# Patient Record
Sex: Female | Born: 1945
Health system: Southern US, Community
[De-identification: ages and names within clinical notes are randomized; demographics above are authoritative.]

## PROBLEM LIST (undated history)

## (undated) DIAGNOSIS — I10 Essential (primary) hypertension: Secondary | ICD-10-CM

## (undated) DIAGNOSIS — E119 Type 2 diabetes mellitus without complications: Secondary | ICD-10-CM

## (undated) DIAGNOSIS — R0600 Dyspnea, unspecified: Secondary | ICD-10-CM

## (undated) HISTORY — PX: NO PAST SURGERIES: SHX2092

---

## 1999-06-23 ENCOUNTER — Emergency Department (HOSPITAL_COMMUNITY): Admission: EM | Admit: 1999-06-23 | Discharge: 1999-06-23 | Payer: Self-pay | Admitting: Emergency Medicine

## 1999-06-23 ENCOUNTER — Encounter: Payer: Self-pay | Admitting: Emergency Medicine

## 2006-10-07 ENCOUNTER — Emergency Department (HOSPITAL_COMMUNITY): Admission: EM | Admit: 2006-10-07 | Discharge: 2006-10-07 | Payer: Self-pay | Admitting: Emergency Medicine

## 2008-04-07 ENCOUNTER — Observation Stay (HOSPITAL_COMMUNITY): Admission: EM | Admit: 2008-04-07 | Discharge: 2008-04-08 | Payer: Self-pay | Admitting: Family Medicine

## 2008-04-07 ENCOUNTER — Ambulatory Visit: Payer: Self-pay | Admitting: Family Medicine

## 2008-04-07 LAB — CONVERTED CEMR LAB
Cholesterol: 151 mg/dL
HDL: 24 mg/dL
Hgb A1c MFr Bld: 13.8 %
LDL Cholesterol: 94 mg/dL
Triglyceride fasting, serum: 165 mg/dL

## 2008-04-17 ENCOUNTER — Encounter: Payer: Self-pay | Admitting: Family Medicine

## 2008-04-17 ENCOUNTER — Ambulatory Visit: Payer: Self-pay | Admitting: Family Medicine

## 2008-04-17 ENCOUNTER — Encounter: Payer: Self-pay | Admitting: *Deleted

## 2008-04-17 DIAGNOSIS — F17200 Nicotine dependence, unspecified, uncomplicated: Secondary | ICD-10-CM | POA: Insufficient documentation

## 2008-04-17 DIAGNOSIS — E119 Type 2 diabetes mellitus without complications: Secondary | ICD-10-CM | POA: Insufficient documentation

## 2008-04-19 ENCOUNTER — Telehealth: Payer: Self-pay | Admitting: Family Medicine

## 2008-05-08 ENCOUNTER — Ambulatory Visit: Payer: Self-pay | Admitting: Family Medicine

## 2008-05-08 LAB — CONVERTED CEMR LAB: Microalbumin U total vol: NEGATIVE mg/L

## 2010-03-23 ENCOUNTER — Encounter: Payer: Self-pay | Admitting: Family Medicine

## 2010-11-12 NOTE — Miscellaneous (Signed)
Summary: chart update  Clinical Lists Changes  Problems: Removed problem of DISTURBANCE OF SKIN SENSATION (ICD-782.0) Removed problem of BLURRED VISION (ICD-368.8) Observations: Added new observation of PRIMARY MD: Myrtie Soman  MD (03/23/2010 12:28)

## 2011-02-23 NOTE — H&P (Signed)
[name], [name]              ACCOUNT NO.:  [id number]   MEDICAL RECORD NO.:  [id number]          PATIENT TYPE:  OBV   LOCATION:  5029                         FACILITY:  [facility]   PHYSICIAN:  Nestor Ramp, MD        DATE OF BIRTH:  09/26/1946   DATE OF ADMISSION:  04/07/2008  DATE OF DISCHARGE:                              HISTORY & PHYSICAL   PRIMARY CARE PHYSICIAN:  None.   CHIEF COMPLAINT:  Increased thirst, high sugars.   HISTORY OF PRESENT ILLNESS:  This is a 65 year old female with no known  medical history who presents with increased thirstiness and dry mouth  for the past couple of days.  The patient said she was at a family  reunion and her sister checked her blood sugar and it was too high to  read.  Therefore, she came to the emergency room.  Of note, over the  past couple of days to weeks, the patient has not been eating well  because she said everything tastes starchy.  She said all her food and  drink tastes the same.  She has also had a burning sensation in her  mouth.  She had increased urination going about every 30 minutes.  She  also complains of blurry vision this week where her reading glasses  actually made the blurry vision worse.  She denies abdominal pain.  Denies constipation.  Denies nausea, although she did vomit a few days  ago, but this is now resolved.   PAST MEDICAL HISTORY:  None.   ALLERGIES:  No known drug allergies.   MEDICATIONS:  None.   SOCIAL HISTORY:  The patient lives in [place] with her son.  She  works at [employer] on [address].  She smokes half a pack a day.  She drinks  occasional alcohol, last 2 days ago.  She denies drug use.   FAMILY HISTORY:  She said her brothers and sisters have diabetes.  She  is not sure if her parents have diabetes.  Her father has hypertension  and mild heart problems.   REVIEW OF SYSTEMS:  As in HPI with the following additions.  Denies  fevers, nausea, or sore throat.  There is no chest pain,  edema,  palpitations, dyspnea, wheezing, diarrhea, bright red blood per rectum,  melena, dysuria, rash, swelling, and numbness.  Does complain of  appetite changes,  fatigue, headache, a sore esophagus, cough, vomiting,  and visual changes.  No abdominal pain.   PHYSICAL EXAMINATION:  VITAL SIGNS:  Temperature 98.2, heart rate 63-95,  respiratory rate 20, blood pressure 140/86 and 121/71 postop, and 100%  on room air.  GENERAL:  Not in acute distress, well appearing.  HEENT:  Pupils equal, round, and reactive to light and accommodation,  positive arcus senilis, extraocular muscles intact, mucous membranes are  somewhat dry.  She has no erythema over throat.  NECK:  Negative JVD.  Negative bruits.  Negative masses.  CARDIOVASCULAR:  Regular rate and rhythm.  No rubs, gallop, or murmurs.  LUNGS:  Clear to auscultation bilaterally.  She has the patient's please  add the  ABDOMEN:  Soft, nontender, obese, and normal bowel sounds.  BACK:  Nontender.  EXTREMITIES:  No edema.  A 2+ DP pulses bilaterally.  NEURO:  Cranial nerves II through XII intact, oriented x3, and  cerebellar function intact.  MUSCULOSKELETAL:  Normal range of movement.  No effusions.  SKIN:  No lesions.   LABS AND STUDIES:  iSTAT shows sodium 127, potassium 5.2, chloride 93,  bicarb 28, BUN 19, creatinine 1.4, and glucose greater than 700 with an  anion gap of 8.  Repeat BMET shows sodium 136, potassium 3.9, chloride  97, bicarb 27, BUN 14, creatinine 1.07, and glucose 444 with an anion  gap of 12.  Urinalysis shows 15 ketones greater than 1000 glucose.  Specific gravity less than 1.005.  No nitrite.  No leucocytes.  EKG  shows normal sinus rhythm with inverted T-waves in V1 only.   ASSESSMENT:  A 65 year old female with hyperglycemia.  1. Hyperglycemia:  This is a new diagnosis of diabetes and this is      based on random glucose greater than 200.  She does not have      acidosis and has a normal anion gap which  indicates this is likely      a chronic problem.  Her capillary blood gas has already decreased      significantly about 300 points on intravenous fluids and the      Glucommander while in the emergency room.  Given the patient is      insulin naive, i will discontinue the glucose stabilizer 2 hours      after giving her 15 units of Lantus.  Hopefully, the patient can      transition the p.o. metformin in the morning 500 b.i.d.  We will      cover her with sliding-scale insulin in the meantime.  Initially,      we will use D5 normal saline until the patient was taken p.o. and      we will switch to normal saline at 125 mL an hour.  If the patient      does require insulin, it would be about 0.5 units per kg given that      she is insulin naive.  We are going to risk stratify the patient on      a fasting lipid panel, q.2 h. capillary blood gases until stable.  2. Tobacco abuse:  The patient desires to have a negative patch 14 mg;      however, at this point she declined this.  3. Prophylaxis.  At prolonged hospital stay, we can start her on      proton pump inhibitor and place her on sequential compression      devices.   DISPOSITION:  Lately discharge within 24 hours if her CBGs are  controlled.       Johney Maine, M.D.  Electronically Signed      Nestor Ramp, MD  Electronically Signed    JT/MEDQ  D:  04/07/2008  T:  04/08/2008  Job:  [id number]

## 2011-02-23 NOTE — Discharge Summary (Signed)
Bianca [name], Bianca [name]              ACCOUNT NO.:  [id number]   MEDICAL RECORD NO.:  [id number]          PATIENT TYPE:  OBV   LOCATION:  5029                         FACILITY:  [facility]   PHYSICIAN:  Myrtie Soman, MD     DATE OF BIRTH:  01-14-1946   DATE OF ADMISSION:  04/07/2008  DATE OF DISCHARGE:  04/08/2008                               DISCHARGE SUMMARY   REASON FOR HOSPITALIZATION:  Hyperglycemia.   PRIMARY CARE Jessicalynn Deshong:  The patient has no primary care Micco Bourbeau.   DISCHARGE DIAGNOSES:  1. New diagnosis of diabetes.  2. Dyslipidemia.   DISCHARGE MEDICATIONS:  1. Metformin 500 mg p.o. b.i.d.  2. Amaryl 2 mg p.o. daily.   DISCHARGE INSTRUCTIONS:  1. The patient is to take medications as mentioned previously.  2. The patient is to follow up with diabetic teaching which she      received in the hospital.  3. The patient is to follow up with [facility] as a      new patient on April 17, 2008 with Dr. Rexene Alberts.  4. The patient is to follow up at [facility] as      arranged by diabetic teachers in the hospital.   CONSULTATIONS:  Diabetic teaching.   PROCEDURES:  None.   SIGNIFICANT FINDINGS ON ADMISSION:  The patient's glucose by i-STAT and  Chem-8 showed glucose greater than 771 and was low at 127, potassium  5.2, BUN of 19, creatinine 1.4, and chloride was 93.  Urine showed 15  ketones greater than 1000 of glucose and specific gravity less than  1.005.  Blood ketones performed on the day of admission were negative.  Repeat basic metabolic panel after initiation of anti-glycemic therapy  showed sodium of 136, potassium 3.9, chloride 97, CO2 27, glucose of  444, BUN of 14, and creatinine 1.07.  Lipid profile performed during  this hospitalization showed a total cholesterol of 151, triglycerides  165, HDL of 24, and LDL of 94.  Hemoglobin A1c performed during this  hospitalization was elevated to 13.8.  Basic metabolic panel performed  on the  date of discharge shows sodium of 136, potassium of 3.3, chloride  of 105, CO2 of 27, glucose of 96, BUN of 13, and creatinine 0.86.   BRIEF HOSPITAL COURSE:  Briefly, the patient is a 65 year old female  with no prior medical history, no primary care Laporchia Nakajima, and no medical  diagnoses who presented to the emergency department with polydipsia and  polyuria.  The patient had had a blood sugar measured by a friend at  home and found it to be elevated and subsequently presented to the  Emergency Department.  On evaluation at the Emergency Department,  glucose was found to be elevated at greater than 700, Glucommander  therapy was started in the Emergency Department under their care and  sugars decreased to 444 by the time the family practice teaching service  took over her care.  The patient was transitioned from Glucommander  insulin therapy to subcutaneous Lantus and oral anti-hyperglycemic  therapy with metformin 500 mg p.o. b.i.d.  She was  also started on a  sensitive sliding scale insulin as she is likely insulin naive.  The  patient had a good response in her blood sugars with incremental  decreasing of these values until the day of discharge, 1:00 a.m. glucose  was in the 90s.  At that time, the patient was given no further insulin  dosing.  She was tolerating oral intake without difficulty on hospital  day #2 and her  blood sugars rebounded from 92 to the 130s.  Her IV  fluids were discontinued and the patient was monitored further morning  and in the afternoon.  She had no further complications and stable vital  signs throughout her entire hospital course.  She had no abnormalities  in her bicarb and no indication of metabolic acidosis throughout her  entire hospital stay.  Given that the patient was tolerating oral intake  without difficulty and had stabilization of her blood sugars and had  been initiated on diabetic regimen.  She was discharged to home on April 08, 2008 with  close followup with [facility].  Prior to  discharge, the patient received diabetic teaching and further  consultation should have been set up with [facility]  subsequent to discharge.   DISCHARGE CONDITION:  Stable.   DISPOSITION:  The patient is discharged to home.   ISSUES FOR FOLLOWUP:  The patient's hemoglobin A1c was found to be 13 on  this hospitalization.  Adherence to diabetic regimen should be evaluated  and further teaching should be undertaken as indicated.  The patient's  fasting lipid panel was not markedly abnormal.  Given the new diagnosis  of diabetes, however, the patient's LDL goal is now less than 70.  Triglycerides were also found to be high and HDL was also found to be  low.  LDL should be pursued as the initial target goal with subsequent  alteration in triglycerides and HDL after this goal was reached.  Hemoglobin A1c should be monitored as an indication for efficacy  diabetic therapy.  Given the magnitude of elevation in glucose on  presentation is very likely that the patient will require insulin  therapy in the very near future.  This should be assessed at followup  and a low threshold for initiating insulin therapy should be held.  Further augmentation of complete diabetic regimen including  consideration of ACE inhibitor, anti-lipid therapy, and other modalities  should be considered, including diabetic foot testing.      Myrtie Soman, MD  Electronically Signed     TE/MEDQ  D:  04/08/2008  T:  04/09/2008  Job:  [id number]

## 2011-07-08 LAB — BASIC METABOLIC PANEL WITH GFR
BUN: 13
CO2: 27
Calcium: 8.3 — ABNORMAL LOW
Chloride: 105
Creatinine, Ser: 0.86
GFR calc non Af Amer: >60
Glucose, Bld: 96
Potassium: 3.3 — ABNORMAL LOW
Sodium: 136

## 2011-07-08 LAB — LIPID PANEL
HDL: 24 — ABNORMAL LOW
Total CHOL/HDL Ratio: 6.3
Triglycerides: 165 — ABNORMAL HIGH
VLDL: 33

## 2011-07-08 LAB — POCT URINALYSIS DIP (DEVICE)
Bilirubin Urine: NEGATIVE
Glucose, UA: >=1000 — AB
Hgb urine dipstick: NEGATIVE
Ketones, ur: 15 — AB
Nitrite: NEGATIVE
Specific Gravity, Urine: <=1.005

## 2011-07-08 LAB — POCT I-STAT, CHEM 8
Chloride: 93 — ABNORMAL LOW
Creatinine, Ser: 1.4 — ABNORMAL HIGH
Glucose, Bld: >700
HCT: 49 — ABNORMAL HIGH
Potassium: 5.2 — ABNORMAL HIGH

## 2011-07-08 LAB — HEMOGLOBIN A1C
Hgb A1c MFr Bld: 13.8 — ABNORMAL HIGH
Mean Plasma Glucose: 414

## 2011-07-08 LAB — BASIC METABOLIC PANEL
GFR calc non Af Amer: 52 — ABNORMAL LOW
Glucose, Bld: 444 — ABNORMAL HIGH
Potassium: 3.9
Sodium: 136

## 2011-11-03 DIAGNOSIS — J209 Acute bronchitis, unspecified: Secondary | ICD-10-CM | POA: Diagnosis not present

## 2011-11-03 DIAGNOSIS — E785 Hyperlipidemia, unspecified: Secondary | ICD-10-CM | POA: Diagnosis not present

## 2011-11-03 DIAGNOSIS — I10 Essential (primary) hypertension: Secondary | ICD-10-CM | POA: Diagnosis not present

## 2011-11-03 DIAGNOSIS — I509 Heart failure, unspecified: Secondary | ICD-10-CM | POA: Diagnosis not present

## 2012-12-03 DIAGNOSIS — R079 Chest pain, unspecified: Secondary | ICD-10-CM | POA: Diagnosis not present

## 2013-02-05 DIAGNOSIS — E119 Type 2 diabetes mellitus without complications: Secondary | ICD-10-CM | POA: Diagnosis not present

## 2013-02-05 DIAGNOSIS — R5381 Other malaise: Secondary | ICD-10-CM | POA: Diagnosis not present

## 2013-02-05 DIAGNOSIS — J039 Acute tonsillitis, unspecified: Secondary | ICD-10-CM | POA: Diagnosis not present

## 2013-02-05 DIAGNOSIS — I10 Essential (primary) hypertension: Secondary | ICD-10-CM | POA: Diagnosis not present

## 2013-02-06 DIAGNOSIS — E119 Type 2 diabetes mellitus without complications: Secondary | ICD-10-CM | POA: Diagnosis not present

## 2013-02-06 DIAGNOSIS — R5383 Other fatigue: Secondary | ICD-10-CM | POA: Diagnosis not present

## 2013-02-06 DIAGNOSIS — I10 Essential (primary) hypertension: Secondary | ICD-10-CM | POA: Diagnosis not present

## 2013-02-06 DIAGNOSIS — E785 Hyperlipidemia, unspecified: Secondary | ICD-10-CM | POA: Diagnosis not present

## 2013-02-06 DIAGNOSIS — J029 Acute pharyngitis, unspecified: Secondary | ICD-10-CM | POA: Diagnosis not present

## 2013-06-14 DIAGNOSIS — E119 Type 2 diabetes mellitus without complications: Secondary | ICD-10-CM | POA: Diagnosis not present

## 2013-06-14 DIAGNOSIS — I10 Essential (primary) hypertension: Secondary | ICD-10-CM | POA: Diagnosis not present

## 2014-01-18 ENCOUNTER — Encounter (HOSPITAL_BASED_OUTPATIENT_CLINIC_OR_DEPARTMENT_OTHER): Payer: Self-pay | Admitting: Emergency Medicine

## 2014-01-18 ENCOUNTER — Emergency Department (HOSPITAL_BASED_OUTPATIENT_CLINIC_OR_DEPARTMENT_OTHER)
Admission: EM | Admit: 2014-01-18 | Discharge: 2014-01-18 | Disposition: A | Discharge status: Home / Self Care | Payer: Medicare Other | Attending: Emergency Medicine | Admitting: Emergency Medicine

## 2014-01-18 DIAGNOSIS — Z7982 Long term (current) use of aspirin: Secondary | ICD-10-CM | POA: Diagnosis not present

## 2014-01-18 DIAGNOSIS — B3789 Other sites of candidiasis: Secondary | ICD-10-CM | POA: Insufficient documentation

## 2014-01-18 DIAGNOSIS — F17200 Nicotine dependence, unspecified, uncomplicated: Secondary | ICD-10-CM | POA: Insufficient documentation

## 2014-01-18 DIAGNOSIS — I10 Essential (primary) hypertension: Secondary | ICD-10-CM | POA: Insufficient documentation

## 2014-01-18 DIAGNOSIS — Z79899 Other long term (current) drug therapy: Secondary | ICD-10-CM | POA: Diagnosis not present

## 2014-01-18 DIAGNOSIS — B356 Tinea cruris: Secondary | ICD-10-CM | POA: Diagnosis not present

## 2014-01-18 DIAGNOSIS — E119 Type 2 diabetes mellitus without complications: Secondary | ICD-10-CM | POA: Insufficient documentation

## 2014-01-18 HISTORY — DX: Type 2 diabetes mellitus without complications: E11.9

## 2014-01-18 HISTORY — DX: Essential (primary) hypertension: I10

## 2014-01-18 LAB — CBG MONITORING, ED: GLUCOSE-CAPILLARY: 245 mg/dL — AB (ref 70–99)

## 2014-01-18 MED ORDER — FREESTYLE SYSTEM KIT: 1.0000 | PACK | Freq: Two times a day (BID) | Status: DC

## 2014-01-18 MED ORDER — CLOTRIMAZOLE 1 % EX CREA: TOPICAL_CREAM | CUTANEOUS | Status: DC

## 2014-01-18 NOTE — ED Provider Notes (Signed)
Medical screening examination/treatment/procedure(s) were performed by non-physician practitioner and as supervising physician I was immediately available for consultation/collaboration.   Celene KrasJon R Calum Cormier, MD 01/18/14 [phone]

## 2014-01-18 NOTE — ED Notes (Signed)
Rash on her vagina. Hx of same.

## 2014-01-18 NOTE — ED Provider Notes (Signed)
CSN: [id number]     Arrival date & time 01/18/14  1754 History   First MD Initiated Contact with Patient 01/18/14 1821     Chief Complaint  Patient presents with  . Rash     (Consider location/radiation/quality/duration/timing/severity/associated sxs/prior Treatment) Patient is a 68 y.o. female presenting with rash. The history is provided by the patient. No language interpreter was used.  Rash Location:  Ano-genital Ano-genital rash location:  Vagina Quality: itchiness and scaling   Severity:  Moderate Associated symptoms: no abdominal pain, no fever and no myalgias   Associated symptoms comment:  Recurrent rash, present most recently for the past 3 months. Getting worse. It causes itching. It is limited to lower abdominal and groin area.    Past Medical History  Diagnosis Date  . Diabetes mellitus without complication   . Hypertension    History reviewed. No pertinent past surgical history. No family history on file. History  Substance Use Topics  . Smoking status: Current Every Day Smoker -- 0.50 packs/day    Types: Cigarettes  . Smokeless tobacco: Not on file  . Alcohol Use: No   OB History   Grav Para Term Preterm Abortions TAB SAB Ect Mult Living                 Review of Systems  Constitutional: Negative for fever.  Gastrointestinal: Negative for abdominal pain.  Musculoskeletal: Negative for myalgias.  Skin: Positive for rash.      Allergies  Review of patient's allergies indicates no known allergies.  Home Medications   Current Outpatient Rx  Name  Route  Sig  Dispense  Refill  . aspirin 81 MG EC tablet   Oral   Take 81 mg by mouth daily.           Marland Kitchen. glimepiride (AMARYL) 2 MG tablet   Oral   Take 2 mg by mouth daily.           . metFORMIN (GLUCOPHAGE) 1000 MG tablet   Oral   Take 1,000 mg by mouth 2 (two) times daily.            BP 137/75  Pulse 87  Temp(Src) 98.5 F (36.9 C) (Oral)  Resp 20  Ht 5\' 6"  (1.676 m)  Wt 156 lb 8 oz  (70.988 kg)  BMI 25.27 kg/m2  SpO2 99% Physical Exam  Constitutional: She is oriented to person, place, and time. She appears well-developed and well-nourished.  Neck: Normal range of motion.  Pulmonary/Chest: Effort normal.  Neurological: She is alert and oriented to person, place, and time.  Skin: Skin is warm and dry.  Plaque type rash in inguinal folds bilateral groin. No weeping, redness or purulence.     ED Course  Procedures (including critical care time) Labs Review Labs Reviewed  CBG MONITORING, ED   Imaging Review No results found.   EKG Interpretation None      MDM   Final diagnoses:  None    1. Candidal rash, groin  Uncomplicated recurrent yeast rash.     Arnoldo HookerShari A Kashus Karlen, PA-C 01/18/14 1849

## 2014-01-18 NOTE — Discharge Instructions (Signed)
Candida Infection, Adult A candida infection (also called yeast, fungus and Monilia infection) is an overgrowth of yeast that can occur anywhere on the body. A yeast infection commonly occurs in warm, moist body areas. Usually, the infection remains localized but can spread to become a systemic infection. A yeast infection may be a sign of a more severe disease such as diabetes, leukemia, or AIDS. A yeast infection can occur in both men and women. In women, Candida vaginitis is a vaginal infection. It is one of the most common causes of vaginitis. Men usually do not have symptoms or know they have an infection until other problems develop. Men may find out they have a yeast infection because their sex partner has a yeast infection. Uncircumcised men are more likely to get a yeast infection than circumcised men. This is because the uncircumcised glans is not exposed to air and does not remain as dry as that of a circumcised glans. Older adults may develop yeast infections around dentures. CAUSES  Women  Antibiotics.  Steroid medication taken for a long time.  Being overweight (obese).  Diabetes.  Poor immune condition.  Certain serious medical conditions.  Immune suppressive medications for organ transplant patients.  Chemotherapy.  Pregnancy.  Menstration.  Stress and fatigue.  Intravenous drug use.  Oral contraceptives.  Wearing tight-fitting clothes in the crotch area.  Catching it from a sex partner who has a yeast infection.  Spermicide.  Intravenous, urinary, or other catheters. Men  Catching it from a sex partner who has a yeast infection.  Having oral or anal sex with a person who has the infection.  Spermicide.  Diabetes.  Antibiotics.  Poor immune system.  Medications that suppress the immune system.  Intravenous drug use.  Intravenous, urinary, or other catheters. SYMPTOMS  Women  Thick, white vaginal discharge.  Vaginal itching.  Redness and  swelling in and around the vagina.  Irritation of the lips of the vagina and perineum.  Blisters on the vaginal lips and perineum.  Painful sexual intercourse.  Low blood sugar (hypoglycemia).  Painful urination.  Bladder infections.  Intestinal problems such as constipation, indigestion, bad breath, bloating, increase in gas, diarrhea, or loose stools. Men  Men may develop intestinal problems such as constipation, indigestion, bad breath, bloating, increase in gas, diarrhea, or loose stools.  Dry, cracked skin on the penis with itching or discomfort.  Jock itch.  Dry, flaky skin.  Athlete's foot.  Hypoglycemia. DIAGNOSIS  Women  A history and an exam are performed.  The discharge may be examined under a microscope.  A culture may be taken of the discharge. Men  A history and an exam are performed.  Any discharge from the penis or areas of cracked skin will be looked at under the microscope and cultured.  Stool samples may be cultured. TREATMENT  Women  Vaginal antifungal suppositories and creams.  Medicated creams to decrease irritation and itching on the outside of the vagina.  Warm compresses to the perineal area to decrease swelling and discomfort.  Oral antifungal medications.  Medicated vaginal suppositories or cream for repeated or recurrent infections.  Wash and dry the irritation areas before applying the cream.  Eating yogurt with lactobacillus may help with prevention and treatment.  Sometimes painting the vagina with gentian violet solution may help if creams and suppositories do not work. Men  Antifungal creams and oral antifungal medications.  Sometimes treatment must continue for 30 days after the symptoms go away to prevent recurrence. HOME CARE   INSTRUCTIONS  Women  Use cotton underwear and avoid tight-fitting clothing.  Avoid colored, scented toilet paper and deodorant tampons or pads.  Do not douche.  Keep your diabetes  under control.  Finish all the prescribed medications.  Keep your skin clean and dry.  Consume milk or yogurt with lactobacillus active culture regularly. If you get frequent yeast infections and think that is what the infection is, there are over-the-counter medications that you can get. If the infection does not show healing in 3 days, talk to your caregiver.  Tell your sex partner you have a yeast infection. Your partner may need treatment also, especially if your infection does not clear up or recurs. Men  Keep your skin clean and dry.  Keep your diabetes under control.  Finish all prescribed medications.  Tell your sex partner that you have a yeast infection so they can be treated if necessary. SEEK MEDICAL CARE IF:   Your symptoms do not clear up or worsen in one week after treatment.  You have an oral temperature above 102 F (38.9 C).  You have trouble swallowing or eating for a prolonged time.  You develop blisters on and around your vagina.  You develop vaginal bleeding and it is not your menstrual period.  You develop abdominal pain.  You develop intestinal problems as mentioned above.  You get weak or lightheaded.  You have painful or increased urination.  You have pain during sexual intercourse. MAKE SURE YOU:   Understand these instructions.  Will watch your condition.  Will get help right away if you are not doing well or get worse. Document Released: 11/04/2004 Document Revised: 12/20/2011 Document Reviewed: 02/16/2010 [employer] Patient Information 2014 [employer].  

## 2014-03-26 DIAGNOSIS — B3731 Acute candidiasis of vulva and vagina: Secondary | ICD-10-CM | POA: Diagnosis not present

## 2014-03-26 DIAGNOSIS — B373 Candidiasis of vulva and vagina: Secondary | ICD-10-CM | POA: Diagnosis not present

## 2014-03-26 DIAGNOSIS — J209 Acute bronchitis, unspecified: Secondary | ICD-10-CM | POA: Diagnosis not present

## 2014-03-26 DIAGNOSIS — I10 Essential (primary) hypertension: Secondary | ICD-10-CM | POA: Diagnosis not present

## 2014-03-26 DIAGNOSIS — E119 Type 2 diabetes mellitus without complications: Secondary | ICD-10-CM | POA: Diagnosis not present

## 2014-03-26 DIAGNOSIS — J449 Chronic obstructive pulmonary disease, unspecified: Secondary | ICD-10-CM | POA: Diagnosis not present

## 2014-12-27 ENCOUNTER — Encounter (HOSPITAL_COMMUNITY): Payer: Self-pay | Admitting: *Deleted

## 2014-12-27 ENCOUNTER — Emergency Department (HOSPITAL_COMMUNITY): Payer: Medicare Other

## 2014-12-27 ENCOUNTER — Emergency Department (HOSPITAL_COMMUNITY)
Admission: EM | Admit: 2014-12-27 | Discharge: 2014-12-27 | Disposition: A | Discharge status: Home / Self Care | Payer: Medicare Other | Attending: Emergency Medicine | Admitting: Emergency Medicine

## 2014-12-27 DIAGNOSIS — S92402A Displaced unspecified fracture of left great toe, initial encounter for closed fracture: Secondary | ICD-10-CM | POA: Diagnosis not present

## 2014-12-27 DIAGNOSIS — W228XXA Striking against or struck by other objects, initial encounter: Secondary | ICD-10-CM | POA: Diagnosis not present

## 2014-12-27 DIAGNOSIS — Y998 Other external cause status: Secondary | ICD-10-CM | POA: Diagnosis not present

## 2014-12-27 DIAGNOSIS — Z7982 Long term (current) use of aspirin: Secondary | ICD-10-CM | POA: Insufficient documentation

## 2014-12-27 DIAGNOSIS — S92415A Nondisplaced fracture of proximal phalanx of left great toe, initial encounter for closed fracture: Secondary | ICD-10-CM | POA: Diagnosis not present

## 2014-12-27 DIAGNOSIS — I10 Essential (primary) hypertension: Secondary | ICD-10-CM | POA: Diagnosis not present

## 2014-12-27 DIAGNOSIS — S92912A Unspecified fracture of left toe(s), initial encounter for closed fracture: Secondary | ICD-10-CM

## 2014-12-27 DIAGNOSIS — E119 Type 2 diabetes mellitus without complications: Secondary | ICD-10-CM | POA: Diagnosis not present

## 2014-12-27 DIAGNOSIS — Z79899 Other long term (current) drug therapy: Secondary | ICD-10-CM | POA: Diagnosis not present

## 2014-12-27 DIAGNOSIS — M79675 Pain in left toe(s): Secondary | ICD-10-CM | POA: Diagnosis not present

## 2014-12-27 DIAGNOSIS — Y9389 Activity, other specified: Secondary | ICD-10-CM | POA: Diagnosis not present

## 2014-12-27 DIAGNOSIS — Z720 Tobacco use: Secondary | ICD-10-CM | POA: Diagnosis not present

## 2014-12-27 DIAGNOSIS — S99922A Unspecified injury of left foot, initial encounter: Secondary | ICD-10-CM | POA: Diagnosis present

## 2014-12-27 DIAGNOSIS — Y9289 Other specified places as the place of occurrence of the external cause: Secondary | ICD-10-CM | POA: Insufficient documentation

## 2014-12-27 MED ORDER — HYDROCODONE-ACETAMINOPHEN 5-325 MG PO TABS: 1.0000 | ORAL_TABLET | ORAL | Status: DC | PRN

## 2014-12-27 MED ORDER — METFORMIN HCL 1000 MG PO TABS: 1000.0000 mg | ORAL_TABLET | Freq: Two times a day (BID) | ORAL | Status: DC

## 2014-12-27 NOTE — ED Notes (Signed)
thge pt is c/o lt great and 2nd toe pain since a roller skate rolled over it last pom.  She also wants a refill for her  Diabetic med

## 2014-12-27 NOTE — Discharge Instructions (Signed)

## 2014-12-27 NOTE — ED Provider Notes (Signed)
CSN: [id number]     Arrival date & time 12/27/14  2037 History   This chart was scribed for non-physician practitioner Lorre Munroe, PA-C working with Tanna Furry, MD by Hilda Lias, ED Scribe. This patient was seen in room TR05C/TR05C and the patient's care was started at 9:37 PM.    Chief Complaint  Patient presents with  . Foot Pain     The history is provided by the patient. No language interpreter was used.     HPI Comments: Bianca [name] is a 69 y.o. female with DM who presents to the Emergency Department complaining of constant left great and 2nd toe pain after her left foot was run over by a roller skate last night. Pt states that it was painful to ambulate immediately afterwards, and has been since the incident occurred. She has not taken anything to alleviate the symptoms.   Past Medical History  Diagnosis Date  . Diabetes mellitus without complication   . Hypertension    No past surgical history on file. No family history on file. History  Substance Use Topics  . Smoking status: Current Every Day Smoker -- 0.50 packs/day    Types: Cigarettes  . Smokeless tobacco: Not on file  . Alcohol Use: No   OB History    No data available     Review of Systems  Constitutional: Negative for fever and chills.  Respiratory: Negative for shortness of breath.   Cardiovascular: Negative for chest pain.  Gastrointestinal: Negative for nausea, vomiting, diarrhea and constipation.  Genitourinary: Negative for dysuria.  Musculoskeletal: Positive for arthralgias.      Allergies  Review of patient's allergies indicates no known allergies.  Home Medications   Prior to Admission medications   Medication Sig Start Date End Date Taking? Authorizing Provider  aspirin 81 MG EC tablet Take 81 mg by mouth daily.      Historical Provider, MD  clotrimazole (LOTRIMIN) 1 % cream Apply to affected area 2 times daily 01/18/14   Charlann Lange, PA-C  glimepiride (AMARYL) 2 MG tablet Take  2 mg by mouth daily.      Historical Provider, MD  glucose monitoring kit (FREESTYLE) monitoring kit 1 each by Does not apply route 2 (two) times daily. 01/18/14   Charlann Lange, PA-C  metFORMIN (GLUCOPHAGE) 1000 MG tablet Take 1,000 mg by mouth 2 (two) times daily.      Historical Provider, MD   BP 167/76 mmHg  Pulse 95  Temp(Src) 98.2 F (36.8 C)  Resp 14  SpO2 98% Physical Exam  Constitutional: She is oriented to person, place, and time. She appears well-developed and well-nourished.  HENT:  Head: Normocephalic and atraumatic.  Cardiovascular: Normal rate.   Pulmonary/Chest: Effort normal.  Abdominal: She exhibits no distension.  Musculoskeletal:  Left great toe moderately tender to palpation, range of motion strength 5/5, no bony abnormality or deformity, no open wound  Neurological: She is alert and oriented to person, place, and time.  Skin: Skin is warm and dry.  Psychiatric: She has a normal mood and affect.  Nursing note and vitals reviewed.   ED Course  Procedures (including critical care time)  DIAGNOSTIC STUDIES: Oxygen Saturation is 98% on RA, normal by my interpretation.    COORDINATION OF CARE: 9:40 PM Discussed treatment plan with pt at bedside and pt agreed to plan.   Labs Review Labs Reviewed - No data to display  Imaging Review Dg Foot Complete Left  12/27/2014   CLINICAL DATA:  Left  great toe pain, recent trauma.  EXAM: LEFT FOOT - COMPLETE 3+ VIEW  COMPARISON:  None.  FINDINGS: Subtle linear lucency at the base of the first proximal phalanx medially. No displaced fracture. No dislocation. Intact Lisfranc joint. Posterior calcaneal enthesophyte. No overt soft tissue swelling.  IMPRESSION: Subtle linear lucency through the base of the first proximal phalanx is favored to reflect projection artifact. Correlate for point tenderness as a nondisplaced fracture is not excluded.   Electronically Signed   By: Carlos Levering M.D.   On: 12/27/2014 21:32     EKG  Interpretation None      MDM   Final diagnoses:  Toe fracture, left, closed, initial encounter    Patient with suspected small nondisplaced great toe fracture. Will treat with postop shoe. Will treat with pain medicine. Recommend primary care and orthopedic follow-up. Patient will need close follow-up because she is diabetic. Patient understands and agrees to plan. She is stable and ready for discharge.  I personally performed the services described in this documentation, which was scribed in my presence. The recorded information has been reviewed and is accurate.     Montine Circle, PA-C 12/27/14 2149  Tanna Furry, MD 12/28/14 2220

## 2015-01-22 DIAGNOSIS — M79672 Pain in left foot: Secondary | ICD-10-CM | POA: Diagnosis not present

## 2015-01-30 ENCOUNTER — Emergency Department (HOSPITAL_COMMUNITY)
Admission: EM | Admit: 2015-01-30 | Discharge: 2015-01-31 | Disposition: A | Discharge status: Home / Self Care | Payer: Medicare Other | Attending: Emergency Medicine | Admitting: Emergency Medicine

## 2015-01-30 ENCOUNTER — Encounter (HOSPITAL_COMMUNITY): Payer: Self-pay | Admitting: Emergency Medicine

## 2015-01-30 DIAGNOSIS — S8011XA Contusion of right lower leg, initial encounter: Secondary | ICD-10-CM | POA: Insufficient documentation

## 2015-01-30 DIAGNOSIS — Z720 Tobacco use: Secondary | ICD-10-CM | POA: Diagnosis not present

## 2015-01-30 DIAGNOSIS — Y929 Unspecified place or not applicable: Secondary | ICD-10-CM | POA: Diagnosis not present

## 2015-01-30 DIAGNOSIS — Y939 Activity, unspecified: Secondary | ICD-10-CM | POA: Insufficient documentation

## 2015-01-30 DIAGNOSIS — W228XXA Striking against or struck by other objects, initial encounter: Secondary | ICD-10-CM | POA: Insufficient documentation

## 2015-01-30 DIAGNOSIS — Y999 Unspecified external cause status: Secondary | ICD-10-CM | POA: Insufficient documentation

## 2015-01-30 DIAGNOSIS — I10 Essential (primary) hypertension: Secondary | ICD-10-CM | POA: Diagnosis not present

## 2015-01-30 DIAGNOSIS — E119 Type 2 diabetes mellitus without complications: Secondary | ICD-10-CM | POA: Diagnosis not present

## 2015-01-30 DIAGNOSIS — S8991XA Unspecified injury of right lower leg, initial encounter: Secondary | ICD-10-CM | POA: Diagnosis present

## 2015-01-30 DIAGNOSIS — F17210 Nicotine dependence, cigarettes, uncomplicated: Secondary | ICD-10-CM | POA: Diagnosis not present

## 2015-01-30 DIAGNOSIS — Z79899 Other long term (current) drug therapy: Secondary | ICD-10-CM | POA: Diagnosis not present

## 2015-01-30 NOTE — ED Notes (Signed)
Pt. accidentally hit by a roller skate at her right shin this evening , presents with pain , mild swelling/bruise at right shin . Ambulatory / hypertensive at triage .

## 2015-01-31 MED ORDER — ASPIRIN 81 MG PO CHEW: 81.0000 mg | CHEWABLE_TABLET | Freq: Every day | ORAL | Status: DC

## 2015-01-31 MED ORDER — ADULT BLOOD PRESSURE CUFF LG KIT: PACK | Status: DC

## 2015-01-31 MED ORDER — TRAMADOL HCL 50 MG PO TABS
50.0000 mg | ORAL_TABLET | Freq: Once | ORAL | Status: AC
  Administered 2015-01-31: 50 mg via ORAL
  Filled 2015-01-31: qty 1

## 2015-01-31 MED ORDER — TRAMADOL HCL 50 MG PO TABS: 50.0000 mg | ORAL_TABLET | Freq: Four times a day (QID) | ORAL | Status: DC | PRN

## 2015-01-31 NOTE — ED Provider Notes (Signed)
CSN: [id number]     Arrival date & time 01/30/15  2304 History  This chart was scribed for Bianca Flemings, MD by Rayfield Citizen, ED Scribe. This patient was seen in room B19C/B19C and the patient's care was started at 1:01 AM.    Chief Complaint  Patient presents with  . Leg Pain   Patient is a 69 y.o. female presenting with leg pain. The history is provided by the patient. No language interpreter was used.  Leg Pain    HPI Comments: Bianca [name] is a 69 y.o. female with past medical history of DM (managed with Metformin), HTN who presents to the Emergency Department complaining of right shin pain. Patient explains she was bumped into by a child at a roller rink tonight; "He just skated by me and ran into my leg, now it's really sore." Patient states she was concerned due to her diabetic history; she worried that she might develop a fatal blood clot and wanted to be evaluated.   She reports that she was recently taking a medication for HTN but found during a recent visit to the ED that she had been placed on a "sugar pill," so she stopped taking it. She was previously on a baby aspirin regimen but stopped that as well.   PCP on [address]   Past Medical History  Diagnosis Date  . Diabetes mellitus without complication   . Hypertension    History reviewed. No pertinent past surgical history. No family history on file. History  Substance Use Topics  . Smoking status: Current Every Day Smoker -- 0.50 packs/day    Types: Cigarettes  . Smokeless tobacco: Not on file  . Alcohol Use: No   OB History    No data available     Review of Systems  Musculoskeletal:       Right shin pain  All other systems reviewed and are negative.  Allergies  Review of patient's allergies indicates no known allergies.  Home Medications   Prior to Admission medications   Medication Sig Start Date End Date Taking? Authorizing Provider  clotrimazole (LOTRIMIN) 1 % cream Apply to affected area 2  times daily Patient not taking: Reported on 12/27/2014 01/18/14   Charlann Lange, PA-C  glucose monitoring kit (FREESTYLE) monitoring kit 1 each by Does not apply route 2 (two) times daily. 01/18/14   Charlann Lange, PA-C  HYDROcodone-acetaminophen (NORCO/VICODIN) 5-325 MG per tablet Take 1 tablet by mouth every 4 (four) hours as needed. 12/27/14   Montine Circle, PA-C  metFORMIN (GLUCOPHAGE) 1000 MG tablet Take 1 tablet (1,000 mg total) by mouth 2 (two) times daily. 12/27/14   Montine Circle, PA-C   BP 184/86 mmHg  Pulse 95  Temp(Src) 98.5 F (36.9 C) (Oral)  Resp 14  Ht $R'5\' 6"'ry$  (1.676 m)  Wt 146 lb (66.225 kg)  BMI 23.58 kg/m2  SpO2 97% Physical Exam  Constitutional: She is oriented to person, place, and time. She appears well-developed and well-nourished. No distress.  HENT:  Head: Normocephalic and atraumatic.  Mouth/Throat: Oropharynx is clear and moist. No oropharyngeal exudate.  Moist mucous membranes  Eyes: EOM are normal. Pupils are equal, round, and reactive to light.  Neck: Normal range of motion. Neck supple. No JVD present.  Cardiovascular: Normal rate, regular rhythm and normal heart sounds.  Exam reveals no gallop and no friction rub.   No murmur heard. Pulmonary/Chest: Effort normal and breath sounds normal. No respiratory distress. She has no wheezes. She has no  rales.  Abdominal: Soft. Bowel sounds are normal. She exhibits no mass. There is no tenderness. There is no rebound and no guarding.  Musculoskeletal: Normal range of motion. She exhibits no edema.  Moves all extremities normally.   Contusion to right anterior shin  Lymphadenopathy:    She has no cervical adenopathy.  Neurological: She is alert and oriented to person, place, and time. She displays normal reflexes.  Skin: Skin is warm and dry. No rash noted.  Psychiatric: She has a normal mood and affect. Her behavior is normal.  Nursing note and vitals reviewed.   ED Course  Procedures   DIAGNOSTIC  STUDIES: Oxygen Saturation is 97% on RA, adequate by my interpretation.    COORDINATION OF CARE: 1:09 AM Discussed treatment plan with pt at bedside and pt agreed to plan.   Labs Review Labs Reviewed - No data to display  Imaging Review No results found.   EKG Interpretation None      MDM   Final diagnoses:  Contusion of right lower leg, initial encounter  Essential hypertension    I personally performed the services described in this documentation, which was scribed in my presence. The recorded information has been reviewed and is accurate.  69 year old female with contusion to right shin.  Patient also noted to have hypertension, has been told that she has this before, was on Amaryl which she thought was a blood pressure medicine.  Patient advised to follow-up with primary care Dr. for further workup of her blood pressure.        Bianca Flemings, MD 01/31/15 [phone]

## 2015-01-31 NOTE — Discharge Instructions (Signed)
Contusion A contusion is a deep bruise. Contusions are the result of an injury that caused bleeding under the skin. The contusion may turn blue, purple, or yellow. Minor injuries will give you a painless contusion, but more severe contusions may stay painful and swollen for a few weeks.  CAUSES  A contusion is usually caused by a blow, trauma, or direct force to an area of the body. SYMPTOMS   Swelling and redness of the injured area.  Bruising of the injured area.  Tenderness and soreness of the injured area.  Pain. DIAGNOSIS  The diagnosis can be made by taking a history and physical exam. An X-ray, CT scan, or MRI may be needed to determine if there were any associated injuries, such as fractures. TREATMENT  Specific treatment will depend on what area of the body was injured. In general, the best treatment for a contusion is resting, icing, elevating, and applying cold compresses to the injured area. Over-the-counter medicines may also be recommended for pain control. Ask your caregiver what the best treatment is for your contusion. HOME CARE INSTRUCTIONS   Put ice on the injured area.  Put ice in a plastic bag.  Place a towel between your skin and the bag.  Leave the ice on for 15-20 minutes, 3-4 times a day, or as directed by your health care provider.  Only take over-the-counter or prescription medicines for pain, discomfort, or fever as directed by your caregiver. Your caregiver may recommend avoiding anti-inflammatory medicines (aspirin, ibuprofen, and naproxen) for 48 hours because these medicines may increase bruising.  Rest the injured area.  If possible, elevate the injured area to reduce swelling. SEEK IMMEDIATE MEDICAL CARE IF:   You have increased bruising or swelling.  You have pain that is getting worse.  Your swelling or pain is not relieved with medicines. MAKE SURE YOU:   Understand these instructions.  Will watch your condition.  Will get help right  away if you are not doing well or get worse. Document Released: 07/07/2005 Document Revised: 10/02/2013 Document Reviewed: 08/02/2011 [facility] Patient Information 2015 [place]. This information is not intended to replace advice given to you by your health care provider. Make sure you discuss any questions you have with your health care provider.  Cryotherapy Cryotherapy means treatment with cold. Ice or gel packs can be used to reduce both pain and swelling. Ice is the most helpful within the first 24 to 48 hours after an injury or flare-up from overusing a muscle or joint. Sprains, strains, spasms, burning pain, shooting pain, and aches can all be eased with ice. Ice can also be used when recovering from surgery. Ice is effective, has very few side effects, and is safe for most people to use. PRECAUTIONS  Ice is not a safe treatment option for people with:  Raynaud phenomenon. This is a condition affecting small blood vessels in the extremities. Exposure to cold may cause your problems to return.  Cold hypersensitivity. There are many forms of cold hypersensitivity, including:  Cold urticaria. Red, itchy hives appear on the skin when the tissues begin to warm after being iced.  Cold erythema. This is a red, itchy rash caused by exposure to cold.  Cold hemoglobinuria. Red blood cells break down when the tissues begin to warm after being iced. The hemoglobin that carry oxygen are passed into the urine because they cannot combine with blood proteins fast enough.  Numbness or altered sensitivity in the area being iced. If you have any  of the following conditions, do not use ice until you have discussed cryotherapy with your caregiver:  Heart conditions, such as arrhythmia, angina, or chronic heart disease.  High blood pressure.  Healing wounds or open skin in the area being iced.  Current infections.  Rheumatoid arthritis.  Poor circulation.  Diabetes. Ice slows the blood flow  in the region it is applied. This is beneficial when trying to stop inflamed tissues from spreading irritating chemicals to surrounding tissues. However, if you expose your skin to cold temperatures for too long or without the proper protection, you can damage your skin or nerves. Watch for signs of skin damage due to cold. HOME CARE INSTRUCTIONS Follow these tips to use ice and cold packs safely.  Place a dry or damp towel between the ice and skin. A damp towel will cool the skin more quickly, so you may need to shorten the time that the ice is used.  For a more rapid response, add gentle compression to the ice.  Ice for no more than 10 to 20 minutes at a time. The bonier the area you are icing, the less time it will take to get the benefits of ice.  Check your skin after 5 minutes to make sure there are no signs of a poor response to cold or skin damage.  Rest 20 minutes or more between uses.  Once your skin is numb, you can end your treatment. You can test numbness by very lightly touching your skin. The touch should be so light that you do not see the skin dimple from the pressure of your fingertip. When using ice, most people will feel these normal sensations in this order: cold, burning, aching, and numbness.  Do not use ice on someone who cannot communicate their responses to pain, such as small children or people with dementia. HOW TO MAKE AN ICE PACK Ice packs are the most common way to use ice therapy. Other methods include ice massage, ice baths, and cryosprays. Muscle creams that cause a cold, tingly feeling do not offer the same benefits that ice offers and should not be used as a substitute unless recommended by your caregiver. To make an ice pack, do one of the following:  Place crushed ice or a bag of frozen vegetables in a sealable plastic bag. Squeeze out the excess air. Place this bag inside another plastic bag. Slide the bag into a pillowcase or place a damp towel between  your skin and the bag.  Mix 3 parts water with 1 part rubbing alcohol. Freeze the mixture in a sealable plastic bag. When you remove the mixture from the freezer, it will be slushy. Squeeze out the excess air. Place this bag inside another plastic bag. Slide the bag into a pillowcase or place a damp towel between your skin and the bag. SEEK MEDICAL CARE IF:  You develop white spots on your skin. This may give the skin a blotchy (mottled) appearance.  Your skin turns blue or pale.  Your skin becomes waxy or hard.  Your swelling gets worse. MAKE SURE YOU:   Understand these instructions.  Will watch your condition.  Will get help right away if you are not doing well or get worse. Document Released: 05/24/2011 Document Revised: 02/11/2014 Document Reviewed: 05/24/2011 [facility] Patient Information 2015 [place], [place]. This information is not intended to replace advice given to you by your health care provider. Make sure you discuss any questions you have with your health care provider.  How to Take Your Blood Pressure HOW DO I GET A BLOOD PRESSURE MACHINE?  You can buy an electronic home blood pressure machine at your local pharmacy. Insurance will sometimes cover the cost if you have a prescription.  Ask your doctor what type of machine is best for you. There are different machines for your arm and your wrist.  If you decide to buy a machine to check your blood pressure on your arm, first check the size of your arm so you can buy the right size cuff. To check the size of your arm:   Use a measuring tape that shows both inches and centimeters.   Wrap the measuring tape around the upper-middle part of your arm. You may need someone to help you measure.   Write down your arm measurement in both inches and centimeters.   To measure your blood pressure correctly, it is important to have the right size cuff.   If your arm is up to 13 inches (up to 34 centimeters), get an adult  cuff size.  If your arm is 13 to 17 inches (35 to 44 centimeters), get a large adult cuff size.    If your arm is 17 to 20 inches (45 to 52 centimeters), get an adult thigh cuff.  WHAT DO THE NUMBERS MEAN?   There are two numbers that make up your blood pressure. For example: 120/80.  The first number (120 in our example) is called the "systolic pressure." It is a measure of the pressure in your blood vessels when your heart is pumping blood.  The second number (80 in our example) is called the "diastolic pressure." It is a measure of the pressure in your blood vessels when your heart is resting between beats.  Your doctor will tell you what your blood pressure should be. WHAT SHOULD I DO BEFORE I CHECK MY BLOOD PRESSURE?   Try to rest or relax for at least 30 minutes before you check your blood pressure.  Do not smoke.  Do not have any drinks with caffeine, such as:  Soda.  Coffee.  Tea.  Check your blood pressure in a quiet room.  Sit down and stretch out your arm on a table. Keep your arm at about the level of your heart. Let your arm relax.  Make sure that your legs are not crossed. HOW DO I CHECK MY BLOOD PRESSURE?  Follow the directions that came with your machine.  Make sure you remove any tight-fighting clothing from your arm or wrist. Wrap the cuff around your upper arm or wrist. You should be able to fit a finger between the cuff and your arm. If you cannot fit a finger between the cuff and your arm, it is too tight and should be removed and rewrapped.  Some units require you to manually pump up the arm cuff.  Automatic units inflate the cuff when you press a button.  Cuff deflation is automatic in both models.  After the cuff is inflated, the unit measures your blood pressure and pulse. The readings are shown on a monitor. Hold still and breathe normally while the cuff is inflated.  Getting a reading takes less than a minute.  Some models store readings  in a memory. Some provide a printout of readings. If your machine does not store your readings, keep a written record.  Take readings with you to your next visit with your doctor. Document Released: 09/09/2008 Document Revised: 02/11/2014 Document Reviewed: 11/22/2013 ExitCare Patient  Information 2015 [place], [place]. This information is not intended to replace advice given to you by your health care provider. Make sure you discuss any questions you have with your health care provider.  Managing Your High Blood Pressure Blood pressure is a measurement of how forceful your blood is pressing against the walls of the arteries. Arteries are muscular tubes within the circulatory system. Blood pressure does not stay the same. Blood pressure rises when you are active, excited, or nervous; and it lowers during sleep and relaxation. If the numbers measuring your blood pressure stay above normal most of the time, you are at risk for health problems. High blood pressure (hypertension) is a long-term (chronic) condition in which blood pressure is elevated. A blood pressure reading is recorded as two numbers, such as 120 over 80 (or 120/80). The first, higher number is called the systolic pressure. It is a measure of the pressure in your arteries as the heart beats. The second, lower number is called the diastolic pressure. It is a measure of the pressure in your arteries as the heart relaxes between beats.  Keeping your blood pressure in a normal range is important to your overall health and prevention of health problems, such as heart disease and stroke. When your blood pressure is uncontrolled, your heart has to work harder than normal. High blood pressure is a very common condition in adults because blood pressure tends to rise with age. Men and women are equally likely to have hypertension but at different times in life. Before age 46, men are more likely to have hypertension. After 70 years of age, women are more  likely to have it. Hypertension is especially common in African Americans. This condition often has no signs or symptoms. The cause of the condition is usually not known. Your caregiver can help you come up with a plan to keep your blood pressure in a normal, healthy range. BLOOD PRESSURE STAGES Blood pressure is classified into four stages: normal, prehypertension, stage 1, and stage 2. Your blood pressure reading will be used to determine what type of treatment, if any, is necessary. Appropriate treatment options are tied to these four stages:  Normal  Systolic pressure (mm Hg): below 120.  Diastolic pressure (mm Hg): below 80. Prehypertension  Systolic pressure (mm Hg): 120 to 139.  Diastolic pressure (mm Hg): 80 to 89. Stage1  Systolic pressure (mm Hg): 140 to 159.  Diastolic pressure (mm Hg): 90 to 99. Stage2  Systolic pressure (mm Hg): 160 or above.  Diastolic pressure (mm Hg): 100 or above. RISKS RELATED TO HIGH BLOOD PRESSURE Managing your blood pressure is an important responsibility. Uncontrolled high blood pressure can lead to:  A heart attack.  A stroke.  A weakened blood vessel (aneurysm).  Heart failure.  Kidney damage.  Eye damage.  Metabolic syndrome.  Memory and concentration problems. HOW TO MANAGE YOUR BLOOD PRESSURE Blood pressure can be managed effectively with lifestyle changes and medicines (if needed). Your caregiver will help you come up with a plan to bring your blood pressure within a normal range. Your plan should include the following: Education  Read all information provided by your caregivers about how to control blood pressure.  Educate yourself on the latest guidelines and treatment recommendations. New research is always being done to further define the risks and treatments for high blood pressure. Lifestylechanges  Control your weight.  Avoid smoking.  Stay physically active.  Reduce the amount of salt in your  diet.  Reduce  stress.  Control any chronic conditions, such as high cholesterol or diabetes.  Reduce your alcohol intake. Medicines  Several medicines (antihypertensive medicines) are available, if needed, to bring blood pressure within a normal range. Communication  Review all the medicines you take with your caregiver because there may be side effects or interactions.  Talk with your caregiver about your diet, exercise habits, and other lifestyle factors that may be contributing to high blood pressure.  See your caregiver regularly. Your caregiver can help you create and adjust your plan for managing high blood pressure. RECOMMENDATIONS FOR TREATMENT AND FOLLOW-UP  The following recommendations are based on current guidelines for managing high blood pressure in nonpregnant adults. Use these recommendations to identify the proper follow-up period or treatment option based on your blood pressure reading. You can discuss these options with your caregiver.  Systolic pressure of 120 to 139 or diastolic pressure of 80 to 89: Follow up with your caregiver as directed.  Systolic pressure of 140 to 160 or diastolic pressure of 90 to 100: Follow up with your caregiver within 2 months.  Systolic pressure above 160 or diastolic pressure above 100: Follow up with your caregiver within 1 month.  Systolic pressure above 180 or diastolic pressure above 110: Consider antihypertensive therapy; follow up with your caregiver within 1 week.  Systolic pressure above 200 or diastolic pressure above 120: Begin antihypertensive therapy; follow up with your caregiver within 1 week. Document Released: 06/21/2012 Document Reviewed: 06/21/2012 [facility] Patient Information 2015 [place], [place]. This information is not intended to replace advice given to you by your health care provider. Make sure you discuss any questions you have with your health care provider.   Emergency Department Resource Guide 1) Find a  Doctor and Pay Out of Pocket Although you won't have to find out who is covered by your insurance plan, it is a good idea to ask around and get recommendations. You will then need to call the office and see if the doctor you have chosen will accept you as a new patient and what types of options they offer for patients who are self-pay. Some doctors offer discounts or will set up payment plans for their patients who do not have insurance, but you will need to ask so you aren't surprised when you get to your appointment.  2) Contact Your Local Health Department Not all health departments have doctors that can see patients for sick visits, but many do, so it is worth a call to see if yours does. If you don't know where your local health department is, you can check in your phone book. The CDC also has a tool to help you locate your state's health department, and many state websites also have listings of all of their local health departments.  3) Find a Walk-in Clinic If your illness is not likely to be very severe or complicated, you may want to try a walk in clinic. These are popping up all over the country in pharmacies, drugstores, and shopping centers. They're usually staffed by nurse practitioners or physician assistants that have been trained to treat common illnesses and complaints. They're usually fairly quick and inexpensive. However, if you have serious medical issues or chronic medical problems, these are probably not your best option.  No Primary Care Doctor: - Call Health Connect at  [phone] - they can help you locate a primary care doctor that  accepts your insurance, provides certain services, etc. - Physician Referral Service- [phone]  Chronic Pain  Problems: Organization         Address  Phone   Notes  [facility]  [phone] Patients need to be referred by their primary care doctor.   Medication Assistance: Organization         Address  Phone    Notes  [facility] Medication [facility] [address] [place] [place], [place] [address] [phone] --Must be a resident of [facility] -- Must have NO insurance coverage whatsoever (no Medicaid/ Medicare, etc.) -- The pt. MUST have a primary care doctor that directs their care regularly and follows them in the community   [facility]  [phone]   [employer]  [phone]    Agencies that provide inexpensive medical care: Organization         Address  Phone   Notes  [facility]  [phone]   [facility]    [phone]   [facility] Rhonda W [address] [place], [place] [address] [phone]   [facility] [address] [place] [phone]   Planned Parenthood    [phone]   [facility]    [phone]   [facility]  [address], [place] Phone:  [phone], Fax:  [phone] Hours of Operation:  9 am - 6 pm, M-F.  Also accepts Medicaid/Medicare and self-pay.  [facility] [place] [facility]  [address], [place] Phone: [phone], Fax: [phone]. Hours of Operation:  8:30 am - 5:30 pm, M-F.  Also accepts Medicaid and self-pay.  [facility] [place] [address] [place] Phone: [phone]   [facility] [address] Natasha Bence [place], [place] [phone], Ext. [phone] Mondays & Thursdays: 7-9 AM.  First 15 patients are seen on a first come, first serve basis.    Medicaid-accepting [facility] Providers:  Organization         Address  Phone   Notes  [facility] [address], [place] [phone] Also accepts self-pay patients.  [facility] [address] Laurell Josephs [place], [place]  [phone]   [facility] [address], [place] [phone]   [facility] [address] [place] [phone]   Renaye Rakers [address], [place]   [phone] Only accepts [place] patients after they have their name applied to their card.   Self-Pay (no insurance) in [facility] [address] [facility]:  Organization         Address  Phone   Notes  Sickle Cell Patients, [facility] Internal Medicine [address] [place], [place] [phone]   [facility] [address] [place], [place] [phone]   [facility]  [address], [place] [phone]   [facility]/Dr. Osei-Bonsu  [address] [place] or [address], [place] [phone] Phone number for both [place] and [place] locations is the same.  [facility] [address], [place] [phone]   [facility] [address] [place] or [address] [phone]   [facility] [address], [place] [phone], phone; [phone], fax Sees patients 1st  and 3rd Saturday of every month.  Must not qualify for public or private insurance (i.e. Medicaid, Medicare, [place] Health Choice, Veterans' Benefits)  Household income should be no more than 200% of the poverty level The clinic cannot treat you if you are pregnant or think you are pregnant  Sexually transmitted diseases are not treated at the clinic.    Dental Care: Organization         Address  Phone  Notes  [facility] Department of [facility] [address] [place], [place] [phone] Accepts children up to age 81 who are enrolled in [place] or [place] Health Choice; pregnant women with a Medicaid card; and children who have applied for Medicaid or [place] Health Choice, but were declined, whose parents can pay a reduced fee at time of service.  [facility] Department of [facility]  [address], [place] [phone] Accepts children up to age 17 who are enrolled in [place] or [place] Health Choice; pregnant women with a Medicaid card; and children who have applied for Medicaid or [place] Health Choice, but were declined, whose parents can pay a reduced fee at time of service.  [facility]  [address] [place], [place] [phone] Patients are seen by appointment only. Walk-ins are not accepted. [facility] will see patients 27 years of age and older. Monday - Tuesday (8am-5pm) Most Wednesdays (8:30-5pm) $30 per visit, cash only  [facility]  [address], [facility] [phone] Patients are seen by appointment only. Walk-ins are not accepted. [facility] will see patients 68 years of age and older. One Wednesday Evening (Monthly: Volunteer Based).  $30 per visit, cash only  [employer]  [phone] for adults; Children under age 27, call Graduate Pediatric Dentistry at [phone]. Children aged 45-14, please call [phone] to request a pediatric application.  Dental services are provided in all areas of dental care including fillings, crowns and bridges, complete and partial dentures, implants, gum treatment, root canals, and extractions. Preventive care is also provided. Treatment is provided to both adults and children. Patients are selected via a lottery and there is often a waiting list.   [facility] [address], [place]  [phone] [email or web address]   [facility] [address] [place], [place] [phone], Ext. [phone] Second and Fourth Thursday of each month, opens at 6:30 AM; Clinic ends at 9 AM.  Patients are seen on a first-come first-served basis, and a limited number are seen during each clinic.   [facility]  [address] Ether Griffins [place], [place] [phone]   Eligibility Requirements You must have lived in  [place], [place], or [place] counties for at least the last three months.   You cannot be eligible for state or federal sponsored [place], including [employer], [place], or [employer].   You generally cannot be eligible for healthcare insurance through your employer.    How to apply: Eligibility screenings are held every Tuesday and Wednesday afternoon from 1:00 pm until 4:00 pm. You do not need an appointment for the interview!  [facility] [address], [place], [place] [phone]   [facility] Health Department  [phone]   [facility] Health Department  [phone]   [facility] Health Department  [phone]    Behavioral Health Resources in the Community: Intensive Outpatient Programs Organization  Address  Phone  Notes  [facility] [address] [place], [place] [phone]   [facility] Outpatient [address] [place], [place] [phone]   [facility] [address], [place], [place]  [phone]   [facility] [address],  [place], [place] [phone] or [phone]   Substance Abuse Resources Organization         Address  Phone  Notes  [facility]  [phone]   [facility]  [phone]   [facility]  [phone]   Floydene Flock  [phone]   Residential & Outpatient Substance Abuse Program  [phone]   Psychological Services Organization         Address  Phone  Notes  [facility]  [phone]   [facility] Services  [phone]   [facility] [address], [place] [phone] or [phone]    Mobile Crisis Teams Organization         Address  Phone  Notes  [facility], Mobile Crisis Care Unit  [phone]   [facility]  [address] [place], [place] [phone]   Doristine Locks [address] [place] [phone]    Self-Help/Support Groups Organization         Address  Phone             Notes  [facility] [place] - variety of support groups  [phone] Call for more information  Narcotics Anonymous (NA), [facility] [address], [employer]  2 meetings at this location   Statistician         Address  Phone  Notes  [facility] [address],    [place]  [phone]   [facility]  [address] [place] [address], [place], [place] [phone]   [facility] [address] [place], [place] [phone] Admissions: 8am-3pm M-F  [facility] [address]    [place], [place] [phone]   [facility] [address] [place], [place], [place] [phone]   [facility] [address],  [place], [place] [phone]   [facility] - Intensive Outpatient [address], [place], [place] [phone]   [facility] ([facility] [address] [place]  [place], [place] [phone] or [phone]   [facility] ([facility] [address], [place], [place] [phone] Accepts Medicaid  [facility] [address],  [place] [phone] Substance Abuse/Addiction Treatment   [facility] Organization         Address  Phone  Notes  [facility]  [phone]   Angie Fava, PhD [address] Knack [place], [place]   [phone] or [phone]   [facility] Behavioral   [address] [place], [place] [phone]   [facility] [address] [place], [place] [phone] Insurance/Medicaid/sponsorship through [facility] [address]                                    [place], [place] [phone] Therapy/tele-psych/case  [facility] [address] [place] [phone]    Dr. Lolly Mustache  [phone]   [facility]  [employer]  [employer] [facility] Dept. 1) [address] [place] 2) [address], [place] 3)  [address], [place] [phone]  [phone]   [facility] Child Abuse Hotline [phone] or [phone] (After Hours)

## 2015-02-04 ENCOUNTER — Emergency Department (HOSPITAL_COMMUNITY): Payer: Medicare Other

## 2015-02-04 ENCOUNTER — Encounter (HOSPITAL_COMMUNITY): Payer: Self-pay | Admitting: *Deleted

## 2015-02-04 ENCOUNTER — Emergency Department (HOSPITAL_COMMUNITY)
Admission: EM | Admit: 2015-02-04 | Discharge: 2015-02-04 | Disposition: A | Discharge status: Home / Self Care | Payer: Medicare Other | Attending: Emergency Medicine | Admitting: Emergency Medicine

## 2015-02-04 DIAGNOSIS — R079 Chest pain, unspecified: Secondary | ICD-10-CM | POA: Insufficient documentation

## 2015-02-04 DIAGNOSIS — Z79899 Other long term (current) drug therapy: Secondary | ICD-10-CM | POA: Diagnosis not present

## 2015-02-04 DIAGNOSIS — I10 Essential (primary) hypertension: Secondary | ICD-10-CM | POA: Diagnosis not present

## 2015-02-04 DIAGNOSIS — E119 Type 2 diabetes mellitus without complications: Secondary | ICD-10-CM | POA: Diagnosis not present

## 2015-02-04 DIAGNOSIS — Z7982 Long term (current) use of aspirin: Secondary | ICD-10-CM | POA: Diagnosis not present

## 2015-02-04 DIAGNOSIS — Z720 Tobacco use: Secondary | ICD-10-CM | POA: Diagnosis not present

## 2015-02-04 DIAGNOSIS — R0789 Other chest pain: Secondary | ICD-10-CM | POA: Diagnosis not present

## 2015-02-04 DIAGNOSIS — R1011 Right upper quadrant pain: Secondary | ICD-10-CM | POA: Diagnosis not present

## 2015-02-04 LAB — CBC WITH DIFFERENTIAL/PLATELET
Basophils Absolute: 0 10*3/uL (ref 0.0–0.1)
Basophils Relative: 0 % (ref 0–1)
EOS PCT: 1 % (ref 0–5)
Eosinophils Absolute: 0 10*3/uL (ref 0.0–0.7)
HEMATOCRIT: 43.8 % (ref 36.0–46.0)
HEMOGLOBIN: 14.1 g/dL (ref 12.0–15.0)
LYMPHS ABS: 2 10*3/uL (ref 0.7–4.0)
Lymphocytes Relative: 31 % (ref 12–46)
MCH: 27.2 pg (ref 26.0–34.0)
MCHC: 32.2 g/dL (ref 30.0–36.0)
MCV: 84.4 fL (ref 78.0–100.0)
MONO ABS: 0.4 10*3/uL (ref 0.1–1.0)
MONOS PCT: 7 % (ref 3–12)
NEUTROS ABS: 3.8 10*3/uL (ref 1.7–7.7)
NEUTROS PCT: 61 % (ref 43–77)
Platelets: 198 10*3/uL (ref 150–400)
RBC: 5.19 MIL/uL — ABNORMAL HIGH (ref 3.87–5.11)
RDW: 14.1 % (ref 11.5–15.5)
WBC: 6.3 10*3/uL (ref 4.0–10.5)

## 2015-02-04 LAB — COMPREHENSIVE METABOLIC PANEL
ALK PHOS: 77 U/L (ref 39–117)
ALT: 11 U/L (ref 0–35)
ANION GAP: 10 (ref 5–15)
AST: 15 U/L (ref 0–37)
Albumin: 3.5 g/dL (ref 3.5–5.2)
BUN: 9 mg/dL (ref 6–23)
CALCIUM: 9.2 mg/dL (ref 8.4–10.5)
CHLORIDE: 104 mmol/L (ref 96–112)
CO2: 25 mmol/L (ref 19–32)
Creatinine, Ser: 0.77 mg/dL (ref 0.50–1.10)
GFR calc Af Amer: >90 mL/min (ref >90)
GFR calc non Af Amer: 84 mL/min — ABNORMAL LOW (ref >90)
Glucose, Bld: 175 mg/dL — ABNORMAL HIGH (ref 70–99)
Potassium: 3.8 mmol/L (ref 3.5–5.1)
Sodium: 139 mmol/L (ref 135–145)
TOTAL PROTEIN: 6.9 g/dL (ref 6.0–8.3)
Total Bilirubin: 0.6 mg/dL (ref 0.3–1.2)

## 2015-02-04 LAB — URINALYSIS, ROUTINE W REFLEX MICROSCOPIC
Bilirubin Urine: NEGATIVE
GLUCOSE, UA: NEGATIVE mg/dL
HGB URINE DIPSTICK: NEGATIVE
KETONES UR: 15 mg/dL — AB
Leukocytes, UA: NEGATIVE
Nitrite: NEGATIVE
PH: 6 (ref 5.0–8.0)
Protein, ur: NEGATIVE mg/dL
Specific Gravity, Urine: 1.018 (ref 1.005–1.030)
UROBILINOGEN UA: 1 mg/dL (ref 0.0–1.0)

## 2015-02-04 LAB — I-STAT TROPONIN, ED: Troponin i, poc: 0 ng/mL (ref 0.00–0.08)

## 2015-02-04 LAB — LIPASE, BLOOD: Lipase: 24 U/L (ref 11–59)

## 2015-02-04 MED ORDER — ONDANSETRON 4 MG PO TBDP: 4.0000 mg | ORAL_TABLET | Freq: Three times a day (TID) | ORAL | Status: DC | PRN

## 2015-02-04 MED ORDER — ONDANSETRON HCL 4 MG/2ML IJ SOLN
4.0000 mg | Freq: Once | INTRAMUSCULAR | Status: AC
  Administered 2015-02-04: 4 mg via INTRAVENOUS
  Filled 2015-02-04: qty 2

## 2015-02-04 MED ORDER — TRAMADOL HCL 50 MG PO TABS: 50.0000 mg | ORAL_TABLET | Freq: Four times a day (QID) | ORAL | Status: DC | PRN

## 2015-02-04 MED ORDER — SODIUM CHLORIDE 0.9 % IV SOLN: INTRAVENOUS | Status: DC

## 2015-02-04 MED ORDER — SODIUM CHLORIDE 0.9 % IV BOLUS (SEPSIS)
250.0000 mL | Freq: Once | INTRAVENOUS | Status: AC
  Administered 2015-02-04: 250 mL via INTRAVENOUS

## 2015-02-04 NOTE — ED Notes (Signed)
Patient aware we need a urinalysis, unable to void at this time.

## 2015-02-04 NOTE — Discharge Instructions (Signed)
Today's workup without any specific findings. Tramadol renewed since she threw the other prescription away. Take it with the antinausea medicine. If symptoms get worse return. If symptoms persist recommend following up with your primary care doctor.

## 2015-02-04 NOTE — ED Provider Notes (Addendum)
CSN: [id number]     Arrival date & time 02/04/15  1024 History   First MD Initiated Contact with Patient 02/04/15 1039     Chief Complaint  Patient presents with  . Abdominal Pain     (Consider location/radiation/quality/duration/timing/severity/associated sxs/prior Treatment) Patient is a 69 y.o. female presenting with abdominal pain. The history is provided by the patient.  Abdominal Pain Associated symptoms: chest pain   Associated symptoms: no diarrhea, no dysuria, no fever, no nausea, no shortness of breath and no vomiting    patient brought herself to the emergency department. Onset of right upper quadrant abdominal pain yesterday at 2 in the afternoon. Pain does radiate into the lower part of the right chest and a little bit to the right substernal area. Not associated with shortness of breath diaphoresis nausea vomiting no radiation to the back. Pain is about a 6 out of 10. Patient never had pain like this before. Not made better or worse by anything.  Past Medical History  Diagnosis Date  . Diabetes mellitus without complication   . Hypertension    History reviewed. No pertinent past surgical history. No family history on file. History  Substance Use Topics  . Smoking status: Current Every Day Smoker -- 0.50 packs/day    Types: Cigarettes  . Smokeless tobacco: Not on file  . Alcohol Use: No   OB History    No data available     Review of Systems  Constitutional: Negative for fever and diaphoresis.  HENT: Negative for congestion.   Eyes: Negative for visual disturbance.  Respiratory: Negative for shortness of breath.   Cardiovascular: Positive for chest pain.  Gastrointestinal: Positive for abdominal pain. Negative for nausea, vomiting and diarrhea.  Genitourinary: Negative for dysuria.  Musculoskeletal: Negative for back pain.  Skin: Negative for rash.  Neurological: Negative for headaches.  Hematological: Does not bruise/bleed easily.  Psychiatric/Behavioral:  Negative for confusion.      Allergies  Tramadol  Home Medications   Prior to Admission medications   Medication Sig Start Date End Date Taking? Authorizing Provider  aspirin 81 MG chewable tablet Chew 1 tablet (81 mg total) by mouth daily. 01/31/15  Yes Linton Flemings, MD  bismuth subsalicylate (PEPTO BISMOL) 262 MG/15ML suspension Take 10-15 mLs by mouth every 6 (six) hours as needed for indigestion or diarrhea or loose stools.   Yes Historical Provider, MD  Blood Pressure Monitoring (ADULT BLOOD PRESSURE CUFF LG) KIT Use to check blood pressure twice a day, keep a running log of your blood pressures 01/31/15  Yes Linton Flemings, MD  glucose monitoring kit (FREESTYLE) monitoring kit 1 each by Does not apply route 2 (two) times daily. 01/18/14  Yes Shari Upstill, PA-C  metFORMIN (GLUCOPHAGE) 1000 MG tablet Take 1 tablet (1,000 mg total) by mouth 2 (two) times daily. 12/27/14  Yes Montine Circle, PA-C  clotrimazole (LOTRIMIN) 1 % cream Apply to affected area 2 times daily Patient not taking: Reported on 12/27/2014 01/18/14   Charlann Lange, PA-C  HYDROcodone-acetaminophen (NORCO/VICODIN) 5-325 MG per tablet Take 1 tablet by mouth every 4 (four) hours as needed. Patient not taking: Reported on 01/31/2015 12/27/14   Montine Circle, PA-C  ondansetron (ZOFRAN ODT) 4 MG disintegrating tablet Take 1 tablet (4 mg total) by mouth every 8 (eight) hours as needed for nausea or vomiting. 02/04/15   Fredia Sorrow, MD  traMADol (ULTRAM) 50 MG tablet Take 1 tablet (50 mg total) by mouth every 6 (six) hours as needed. Patient not taking: Reported  on 02/04/2015 01/31/15   Linton Flemings, MD  traMADol (ULTRAM) 50 MG tablet Take 1 tablet (50 mg total) by mouth every 6 (six) hours as needed. 02/04/15   Fredia Sorrow, MD   BP 175/88 mmHg  Pulse 66  Temp(Src) 98.8 F (37.1 C) (Oral)  Resp 16  Ht _0  (1.676 m)  Wt 143 lb (64.864 kg)  BMI 23.09 kg/m2  SpO2 100% Physical Exam  Constitutional: She is oriented to person,  place, and time. She appears well-developed and well-nourished. No distress.  HENT:  Head: Normocephalic and atraumatic.  Mouth/Throat: Oropharynx is clear and moist.  Eyes: Conjunctivae are normal. Pupils are equal, round, and reactive to light.  Neck: Normal range of motion.  Cardiovascular: Normal rate and regular rhythm.   Pulmonary/Chest: Effort normal and breath sounds normal. No respiratory distress. She has no wheezes. She has no rales.  Abdominal: Soft. Bowel sounds are normal. There is tenderness.  Musculoskeletal: Normal range of motion. She exhibits no edema.  Neurological: She is alert and oriented to person, place, and time. No cranial nerve deficit. She exhibits normal muscle tone. Coordination normal.  Skin: Skin is warm. No erythema.  Nursing note and vitals reviewed.   ED Course  Procedures (including critical care time) Labs Review Labs Reviewed  CBC WITH DIFFERENTIAL/PLATELET - Abnormal; Notable for the following:    RBC 5.19 (*)    All other components within normal limits  COMPREHENSIVE METABOLIC PANEL - Abnormal; Notable for the following:    Glucose, Bld 175 (*)    GFR calc non Af Amer 84 (*)    All other components within normal limits  LIPASE, BLOOD  URINALYSIS, ROUTINE W REFLEX MICROSCOPIC  I-STAT TROPOININ, ED   Results for orders placed or performed during the hospital encounter of 02/04/15  CBC with Differential  Result Value Ref Range   WBC 6.3 4.0 - 10.5 K/uL   RBC 5.19 (H) 3.87 - 5.11 MIL/uL   Hemoglobin 14.1 12.0 - 15.0 g/dL   HCT 43.8 36.0 - 46.0 %   MCV 84.4 78.0 - 100.0 fL   MCH 27.2 26.0 - 34.0 pg   MCHC 32.2 30.0 - 36.0 g/dL   RDW 14.1 11.5 - 15.5 %   Platelets 198 150 - 400 K/uL   Neutrophils Relative % 61 43 - 77 %   Neutro Abs 3.8 1.7 - 7.7 K/uL   Lymphocytes Relative 31 12 - 46 %   Lymphs Abs 2.0 0.7 - 4.0 K/uL   Monocytes Relative 7 3 - 12 %   Monocytes Absolute 0.4 0.1 - 1.0 K/uL   Eosinophils Relative 1 0 - 5 %    Eosinophils Absolute 0.0 0.0 - 0.7 K/uL   Basophils Relative 0 0 - 1 %   Basophils Absolute 0.0 0.0 - 0.1 K/uL  Comprehensive metabolic panel  Result Value Ref Range   Sodium 139 135 - 145 mmol/L   Potassium 3.8 3.5 - 5.1 mmol/L   Chloride 104 96 - 112 mmol/L   CO2 25 19 - 32 mmol/L   Glucose, Bld 175 (H) 70 - 99 mg/dL   BUN 9 6 - 23 mg/dL   Creatinine, Ser 0.77 0.50 - 1.10 mg/dL   Calcium 9.2 8.4 - 10.5 mg/dL   Total Protein 6.9 6.0 - 8.3 g/dL   Albumin 3.5 3.5 - 5.2 g/dL   AST 15 0 - 37 U/L   ALT 11 0 - 35 U/L   Alkaline Phosphatase 77 39 - 117  U/L   Total Bilirubin 0.6 0.3 - 1.2 mg/dL   GFR calc non Af Amer 84 (L) >90 mL/min   GFR calc Af Amer >90 >90 mL/min   Anion gap 10 5 - 15  Lipase, blood  Result Value Ref Range   Lipase 24 11 - 59 U/L  I-stat troponin, ED (only if pt is 70 y.o. or older & pain is above umbilicus) - do not order at [facility]  Result Value Ref Range   Troponin i, poc 0.00 0.00 - 0.08 ng/mL   Comment 3             Imaging Review Dg Chest 2 View  02/04/2015   CLINICAL DATA:  Chest pain under the right breast extending into the back.  EXAM: CHEST - 2 VIEW  COMPARISON:  None.  FINDINGS: The heart size and mediastinal contours are within normal limits. Both lungs are clear. The visualized skeletal structures are unremarkable.  IMPRESSION: Negative two view chest x-ray.   Electronically Signed   By: San Morelle M.D.   On: 02/04/2015 12:46   US Abdomen Complete  02/04/2015   CLINICAL DATA:  Right upper quadrant abdominal pain.  EXAM: ULTRASOUND ABDOMEN COMPLETE  COMPARISON:  Lumbar spine radiographs 10/07/2006  FINDINGS: Gallbladder: No gallstones or wall thickening visualized. No sonographic Murphy sign noted. Maximal wall thickness is 1.6 mm, within normal limits  Common bile duct: Diameter: 5.5 mm, within normal limits  Liver: No focal lesion identified. Within normal limits in parenchymal echogenicity.  IVC: No abnormality visualized.  Pancreas:  Visualized portion unremarkable.  Spleen: The spleen is of normal size and echotexture measuring 4.9 cm.  Right Kidney: Length: 11.4 cm, within normal limits. Echogenicity within normal limits. No mass or hydronephrosis visualized.  Left Kidney: Length: 11.6 cm, within normal limits. Echogenicity within normal limits. No mass or hydronephrosis visualized.  Abdominal aorta: 2.6 cm, within normal limits  Other findings: None.  IMPRESSION: Negative abdominal ultrasound.   Electronically Signed   By: San Morelle M.D.   On: 02/04/2015 13:57     EKG Interpretation   Date/Time:  Tuesday February 04 2015 10:32:18 EDT Ventricular Rate:  91 PR Interval:  164 QRS Duration: 76 QT Interval:  372 QTC Calculation: 457 R Axis:   20 Text Interpretation:  Normal sinus rhythm Right atrial enlargement  Inferior infarct , age undetermined Cannot rule out Anterior infarct , age  undetermined Abnormal ECG Confirmed by Treyden Hakim  MD, Nevia Henkin [phone]) on  02/04/2015 10:40:11 AM      MDM   Final diagnoses:  Chest pain  Abdominal pain, RUQ    Patient with complaint of right upper quadrant abdominal pain since yesterday. Does radiate to the lower part of the right chest. No anterior chest pain. However some radiation over towards the right substernal area. Symptoms started at 2:00 in the afternoon yesterday. No nausea and no shortness of breath no vomiting no diarrhea no back pain.  Patient initial troponin negative so not likely cardiac in nature. EKG without acute changes. Chest x-ray negative for pneumonia pneumothorax or pulmonary edema.  Ultrasound of the abdomen ordered to evaluate possible gallstones. Patient's liver function test without any significant abnormalities no leukocytosis. If ultrasound is negative patient can be discharged home with follow-up with her doctor.  Ultrasound is negative. Will treat symptomatically. Patient threw away her tramadol prescription because it caused her to have  nausea and vomiting. She does worse with the hydrocodone. Have renewed her tramadol provided antinausea medicine  to go along with it. Patient will return for any new or worse symptoms. Patient will follow-up with her regular doctor symptoms continue. Workup here without any clinical evidence suggestive of pulmonary embolus no hypoxia and no tachycardia no evidence of any acute cardiac event. No evidence of pneumonia or pneumothorax. No evidence of any gallbladder problems. No evidence of any liver problems or pancreas problems. Patient's complaint could be related to some rib pain in the area. Follow-up will be important.    Fredia Sorrow, MD 02/04/15 [place], MD 02/04/15 1421

## 2015-02-04 NOTE — ED Notes (Signed)
Pt states that she has had rt upper abdominal pain since yesterday pt states that pain was a burning sharp sensation.

## 2015-02-20 ENCOUNTER — Ambulatory Visit (INDEPENDENT_AMBULATORY_CARE_PROVIDER_SITE_OTHER): Payer: Medicare Other | Admitting: Physician Assistant

## 2015-02-20 ENCOUNTER — Other Ambulatory Visit: Payer: Self-pay | Admitting: Physician Assistant

## 2015-02-20 VITALS — BP 122/70 | HR 89 | Temp 98.5°F | Resp 18 | Ht 65.0 in | Wt 143.6 lb

## 2015-02-20 DIAGNOSIS — M62830 Muscle spasm of back: Secondary | ICD-10-CM | POA: Diagnosis not present

## 2015-02-20 DIAGNOSIS — E119 Type 2 diabetes mellitus without complications: Secondary | ICD-10-CM

## 2015-02-20 DIAGNOSIS — R109 Unspecified abdominal pain: Secondary | ICD-10-CM

## 2015-02-20 LAB — POCT URINALYSIS DIPSTICK
Bilirubin, UA: NEGATIVE
Blood, UA: NEGATIVE
Glucose, UA: NEGATIVE
KETONES UA: NEGATIVE
LEUKOCYTES UA: NEGATIVE
Nitrite, UA: NEGATIVE
PH UA: 5.5
PROTEIN UA: 30
Spec Grav, UA: >=1.03
UROBILINOGEN UA: 0.2

## 2015-02-20 LAB — POCT UA - MICROSCOPIC ONLY
BACTERIA, U MICROSCOPIC: NEGATIVE
Casts, Ur, LPF, POC: NEGATIVE
Crystals, Ur, HPF, POC: NEGATIVE
MUCUS UA: NEGATIVE
RBC, urine, microscopic: NEGATIVE
Yeast, UA: NEGATIVE

## 2015-02-20 LAB — POCT GLYCOSYLATED HEMOGLOBIN (HGB A1C): HEMOGLOBIN A1C: 9.1

## 2015-02-20 LAB — MICROALBUMIN, URINE: MICROALB UR: 5.2 mg/dL — AB (ref <2.0)

## 2015-02-20 MED ORDER — METFORMIN HCL 1000 MG PO TABS: 1000.0000 mg | ORAL_TABLET | Freq: Two times a day (BID) | ORAL | Status: DC

## 2015-02-20 MED ORDER — BACLOFEN 10 MG PO TABS: 10.0000 mg | ORAL_TABLET | Freq: Two times a day (BID) | ORAL | Status: DC

## 2015-02-20 NOTE — Patient Instructions (Signed)
Your urine sample was normal today though it did show that you are very dehydrated. Be sure to drink plenty of water and try to stay hydrated.  You do have a muscle spasm in your right back. Please take the baclofen up to twice per day as needed.  You may want to just take 1/2 tab at a time as this medication can make you drowsy.  Be sure to use caution standing up and moving around while on this medication.  Sitting in a new chair that doesn't lean may help alleviate some of these symptoms.  Please come back to see us if the side does not feel better in one week.  Your blood sugar has been elevated the past 3 months. I've refilled the metformin for 3 months. Please plan to see us for a complete physical in the next 1-2 months so you can establish care with a pcp.

## 2015-02-20 NOTE — Progress Notes (Signed)
Subjective:    Patient ID: Bianca [name], female    DOB: November 01, 1945, 69 y.o.   MRN: [id number]  Chief Complaint  Patient presents with  . Flank Pain    right side, feels like sharp pain spreading to back, x 1 week  . Medication Refill    metformin   Patient Active Problem List   Diagnosis Date Noted  . DM 04/17/2008  . TOBACCO ABUSE 04/17/2008   Prior to Admission medications   Medication Sig Start Date End Date Taking? Authorizing Provider  aspirin 81 MG chewable tablet Chew 1 tablet (81 mg total) by mouth daily. 01/31/15  Yes Marisa Severin, MD  bismuth subsalicylate (PEPTO BISMOL) 262 MG/15ML suspension Take 10-15 mLs by mouth every 6 (six) hours as needed for indigestion or diarrhea or loose stools.   Yes Historical Provider, MD  Blood Pressure Monitoring (ADULT BLOOD PRESSURE CUFF LG) KIT Use to check blood pressure twice a day, keep a running log of your blood pressures 01/31/15  Yes Marisa Severin, MD  glucose monitoring kit (FREESTYLE) monitoring kit 1 each by Does not apply route 2 (two) times daily. 01/18/14  Yes Shari Upstill, PA-C  metFORMIN (GLUCOPHAGE) 1000 MG tablet Take 1 tablet (1,000 mg total) by mouth 2 (two) times daily. 12/27/14  Yes Roxy Horseman, PA-C   Medications, allergies, past medical history, surgical history, family history, social history and problem list reviewed and updated.  HPI  67 yof presents with right flank/back pain and needing metformin refill.   Was in ER 02/04/15 for similar pain --> had normal ua (with 15 ketones), normal cbc, normal cmp (with BG 175), normal lipase, normal cxr, normal ekg, normal RUQ [place]. Pt discharged with ultram and instructions to fu with pcp.   Today states pain has persisted. Started about 01/29/15. Intermittent RUQ pains. Come on 2-3 times day. Lasts mins at a time. No aggravating factors. Not assoc with food, exertion, position. Sometimes radiates through to back. Sometimes radiates up to right shoulder. Pain described as  achy. She does also have sharp pain in right upper back that is intermittent as well over past few wks.   Denies hematuria, dysuria, other abd pain, n/v, fevers, chills, sob, cp. Mild non prod cough past week. She sits in office chair at home every day about 4 hrs day. States the chair leans slightly to the right so she slouches that direction in the chair. Has done this for years. No new activities she is doing.   Metformin refill - has been DMII for several yrs. Has obtained refills from other offices. No current pcp. Checks fasting bg at home every morning. 120s-160s. Takes metformin 1000 bid daily. She would like to establish care with [place].   Review of Systems See HPI.     Objective:   Physical Exam  Constitutional: She is oriented to person, place, and time. She appears well-developed and well-nourished.  Non-toxic appearance. She does not have a sickly appearance. She does not appear ill. No distress.  BP 122/70 mmHg  Pulse 89  Temp(Src) 98.5 F (36.9 C) (Oral)  Resp 18  Ht 5\' 5"  (1.651 m)  Wt 143 lb 9.6 oz (65.137 kg)  BMI 23.90 kg/m2  SpO2 98%   Cardiovascular: Normal rate, regular rhythm and normal heart sounds.   Pulmonary/Chest: Effort normal and breath sounds normal. No tachypnea.  Abdominal: Soft. Normal appearance and bowel sounds are normal. There is tenderness in the right upper quadrant. There is CVA tenderness. There is  no rigidity, no rebound, no guarding, no tenderness at McBurney's point and negative Murphy's sign.    Mild RUQ ttp. No rebound, neg murphys. CVA tenderness on right side.   Musculoskeletal:       Thoracic back: She exhibits tenderness. She exhibits no bony tenderness.       Back:  Small spasm noted along right paraspinal/infrascapular border.   Neurological: She is alert and oriented to person, place, and time.  Psychiatric: She has a normal mood and affect. Her speech is normal and behavior is normal.   Results for orders placed or performed in  visit on 02/20/15  POCT urinalysis dipstick  Result Value Ref Range   Color, UA yellow    Clarity, UA clear    Glucose, UA neg    Bilirubin, UA neg    Ketones, UA neg    Spec Grav, UA >=1.030    Blood, UA neg    pH, UA 5.5    Protein, UA 30    Urobilinogen, UA 0.2    Nitrite, UA neg    Leukocytes, UA Negative   POCT UA - Microscopic Only  Result Value Ref Range   WBC, Ur, HPF, POC 0-2    RBC, urine, microscopic neg    Bacteria, U Microscopic neg    Mucus, UA neg    Epithelial cells, urine per micros 0-2    Crystals, Ur, HPF, POC neg    Casts, Ur, LPF, POC neg    Yeast, UA neg   POCT glycosylated hemoglobin (Hb A1C)  Result Value Ref Range   Hemoglobin A1C 9.1       Assessment & Plan:   52 yof presents with right flank/back pain and needing metformin refill.   Type 2 diabetes mellitus without complication - Plan: POCT glycosylated hemoglobin (Hb A1C), Microalbumin, urine, metFORMIN (GLUCOPHAGE) 1000 MG tablet, Ambulatory referral to Podiatry --A1C 9.1 today, no glucose or ketones in urine --referred to podiatry for nail care as requested by pt --refilled metformin 3 months --will leave at current dose for now, long discussion reviewing importance of finding a pcp to monitor her dm2, she plans to contact office in one month to establish care and for a cpe, will likely address 2nd agent at that time --urine ma today  --continue to check bg at home, bring log to pcp appt  Right flank pain - Plan: POCT urinalysis dipstick, POCT UA - Microscopic Only --mild ruq ttp, neg murphys, no rebound, normal ua other than dehydration --neg ruq [place] 3 wks ago, normal lab workup 3 wks ago, neg cxr 3 wks ago, normal lung sounds today --cva tenderness, though this is near area of noted muscle spasm --suspect secondary to muscle strain from sitting every day at home in slanted chair --new chair --> rtc one week if persists despite this measure could do HIDA in future  Muscle spasm of back -  Plan: baclofen (LIORESAL) 10 MG tablet --as noted above, near area of flank ttp --likely from sitting angled in chair daily --new chair, baclofen with caution given of drowsiness, heat, light massage --rtc one week if persists  Julieta Gutting, PA-C Physician Assistant-Certified [facility]  02/21/2015 8:49 PM

## 2015-02-26 ENCOUNTER — Encounter: Payer: Self-pay | Admitting: Family Medicine

## 2015-03-25 ENCOUNTER — Encounter: Payer: Medicare Other | Admitting: Physician Assistant

## 2015-07-22 DIAGNOSIS — E119 Type 2 diabetes mellitus without complications: Secondary | ICD-10-CM | POA: Diagnosis not present

## 2015-11-19 ENCOUNTER — Ambulatory Visit (INDEPENDENT_AMBULATORY_CARE_PROVIDER_SITE_OTHER): Payer: Medicare Other | Admitting: Urgent Care

## 2015-11-19 ENCOUNTER — Telehealth: Payer: Self-pay | Admitting: *Deleted

## 2015-11-19 ENCOUNTER — Ambulatory Visit: Payer: Medicare Other

## 2015-11-19 VITALS — BP 144/70 | HR 87 | Temp 98.6°F | Resp 16 | Ht 65.0 in | Wt 141.0 lb

## 2015-11-19 DIAGNOSIS — Z9119 Patient's noncompliance with other medical treatment and regimen: Secondary | ICD-10-CM | POA: Diagnosis not present

## 2015-11-19 DIAGNOSIS — F17200 Nicotine dependence, unspecified, uncomplicated: Secondary | ICD-10-CM | POA: Diagnosis not present

## 2015-11-19 DIAGNOSIS — R011 Cardiac murmur, unspecified: Secondary | ICD-10-CM | POA: Diagnosis not present

## 2015-11-19 DIAGNOSIS — Z91199 Patient's noncompliance with other medical treatment and regimen due to unspecified reason: Secondary | ICD-10-CM

## 2015-11-19 DIAGNOSIS — R059 Cough, unspecified: Secondary | ICD-10-CM

## 2015-11-19 DIAGNOSIS — R05 Cough: Secondary | ICD-10-CM

## 2015-11-19 DIAGNOSIS — L853 Xerosis cutis: Secondary | ICD-10-CM

## 2015-11-19 DIAGNOSIS — E118 Type 2 diabetes mellitus with unspecified complications: Secondary | ICD-10-CM

## 2015-11-19 DIAGNOSIS — L299 Pruritus, unspecified: Secondary | ICD-10-CM

## 2015-11-19 DIAGNOSIS — F17210 Nicotine dependence, cigarettes, uncomplicated: Secondary | ICD-10-CM | POA: Diagnosis not present

## 2015-11-19 MED ORDER — BENZONATATE 100 MG PO CAPS: 100.0000 mg | ORAL_CAPSULE | Freq: Three times a day (TID) | ORAL | Status: DC | PRN

## 2015-11-19 MED ORDER — AZITHROMYCIN 250 MG PO TABS: ORAL_TABLET | ORAL | Status: DC

## 2015-11-19 NOTE — Patient Instructions (Addendum)
-   Try to use lotion for your skin. Use benadryl for itching.  - For a respiratory infection, start with 2 pills of azithromycin today, then switch to 1 pill daily until the pills are finished. If your cough, doesn't go away, you need a chest x-ray and blood work. Please return to our clinic if this is the case so that we can do appropriate testing.

## 2015-11-19 NOTE — Telephone Encounter (Signed)
Pt requested meter for diabetes supplies.  She declined any lab work today.  Advised her that she has to come in for refills usually every 3-4 months and she declines to do so.  She got upset and left.  Called pharmacy and advised them to put a note on her bag that she can get supplies over the counter without a prescription.

## 2015-11-19 NOTE — Progress Notes (Signed)
MRN: [id number] DOB: Nov 04, 1945  Subjective:   Bianca [name] is a 70 y.o. female presenting for chief complaint of itching; Medication Refill; and Cough  Diabetes - managed with Metformin. She admits not taking it the way she's supposed to. Does not check her blood sugar. She also admits dietary non-compliance, is not active. Patient does not have a PCP. Does not want any testing done for any reason. States that she believes in her religion very strongly and has a distrust for medical providers, believes that "God has healed her diabetes". Denies polydipsia, polyuria, numbness or tingling. ROS as below. Patient is requesting referral to podiatrist for foot care, denies any foot infections or pain. She does not check her feet daily.  Cough - reports ~1 month history of productive cough. Has not tried any medications. Has tried hydrating well, eating soups. She denies fever, chest pain, shob, wheezing, pnd, orthopnea, sore throat. Smoking 1/2ppd, has ~50 pack year history. She is not interested in quitting. Does not want any work up. She would like any medications that we can give her to help with her cough.  Itching - reports intermittent itching of her back. Would like to be checked for shingles.  [place] has a current medication list which includes the following prescription(s): metformin, aspirin, baclofen, bismuth subsalicylate, adult blood pressure cuff lg, and glucose monitoring kit. Also is allergic to tramadol.  Bianca [name]  has a past medical history of Diabetes mellitus without complication ([place]) and Hypertension. Also  has no past surgical history on file.  Objective:   Vitals: BP 144/70 mmHg  Pulse 87  Temp(Src) 98.6 F (37 C) (Oral)  Resp 16  Ht '5\' 5"'$  (1.651 m)  Wt 141 lb (63.957 kg)  BMI 23.46 kg/m2  SpO2 98%  Physical Exam  Constitutional: She is oriented to person, place, and time. She appears well-developed and well-nourished.  HENT:  Mouth/Throat: Oropharynx is clear  and moist.  Eyes: Right eye exhibits no discharge. Left eye exhibits no discharge. No scleral icterus.  Cardiovascular: Normal rate, regular rhythm and intact distal pulses.  Exam reveals no gallop and no friction rub.   No murmur heard. Pulmonary/Chest: No respiratory distress. She has no wheezes. She has no rales.  Musculoskeletal: She exhibits edema (trace over her right foot).  Neurological: She is alert and oriented to person, place, and time.  Skin: Skin is warm and dry.  Patient has very dry skin over her back.   Assessment and Plan :   1. Medical non-compliance - Patient was very clear but not disrespectful about her distrust of medical providers. Despite my medical advice for labs, chest x-ray, medications, she is not interested in diabetes treatment, quitting smoking or any other work up. I offered to help her in the future if she changes her mind which she respectfully declined.  2. Cough 3. Tobacco use disorder 4. Smoking greater than 40 pack years - Will cover for infectious process with Azithromycin. I discussed possibility of malignancy given her extensive history of smoking but patient does not want to pursue any work up.  5. Type 2 diabetes mellitus with complication, without long-term current use of insulin ([place]) - Patient declined labs, medication refills. I counseled her on risks of uncontrolled diabetes but patient refused to hear any of this as well. She did request a referral to podiatry which is pending.  6. Heart murmur, systolic - Patient declined to discuss differential and referral to cardiologist.  7. Itching 8. Dry skin -  Recommended using lotion, benadryl for itching. She does not have shingles.  Jaynee Eagles, PA-C [facility] [phone] 11/19/2015 12:18 PM

## 2016-03-05 DIAGNOSIS — E119 Type 2 diabetes mellitus without complications: Secondary | ICD-10-CM | POA: Diagnosis not present

## 2016-03-05 DIAGNOSIS — H25813 Combined forms of age-related cataract, bilateral: Secondary | ICD-10-CM | POA: Diagnosis not present

## 2016-03-22 DIAGNOSIS — H2512 Age-related nuclear cataract, left eye: Secondary | ICD-10-CM | POA: Diagnosis not present

## 2016-03-22 DIAGNOSIS — H25812 Combined forms of age-related cataract, left eye: Secondary | ICD-10-CM | POA: Diagnosis not present

## 2016-04-06 DIAGNOSIS — H2511 Age-related nuclear cataract, right eye: Secondary | ICD-10-CM | POA: Diagnosis not present

## 2016-04-12 DIAGNOSIS — H25811 Combined forms of age-related cataract, right eye: Secondary | ICD-10-CM | POA: Diagnosis not present

## 2016-04-12 DIAGNOSIS — H2511 Age-related nuclear cataract, right eye: Secondary | ICD-10-CM | POA: Diagnosis not present

## 2016-04-24 ENCOUNTER — Emergency Department (HOSPITAL_COMMUNITY)
Admission: EM | Admit: 2016-04-24 | Discharge: 2016-04-24 | Disposition: A | Discharge status: Home / Self Care | Payer: Medicare Other | Attending: Emergency Medicine | Admitting: Emergency Medicine

## 2016-04-24 ENCOUNTER — Encounter (HOSPITAL_COMMUNITY): Payer: Self-pay

## 2016-04-24 ENCOUNTER — Emergency Department (HOSPITAL_COMMUNITY): Payer: Medicare Other

## 2016-04-24 DIAGNOSIS — R791 Abnormal coagulation profile: Secondary | ICD-10-CM | POA: Insufficient documentation

## 2016-04-24 DIAGNOSIS — G510 Bell's palsy: Secondary | ICD-10-CM | POA: Diagnosis not present

## 2016-04-24 DIAGNOSIS — I10 Essential (primary) hypertension: Secondary | ICD-10-CM | POA: Diagnosis not present

## 2016-04-24 DIAGNOSIS — F17210 Nicotine dependence, cigarettes, uncomplicated: Secondary | ICD-10-CM | POA: Insufficient documentation

## 2016-04-24 DIAGNOSIS — Z79899 Other long term (current) drug therapy: Secondary | ICD-10-CM | POA: Insufficient documentation

## 2016-04-24 DIAGNOSIS — E119 Type 2 diabetes mellitus without complications: Secondary | ICD-10-CM | POA: Insufficient documentation

## 2016-04-24 DIAGNOSIS — R29810 Facial weakness: Secondary | ICD-10-CM | POA: Diagnosis not present

## 2016-04-24 DIAGNOSIS — Z7984 Long term (current) use of oral hypoglycemic drugs: Secondary | ICD-10-CM | POA: Insufficient documentation

## 2016-04-24 LAB — I-STAT CHEM 8, ED
BUN: 10 mg/dL (ref 6–20)
CALCIUM ION: 1.14 mmol/L (ref 1.12–1.23)
Chloride: 102 mmol/L (ref 101–111)
Creatinine, Ser: 0.9 mg/dL (ref 0.44–1.00)
GLUCOSE: 156 mg/dL — AB (ref 65–99)
HCT: 42 % (ref 36.0–46.0)
HEMOGLOBIN: 14.3 g/dL (ref 12.0–15.0)
Potassium: 4 mmol/L (ref 3.5–5.1)
Sodium: 142 mmol/L (ref 135–145)
TCO2: 27 mmol/L (ref 0–100)

## 2016-04-24 LAB — COMPREHENSIVE METABOLIC PANEL
ALT: 11 U/L — AB (ref 14–54)
AST: 17 U/L (ref 15–41)
Albumin: 3.7 g/dL (ref 3.5–5.0)
Alkaline Phosphatase: 87 U/L (ref 38–126)
Anion gap: 5 (ref 5–15)
BILIRUBIN TOTAL: 0.8 mg/dL (ref 0.3–1.2)
BUN: 9 mg/dL (ref 6–20)
CALCIUM: 9 mg/dL (ref 8.9–10.3)
CHLORIDE: 107 mmol/L (ref 101–111)
CO2: 26 mmol/L (ref 22–32)
CREATININE: 0.79 mg/dL (ref 0.44–1.00)
Glucose, Bld: 157 mg/dL — ABNORMAL HIGH (ref 65–99)
Potassium: 4 mmol/L (ref 3.5–5.1)
Sodium: 138 mmol/L (ref 135–145)
TOTAL PROTEIN: 7.1 g/dL (ref 6.5–8.1)

## 2016-04-24 LAB — APTT: APTT: 29 s (ref 24–37)

## 2016-04-24 LAB — CBC
HEMATOCRIT: 42.2 % (ref 36.0–46.0)
Hemoglobin: 13.1 g/dL (ref 12.0–15.0)
MCH: 26.3 pg (ref 26.0–34.0)
MCHC: 31 g/dL (ref 30.0–36.0)
MCV: 84.7 fL (ref 78.0–100.0)
Platelets: 220 10*3/uL (ref 150–400)
RBC: 4.98 MIL/uL (ref 3.87–5.11)
RDW: 15.6 % — AB (ref 11.5–15.5)
WBC: 5.9 10*3/uL (ref 4.0–10.5)

## 2016-04-24 LAB — DIFFERENTIAL
Basophils Absolute: 0 10*3/uL (ref 0.0–0.1)
Basophils Relative: 0 %
Eosinophils Absolute: 0.1 10*3/uL (ref 0.0–0.7)
Eosinophils Relative: 2 %
Lymphocytes Relative: 27 %
Lymphs Abs: 1.6 10*3/uL (ref 0.7–4.0)
MONO ABS: 0.3 10*3/uL (ref 0.1–1.0)
MONOS PCT: 6 %
NEUTROS ABS: 3.8 10*3/uL (ref 1.7–7.7)
Neutrophils Relative %: 65 %

## 2016-04-24 LAB — I-STAT TROPONIN, ED: TROPONIN I, POC: 0 ng/mL (ref 0.00–0.08)

## 2016-04-24 LAB — PROTIME-INR
INR: 1.03 (ref 0.00–1.49)
PROTHROMBIN TIME: 13.7 s (ref 11.6–15.2)

## 2016-04-24 LAB — CBG MONITORING, ED: Glucose-Capillary: 169 mg/dL — ABNORMAL HIGH (ref 65–99)

## 2016-04-24 MED ORDER — PREDNISONE 20 MG PO TABS
60.0000 mg | ORAL_TABLET | Freq: Once | ORAL | Status: AC
  Administered 2016-04-24: 60 mg via ORAL
  Filled 2016-04-24: qty 3

## 2016-04-24 MED ORDER — PREDNISONE 10 MG PO TABS: 60.0000 mg | ORAL_TABLET | Freq: Every day | ORAL | Status: DC

## 2016-04-24 NOTE — ED Notes (Signed)
1132 Dr. Jacqulyn BathLong cleared for CT, 1135 arrived to CT, Neurologist arrived 1140

## 2016-04-24 NOTE — ED Notes (Signed)
Dr Long at bedside

## 2016-04-24 NOTE — ED Provider Notes (Signed)
Emergency Department Provider Note  Time seen: Approximately 11:31 AM  I have reviewed the triage vital signs and the nursing notes.   HISTORY  Chief Complaint Facial Droop   HPI Bianca [name] is a 70 y.o. female with PMH DM, HTN presents to the emergency department for evaluation of sudden onset right face droop. Patient states she woke up around 8:30 this morning feeling normal. She was eating when she suddenly felt a change in her face and mouth. She did not have difficulty chewing or swallowing but "felt like my face was twisted." She believes that these symptoms began suddenly at approximately 9:30 AM. She denies any additional weakness or numbness in her arms or legs. No known history of stroke.    Past Medical History  Diagnosis Date  . Diabetes mellitus without complication ([place])   . Hypertension     Patient Active Problem List   Diagnosis Date Noted  . Type 2 diabetes mellitus ([place]) 04/17/2008  . TOBACCO ABUSE 04/17/2008    History reviewed. No pertinent past surgical history.  Current Outpatient Rx  Name  Route  Sig  Dispense  Refill  . azithromycin (ZITHROMAX) 250 MG tablet      Start with 2 tablets today, then 1 daily thereafter.   6 tablet   0   . benzonatate (TESSALON) 100 MG capsule   Oral   Take 1-2 capsules (100-200 mg total) by mouth 3 (three) times daily as needed for cough.   40 capsule   0   . Blood Pressure Monitoring (ADULT BLOOD PRESSURE CUFF LG) KIT      Use to check blood pressure twice a day, keep a running log of your blood pressures   1 each   0   . glucose monitoring kit (FREESTYLE) monitoring kit   Does not apply   1 each by Does not apply route 2 (two) times daily. Patient not taking: Reported on 11/19/2015   1 each   0   . metFORMIN (GLUCOPHAGE) 1000 MG tablet   Oral   Take 1 tablet (1,000 mg total) by mouth 2 (two) times daily.   60 tablet   2   . predniSONE (DELTASONE) 10 MG tablet   Oral   Take 6 tablets (60  mg total) by mouth daily.   6 tablet   0     Allergies Tramadol  Family History  Problem Relation Age of Onset  . Diabetes Mother   . Diabetes Sister   . Diabetes Brother     Social History Social History  Substance Use Topics  . Smoking status: Current Every Day Smoker -- 0.50 packs/day    Types: Cigarettes  . Smokeless tobacco: None  . Alcohol Use: No    Review of Systems  Constitutional: No fever/chills Eyes: No visual changes. ENT: No sore throat. Cardiovascular: Denies chest pain. Respiratory: Denies shortness of breath. Gastrointestinal: No abdominal pain.  No nausea, no vomiting.  No diarrhea.  No constipation. Genitourinary: Negative for dysuria. Musculoskeletal: Negative for back pain. Skin: Negative for rash. Neurological: Negative for headaches and numbness. Positive right facial droop.   10-point ROS otherwise negative.  ____________________________________________   PHYSICAL EXAM:  VITAL SIGNS: ED Triage Vitals  Enc Vitals Group     BP 04/24/16 1106 185/83 mmHg     Pulse Rate 04/24/16 1106 87     Resp 04/24/16 1106 18     Temp 04/24/16 1106 98.3 F (36.8 C)     Temp Source  04/24/16 1106 Oral     SpO2 04/24/16 1106 97 %     Height 04/24/16 1106 '5\' 6"'$  (1.676 m)     Pain Score 04/24/16 1105 2   Constitutional: Alert and oriented. Well appearing and in no acute distress. Eyes: Conjunctivae are normal. PERRL. EOMI. Head: Atraumatic. Ears:  Healthy appearing ear canals and TMs bilaterally. No lesions in the auditory canal.  Nose: No congestion/rhinnorhea. Mouth/Throat: Mucous membranes are moist.  Oropharynx non-erythematous. Neck: No stridor.  No meningeal signs.  Cardiovascular: Normal rate, regular rhythm. Good peripheral circulation. Grossly normal heart sounds.   Respiratory: Normal respiratory effort.  No retractions. Lungs CTAB. Gastrointestinal: Soft and nontender. No distention.  Musculoskeletal: No lower extremity tenderness nor  edema. No gross deformities of extremities. Neurologic:  Normal speech and language. Right face droop with forehead sparing. No dysarthria. No pronator drift.  Skin:  Skin is warm, dry and intact. No rash noted. Psychiatric: Mood and affect are normal. Speech and behavior are normal.  ____________________________________________   LABS (all labs ordered are listed, but only abnormal results are displayed)  Labs Reviewed  CBC - Abnormal; Notable for the following:    RDW 15.6 (*)    All other components within normal limits  COMPREHENSIVE METABOLIC PANEL - Abnormal; Notable for the following:    Glucose, Bld 157 (*)    ALT 11 (*)    All other components within normal limits  CBG MONITORING, ED - Abnormal; Notable for the following:    Glucose-Capillary 169 (*)    All other components within normal limits  I-STAT CHEM 8, ED - Abnormal; Notable for the following:    Glucose, Bld 156 (*)    All other components within normal limits  PROTIME-INR  APTT  DIFFERENTIAL  I-STAT TROPOININ, ED  CBG MONITORING, ED   ____________________________________________  EKG  Reviewed.  ____________________________________________  RADIOLOGY  Ct Head Wo Contrast  04/24/2016  CLINICAL DATA:  70 year old female with a history of right facial drooping EXAM: CT HEAD WITHOUT CONTRAST TECHNIQUE: Contiguous axial images were obtained from the base of the skull through the vertex without intravenous contrast. COMPARISON:  None. FINDINGS: Unremarkable appearance of the calvarium without acute fracture or aggressive lesion. Unremarkable appearance of the scalp soft tissues. Unremarkable appearance of the bilateral orbits. Mastoid air cells are clear. No significant paranasal sinus disease No acute intracranial hemorrhage, midline shift, or mass effect. Gray-white differentiation is maintained, without CT evidence of acute ischemia. Unremarkable configuration of the ventricles. Calcifications of the anterior  circulation IMPRESSION: No CT evidence of acute intracranial abnormality. ASPECTS score = 10 [place] Stroke Program Early CT Score Normal score = 10 These results were called by telephone at the time of interpretation on 04/24/2016 at 11:54 am to Dr. Nanda Quinton , who verbally acknowledged these results. Signed, Dulcy Fanny. Earleen Newport, DO [facility] Radiology Electronically Signed   By: Corrie Mckusick D.O.   On: 04/24/2016 11:56    ____________________________________________   PROCEDURES  Procedure(s) performed:   Procedures  None ____________________________________________   INITIAL IMPRESSION / ASSESSMENT AND PLAN / ED COURSE  Pertinent labs & imaging results that were available during my care of the patient were reviewed by me and considered in my medical decision making (see chart for details).  Patient presents to the emergency department for evaluation of sudden onset right facial droop. Symptoms began approximately 9:30 AM, 2 hours PTA. Symptoms began suddenly. No additional neurological symptoms. Clinically my suspicion for a Bell's  palsy is relatively high however given the patient's age, risk factors, sudden onset, and some forehead muscle sparring but concern for possible CVA is somewhat elevated. I have chosen to activate a code stroke. Discussed this with patient and nursing staff who will activate the alert.   12:00 PM Code Stroke cancelled. CT negative. Neurology in agreement that symptoms represent bell's palsy. Plan to discharge home with steroids, lubricating eye drops, instructions to tape eye closed at night, and frequent blood sugar checks at home with steroid use. Patient will follow up with her PCP. Discussed these precautions and treatment plan at length with the patient and family at bedside who are in agreement. Specifically discussed not using lubrication drops immediately after prescription eye drops and gently taping the  eye lid closed at night.   ____________________________________________  FINAL CLINICAL IMPRESSION(S) / ED DIAGNOSES  Final diagnoses:  Facial paralysis/Bells palsy     MEDICATIONS GIVEN DURING THIS VISIT:  Medications  predniSONE (DELTASONE) tablet 60 mg (60 mg Oral Given 04/24/16 1239)     NEW OUTPATIENT MEDICATIONS STARTED DURING THIS VISIT:  Discharge Medication List as of 04/24/2016 12:28 PM    START taking these medications   Details  predniSONE (DELTASONE) 10 MG tablet Take 6 tablets (60 mg total) by mouth daily., Starting 04/25/2016, Until Discontinued, Print          Note:  This document was prepared using Dragon voice recognition software and may include unintentional dictation errors.  Nanda Quinton, MD Emergency Medicine  Margette Fast, MD 04/24/16 1933

## 2016-04-24 NOTE — ED Notes (Signed)
Spoke with Dr. Jacqulyn BathLong, will call code stroke. LSN. [id number]

## 2016-04-24 NOTE — Discharge Instructions (Signed)
You have been seen in the ED today with facial droop. This condition should resolve on its own. We are prescribing steroids to assist with symptoms but this may make your blood sugars increase at home. Check your blood sugars frequently. Use over the counter lubricating eye drops throughout the day until symptoms resolve. The pharmacist can assist in selecting these. Gently tape your eye shut at night to prevent drying out as it is difficult to fully close your eye because of weakness. Follow up with your PCP in the coming week. Return to the ED with any new or worsening weakness, numbness, voice changes or difficulty swallowing.  Bell Palsy Bell palsy is a condition in which the muscles on one side of the face become paralyzed. This often causes one side of the face to droop. It is a common condition and most people recover completely. RISK FACTORS Risk factors for Bell palsy include:  Pregnancy.  Diabetes.  An infection by a virus, such as infections that cause cold sores. CAUSES  Bell palsy is caused by damage to or inflammation of a nerve in your face. It is unclear why this happens, but an infection by a virus may lead to it. Most of the time the reason it happens is unknown. SIGNS AND SYMPTOMS  Symptoms can range from mild to severe and can take place over a number of hours. Symptoms may include:  Being unable to:  Raise one or both eyebrows.  Close one or both eyes.  Feel parts of your face (facial numbness).  Drooping of the eyelid and corner of the mouth.  Weakness in the face.  Paralysis of half your face.  Loss of taste.  Sensitivity to loud noises.  Difficulty chewing.  Tearing up of the affected eye.  Dryness in the affected eye.  Drooling.  Pain behind one ear. DIAGNOSIS  Diagnosis of Bell palsy may include:  A medical history and physical exam.  An MRI.  A CT scan.  Electromyography (EMG). This is a test that checks how your nerves are  working. TREATMENT  Treatment may include antiviral medicine to help shorten the length of the condition. Sometimes treatment is not needed and the symptoms go away on their own. HOME CARE INSTRUCTIONS   Take medicines only as directed by your health care provider.  Do facial massages and exercises as directed by your health care provider.  If your eye is affected:  Use moisturizing eye drops to prevent drying of your eye as directed by your health care provider.  Protect your eye as directed by your health care provider. SEEK MEDICAL CARE IF:  Your symptoms do not get better or get worse.  You are drooling.  Your eye is red, irritated, or hurts. SEEK IMMEDIATE MEDICAL CARE IF:   Another part of your body feels weak or numb.  You have difficulty swallowing.  You have a fever along with symptoms of Bell palsy.  You develop neck pain. MAKE SURE YOU:   Understand these instructions.  Will watch your condition.  Will get help right away if you are not doing well or get worse.   This information is not intended to replace advice given to you by your health care provider. Make sure you discuss any questions you have with your health care provider.   Document Released: 09/27/2005 Document Revised: 06/18/2015 Document Reviewed: 01/04/2014 Elsevier Interactive Patient Education [employer]2016 [employer]

## 2016-04-24 NOTE — ED Notes (Addendum)
Patient got up and showered this am and then she ate chicken and noticed while eating felt a funny feeling while chewing, states she feels as if side of right face swollen. Alert and oriented, equal droops but definite right sided facial droop with headache.  She never looked in mirror this am so unable to determine time of onset. Also unsteady gait noted on arrival to triage. Patient thinks maybe started at 0930 but no one around to witness

## 2016-04-24 NOTE — Consult Note (Signed)
NEURO HOSPITALIST CONSULT NOTE   Requestig physician: Dr. Jacqulyn Bath   Reason for Consult:  Right facial weakness.   History obtained from:  Patient and Chart     Bianca [name] is a lovely 70 year-old patient who presents with the gradual onset of right facial weakness over the course of the past few hours.  It seemed to progress slowly.  She noted no weakness in the extremities or change in her vision.  She reports some recent mild viral type symptoms.  She has a known history of diabetes.  There is some mild slurring of speech.  She indicates a slight change in taste sensation on the right side of the tongue.   HPI:                                                                                                                                          Bianca [name] is an 70 y.o. female   Past Medical History  Diagnosis Date  . Diabetes mellitus without complication (HCC)   . Hypertension     History reviewed. No pertinent past surgical history.  Family History  Problem Relation Age of Onset  . Diabetes Mother   . Diabetes Sister   . Diabetes Brother     Family History: reviewed  Social History:  reports that she has been smoking Cigarettes.  She has been smoking about 0.50 packs per day. She does not have any smokeless tobacco history on file. She reports that she does not drink alcohol or use illicit drugs.  Allergies  Allergen Reactions  . Tramadol Nausea And Vomiting    MEDICATIONS:                                                                                                                     I have reviewed the patient's current medications.   ROS:  History obtained from chart review and the patient  General ROS: negative for - chills, fatigue, fever, night sweats, weight gain or weight loss Psychological ROS:  negative for - behavioral disorder, hallucinations, memory difficulties, mood swings or suicidal ideation Ophthalmic ROS: negative for - blurry vision, double vision, eye pain or loss of vision ENT ROS: negative for - epistaxis, nasal discharge, oral lesions, sore throat, tinnitus or vertigo Allergy and Immunology ROS: negative for - hives or itchy/watery eyes Hematological and Lymphatic ROS: negative for - bleeding problems, bruising or swollen lymph nodes Endocrine ROS: negative for - galactorrhea, hair pattern changes, polydipsia/polyuria or temperature intolerance Respiratory ROS: negative for - cough, hemoptysis, shortness of breath or wheezing Cardiovascular ROS: negative for - chest pain, dyspnea on exertion, edema or irregular heartbeat Gastrointestinal ROS: negative for - abdominal pain, diarrhea, hematemesis, nausea/vomiting or stool incontinence Genito-Urinary ROS: negative for - dysuria, hematuria, incontinence or urinary frequency/urgency Musculoskeletal ROS: negative for - joint swelling or muscular weakness Neurological ROS: as noted in HPI Dermatological ROS: negative for rash and skin lesion changes   Blood pressure 172/83, pulse 89, temperature 98.3 F (36.8 C), temperature source Oral, resp. rate 13, height 5\' 6"  (1.676 m), weight 71.215 kg (157 lb), SpO2 100 %.   Neurologic Examination:                                                                                                      HEENT-  Normocephalic, no lesions, without obvious abnormality.  Normal external eye and conjunctiva.  Normal TM's bilaterally.  Normal auditory canals and external ears. Normal external nose, mucus membranes and septum.  Normal pharynx. Cardiovascular- mild systolic ejection murmur Lungs- chest clear, no wheezing, rales, normal symmetric air entry, Heart exam - S1, S2 normal, no murmur, no gallop, rate regular Abdomen- soft, non-tender; bowel sounds normal; no masses,  no  organomegaly Extremities- less then 2 second capillary refill Lymph-no adenopathy palpable Musculoskeletal-no joint tenderness, deformity or swelling Skin-warm and dry, no hyperpigmentation, vitiligo, or suspicious lesions  Neurological Examination Mental Status: Alert, oriented, thought content appropriate.  Speech fluent without evidence of aphasia.  Able to follow 3 step commands without difficulty. Cranial Nerves: II: Discs flat bilaterally; Visual fields grossly normal, pupils equal, round, reactive to light and accommodation III,IV, VI: mild ptosis on the right V,VII: right facial weakness involving forehead, mild drooping of the eyelid, right facial droop VIII: mild hyperacusis on the right IX,X: uvula rises symmetrically XI: bilateral shoulder shrug XII: midline tongue extension Motor: Right : Upper extremity   5/5    Left:     Upper extremity   5/5  Lower extremity   5/5     Lower extremity   5/5 Tone and bulk:normal tone throughout; no atrophy noted Sensory: Pinprick and light touch intact throughout, bilaterally Deep Tendon Reflexes: 2+ and symmetric throughout Plantars: Right: downgoing   Left: downgoing Cerebellar: normal finger-to-nose, normal rapid alternating movements      Lab Results: Basic Metabolic Panel:  Recent Labs Lab 04/24/16 1110 04/24/16 1120  NA 138 142  K 4.0 4.0  CL 107 102  CO2 26  --   GLUCOSE 157* 156*  BUN 9 10  CREATININE 0.79 0.90  CALCIUM 9.0  --     Liver Function Tests:  Recent Labs Lab 04/24/16 1110  AST 17  ALT 11*  ALKPHOS 87  BILITOT 0.8  PROT 7.1  ALBUMIN 3.7   No results for input(s): LIPASE, AMYLASE in the last 168 hours. No results for input(s): AMMONIA in the last 168 hours.  CBC:  Recent Labs Lab 04/24/16 1110 04/24/16 1120  WBC 5.9  --   NEUTROABS 3.8  --   HGB 13.1 14.3  HCT 42.2 42.0  MCV 84.7  --   PLT 220  --     Cardiac Enzymes: No results for input(s): CKTOTAL, CKMB, CKMBINDEX,  TROPONINI in the last 168 hours.  Lipid Panel: No results for input(s): CHOL, TRIG, HDL, CHOLHDL, VLDL, LDLCALC in the last 168 hours.  CBG:  Recent Labs Lab 04/24/16 1102  GLUCAP 169*    Microbiology: No results found for this or any previous visit.  Coagulation Studies:  Recent Labs  04/24/16 1110  LABPROT 13.7  INR 1.03    Imaging: Ct Head Wo Contrast  04/24/2016  CLINICAL DATA:  70 year old female with a history of right facial drooping EXAM: CT HEAD WITHOUT CONTRAST TECHNIQUE: Contiguous axial images were obtained from the base of the skull through the vertex without intravenous contrast. COMPARISON:  None. FINDINGS: Unremarkable appearance of the calvarium without acute fracture or aggressive lesion. Unremarkable appearance of the scalp soft tissues. Unremarkable appearance of the bilateral orbits. Mastoid air cells are clear. No significant paranasal sinus disease No acute intracranial hemorrhage, midline shift, or mass effect. Gray-white differentiation is maintained, without CT evidence of acute ischemia. Unremarkable configuration of the ventricles. Calcifications of the anterior circulation IMPRESSION: No CT evidence of acute intracranial abnormality. ASPECTS score = 10 [place] Stroke Program Early CT Score Normal score = 10 These results were called by telephone at the time of interpretation on 04/24/2016 at 11:54 am to Dr. Alona Bene , who verbally acknowledged these results. Signed, Yvone Neu. Loreta Ave, DO [facility] Radiology Electronically Signed   By: Gilmer Mor D.O.   On: 04/24/2016 11:56    Assessment/Plan:  Bianca [name] presents with a right facial palsy involving all branches of the facial nerve, altered sensation of taste on the right side of the tongue, mild right hyperacusis, with no other focal neurological deficits.  The presentation is most consistent with a Bell's palsy.  Also in the differential diagnosis would be  a diabetic facial palsy.  TPA was not given as the presentation was highly suggestive of a peripheral facial palsy with no reasonable clinical evidence of an ischemic event.  The CT was reviewed and was normal.  The case was discussed with the family and stroke team.  Plan:  1. Steroid protocol for Bell's palsy with careful attention to checking finger stick glucose at home given the known history of DM. 2. Follow up with primary care for cardiac murmur. 3. Smoking cessation is indicated.  Thank you for consulting the neurology service to assist in the care of your patient!   James A. Hilda Blades, M.D. Neurohospitalist Phone: [phone]  04/24/2016, 12:06 PM

## 2017-02-24 ENCOUNTER — Encounter: Payer: Self-pay | Admitting: Physician Assistant

## 2017-02-24 ENCOUNTER — Ambulatory Visit (INDEPENDENT_AMBULATORY_CARE_PROVIDER_SITE_OTHER): Payer: Medicare Other | Admitting: Physician Assistant

## 2017-02-24 ENCOUNTER — Other Ambulatory Visit: Payer: Self-pay | Admitting: Physician Assistant

## 2017-02-24 ENCOUNTER — Ambulatory Visit (INDEPENDENT_AMBULATORY_CARE_PROVIDER_SITE_OTHER): Payer: Medicare Other

## 2017-02-24 VITALS — BP 179/83 | HR 90 | Temp 98.6°F | Resp 16 | Ht 66.0 in | Wt 152.6 lb

## 2017-02-24 DIAGNOSIS — F17210 Nicotine dependence, cigarettes, uncomplicated: Secondary | ICD-10-CM | POA: Diagnosis not present

## 2017-02-24 DIAGNOSIS — Z741 Need for assistance with personal care: Secondary | ICD-10-CM | POA: Diagnosis not present

## 2017-02-24 DIAGNOSIS — J189 Pneumonia, unspecified organism: Secondary | ICD-10-CM

## 2017-02-24 DIAGNOSIS — R05 Cough: Secondary | ICD-10-CM

## 2017-02-24 DIAGNOSIS — R059 Cough, unspecified: Secondary | ICD-10-CM

## 2017-02-24 DIAGNOSIS — E118 Type 2 diabetes mellitus with unspecified complications: Secondary | ICD-10-CM

## 2017-02-24 DIAGNOSIS — R030 Elevated blood-pressure reading, without diagnosis of hypertension: Secondary | ICD-10-CM | POA: Diagnosis not present

## 2017-02-24 DIAGNOSIS — Z79899 Other long term (current) drug therapy: Secondary | ICD-10-CM

## 2017-02-24 DIAGNOSIS — J181 Lobar pneumonia, unspecified organism: Secondary | ICD-10-CM

## 2017-02-24 LAB — CMP14+EGFR
ALT: 7 IU/L (ref 0–32)
AST: 12 IU/L (ref 0–40)
Albumin/Globulin Ratio: 0.9 — ABNORMAL LOW (ref 1.2–2.2)
Albumin: 3.3 g/dL — ABNORMAL LOW (ref 3.5–4.8)
Alkaline Phosphatase: 139 IU/L — ABNORMAL HIGH (ref 39–117)
BUN/Creatinine Ratio: 11 — ABNORMAL LOW (ref 12–28)
BUN: 7 mg/dL — ABNORMAL LOW (ref 8–27)
Bilirubin Total: 0.3 mg/dL (ref 0.0–1.2)
CO2: 25 mmol/L (ref 18–29)
Calcium: 8.7 mg/dL (ref 8.7–10.3)
Chloride: 99 mmol/L (ref 96–106)
Creatinine, Ser: 0.66 mg/dL (ref 0.57–1.00)
GFR calc Af Amer: 102 mL/min/{1.73_m2} (ref >59)
GFR calc non Af Amer: 89 mL/min/{1.73_m2} (ref >59)
Globulin, Total: 3.5 g/dL (ref 1.5–4.5)
Glucose: 224 mg/dL — ABNORMAL HIGH (ref 65–99)
Potassium: 4 mmol/L (ref 3.5–5.2)
Sodium: 141 mmol/L (ref 134–144)
Total Protein: 6.8 g/dL (ref 6.0–8.5)

## 2017-02-24 LAB — HEMOGLOBIN A1C
Est. average glucose Bld gHb Est-mCnc: 367 mg/dL
Hgb A1c MFr Bld: 14.4 % — ABNORMAL HIGH (ref 4.8–5.6)

## 2017-02-24 MED ORDER — AZITHROMYCIN 250 MG PO TABS: ORAL_TABLET | ORAL | 0 refills | Status: DC

## 2017-02-24 MED ORDER — MICROLET LANCETS MISC: 1.0000 | Freq: Every day | 6 refills | Status: DC

## 2017-02-24 MED ORDER — METFORMIN HCL 1000 MG PO TABS: 1000.0000 mg | ORAL_TABLET | Freq: Two times a day (BID) | ORAL | 6 refills | Status: DC

## 2017-02-24 MED ORDER — GLUCOSE BLOOD VI STRP: ORAL_STRIP | 12 refills | Status: DC

## 2017-02-24 NOTE — Progress Notes (Signed)
Bianca [name]  MRN: [id number] DOB: 1946/08/06  PCP: Patient, No Pcp Per  Subjective:  Pt is a 71 year old female PMH DM who presents to clinic for medication management.   H/o DM II - diagnosed in 2008. She is noncompliant. Metformin 1000 bid. Needs refill. Her last dose was last week. She has a difficult time remembering to take this on a daily basis. She takes this irregularly. She was unaware she was supposed to take this twice a day.  Has not checked home blood sugars in >1 year because her meter broke and she did not get a new one. Needs refill of glucose kit.  She is fasting.   C/o cough x 2-3 weeks. Cough is productive. Cough wakes her up during the night. Denies fever, chills, chest pain, wheezing, shob. She has tried Chloreciden - not helping.   H/o Smoking 0.5 ppd x 50+ years.   There are several care gaps for this pt including Colonoscopy, Dexa scan, MM, Tdap, PNA.   Review of Systems  Constitutional: Negative for chills, diaphoresis, fatigue and fever.  HENT: Negative for congestion, postnasal drip, rhinorrhea, sinus pressure, sneezing and sore throat.   Respiratory: Positive for cough. Negative for chest tightness, shortness of breath and wheezing.   Cardiovascular: Negative for chest pain and palpitations.  Gastrointestinal: Negative for abdominal pain, diarrhea, nausea and vomiting.  Neurological: Negative for weakness, light-headedness and headaches.  Psychiatric/Behavioral: Positive for sleep disturbance.    Patient Active Problem List   Diagnosis Date Noted  . Type 2 diabetes mellitus ([place]) 04/17/2008  . TOBACCO ABUSE 04/17/2008    Current Outpatient Prescriptions on File Prior to Visit  Medication Sig Dispense Refill  . Blood Pressure Monitoring (ADULT BLOOD PRESSURE CUFF LG) KIT Use to check blood pressure twice a day, keep a running log of your blood pressures 1 each 0  . glucose monitoring kit (FREESTYLE) monitoring kit 1 each by Does not apply route 2  (two) times daily. 1 each 0  . metFORMIN (GLUCOPHAGE) 1000 MG tablet Take 1 tablet (1,000 mg total) by mouth 2 (two) times daily. 60 tablet 2   No current facility-administered medications on file prior to visit.     Allergies  Allergen Reactions  . Tramadol Nausea And Vomiting     Objective:  BP (!) 179/83 (BP Location: Left Arm, Patient Position: Sitting, Cuff Size: Small)   Pulse 90   Temp 98.6 F (37 C) (Oral)   Resp 16   Ht _0  (1.676 m)   Wt 152 lb 9.6 oz (69.2 kg)   SpO2 100%   BMI 24.63 kg/m   Physical Exam  Constitutional: She is oriented to person, place, and time and well-developed, well-nourished, and in no distress. No distress.  Pulmonary/Chest: Effort normal. She has no decreased breath sounds. She has no wheezes. She has rhonchi in the left middle field and the left lower field.  Neurological: She is alert and oriented to person, place, and time. GCS score is 15.  Skin: Skin is warm and dry.  Psychiatric: Mood, memory, affect and judgment normal.  Vitals reviewed.  Diabetic Foot Exam - Simple   Simple Foot Form Diabetic Foot exam was performed with the following findings:  Yes 02/24/2017  8:54 AM  Visual Inspection No deformities, no ulcerations, no other skin breakdown bilaterally:  Yes Sensation Testing Intact to touch and monofilament testing bilaterally:  Yes Pulse Check Posterior Tibialis and Dorsalis pulse intact bilaterally:  Yes Comments  Lab Results  Component Value Date   HGBA1C 9.1 02/20/2015   Dg Chest 2 View  Result Date: 02/24/2017 CLINICAL DATA:  Cough for 3 weeks. EXAM: CHEST  2 VIEW COMPARISON:  02/04/2015 FINDINGS: Midline trachea. Mild cardiomegaly. Mediastinal contours otherwise within normal limits. No pleural effusion or pneumothorax. Right upper lobe and right middle lobe airspace disease is new. Mild left base scarring or subsegmental atelectasis. IMPRESSION: Right upper and right middle lobe airspace disease, most  consistent with pneumonia. Followup PA and lateral chest X-ray is recommended in 3-4 weeks following trial of antibiotic therapy to ensure resolution and exclude underlying malignancy. Electronically Signed   By: Abigail Miyamoto M.D.   On: 02/24/2017 09:07    Assessment and Plan :  1. Type 2 diabetes mellitus with complication, without long-term current use of insulin ([place]) 2. Encounter for medication management - glucose blood (CONTOUR NEXT TEST) test strip; Use as instructed  Dispense: 100 each; Refill: 12 - MICROLET LANCETS MISC; 1 Device by Does not apply route daily.  Dispense: 100 each; Refill: 6 - Hemoglobin A1c - Microalbumin, urine - CMP14+EGFR - Ambulatory referral to Podiatry - metFORMIN (GLUCOPHAGE) 1000 MG tablet; Take 1 tablet (1,000 mg total) by mouth 2 (two) times daily.  Dispense: 60 tablet; Refill: 6 - DM foot exam done today - wnl. Pt is not compliant with medications. She has a difficult time remembering to take meds daily and was unaware she was to take Metformin bid. Her last dose was last week. Does not check sugars on a regular basis. Discussed putting bid alarm on her phone as a reminder to take medications. She plans to do this. Impressed upon pt need to control sugars. She agrees. Labs are pending. RTC in 4-6 weeks for recheck.    3. Assistance needed for grooming - Ambulatory referral to Podiatry - She has a difficult time trimming her toe nails. Would like referral.   4. Elevated blood pressure reading - Recheck vitals  5. Cough 6. Pneumonia of right upper lobe due to infectious organism ([place]) 7. Smoking greater than 25 pack years - DG Chest 2 View; Future - azithromycin (ZITHROMAX) 250 MG tablet; Take 2 tabs PO x 1 dose, then 1 tab PO QD x 4 days  Dispense: 6 tablet; Refill: 0 - F/u chest X-ray recommended in 3-4 weeks following antibiotic therapy to ensure resolution and exclude underlying malignancy.   Mer[place] Pod, PA-C  [facility] 02/24/2017 8:27 AM

## 2017-02-24 NOTE — Patient Instructions (Addendum)
1) Your chest x-ray is suspect for pneumonia. I am treating you today with antibiotics. Please take the entire course as directed. Finish the course even if you start to feel better.  Please come back in 3-4 weeks for repeat chest x-ray to ensure it has resolved. This is especially important given your longstanding history of smoking.   2) Please set a twice daily alarm on your phone to remind you to take your metformin. You need to take this medication two times a day with food. This medication only works when you take it on a daily basis. Having chronic high blood sugar is very bad for the health of your heart, kidneys, brain, eyes, and other small vessels.   Please see the dosing schedule below to start tapering up on Metformin - this will reduce your diarrhea side effects:  Metformin Dosing (to be taken with food) Week 1: take 1/2 tablet twice a day. Week 2: take 1 tablet in the morning, 1/2 tablet at night. Week 3: take 2 tablets twice a day.  Come back and see me in 4-6 weeks. We will recheck your A1C to make sure your dose of Metformin is working to reduce your blood sugars.   3) Please schedule an eye exam with opthalmology for a routine diabetes eye check.   4) You have several routine screenings and vaccinations that are overdue. Please come back for a routine annual exam. We need to get you a routine mammogram, colonoscopy and dexa scan. You are not up to date on your pneumococcal vaccination  Thank you for coming in today. I hope you feel we met your needs.  Feel free to call [facility] if you have any questions or further requests.  Please consider signing up for MyChart if you do not already have it, as this is a great way to communicate with me.  Best,  Whitney McVey, PA-C  IF you received an x-ray today, you will receive an invoice from [facility]. Please contact [facility] at [phone] with questions or concerns regarding your invoice.   IF you received  labwork today, you will receive an invoice from [facility]. Please contact [facility] at [phone] with questions or concerns regarding your invoice.   Our billing staff will not be able to assist you with questions regarding bills from these companies.  You will be contacted with the lab results as soon as they are available. The fastest way to get your results is to activate your My Chart account. Instructions are located on the last page of this paperwork. If you have not heard from [place] regarding the results in 2 weeks, please contact this office.     We recommend that you schedule a mammogram for breast cancer screening. Typically, you do not need a referral to do this. Please contact a local imaging center to schedule your mammogram.  [facility] - [phone]  *ask for the Radiology Department [facility] - [phone] or [phone]  [facility] - [phone] [place] [phone] [facility] - [phone]  *ask for the [facility] - [phone]  *ask for the Radiology Department [facility] - [phone]  *ask for the [place] - [phone]

## 2017-02-25 LAB — MICROALBUMIN, URINE: Microalbumin, Urine: 167.3 ug/mL

## 2017-03-22 ENCOUNTER — Ambulatory Visit (INDEPENDENT_AMBULATORY_CARE_PROVIDER_SITE_OTHER): Payer: Medicare Other | Admitting: Podiatry

## 2017-03-22 ENCOUNTER — Encounter: Payer: Self-pay | Admitting: Podiatry

## 2017-03-22 VITALS — BP 189/96 | HR 79

## 2017-03-22 DIAGNOSIS — M79676 Pain in unspecified toe(s): Secondary | ICD-10-CM | POA: Diagnosis not present

## 2017-03-22 DIAGNOSIS — E119 Type 2 diabetes mellitus without complications: Secondary | ICD-10-CM

## 2017-03-22 DIAGNOSIS — B351 Tinea unguium: Secondary | ICD-10-CM

## 2017-03-22 NOTE — Progress Notes (Signed)
   Subjective:    Patient ID: Bianca CulverMarlyn S [name], female    DOB: 14-Jun-1946, 71 y.o.   MRN: [id number]  HPI this patient presents to the office with chief complaint of long thick painful nails. Patient states the nails are painful walking and wearing her shoes. This patient is diabetic on metformin. She presents the office today for an evaluation and treatment of her long painful nails    Review of Systems  All other systems reviewed and are negative.      Objective:   Physical Exam GENERAL APPEARANCE: Alert, conversant. Appropriately groomed. No acute distress.  VASCULAR: Pedal pulses are  palpable at  [facility] and [facility] bilateral.  Capillary refill time is immediate to all digits,  Normal temperature gradient.  Digital hair growth is present bilateral  NEUROLOGIC: sensation is normal to 5.07 monofilament at 5/5 sites bilateral.  Light touch is intact bilateral, Muscle strength normal.  MUSCULOSKELETAL: acceptable muscle strength, tone and stability bilateral.  Intrinsic muscluature intact bilateral.  Rectus appearance of foot and digits noted bilateral.  NAILS  thick disfigured discolored nails with subungual debris. No evidence of any bacterial infection or drainage DERMATOLOGIC: skin color, texture, and turgor are within normal limits.  No preulcerative lesions or ulcers  are seen, no interdigital maceration noted.  No open lesions present.   No drainage noted.         Assessment & Plan:  Onychomycosis  B/L  Diabetes with no foot complications.   IE  Debride nails  RTC 3 months.  RTC 3 months.    Helane GuntherGregory Tiney Zipper DPM

## 2017-03-28 ENCOUNTER — Ambulatory Visit: Payer: Medicare Other | Admitting: Physician Assistant

## 2017-04-05 ENCOUNTER — Telehealth: Payer: Self-pay | Admitting: Physician Assistant

## 2017-04-06 NOTE — Telephone Encounter (Signed)
CALLED PT FOR HER TO MAKE AN OV WITH [facility] TO CHECK DM SHE STATES THAT SHE ISN'T COMING BACK INTO OFFICE UNTIL HER LAST BILL IS PAID AND THAT SHE WILL CALL TO MAKE AN APPOINTMENT AT END OF MONTH

## 2017-06-18 ENCOUNTER — Encounter (HOSPITAL_COMMUNITY): Payer: Self-pay

## 2017-06-18 ENCOUNTER — Emergency Department (HOSPITAL_COMMUNITY): Payer: Medicare Other

## 2017-06-18 ENCOUNTER — Emergency Department (HOSPITAL_COMMUNITY)
Admission: EM | Admit: 2017-06-18 | Discharge: 2017-06-18 | Disposition: A | Discharge status: Home / Self Care | Payer: Medicare Other | Attending: Emergency Medicine | Admitting: Emergency Medicine

## 2017-06-18 DIAGNOSIS — W108XXA Fall (on) (from) other stairs and steps, initial encounter: Secondary | ICD-10-CM | POA: Diagnosis not present

## 2017-06-18 DIAGNOSIS — Y999 Unspecified external cause status: Secondary | ICD-10-CM | POA: Diagnosis not present

## 2017-06-18 DIAGNOSIS — Y92008 Other place in unspecified non-institutional (private) residence as the place of occurrence of the external cause: Secondary | ICD-10-CM | POA: Insufficient documentation

## 2017-06-18 DIAGNOSIS — M542 Cervicalgia: Secondary | ICD-10-CM | POA: Insufficient documentation

## 2017-06-18 DIAGNOSIS — S0990XA Unspecified injury of head, initial encounter: Secondary | ICD-10-CM | POA: Diagnosis not present

## 2017-06-18 DIAGNOSIS — J189 Pneumonia, unspecified organism: Secondary | ICD-10-CM | POA: Diagnosis not present

## 2017-06-18 DIAGNOSIS — F17210 Nicotine dependence, cigarettes, uncomplicated: Secondary | ICD-10-CM | POA: Diagnosis not present

## 2017-06-18 DIAGNOSIS — E119 Type 2 diabetes mellitus without complications: Secondary | ICD-10-CM | POA: Insufficient documentation

## 2017-06-18 DIAGNOSIS — S199XXA Unspecified injury of neck, initial encounter: Secondary | ICD-10-CM | POA: Diagnosis not present

## 2017-06-18 DIAGNOSIS — R51 Headache: Secondary | ICD-10-CM | POA: Diagnosis not present

## 2017-06-18 DIAGNOSIS — Y939 Activity, unspecified: Secondary | ICD-10-CM | POA: Insufficient documentation

## 2017-06-18 DIAGNOSIS — S0080XA Unspecified superficial injury of other part of head, initial encounter: Secondary | ICD-10-CM | POA: Diagnosis present

## 2017-06-18 DIAGNOSIS — I10 Essential (primary) hypertension: Secondary | ICD-10-CM | POA: Insufficient documentation

## 2017-06-18 DIAGNOSIS — Z9114 Patient's other noncompliance with medication regimen: Secondary | ICD-10-CM | POA: Diagnosis not present

## 2017-06-18 DIAGNOSIS — S0081XA Abrasion of other part of head, initial encounter: Secondary | ICD-10-CM | POA: Diagnosis not present

## 2017-06-18 DIAGNOSIS — W19XXXA Unspecified fall, initial encounter: Secondary | ICD-10-CM

## 2017-06-18 MED ORDER — ACETAMINOPHEN 500 MG PO TABS: 500.0000 mg | ORAL_TABLET | Freq: Four times a day (QID) | ORAL | 0 refills | Status: DC | PRN

## 2017-06-18 MED ORDER — ACETAMINOPHEN 500 MG PO TABS: 500.0000 mg | ORAL_TABLET | Freq: Once | ORAL | Status: DC

## 2017-06-18 NOTE — ED Triage Notes (Signed)
Pt presents to the ed with complaints of faling today while walking up the steps. The patient landed on her face and has a small abrasion on her left side of her face her right knee and pain in her left toe. Denies LOC or being on blood thinners. Pt is alert and oriented, ambulatory.

## 2017-06-18 NOTE — ED Triage Notes (Signed)
Pt demanded a RX for pain meds. Pt then reported I have been here for 5 hours

## 2017-06-18 NOTE — ED Notes (Signed)
PT walked out and refusedWC

## 2017-06-18 NOTE — ED Triage Notes (Signed)
PT reports tripping up 2 concrete outdoor steps this morning. Pt has an abrasion on Lt side of face ,and abrasion on RT knee . Pt also reports neck pain . Pt in no acute distress and ambulatory to room. Pt also reports Lt great toe pain has increased since fall.

## 2017-06-18 NOTE — ED Triage Notes (Signed)
Pt reports she is ready to go home . Pt reports she does not want to have her blood sugar checked . She id ready.

## 2017-06-18 NOTE — ED Triage Notes (Signed)
Pt refues to wait

## 2017-06-18 NOTE — ED Provider Notes (Signed)
[facility] DEPT Provider Note   CSN: [id number] Arrival date & time: 06/18/17  1123     History   Chief Complaint Chief Complaint  Patient presents with  . Fall    HPI Bianca [name] is a 71 y.o. female with history of diabetes and hypertension who presents today with chief complaint acute onset of mild headache and neck pain status post mechanical fall. She states that earlier today she was walking up 2 steps onto a cement patio when her left foot caught on the step and she fell forward. She states that she slid forward on the cement face down for a short distance. She denies loss of consciousness. She was able to pull herself up to a standing position and has been ambulatory since without difficulty. She endorses mild throbbing headache at the crown as well as mild aching to the right side of her neck without radiation. No lightheadedness or dizziness prior to fall or after. She denies numbness, tingling, weakness, bowel or bladder incontinence, or low back pain. She denies vision changes. She did sustain a small abrasion under her left eye as well as her right knee. She denies pain anywhere else. No SOB, CP, abdominal pain, nausea or vomiting. She states that she goes to see primary care at [facility] when she runs out of her metformin, but  states she is noncompliant with this medication. She also does not take any blood pressure medication although she is consistently hypertensive. She was recently treated for pneumonia in May and there was a recommendation to obtain repeat chest x-ray which she has not done yet.  The history is provided by the patient.    Past Medical History:  Diagnosis Date  . Diabetes mellitus without complication ([place])   . Hypertension     Patient Active Problem List   Diagnosis Date Noted  . Type 2 diabetes mellitus ([place]) 04/17/2008  . TOBACCO ABUSE 04/17/2008    History reviewed. No pertinent surgical history.  OB History    No data available        Home Medications    Prior to Admission medications   Medication Sig Start Date End Date Taking? Authorizing Provider  azithromycin (ZITHROMAX) 250 MG tablet Take 2 tabs PO x 1 dose, then 1 tab PO QD x 4 days Patient not taking: Reported on 03/22/2017 02/24/17   McVey, Gelene Mink, PA-C  Blood Pressure Monitoring (ADULT BLOOD PRESSURE CUFF LG) KIT Use to check blood pressure twice a day, keep a running log of your blood pressures 01/31/15   Linton Flemings, MD  glucose blood (CONTOUR NEXT TEST) test strip Use as instructed 02/24/17   McVey, Gelene Mink, PA-C  glucose monitoring kit (FREESTYLE) monitoring kit 1 each by Does not apply route 2 (two) times daily. 01/18/14   Charlann Lange, PA-C  metFORMIN (GLUCOPHAGE) 1000 MG tablet Take 1 tablet (1,000 mg total) by mouth 2 (two) times daily. 02/24/17   McVey, Gelene Mink, PA-C  MICROLET LANCETS MISC USE TO TEST BLOOD SUGAR ONCE DAILY 02/25/17   McVey, Gelene Mink, PA-C    Family History Family History  Problem Relation Age of Onset  . Diabetes Mother   . Diabetes Sister   . Diabetes Brother     Social History Social History  Substance Use Topics  . Smoking status: Current Every Day Smoker    Packs/day: 0.50    Types: Cigarettes  . Smokeless tobacco: Never Used  . Alcohol use No     Allergies  Tramadol   Review of Systems Review of Systems  Constitutional: Negative for fever.  Eyes: Negative for visual disturbance.  Respiratory: Negative for shortness of breath.   Cardiovascular: Negative for chest pain.  Gastrointestinal: Negative for abdominal pain, nausea and vomiting.  Musculoskeletal: Positive for neck pain. Negative for arthralgias.  Skin: Positive for wound.  Neurological: Positive for headaches. Negative for syncope, weakness, light-headedness and numbness.     Physical Exam Updated Vital Signs BP (!) 179/104   Pulse 100   Temp 98.1 F (36.7 C) (Oral)   Resp 16   Wt 68.9 kg (152 lb)    SpO2 99%   BMI 24.53 kg/m   Physical Exam  Constitutional: She is oriented to person, place, and time. She appears well-developed and well-nourished. No distress.  HENT:  Head: Normocephalic and atraumatic.  No Battle's signs, no raccoon's eyes, no rhinorrhea. No hemotympanum. Mild tenderness to palpation of the crown towards the forehead as well as just under the left lower lid overlying a superficial skin abrasion. No deformity, crepitus, or swelling noted.   Eyes: Pupils are equal, round, and reactive to light. Conjunctivae and EOM are normal. Right eye exhibits no discharge. Left eye exhibits no discharge.  Superficial skin abrasion noted under the left lower lid, mildly tender to palpation with mild swelling. No ecchymosis, no pain with EOMs. No underlying crepitus.  Neck: Normal range of motion. Neck supple. No JVD present. No tracheal deviation present.  No midline spine TTP, no deformity, crepitus, or step-off noted. There is right-sided paraspinal muscle tenderness and trapezius spasm. Mild pain elicited with lateral rotation of the neck  Cardiovascular: Normal rate, regular rhythm and intact distal pulses.  Exam reveals no gallop and no friction rub.   2+ radial and DP/PT pulses bl, negative Homan's bl  Pulmonary/Chest: Effort normal and breath sounds normal. No respiratory distress. She has no wheezes. She has no rales. She exhibits no tenderness.  Equal rise and fall of chest, no increased work of breathing, no paradoxical wall motion no ecchymosis to the chest wall  Abdominal: Soft. Bowel sounds are normal. She exhibits no distension. There is no tenderness.  Musculoskeletal: Normal range of motion. She exhibits no edema or tenderness.  No midline spine TTP, no paraspinal muscle tenderness, no deformity, crepitus, or step-off noted. 5/5 strength of BU ENT BLE major muscle groups. Normal gait, able to heel walk and toe walk without difficulty. Normal range of motion of the  extremities. No swelling, deformity, or crepitus on palpation of the extremities  Neurological: She is alert and oriented to person, place, and time. No cranial nerve deficit or sensory deficit. She exhibits normal muscle tone.  Mental Status:  Alert, thought content appropriate, able to give a coherent history. Speech fluent without evidence of aphasia. Able to follow 2 step commands without difficulty.  Cranial Nerves:  II:  Peripheral visual fields grossly normal, pupils equal, round, reactive to light III,IV, VI: ptosis not present, extra-ocular motions intact bilaterally  V,VII: smile symmetric, facial light touch sensation equal VIII: hearing grossly normal to voice  X: uvula elevates symmetrically  XI: bilateral shoulder shrug symmetric and strong XII: midline tongue extension without fassiculations Motor:  Normal tone. 5/5 strength of BUE and BLE major muscle groups including strong and equal grip strength and dorsiflexion/plantar flexion Sensory: light touch normal in all extremities. Cerebellar: normal finger-to-nose with bilateral upper extremities Gait: normal gait and balance. Able to walk on toes and heels with ease.  CV: 2+ radial  and DP/PT pulses   Skin: Skin is warm and dry. No erythema.  Superficial skin abrasion to the right knee overlying the patella. Bleeding is controlled.  Psychiatric: She has a normal mood and affect. Her behavior is normal.  Nursing note and vitals reviewed.    ED Treatments / Results  Labs (all labs ordered are listed, but only abnormal results are displayed) Labs Reviewed  CBG MONITORING, ED    EKG  EKG Interpretation None      Radiology Dg Chest 2 View  Result Date: 06/18/2017 CLINICAL DATA:  Follow-up pneumonia EXAM: CHEST  2 VIEW COMPARISON:  02/24/2017 FINDINGS: The heart size and mediastinal contours are within normal limits. Both lungs are clear. The visualized skeletal structures are unremarkable. IMPRESSION: No active  cardiopulmonary disease. Electronically Signed   By: Kathreen Devoid   On: 06/18/2017 13:02   Ct Head Wo Contrast  Result Date: 06/18/2017 CLINICAL DATA:  Posttraumatic headache after fall today. No reported loss of consciousness. EXAM: CT HEAD WITHOUT CONTRAST CT CERVICAL SPINE WITHOUT CONTRAST TECHNIQUE: Multidetector CT imaging of the head and cervical spine was performed following the standard protocol without intravenous contrast. Multiplanar CT image reconstructions of the cervical spine were also generated. COMPARISON:  CT scan of April 24, 2016. FINDINGS: CT HEAD FINDINGS Brain: No evidence of acute infarction, hemorrhage, hydrocephalus, extra-axial collection or mass lesion/mass effect. Vascular: No hyperdense vessel or unexpected calcification. Skull: Normal. Negative for fracture or focal lesion. Sinuses/Orbits: No acute finding. Other: None. CT CERVICAL SPINE FINDINGS Alignment: Normal. Skull base and vertebrae: No acute fracture. No primary bone lesion or focal pathologic process. Soft tissues and spinal canal: 1.5 cm calcified right thyroid nodule is noted. Disc levels: Moderate degenerative disc disease is noted at C5-6 with anterior posterior osteophyte formation. Mild degenerative disc disease is noted at C6-7 with anterior and posterior osteophyte formation. Upper chest: Negative. Other: Degenerative changes seen involving the right-sided posterior facet joint of C4-5. IMPRESSION: Normal head CT. Multilevel degenerative disc disease. No acute abnormality seen the cervical spine. 1.5 cm calcified right thyroid nodule is noted. Thyroid ultrasound is recommended for further evaluation. Electronically Signed   By: Marijo Conception, M.D.   On: 06/18/2017 13:32   Ct Cervical Spine Wo Contrast  Result Date: 06/18/2017 CLINICAL DATA:  Posttraumatic headache after fall today. No reported loss of consciousness. EXAM: CT HEAD WITHOUT CONTRAST CT CERVICAL SPINE WITHOUT CONTRAST TECHNIQUE: Multidetector CT  imaging of the head and cervical spine was performed following the standard protocol without intravenous contrast. Multiplanar CT image reconstructions of the cervical spine were also generated. COMPARISON:  CT scan of April 24, 2016. FINDINGS: CT HEAD FINDINGS Brain: No evidence of acute infarction, hemorrhage, hydrocephalus, extra-axial collection or mass lesion/mass effect. Vascular: No hyperdense vessel or unexpected calcification. Skull: Normal. Negative for fracture or focal lesion. Sinuses/Orbits: No acute finding. Other: None. CT CERVICAL SPINE FINDINGS Alignment: Normal. Skull base and vertebrae: No acute fracture. No primary bone lesion or focal pathologic process. Soft tissues and spinal canal: 1.5 cm calcified right thyroid nodule is noted. Disc levels: Moderate degenerative disc disease is noted at C5-6 with anterior posterior osteophyte formation. Mild degenerative disc disease is noted at C6-7 with anterior and posterior osteophyte formation. Upper chest: Negative. Other: Degenerative changes seen involving the right-sided posterior facet joint of C4-5. IMPRESSION: Normal head CT. Multilevel degenerative disc disease. No acute abnormality seen the cervical spine. 1.5 cm calcified right thyroid nodule is noted. Thyroid ultrasound is recommended for further evaluation.  Electronically Signed   By: Marijo Conception, M.D.   On: 06/18/2017 13:32    Procedures Procedures (including critical care time)  Medications Ordered in ED Medications  acetaminophen (TYLENOL) tablet 500 mg (not administered)     Initial Impression / Assessment and Plan / ED Course  I have reviewed the triage vital signs and the nursing notes.  Pertinent labs & imaging results that were available during my care of the patient were reviewed by me and considered in my medical decision making (see chart for details).     Patient presents status post mechanical fall. Afebrile, she is hypertensive but at her baseline and she  is noncompliant with medications. No focal neurologic deficits on examination and she is not on any blood thinners. Ambulatory without difficulty. No concern for nerve entrapment and her abrasion to her left lower lid is superficial. Will obtain head and neck CT to evaluate and rule out skull fracture or ICH. Chest CT shows resolution of her prior pneumonia with no lung nodules concerning for malignancy.  2:12 PM Imaging is reassuring. She does have a thyroid nodule which will require ultrasound but she may follow-up with primary care for this. She is in no apparent distress on reevaluation. Stable for discharge home with ibuprofen and Tylenol. Discussed indications for return to the ED. She'll follow-up with her primary care physician for reevaluation. Pt verbalized understanding of and agreement with plan and is safe for discharge home at this time.   Final Clinical Impressions(s) / ED Diagnoses   Final diagnoses:  Fall, initial encounter    New Prescriptions New Prescriptions   No medications on file     Debroah Baller 06/18/17 1413    Quintella Reichert, MD 06/21/17 1327

## 2017-06-18 NOTE — Discharge Instructions (Signed)
Alternate 600 mg of ibuprofen and [phone] mg of Tylenol every 3 hours as needed for pain. Do not exceed 4000 mg of Tylenol daily. Apply ice or heat to the affected area for come per. Follow-up with your primary care physician for reevaluation of your symptoms and for evaluation of the thyroid nodule that we saw on your CT scan.you will need a thyroid ultrasound to evaluate this. Return to the ED if any concerning signs or symptoms develop.

## 2017-06-21 ENCOUNTER — Ambulatory Visit: Payer: Medicare Other | Admitting: Podiatry

## 2017-06-21 DIAGNOSIS — H04123 Dry eye syndrome of bilateral lacrimal glands: Secondary | ICD-10-CM | POA: Diagnosis not present

## 2017-06-21 DIAGNOSIS — Z961 Presence of intraocular lens: Secondary | ICD-10-CM | POA: Diagnosis not present

## 2017-06-21 DIAGNOSIS — E119 Type 2 diabetes mellitus without complications: Secondary | ICD-10-CM | POA: Diagnosis not present

## 2017-06-22 ENCOUNTER — Ambulatory Visit: Payer: Medicare Other | Admitting: Podiatry

## 2018-08-03 ENCOUNTER — Ambulatory Visit (INDEPENDENT_AMBULATORY_CARE_PROVIDER_SITE_OTHER): Payer: Medicare Other | Admitting: Physician Assistant

## 2018-08-03 ENCOUNTER — Encounter: Payer: Self-pay | Admitting: Physician Assistant

## 2018-08-03 ENCOUNTER — Other Ambulatory Visit: Payer: Self-pay

## 2018-08-03 VITALS — BP 144/86 | HR 95 | Temp 98.3°F | Resp 16 | Ht 65.0 in | Wt 158.0 lb

## 2018-08-03 DIAGNOSIS — E1165 Type 2 diabetes mellitus with hyperglycemia: Secondary | ICD-10-CM

## 2018-08-03 DIAGNOSIS — Z9114 Patient's other noncompliance with medication regimen: Secondary | ICD-10-CM

## 2018-08-03 DIAGNOSIS — Z91199 Patient's noncompliance with other medical treatment and regimen due to unspecified reason: Secondary | ICD-10-CM

## 2018-08-03 DIAGNOSIS — Z9119 Patient's noncompliance with other medical treatment and regimen: Secondary | ICD-10-CM

## 2018-08-03 MED ORDER — METFORMIN HCL 1000 MG PO TABS: 1000.0000 mg | ORAL_TABLET | Freq: Two times a day (BID) | ORAL | 3 refills | Status: DC

## 2018-08-03 NOTE — Patient Instructions (Addendum)
Diabetes.org  Checking your blood sugar is the only way we know if the Metformin is working. Please consider doing this in the future.  Take Metformin 1,000mg  twice daily every single day. Do not skip a day.   If your toes get worse it's most likely because you are not controlling your blood sugar.   Diabetes Mellitus and Nutrition When you have diabetes (diabetes mellitus), it is very important to have healthy eating habits because your blood sugar (glucose) levels are greatly affected by what you eat and drink. Eating healthy foods in the appropriate amounts, at about the same times every day, can help you:  Control your blood glucose.  Lower your risk of heart disease.  Improve your blood pressure.  Reach or maintain a healthy weight.  Every person with diabetes is different, and each person has different needs for a meal plan. Your health care provider may recommend that you work with a diet and nutrition specialist (dietitian) to make a meal plan that is best for you. Your meal plan may vary depending on factors such as:  The calories you need.  The medicines you take.  Your weight.  Your blood glucose, blood pressure, and cholesterol levels.  Your activity level.  Other health conditions you have, such as heart or kidney disease.  How do carbohydrates affect me? Carbohydrates affect your blood glucose level more than any other type of food. Eating carbohydrates naturally increases the amount of glucose in your blood. Carbohydrate counting is a method for keeping track of how many carbohydrates you eat. Counting carbohydrates is important to keep your blood glucose at a healthy level, especially if you use insulin or take certain oral diabetes medicines. It is important to know how many carbohydrates you can safely have in each meal. This is different for every person. Your dietitian can help you calculate how many carbohydrates you should have at each meal and for  snack. Foods that contain carbohydrates include:  Bread, cereal, rice, pasta, and crackers.  Potatoes and corn.  Peas, beans, and lentils.  Milk and yogurt.  Fruit and juice.  Desserts, such as cakes, cookies, ice cream, and candy.  How does alcohol affect me? Alcohol can cause a sudden decrease in blood glucose (hypoglycemia), especially if you use insulin or take certain oral diabetes medicines. Hypoglycemia can be a life-threatening condition. Symptoms of hypoglycemia (sleepiness, dizziness, and confusion) are similar to symptoms of having too much alcohol. If your health care provider says that alcohol is safe for you, follow these guidelines:  Limit alcohol intake to no more than 1 drink per day for nonpregnant women and 2 drinks per day for men. One drink equals 12 oz of beer, 5 oz of wine, or 1 oz of hard liquor.  Do not drink on an empty stomach.  Keep yourself hydrated with water, diet soda, or unsweetened iced tea.  Keep in mind that regular soda, juice, and other mixers may contain a lot of sugar and must be counted as carbohydrates.  What are tips for following this plan? Reading food labels  Start by checking the serving size on the label. The amount of calories, carbohydrates, fats, and other nutrients listed on the label are based on one serving of the food. Many foods contain more than one serving per package.  Check the total grams (g) of carbohydrates in one serving. You can calculate the number of servings of carbohydrates in one serving by dividing the total carbohydrates by 15. For example,  if a food has 30 g of total carbohydrates, it would be equal to 2 servings of carbohydrates.  Check the number of grams (g) of saturated and trans fats in one serving. Choose foods that have low or no amount of these fats.  Check the number of milligrams (mg) of sodium in one serving. Most people should limit total sodium intake to less than 2,300 mg per day.  Always  check the nutrition information of foods labeled as "low-fat" or "nonfat". These foods may be higher in added sugar or refined carbohydrates and should be avoided.  Talk to your dietitian to identify your daily goals for nutrients listed on the label. Shopping  Avoid buying canned, premade, or processed foods. These foods tend to be high in fat, sodium, and added sugar.  Shop around the outside edge of the grocery store. This includes fresh fruits and vegetables, bulk grains, fresh meats, and fresh dairy. Cooking  Use low-heat cooking methods, such as baking, instead of high-heat cooking methods like deep frying.  Cook using healthy oils, such as olive, canola, or sunflower oil.  Avoid cooking with butter, cream, or high-fat meats. Meal planning  Eat meals and snacks regularly, preferably at the same times every day. Avoid going long periods of time without eating.  Eat foods high in fiber, such as fresh fruits, vegetables, beans, and whole grains. Talk to your dietitian about how many servings of carbohydrates you can eat at each meal.  Eat 4-6 ounces of lean protein each day, such as lean meat, chicken, fish, eggs, or tofu. 1 ounce is equal to 1 ounce of meat, chicken, or fish, 1 egg, or 1/4 cup of tofu.  Eat some foods each day that contain healthy fats, such as avocado, nuts, seeds, and fish. Lifestyle   Check your blood glucose regularly.  Exercise at least 30 minutes 5 or more days each week, or as told by your health care provider.  Take medicines as told by your health care provider.  Do not use any products that contain nicotine or tobacco, such as cigarettes and e-cigarettes. If you need help quitting, ask your health care provider.  Work with a Veterinary surgeon or diabetes educator to identify strategies to manage stress and any emotional and social challenges. What are some questions to ask my health care provider?  Do I need to meet with a diabetes educator?  Do I need  to meet with a dietitian?  What number can I call if I have questions?  When are the best times to check my blood glucose? Where to find more information:  American Diabetes Association: diabetes.org/food-and-fitness/food  Academy of Nutrition and Dietetics: https://www.vargas.com/  [employer] of Diabetes and Digestive and Kidney Diseases (NIH): FindJewelers.cz Summary  A healthy meal plan will help you control your blood glucose and maintain a healthy lifestyle.  Working with a diet and nutrition specialist (dietitian) can help you make a meal plan that is best for you.  Keep in mind that carbohydrates and alcohol have immediate effects on your blood glucose levels. It is important to count carbohydrates and to use alcohol carefully. This information is not intended to replace advice given to you by your health care provider. Make sure you discuss any questions you have with your health care provider. Document Released: 06/24/2005 Document Revised: 11/01/2016 Document Reviewed: 11/01/2016 Elsevier Interactive Patient Education  [employer].

## 2018-08-03 NOTE — Progress Notes (Signed)
Bianca [name]  MRN: [id number] DOB: 1946/04/03  PCP: Patient, No Pcp Per  Subjective:  Pt is a 72 year old female who presents to clinic for DM f/u.  Last OV for this problem was 02/24/2017.   DM x 11 years. H/o noncompliance due to "not remembering to take my medication". Metformin BID Refuses DM foot exam today. "they can't tell me anything that I don't already know about my feet." She has been using prayer to control blood sugars.  She does not check home sugars.  Last dose of Metformin was was last week.  She is not eating DM-friendly diet.   Lab Results  Component Value Date   HGBA1C 14.4 (H) 02/24/2017   There are several care gaps for this pt including Colonoscopy, Dexa scan, MM, Tdap, PNA. She refuses all care, "I don't want to know about all that"  Review of Systems  Eyes: Negative for visual disturbance.  Endocrine: Negative for polydipsia, polyphagia and polyuria.  Neurological: Positive for numbness ("stiffness" of toes). Negative for weakness.    Patient Active Problem List   Diagnosis Date Noted  . Type 2 diabetes mellitus ([place]) 04/17/2008  . TOBACCO ABUSE 04/17/2008    Current Outpatient Medications on File Prior to Visit  Medication Sig Dispense Refill  . metFORMIN (GLUCOPHAGE) 1000 MG tablet Take 1 tablet (1,000 mg total) by mouth 2 (two) times daily. 60 tablet 6  . acetaminophen (TYLENOL) 500 MG tablet Take 1 tablet (500 mg total) by mouth every 6 (six) hours as needed. (Patient not taking: Reported on 08/03/2018) 30 tablet 0  . Blood Pressure Monitoring (ADULT BLOOD PRESSURE CUFF LG) KIT Use to check blood pressure twice a day, keep a running log of your blood pressures (Patient not taking: Reported on 08/03/2018) 1 each 0  . glucose blood (CONTOUR NEXT TEST) test strip Use as instructed (Patient not taking: Reported on 08/03/2018) 100 each 12  . glucose monitoring kit (FREESTYLE) monitoring kit 1 each by Does not apply route 2 (two) times daily.  (Patient not taking: Reported on 08/03/2018) 1 each 0  . MICROLET LANCETS MISC USE TO TEST BLOOD SUGAR ONCE DAILY (Patient not taking: Reported on 08/03/2018) 300 each 0   No current facility-administered medications on file prior to visit.     Allergies  Allergen Reactions  . Tramadol Nausea And Vomiting     Objective:  BP (!) 144/86 (BP Location: Left Arm, Patient Position: Sitting, Cuff Size: Normal)   Pulse 95   Temp 98.3 F (36.8 C) (Oral)   Resp 16   Ht '5\' 5"'$  (1.651 m)   Wt 158 lb (71.7 kg)   SpO2 99%   BMI 26.29 kg/m   Physical Exam  Constitutional: She appears well-developed and well-nourished. No distress.  Skin: She is not diaphoretic.  Psychiatric: She has a normal mood and affect. Thought content normal.  Vitals reviewed.   Assessment and Plan :  1. Uncontrolled type 2 diabetes mellitus with hyperglycemia ([place]) 2. Noncompliance with medication regimen 3. Noncompliance with diagnostic testing - Pt is a noncompliant, uncontrolled diabetic x 11 years.  She has been out of metformin x > 1 week, is not taking it on a consistent basis and is not taking it bid. She refused her blood to be drawn and refused a DM foot exam. A1C 02/2017 was 14.4%. When asked if she has interest in caring for her health she said "No, I don't". Pt plans on using the power of prayer to  improve her blood sugar. She mentioned "toes feeling stiff" recently, when I started to open a discussion about physical effects of elevated sugars pt stopped me from talking and said "I don't want to hear none of that negative talk!" She refuses referral to DM nutritionist. Plan to refill Metformin 1,'000mg'$  and hope pt takes bid as directed. Encouraged pt to RTC if feet worsen and for lab work.  - metFORMIN (GLUCOPHAGE) 1000 MG tablet; Take 1 tablet (1,000 mg total) by mouth 2 (two) times daily.  Dispense: 180 tablet; Refill: 3   Whitney Rush Salce, PA-C  [facility] [place] 08/03/2018  5:26 PM  Please note: Portions of this report may have been transcribed using dragon voice recognition software. Every effort was made to ensure accuracy; however, inadvertent computerized transcription errors may be present.

## 2018-08-17 DIAGNOSIS — Z91199 Patient's noncompliance with other medical treatment and regimen due to unspecified reason: Secondary | ICD-10-CM | POA: Insufficient documentation

## 2018-08-17 DIAGNOSIS — Z91148 Patient's other noncompliance with medication regimen for other reason: Secondary | ICD-10-CM | POA: Insufficient documentation

## 2018-08-17 DIAGNOSIS — Z9114 Patient's other noncompliance with medication regimen: Secondary | ICD-10-CM | POA: Insufficient documentation

## 2018-08-17 DIAGNOSIS — Z9119 Patient's noncompliance with other medical treatment and regimen: Secondary | ICD-10-CM | POA: Insufficient documentation

## 2019-04-14 DIAGNOSIS — H5711 Ocular pain, right eye: Secondary | ICD-10-CM | POA: Diagnosis not present

## 2019-04-14 DIAGNOSIS — Z961 Presence of intraocular lens: Secondary | ICD-10-CM | POA: Diagnosis not present

## 2019-04-14 DIAGNOSIS — E119 Type 2 diabetes mellitus without complications: Secondary | ICD-10-CM | POA: Diagnosis not present

## 2019-04-14 DIAGNOSIS — H35033 Hypertensive retinopathy, bilateral: Secondary | ICD-10-CM | POA: Diagnosis not present

## 2019-04-14 DIAGNOSIS — H3561 Retinal hemorrhage, right eye: Secondary | ICD-10-CM | POA: Diagnosis not present

## 2019-04-14 DIAGNOSIS — R030 Elevated blood-pressure reading, without diagnosis of hypertension: Secondary | ICD-10-CM | POA: Diagnosis not present

## 2019-04-14 DIAGNOSIS — H11431 Conjunctival hyperemia, right eye: Secondary | ICD-10-CM | POA: Diagnosis not present

## 2019-04-14 DIAGNOSIS — H3581 Retinal edema: Secondary | ICD-10-CM | POA: Diagnosis not present

## 2019-04-16 DIAGNOSIS — I10 Essential (primary) hypertension: Secondary | ICD-10-CM | POA: Diagnosis not present

## 2019-04-30 DIAGNOSIS — D539 Nutritional anemia, unspecified: Secondary | ICD-10-CM | POA: Diagnosis not present

## 2019-04-30 DIAGNOSIS — E559 Vitamin D deficiency, unspecified: Secondary | ICD-10-CM | POA: Diagnosis not present

## 2019-04-30 DIAGNOSIS — Z1339 Encounter for screening examination for other mental health and behavioral disorders: Secondary | ICD-10-CM | POA: Diagnosis not present

## 2019-04-30 DIAGNOSIS — Z1331 Encounter for screening for depression: Secondary | ICD-10-CM | POA: Diagnosis not present

## 2019-04-30 DIAGNOSIS — Z1159 Encounter for screening for other viral diseases: Secondary | ICD-10-CM | POA: Diagnosis not present

## 2019-04-30 DIAGNOSIS — E1165 Type 2 diabetes mellitus with hyperglycemia: Secondary | ICD-10-CM | POA: Diagnosis not present

## 2019-04-30 DIAGNOSIS — R5383 Other fatigue: Secondary | ICD-10-CM | POA: Diagnosis not present

## 2019-04-30 DIAGNOSIS — I10 Essential (primary) hypertension: Secondary | ICD-10-CM | POA: Diagnosis not present

## 2019-04-30 DIAGNOSIS — Z0000 Encounter for general adult medical examination without abnormal findings: Secondary | ICD-10-CM | POA: Diagnosis not present

## 2019-04-30 DIAGNOSIS — F17210 Nicotine dependence, cigarettes, uncomplicated: Secondary | ICD-10-CM | POA: Diagnosis not present

## 2019-08-27 ENCOUNTER — Other Ambulatory Visit: Payer: Self-pay | Admitting: Physician Assistant

## 2019-08-27 ENCOUNTER — Telehealth: Payer: Self-pay | Admitting: *Deleted

## 2019-08-27 DIAGNOSIS — E1165 Type 2 diabetes mellitus with hyperglycemia: Secondary | ICD-10-CM

## 2019-08-27 NOTE — Telephone Encounter (Signed)
Requested medication (s) are due for refill today: yes  Requested medication (s) are on the active medication list: yes  Last refill: 05/24/2019  Future visit scheduled: no  Notes to clinic: review for refill Overdue for office visit   Requested Prescriptions  Pending Prescriptions Disp Refills   metFORMIN (GLUCOPHAGE) 1000 MG tablet [Pharmacy Med Name: METFORMIN '1000MG'$  TABLETS] 180 tablet 3    Sig: TAKE 1 TABLET(1000 MG) BY MOUTH TWICE DAILY     Endocrinology:  Diabetes - Biguanides Failed - 08/27/2019  3:52 AM      Failed - Cr in normal range and within 360 days    Creatinine, Ser  Date Value Ref Range Status  02/24/2017 0.66 0.57 - 1.00 mg/dL Final         Failed - HBA1C is between 0 and 7.9 and within 180 days    Hgb A1c MFr Bld  Date Value Ref Range Status  02/24/2017 14.4 (H) 4.8 - 5.6 % Final    Comment:             Pre-diabetes: 5.7 - 6.4          Diabetes: >6.4          Glycemic control for adults with diabetes: <7.0          Failed - eGFR in normal range and within 360 days    GFR calc Af Amer  Date Value Ref Range Status  02/24/2017 102 >59 mL/min/1.73 Final   GFR calc non Af Amer  Date Value Ref Range Status  02/24/2017 89 >59 mL/min/1.73 Final         Failed - Valid encounter within last 6 months    Recent Outpatient Visits          1 year ago Uncontrolled type 2 diabetes mellitus with hyperglycemia ([place])   Primary Care at [facility], Gelene Mink, PA-C   2 years ago Type 2 diabetes mellitus with complication, without long-term current use of insulin [facility])   Primary Care at [facility], Gelene Mink, PA-C   3 years ago Medical non-compliance   Primary Care at [facility], [place], PA-C   4 years ago Type 2 diabetes mellitus without complication   Primary Care at [facility], [place], [place]

## 2019-08-27 NOTE — Telephone Encounter (Signed)
Spoke with pt. She currently has two bottles of metformin and is getting refill prescriptions at [facility]. Advised pt she can continue care at pcp and would need a toc. Pt understood and will cb to schedule before next refill

## 2019-08-27 NOTE — Telephone Encounter (Signed)
30 days of medication sent Patient will need TOC for any additional refills

## 2019-11-26 DIAGNOSIS — I10 Essential (primary) hypertension: Secondary | ICD-10-CM | POA: Diagnosis not present

## 2019-11-26 DIAGNOSIS — E119 Type 2 diabetes mellitus without complications: Secondary | ICD-10-CM | POA: Diagnosis not present

## 2019-11-26 DIAGNOSIS — F17210 Nicotine dependence, cigarettes, uncomplicated: Secondary | ICD-10-CM | POA: Diagnosis not present

## 2019-11-26 DIAGNOSIS — M549 Dorsalgia, unspecified: Secondary | ICD-10-CM | POA: Diagnosis not present

## 2019-12-04 DIAGNOSIS — F17210 Nicotine dependence, cigarettes, uncomplicated: Secondary | ICD-10-CM | POA: Diagnosis not present

## 2019-12-04 DIAGNOSIS — I719 Aortic aneurysm of unspecified site, without rupture: Secondary | ICD-10-CM | POA: Diagnosis not present

## 2019-12-04 DIAGNOSIS — M549 Dorsalgia, unspecified: Secondary | ICD-10-CM | POA: Diagnosis not present

## 2019-12-04 DIAGNOSIS — E119 Type 2 diabetes mellitus without complications: Secondary | ICD-10-CM | POA: Diagnosis not present

## 2019-12-04 DIAGNOSIS — I10 Essential (primary) hypertension: Secondary | ICD-10-CM | POA: Diagnosis not present

## 2019-12-19 ENCOUNTER — Ambulatory Visit: Payer: Medicare Other | Admitting: Podiatry

## 2019-12-19 DIAGNOSIS — Z23 Encounter for immunization: Secondary | ICD-10-CM | POA: Diagnosis not present

## 2020-01-06 ENCOUNTER — Inpatient Hospital Stay (HOSPITAL_COMMUNITY): Payer: Medicare Other

## 2020-01-06 ENCOUNTER — Inpatient Hospital Stay (HOSPITAL_COMMUNITY)
Admission: EM | Admit: 2020-01-06 | Discharge: 2020-01-07 | DRG: 304 | Discharge status: Against Medical Advice | Payer: Medicare Other | Attending: Internal Medicine | Admitting: Internal Medicine

## 2020-01-06 ENCOUNTER — Other Ambulatory Visit: Payer: Self-pay

## 2020-01-06 ENCOUNTER — Emergency Department (HOSPITAL_COMMUNITY): Payer: Medicare Other

## 2020-01-06 DIAGNOSIS — R0602 Shortness of breath: Secondary | ICD-10-CM | POA: Diagnosis not present

## 2020-01-06 DIAGNOSIS — D72829 Elevated white blood cell count, unspecified: Secondary | ICD-10-CM | POA: Diagnosis not present

## 2020-01-06 DIAGNOSIS — Z833 Family history of diabetes mellitus: Secondary | ICD-10-CM | POA: Diagnosis not present

## 2020-01-06 DIAGNOSIS — Z9114 Patient's other noncompliance with medication regimen: Secondary | ICD-10-CM

## 2020-01-06 DIAGNOSIS — F17210 Nicotine dependence, cigarettes, uncomplicated: Secondary | ICD-10-CM | POA: Diagnosis not present

## 2020-01-06 DIAGNOSIS — I4510 Unspecified right bundle-branch block: Secondary | ICD-10-CM | POA: Diagnosis present

## 2020-01-06 DIAGNOSIS — Z20822 Contact with and (suspected) exposure to covid-19: Secondary | ICD-10-CM | POA: Diagnosis not present

## 2020-01-06 DIAGNOSIS — R52 Pain, unspecified: Secondary | ICD-10-CM | POA: Diagnosis not present

## 2020-01-06 DIAGNOSIS — J811 Chronic pulmonary edema: Secondary | ICD-10-CM | POA: Diagnosis not present

## 2020-01-06 DIAGNOSIS — I110 Hypertensive heart disease with heart failure: Secondary | ICD-10-CM

## 2020-01-06 DIAGNOSIS — Z7984 Long term (current) use of oral hypoglycemic drugs: Secondary | ICD-10-CM | POA: Diagnosis not present

## 2020-01-06 DIAGNOSIS — I161 Hypertensive emergency: Principal | ICD-10-CM

## 2020-01-06 DIAGNOSIS — R778 Other specified abnormalities of plasma proteins: Secondary | ICD-10-CM

## 2020-01-06 DIAGNOSIS — I509 Heart failure, unspecified: Secondary | ICD-10-CM

## 2020-01-06 DIAGNOSIS — I5031 Acute diastolic (congestive) heart failure: Secondary | ICD-10-CM

## 2020-01-06 DIAGNOSIS — J9601 Acute respiratory failure with hypoxia: Secondary | ICD-10-CM

## 2020-01-06 DIAGNOSIS — E119 Type 2 diabetes mellitus without complications: Secondary | ICD-10-CM

## 2020-01-06 DIAGNOSIS — R0902 Hypoxemia: Secondary | ICD-10-CM | POA: Diagnosis not present

## 2020-01-06 DIAGNOSIS — I160 Hypertensive urgency: Secondary | ICD-10-CM

## 2020-01-06 DIAGNOSIS — R000 Tachycardia, unspecified: Secondary | ICD-10-CM | POA: Diagnosis not present

## 2020-01-06 DIAGNOSIS — Z79899 Other long term (current) drug therapy: Secondary | ICD-10-CM | POA: Diagnosis not present

## 2020-01-06 DIAGNOSIS — I214 Non-ST elevation (NSTEMI) myocardial infarction: Secondary | ICD-10-CM | POA: Diagnosis not present

## 2020-01-06 HISTORY — DX: Non-ST elevation (NSTEMI) myocardial infarction: I21.4

## 2020-01-06 HISTORY — DX: Dyspnea, unspecified: R06.00

## 2020-01-06 HISTORY — DX: Hypertensive urgency: I16.0

## 2020-01-06 LAB — CBC
HCT: 43.9 % (ref 36.0–46.0)
Hemoglobin: 13.2 g/dL (ref 12.0–15.0)
MCH: 26.7 pg (ref 26.0–34.0)
MCHC: 30.1 g/dL (ref 30.0–36.0)
MCV: 88.9 fL (ref 80.0–100.0)
Platelets: 248 10*3/uL (ref 150–400)
RBC: 4.94 MIL/uL (ref 3.87–5.11)
RDW: 15 % (ref 11.5–15.5)
WBC: 12.3 10*3/uL — ABNORMAL HIGH (ref 4.0–10.5)
nRBC: 0 % (ref 0.0–0.2)

## 2020-01-06 LAB — CBC WITH DIFFERENTIAL/PLATELET
Abs Immature Granulocytes: 0.06 10*3/uL (ref 0.00–0.07)
Basophils Absolute: 0 10*3/uL (ref 0.0–0.1)
Basophils Relative: 0 %
Eosinophils Absolute: 0 10*3/uL (ref 0.0–0.5)
Eosinophils Relative: 0 %
HCT: 44.8 % (ref 36.0–46.0)
Hemoglobin: 13.8 g/dL (ref 12.0–15.0)
Immature Granulocytes: 1 %
Lymphocytes Relative: 7 %
Lymphs Abs: 0.7 10*3/uL (ref 0.7–4.0)
MCH: 27.2 pg (ref 26.0–34.0)
MCHC: 30.8 g/dL (ref 30.0–36.0)
MCV: 88.4 fL (ref 80.0–100.0)
Monocytes Absolute: 0.2 10*3/uL (ref 0.1–1.0)
Monocytes Relative: 2 %
Neutro Abs: 8.5 10*3/uL — ABNORMAL HIGH (ref 1.7–7.7)
Neutrophils Relative %: 90 %
Platelets: 238 10*3/uL (ref 150–400)
RBC: 5.07 MIL/uL (ref 3.87–5.11)
RDW: 14.9 % (ref 11.5–15.5)
WBC: 9.5 10*3/uL (ref 4.0–10.5)
nRBC: 0 % (ref 0.0–0.2)

## 2020-01-06 LAB — GLUCOSE, CAPILLARY: Glucose-Capillary: 162 mg/dL — ABNORMAL HIGH (ref 70–99)

## 2020-01-06 LAB — BASIC METABOLIC PANEL
Anion gap: 12 (ref 5–15)
Anion gap: 13 (ref 5–15)
BUN: 13 mg/dL (ref 8–23)
BUN: 13 mg/dL (ref 8–23)
CO2: 23 mmol/L (ref 22–32)
CO2: 26 mmol/L (ref 22–32)
Calcium: 8.8 mg/dL — ABNORMAL LOW (ref 8.9–10.3)
Calcium: 8.8 mg/dL — ABNORMAL LOW (ref 8.9–10.3)
Chloride: 105 mmol/L (ref 98–111)
Chloride: 102 mmol/L (ref 98–111)
Creatinine, Ser: 0.88 mg/dL (ref 0.44–1.00)
Creatinine, Ser: 0.97 mg/dL (ref 0.44–1.00)
GFR calc Af Amer: >60 mL/min (ref >60)
GFR calc Af Amer: >60 mL/min (ref >60)
GFR calc non Af Amer: >60 mL/min (ref >60)
GFR calc non Af Amer: 58 mL/min — ABNORMAL LOW (ref >60)
Glucose, Bld: 244 mg/dL — ABNORMAL HIGH (ref 70–99)
Glucose, Bld: 201 mg/dL — ABNORMAL HIGH (ref 70–99)
Potassium: 3.5 mmol/L (ref 3.5–5.1)
Potassium: 3.7 mmol/L (ref 3.5–5.1)
Sodium: 140 mmol/L (ref 135–145)
Sodium: 141 mmol/L (ref 135–145)

## 2020-01-06 LAB — LIPID PANEL
Cholesterol: 230 mg/dL — ABNORMAL HIGH (ref 0–200)
HDL: 57 mg/dL (ref >40)
LDL Cholesterol: 152 mg/dL — ABNORMAL HIGH (ref 0–99)
Total CHOL/HDL Ratio: 4 RATIO
Triglycerides: 103 mg/dL (ref <150)
VLDL: 21 mg/dL (ref 0–40)

## 2020-01-06 LAB — POC SARS CORONAVIRUS 2 AG -  ED: SARS Coronavirus 2 Ag: NEGATIVE

## 2020-01-06 LAB — ECHOCARDIOGRAM COMPLETE: Height: 66 in

## 2020-01-06 LAB — CBG MONITORING, ED
Glucose-Capillary: 231 mg/dL — ABNORMAL HIGH (ref 70–99)
Glucose-Capillary: 262 mg/dL — ABNORMAL HIGH (ref 70–99)

## 2020-01-06 LAB — HEMOGLOBIN A1C
Hgb A1c MFr Bld: 9 % — ABNORMAL HIGH (ref 4.8–5.6)
Mean Plasma Glucose: 211.6 mg/dL

## 2020-01-06 LAB — BRAIN NATRIURETIC PEPTIDE: B Natriuretic Peptide: 1441.7 pg/mL — ABNORMAL HIGH (ref 0.0–100.0)

## 2020-01-06 LAB — TSH: TSH: 1.037 u[IU]/mL (ref 0.350–4.500)

## 2020-01-06 LAB — TROPONIN I (HIGH SENSITIVITY)
Troponin I (High Sensitivity): 800 ng/L (ref <18)
Troponin I (High Sensitivity): 1304 ng/L (ref <18)

## 2020-01-06 MED ORDER — CANAGLIFLOZIN 100 MG PO TABS: 100.0000 mg | ORAL_TABLET | Freq: Every day | ORAL | Status: DC

## 2020-01-06 MED ORDER — ASPIRIN 81 MG PO CHEW
81.0000 mg | CHEWABLE_TABLET | Freq: Every day | ORAL | Status: DC
  Administered 2020-01-07: 10:00:00 81 mg via ORAL
  Filled 2020-01-06: qty 1

## 2020-01-06 MED ORDER — SODIUM CHLORIDE 0.9% FLUSH: 3.0000 mL | INTRAVENOUS | Status: DC | PRN

## 2020-01-06 MED ORDER — METFORMIN HCL 500 MG PO TABS
1000.0000 mg | ORAL_TABLET | Freq: Two times a day (BID) | ORAL | Status: DC
  Administered 2020-01-06 – 2020-01-07 (×2): 1000 mg via ORAL
  Filled 2020-01-06 (×2): qty 2

## 2020-01-06 MED ORDER — LOSARTAN POTASSIUM 50 MG PO TABS
100.0000 mg | ORAL_TABLET | Freq: Every day | ORAL | Status: DC
  Administered 2020-01-06 – 2020-01-07 (×2): 100 mg via ORAL
  Filled 2020-01-06 (×2): qty 2

## 2020-01-06 MED ORDER — FUROSEMIDE 10 MG/ML IJ SOLN
40.0000 mg | Freq: Once | INTRAMUSCULAR | Status: AC
  Administered 2020-01-06: 12:00:00 40 mg via INTRAVENOUS
  Filled 2020-01-06: qty 4

## 2020-01-06 MED ORDER — HYDROCHLOROTHIAZIDE 12.5 MG PO CAPS
12.5000 mg | ORAL_CAPSULE | Freq: Every day | ORAL | Status: DC
  Filled 2020-01-06: qty 1

## 2020-01-06 MED ORDER — ASPIRIN 81 MG PO CHEW
324.0000 mg | CHEWABLE_TABLET | Freq: Once | ORAL | Status: AC
  Administered 2020-01-06: 14:00:00 324 mg via ORAL
  Filled 2020-01-06: qty 4

## 2020-01-06 MED ORDER — ONDANSETRON HCL 4 MG/2ML IJ SOLN
4.0000 mg | Freq: Once | INTRAMUSCULAR | Status: AC
  Administered 2020-01-06: 11:00:00 4 mg via INTRAVENOUS
  Filled 2020-01-06: qty 2

## 2020-01-06 MED ORDER — HEPARIN BOLUS VIA INFUSION
2000.0000 [IU] | Freq: Once | INTRAVENOUS | Status: AC
  Administered 2020-01-06: 23:00:00 2000 [IU] via INTRAVENOUS
  Filled 2020-01-06: qty 2000

## 2020-01-06 MED ORDER — INSULIN ASPART 100 UNIT/ML ~~LOC~~ SOLN: 0.0000 [IU] | Freq: Three times a day (TID) | SUBCUTANEOUS | Status: DC

## 2020-01-06 MED ORDER — ENOXAPARIN SODIUM 40 MG/0.4ML ~~LOC~~ SOLN
40.0000 mg | SUBCUTANEOUS | Status: DC
  Administered 2020-01-06: 15:00:00 40 mg via SUBCUTANEOUS
  Filled 2020-01-06: qty 0.4

## 2020-01-06 MED ORDER — NITROGLYCERIN IN D5W 200-5 MCG/ML-% IV SOLN: 0.0000 ug/min | INTRAVENOUS | Status: DC

## 2020-01-06 MED ORDER — ATORVASTATIN CALCIUM 40 MG PO TABS
40.0000 mg | ORAL_TABLET | Freq: Every day | ORAL | Status: DC
  Administered 2020-01-06: 19:00:00 40 mg via ORAL
  Filled 2020-01-06: qty 1

## 2020-01-06 MED ORDER — ONDANSETRON HCL 4 MG/2ML IJ SOLN: 4.0000 mg | Freq: Four times a day (QID) | INTRAMUSCULAR | Status: DC | PRN

## 2020-01-06 MED ORDER — LINAGLIPTIN 5 MG PO TABS
5.0000 mg | ORAL_TABLET | Freq: Every day | ORAL | Status: DC
  Filled 2020-01-06: qty 1

## 2020-01-06 MED ORDER — ALBUTEROL SULFATE (2.5 MG/3ML) 0.083% IN NEBU: 2.5000 mg | INHALATION_SOLUTION | RESPIRATORY_TRACT | Status: DC | PRN

## 2020-01-06 MED ORDER — ACETAMINOPHEN 325 MG PO TABS: 650.0000 mg | ORAL_TABLET | ORAL | Status: DC | PRN

## 2020-01-06 MED ORDER — HEPARIN (PORCINE) 25000 UT/250ML-% IV SOLN
1000.0000 [IU]/h | INTRAVENOUS | Status: DC
  Administered 2020-01-06: 1000 [IU]/h via INTRAVENOUS
  Filled 2020-01-06: qty 250

## 2020-01-06 MED ORDER — SODIUM CHLORIDE 0.9 % IV SOLN: 250.0000 mL | INTRAVENOUS | Status: DC | PRN

## 2020-01-06 MED ORDER — SODIUM CHLORIDE 0.9% FLUSH
3.0000 mL | Freq: Two times a day (BID) | INTRAVENOUS | Status: DC
  Administered 2020-01-06: 20:00:00 3 mL via INTRAVENOUS

## 2020-01-06 MED ORDER — NITROGLYCERIN IN D5W 200-5 MCG/ML-% IV SOLN
0.0000 ug/min | INTRAVENOUS | Status: DC
  Administered 2020-01-06: 11:00:00 15 ug/min via INTRAVENOUS
  Filled 2020-01-06 (×2): qty 250

## 2020-01-06 MED ORDER — GUAIFENESIN-DM 100-10 MG/5ML PO SYRP: 5.0000 mL | ORAL_SOLUTION | ORAL | Status: DC | PRN

## 2020-01-06 NOTE — Consult Note (Signed)
Cardiology Consultation:  Patient ID: TEYONNA PLAISTED MRN: [id number]; DOB: 05/10/1946  Admit date: 01/06/2020 Date of Consult: 01/06/2020  Primary Care Provider: Patient, No Pcp Per Primary Cardiologist: No primary care provider on file.   Patient Profile:  Bianca [name] is a 74 y.o. female with a hx of hypertension, diabetes who is being seen today for the evaluation of hypertensive crisis/elevated troponin at the request of Dorie Rank, MD.  History of Present Illness:  Bianca [name] presents with 1 day history of shortness of breath.  She reports she try to go to sleep last night and was short of breath.  She reports some occasional chest tightness with deep breathing.  She reports she did not sleep well and then her shortness of breath worsened to the point where she had to call 911.  Here in the emergency room her blood pressure was severely elevated 226/133.  Chest x-ray showed flash pulmonary edema.  She also had an elevated BNP around 1400.  Troponin was elevated to 412.  She was placed on a nonrebreather and started on nitroglycerin drip with improvement in symptoms.  She was also given 40 mg IV Lasix.  WBC 12.3.  Hemoglobin 13.2.  EKG demonstrates sinus tachycardia right bundle branch block, no acute ischemic changes.  At the time my examination she is on 2 L nasal cannula breathing comfortably.  She denies any chest pain or shortness of breath.  She has put out nearly a liter on my review of her urine output in the room.    Of note, she has stopped seeing a regular physician.  She reports she is had incidents where she did not trust the physicians which she saw.  She is basic on urgent care.  Her most recent A1c 2 years ago was 14.4.  She has been told she has had high blood pressure but has not taken any medication for this and what appears to be years.  Heart Pathway Score:       Past Medical History: Past Medical History:  Diagnosis Date  . Diabetes mellitus without complication  ([place])   . Hypertension     Past Surgical History: No past surgical history on file.   Home Medications:  Prior to Admission medications   Medication Sig Start Date End Date Taking? Authorizing Provider  metFORMIN (GLUCOPHAGE) 1000 MG tablet TAKE 1 TABLET(1000 MG) BY MOUTH TWICE DAILY Patient taking differently: Take 1,000 mg by mouth 2 (two) times daily with a meal.  08/27/19  Yes Stallings, Zoe A, MD  acetaminophen (TYLENOL) 500 MG tablet Take 1 tablet (500 mg total) by mouth every 6 (six) hours as needed. Patient not taking: Reported on 08/03/2018 06/18/17   Rodell Perna A, PA-C  Blood Pressure Monitoring (ADULT BLOOD PRESSURE CUFF LG) KIT Use to check blood pressure twice a day, keep a running log of your blood pressures Patient not taking: Reported on 08/03/2018 01/31/15   Linton Flemings, MD  glucose blood (CONTOUR NEXT TEST) test strip Use as instructed Patient not taking: Reported on 08/03/2018 02/24/17   McVey, Gelene Mink, PA-C  glucose monitoring kit (FREESTYLE) monitoring kit 1 each by Does not apply route 2 (two) times daily. Patient not taking: Reported on 08/03/2018 01/18/14   Charlann Lange, PA-C  losartan (COZAAR) 50 MG tablet Take 50 mg by mouth daily. 11/26/19   [provider]  [place] USE TO TEST BLOOD SUGAR ONCE DAILY Patient not taking: Reported on 08/03/2018 02/25/17   Juanda Crumble  Whitney, PA-C    Inpatient Medications: Scheduled Meds: . enoxaparin (LOVENOX) injection  40 mg Subcutaneous Q24H  . hydrochlorothiazide  12.5 mg Oral Daily  . insulin aspart  0-9 Units Subcutaneous TID WC  . linagliptin  5 mg Oral Daily  . losartan  100 mg Oral Daily  . metFORMIN  1,000 mg Oral BID WC  . sodium chloride flush  3 mL Intravenous Q12H   Continuous Infusions: . sodium chloride    . nitroGLYCERIN 25 mcg/min (01/06/20 1117)   PRN Meds: sodium chloride, acetaminophen, ondansetron (ZOFRAN) IV, sodium chloride flush  Allergies:    Allergies    Allergen Reactions  . Tramadol Nausea And Vomiting    Social History:   Social History   Socioeconomic History  . Marital status: Divorced    Spouse name: Not on file  . Number of children: Not on file  . Years of education: Not on file  . Highest education level: Not on file  Occupational History  . Not on file  Tobacco Use  . Smoking status: Current Every Day Smoker    Packs/day: 0.50    Types: Cigarettes  . Smokeless tobacco: Never Used  Substance and Sexual Activity  . Alcohol use: No  . Drug use: No  . Sexual activity: Not on file  Other Topics Concern  . Not on file  Social History Narrative  . Not on file   Social Determinants of Health   Financial Resource Strain:   . Difficulty of Paying Living Expenses:   Food Insecurity:   . Worried About Charity fundraiser in the Last Year:   . Arboriculturist in the Last Year:   Transportation Needs:   . Film/video editor (Medical):   Marland Kitchen Lack of Transportation (Non-Medical):   Physical Activity:   . Days of Exercise per Week:   . Minutes of Exercise per Session:   Stress:   . Feeling of Stress :   Social Connections:   . Frequency of Communication with Friends and Family:   . Frequency of Social Gatherings with Friends and Family:   . Attends Religious Services:   . Active Member of Clubs or Organizations:   . Attends Archivist Meetings:   Marland Kitchen Marital Status:   Intimate Partner Violence:   . Fear of Current or Ex-Partner:   . Emotionally Abused:   Marland Kitchen Physically Abused:   . Sexually Abused:      Family History:    Family History  Problem Relation Age of Onset  . Diabetes Mother   . Diabetes Sister   . Diabetes Brother      ROS:  All other ROS reviewed and negative. Pertinent positives noted in the HPI.     Physical Exam/Data:   Vitals:   01/06/20 1330 01/06/20 1345 01/06/20 1415 01/06/20 1430  BP: (!) 186/107 (!) 177/108 (!) 187/112 (!) 165/101  Pulse: 96 98 (!) 102 100  Resp: (!)  22 (!) 25 (!) 28 (!) 22  Temp:      TempSrc:      SpO2: 97% 99% 92% 96%  Height:         Intake/Output Summary (Last 24 hours) at 01/06/2020 1435 Last data filed at 01/06/2020 1337 Gross per 24 hour  Intake --  Output 1100 ml  Net -1100 ml    Last 3 Weights 08/03/2018 06/18/2017 02/24/2017  Weight (lbs) 158 lb 152 lb 152 lb 9.6 oz  Weight (kg) 71.668 kg 68.947 kg  69.219 kg    Body mass index is 25.5 kg/m.  General: Well nourished, well developed, in no acute distress Head: Atraumatic, normal size  Eyes: PEERLA, EOMI  Neck: Supple, JVD 10-12 cm of water Endocrine: No thryomegaly Cardiac: 3 out of 6 systolic murmur Lungs: Diffuse crackles bilaterally Abd: Soft, nontender, no hepatomegaly  Ext: Trace edema Musculoskeletal: No deformities, BUE and BLE strength normal and equal Skin: Warm and dry, no rashes   Neuro: Alert and oriented to person, place, time, and situation, CNII-XII grossly intact, no focal deficits  Psych: Normal mood and affect   EKG:  The EKG was personally reviewed and demonstrates: Sinus tachycardia, heart rate 121, right bundle branch block noted, no acute ischemic changes Telemetry:  Telemetry was personally reviewed and demonstrates: Sinus tachycardia  Relevant CV Studies: none  Laboratory Data: High Sensitivity Troponin:   Recent Labs  Lab 01/06/20 1132  TROPONINIHS 462*     Cardiac EnzymesNo results for input(s): TROPONINI in the last 168 hours. No results for input(s): TROPIPOC in the last 168 hours.  Chemistry Recent Labs  Lab 01/06/20 1132  NA 140  K 3.5  CL 105  CO2 23  GLUCOSE 244*  BUN 13  CREATININE 0.88  CALCIUM 8.8*  GFRNONAA >60  GFRAA >60  ANIONGAP 12    No results for input(s): PROT, ALBUMIN, AST, ALT, ALKPHOS, BILITOT in the last 168 hours. Hematology Recent Labs  Lab 01/06/20 1132  WBC 12.3*  RBC 4.94  HGB 13.2  HCT 43.9  MCV 88.9  MCH 26.7  MCHC 30.1  RDW 15.0  PLT 248   BNP Recent Labs  Lab 01/06/20 1132   BNP 1,441.7*    DDimer No results for input(s): DDIMER in the last 168 hours.  Radiology/Studies:  DG Chest Port 1 View  Result Date: 01/06/2020 CLINICAL DATA:  Dyspnea EXAM: PORTABLE CHEST 1 VIEW COMPARISON:  June 18, 2017 FINDINGS: The mediastinal contour is normal. Heart size is slightly enlarged. Patchy consolidation of both lung bases, right greater than left are noted. Increased pulmonary interstitium is identified bilaterally. No acute abnormality is identified in the osseous structures. IMPRESSION: 1. Mild interstitial edema. 2. Patchy consolidation of both lung bases, right greater than left, pneumonia is not excluded. Electronically Signed   By: Abelardo Diesel M.D.   On: 01/06/2020 11:52    Assessment and Plan:  1. Hypertensive crisis: She presents with pulmonary edema related to blood pressure 226/133.  She apparently has stopped taking any blood pressure medications due to distrust of physicians.  Chest x-ray showed pulmonary edema.  She was started on nitroglycerin drip and given 40 mg IV Lasix with improvement in symptoms.  She does not appear that volume up on my examination.  I suspect most of this was just related to hypertensive crisis.  She was given losartan 100 mg and hydrochlorothiazide in the emergency room.  I think we will continue to wean the nitroglycerin drip and add antihypertensive medications.  I like we will add Coreg tomorrow.  Will not 1 drop her blood pressure too greatly in the next 24 hours.  She will need an echocardiogram. 2. Elevated troponin: I suspect this is demand ischemia in the setting of hypertensive crisis.  She has no chest pain.  No symptoms to suggest ischemia.  We will continue to trend troponins.  She was given 324 aspirin in the emergency room we will continue 81 mg daily.  I have added Lipitor 40 mg daily.  We will hold  on heparin drip for now.  Should her next troponins rise meaningfully we will likely restart this.  Again I highly suspect this  is related to profound hypertension. 3. Diabetes: We need to check an A1c and see how bad her diabetes is.  Apparently she stopped taking all medications a few years ago.  She is definitely high risk for CAD.  She will need some sort of ischemia valuation if not stress test coronary CTA.  If her troponins are significantly rising we likely will just do a heart cath while she is here. 4. Tobacco abuse: She reports she smokes since she was 66.  Nearly 50-pack-year.  Apparently she quit last night.  High risk for CAD.  For questions or updates, please contact [place] Please consult [email or web address] for contact info under   Signed, [place] T. Audie Box, [place]  01/06/2020 2:35 PM

## 2020-01-06 NOTE — ED Provider Notes (Addendum)
[facility] EMERGENCY DEPARTMENT Provider Note   CSN: [id number] Arrival date & time: 01/06/20  1100     History Chief Complaint  Patient presents with  . Respiratory Distress    Bianca [name] is a 74 y.o. female.  HPI   Patient presents emergency room for evaluation of acute shortness of breath.  Patient states she does not have a history of any significant medical problems however when asked again she does have a history of diabetes and hypertension.  Patient states she is not taking any medications although they have been prescribed for her.  Patient is complaining of severe shortness of breath.  She is coughing spitting up clear mucus.  She denies any fevers.  Her symptoms are severe and she barely feels like she cannot catch her breath.  It gets worse whenever she tries to lie flat.  Patient does smoke.  No known fevers.  No myalgias.  No abdominal pain.  Past Medical History:  Diagnosis Date  . Diabetes mellitus without complication ([place])   . Hypertension     Patient Active Problem List   Diagnosis Date Noted  . Noncompliance with medication regimen 08/17/2018  . Noncompliance with diagnostic testing 08/17/2018  . Type 2 diabetes mellitus ([place]) 04/17/2008  . TOBACCO ABUSE 04/17/2008    No past surgical history on file.   OB History   No obstetric history on file.     Family History  Problem Relation Age of Onset  . Diabetes Mother   . Diabetes Sister   . Diabetes Brother     Social History   Tobacco Use  . Smoking status: Current Every Day Smoker    Packs/day: 0.50    Types: Cigarettes  . Smokeless tobacco: Never Used  Substance Use Topics  . Alcohol use: No  . Drug use: No    Home Medications Prior to Admission medications   Medication Sig Start Date End Date Taking? Authorizing Provider  metFORMIN (GLUCOPHAGE) 1000 MG tablet TAKE 1 TABLET(1000 MG) BY MOUTH TWICE DAILY Patient taking differently: Take 1,000 mg by mouth 2  (two) times daily with a meal.  08/27/19  Yes Stallings, Zoe A, MD  acetaminophen (TYLENOL) 500 MG tablet Take 1 tablet (500 mg total) by mouth every 6 (six) hours as needed. Patient not taking: Reported on 08/03/2018 06/18/17   Rodell Perna A, PA-C  Blood Pressure Monitoring (ADULT BLOOD PRESSURE CUFF LG) KIT Use to check blood pressure twice a day, keep a running log of your blood pressures Patient not taking: Reported on 08/03/2018 01/31/15   Linton Flemings, MD  glucose blood (CONTOUR NEXT TEST) test strip Use as instructed Patient not taking: Reported on 08/03/2018 02/24/17   McVey, Gelene Mink, PA-C  glucose monitoring kit (FREESTYLE) monitoring kit 1 each by Does not apply route 2 (two) times daily. Patient not taking: Reported on 08/03/2018 01/18/14   Charlann Lange, PA-C  losartan (COZAAR) 50 MG tablet Take 50 mg by mouth daily. 11/26/19   [provider]  [place] USE TO TEST BLOOD SUGAR ONCE DAILY Patient not taking: Reported on 08/03/2018 02/25/17   McVey, Gelene Mink, PA-C    Allergies    Tramadol  Review of Systems   Review of Systems  All other systems reviewed and are negative.   Physical Exam Updated Vital Signs BP (!) 178/101   Pulse 95   Temp 97.9 F (36.6 C) (Oral)   Resp (!) 23   Ht 1.676 m (5'  6")   SpO2 97%   BMI 25.50 kg/m   Physical Exam Vitals and nursing note reviewed.  Constitutional:      General: She is in acute distress.     Appearance: She is well-developed. She is ill-appearing and diaphoretic.  HENT:     Head: Normocephalic and atraumatic.     Right Ear: External ear normal.     Left Ear: External ear normal.  Eyes:     General: No scleral icterus.       Right eye: No discharge.        Left eye: No discharge.     Conjunctiva/sclera: Conjunctivae normal.  Neck:     Trachea: No tracheal deviation.     Comments:  neck veins distended Cardiovascular:     Rate and Rhythm: Regular rhythm. Tachycardia present.    Pulmonary:     Effort: Respiratory distress present.     Breath sounds: No stridor. Rales present. No wheezing.     Comments: Able to speak in several word sentences but increased effort and retractions noted Abdominal:     General: Bowel sounds are normal. There is no distension.     Palpations: Abdomen is soft.     Tenderness: There is no abdominal tenderness. There is no guarding or rebound.  Musculoskeletal:        General: No tenderness.     Cervical back: Neck supple.     Comments: Mild edema bilateral lower extremities  Skin:    General: Skin is warm.     Findings: No rash.  Neurological:     Mental Status: She is alert.     Cranial Nerves: No cranial nerve deficit (no facial droop, extraocular movements intact, no slurred speech).     Sensory: No sensory deficit.     Motor: No abnormal muscle tone or seizure activity.     Coordination: Coordination normal.     Comments: Alert, not disoriented, not lethargic     ED Results / Procedures / Treatments   Labs (all labs ordered are listed, but only abnormal results are displayed) Labs Reviewed  BASIC METABOLIC PANEL - Abnormal; Notable for the following components:      Result Value   Glucose, Bld 244 (*)    Calcium 8.8 (*)    All other components within normal limits  CBC - Abnormal; Notable for the following components:   WBC 12.3 (*)    All other components within normal limits  BRAIN NATRIURETIC PEPTIDE - Abnormal; Notable for the following components:   B Natriuretic Peptide 1,441.7 (*)    All other components within normal limits  CBG MONITORING, ED - Abnormal; Notable for the following components:   Glucose-Capillary 231 (*)    All other components within normal limits  TROPONIN I (HIGH SENSITIVITY) - Abnormal; Notable for the following components:   Troponin I (High Sensitivity) 462 (*)    All other components within normal limits  POC SARS CORONAVIRUS 2 AG -  ED    EKG EKG  Interpretation  Date/Time:  Sunday January 06 2020 11:06:13 EDT Ventricular Rate:  121 PR Interval:    QRS Duration: 149 QT Interval:  338 QTC Calculation: 480 R Axis:   -157 Text Interpretation: Sinus tachycardia Right bundle branch block Baseline wander in lead(s) I III aVR aVL aVF V3 V4 V5 Since last tracing rate faster Confirmed by Dorie Rank [phone]) on 01/06/2020 11:10:02 AM   Radiology DG Chest Port 1 View  Result Date: 01/06/2020 CLINICAL  DATA:  Dyspnea EXAM: PORTABLE CHEST 1 VIEW COMPARISON:  June 18, 2017 FINDINGS: The mediastinal contour is normal. Heart size is slightly enlarged. Patchy consolidation of both lung bases, right greater than left are noted. Increased pulmonary interstitium is identified bilaterally. No acute abnormality is identified in the osseous structures. IMPRESSION: 1. Mild interstitial edema. 2. Patchy consolidation of both lung bases, right greater than left, pneumonia is not excluded. Electronically Signed   By: Abelardo Diesel M.D.   On: 01/06/2020 11:52    Procedures .Critical Care Performed by: Dorie Rank, MD Authorized by: Dorie Rank, MD   Critical care provider statement:    Critical care time (minutes):  45   Critical care was time spent personally by me on the following activities:  Discussions with consultants, evaluation of patient's response to treatment, examination of patient, ordering and performing treatments and interventions, ordering and review of laboratory studies, ordering and review of radiographic studies, pulse oximetry, re-evaluation of patient's condition, obtaining history from patient or surrogate and review of old charts   (including critical care time)  Medications Ordered in ED Medications  nitroGLYCERIN 50 mg in dextrose 5 % 250 mL (0.2 mg/mL) infusion (25 mcg/min Intravenous Rate/Dose Change 01/06/20 1117)  aspirin chewable tablet 324 mg (has no administration in time range)  ondansetron (ZOFRAN) injection 4 mg (4 mg  Intravenous Given 01/06/20 1114)  furosemide (LASIX) injection 40 mg (40 mg Intravenous Given 01/06/20 1206)    ED Course  I have reviewed the triage vital signs and the nursing notes.  Pertinent labs & imaging results that were available during my care of the patient were reviewed by me and considered in my medical decision making (see chart for details).  Clinical Course as of Jan 05 1330  Sun Jan 06, 2020  1120 Patient's presentation is suggestive of acute CHF exacerbation.  Likely related to her significant hypertension.  I have ordered nitroglycerin drip.  Covid test, chest x-ray and other laboratory tests ordered.  Likely will add on diuretic.  Hold off on BiPAP as she is frequently gagging and pulling her mask off antiemetic ordered   [JK]  1157 Chest x-ray suggest mild interstitial edema.  Initial Covid test is negative   [JK]  1158 Patient is breathing much easier now.  She is smiling and states she feels much better   [JK]  1249 Patient's laboratory tests are notable for an elevation in her troponin as well as her BNP.  Patient is on a nitroglycerin drip and she has been given Lasix.  She is feeling better.  She does remain hypertensive.  1l liter urine output   [JK]  1331 DIscussed with Cardiology.  Will consult on pt.  Requests medical admission   [JK]    Clinical Course User Index [JK] Dorie Rank, MD   MDM Rules/Calculators/A&P                      Patient presented with acute shortness of breath.  Patient's presentation was concerning for an acute CHF exacerbation.  Patient was treated with nitroglycerin and Lasix.  She has had significant response in her symptoms.  Patient does have an elevated BNP.  She also has an elevated troponin.  She denies chest pain I suspect her troponin is related to her CHF exacerbation but we will need to continue to monitor to make sure she is not having any signs of cardiac ischemia.  I will consult with cardiology for admission and further  treatment Final Clinical Impression(s) / ED Diagnoses Final diagnoses:  Acute on chronic congestive heart failure, unspecified heart failure type [facility])      Dorie Rank, MD 01/06/20 1256    Dorie Rank, MD 01/06/20 1331

## 2020-01-06 NOTE — H&P (Addendum)
History and Physical    Bianca [name]:[id number] DOB: May 04, 1946 DOA: 01/06/2020  PCP: Patient, No Pcp Per   Patient coming from: Home  I have personally briefly reviewed patient's old medical records in [place]  Chief Complaint: SOB  HPI: Bianca [name] is a 74 y.o. female with medical history significant of IIDM, HTN not compliant with meds, presented with new onset of SOB. Symptoms started last night when lying down, could not lie flat and woke up two time for SOB, also started to cough up clear phlegm occasionally, no chest pain no fever or chills. She was told about diagnosis of HTN 5 years ago and recent, she took her BP meds briefly and stopped herself 5 years ago, and this time she did not even take any. ED Course: SBP>200, hypoxic with O2 sat 70s, elevated trop, cardio aware, nitro drip started.  Review of Systems: As per HPI otherwise 10 point review of systems negative.    Past Medical History:  Diagnosis Date  . Diabetes mellitus without complication ([place])   . Hypertension     No past surgical history on file.   reports that she has been smoking cigarettes. She has been smoking about 0.50 packs per day. She has never used smokeless tobacco. She reports that she does not drink alcohol or use drugs.  Allergies  Allergen Reactions  . Tramadol Nausea And Vomiting    Family History  Problem Relation Age of Onset  . Diabetes Mother   . Diabetes Sister   . Diabetes Brother      Prior to Admission medications   Medication Sig Start Date End Date Taking? Authorizing Provider  metFORMIN (GLUCOPHAGE) 1000 MG tablet TAKE 1 TABLET(1000 MG) BY MOUTH TWICE DAILY Patient taking differently: Take 1,000 mg by mouth 2 (two) times daily with a meal.  08/27/19  Yes Stallings, Zoe A, MD  acetaminophen (TYLENOL) 500 MG tablet Take 1 tablet (500 mg total) by mouth every 6 (six) hours as needed. Patient not taking: Reported on 08/03/2018 06/18/17   Rodell Perna A, PA-C   Blood Pressure Monitoring (ADULT BLOOD PRESSURE CUFF LG) KIT Use to check blood pressure twice a day, keep a running log of your blood pressures Patient not taking: Reported on 08/03/2018 01/31/15   Linton Flemings, MD  glucose blood (CONTOUR NEXT TEST) test strip Use as instructed Patient not taking: Reported on 08/03/2018 02/24/17   McVey, Gelene Mink, PA-C  glucose monitoring kit (FREESTYLE) monitoring kit 1 each by Does not apply route 2 (two) times daily. Patient not taking: Reported on 08/03/2018 01/18/14   Charlann Lange, PA-C  losartan (COZAAR) 50 MG tablet Take 50 mg by mouth daily. 11/26/19   [provider]  [place] USE TO TEST BLOOD SUGAR ONCE DAILY Patient not taking: Reported on 08/03/2018 02/25/17   Dorise Hiss, PA-C    Physical Exam: Vitals:   01/06/20 1315 01/06/20 1330 01/06/20 1345 01/06/20 1415  BP: (!) 178/101 (!) 186/107 (!) 177/108 (!) 187/112  Pulse: 95 96 98 (!) 102  Resp: (!) 23 (!) 22 (!) 25 (!) 28  Temp:      TempSrc:      SpO2: 97% 97% 99% 92%  Height:        Constitutional: NAD, calm, comfortable Vitals:   01/06/20 1315 01/06/20 1330 01/06/20 1345 01/06/20 1415  BP: (!) 178/101 (!) 186/107 (!) 177/108 (!) 187/112  Pulse: 95 96 98 (!) 102  Resp: (!) 23 (!) 22 (!)  25 (!) 28  Temp:      TempSrc:      SpO2: 97% 97% 99% 92%  Height:       Eyes: PERRL, lids and conjunctivae normal ENMT: Mucous membranes are moist. Posterior pharynx clear of any exudate or lesions.Normal dentition.  Neck: normal, supple, no masses, no thyromegaly Respiratory: fine crackles to the mid level of the lungs.Increasing  respiratory effort. No accessory muscle use.  Cardiovascular: Regular rate and rhythm, no murmurs / rubs / gallops. No extremity edema. 2+ pedal pulses. No carotid bruits.  Abdomen: no tenderness, no masses palpated. No hepatosplenomegaly. Bowel sounds positive.  Musculoskeletal: no clubbing / cyanosis. No joint deformity  upper and lower extremities. Good ROM, no contractures. Normal muscle tone.  Skin: no rashes, lesions, ulcers. No induration Neurologic: CN 2-12 grossly intact. Sensation intact, DTR normal. Strength 5/5 in all 4.  Psychiatric: Normal judgment and insight. Alert and oriented x 3. Normal mood.     Labs on Admission: I have personally reviewed following labs and imaging studies  CBC: Recent Labs  Lab 01/06/20 1132  WBC 12.3*  HGB 13.2  HCT 43.9  MCV 88.9  PLT 825   Basic Metabolic Panel: Recent Labs  Lab 01/06/20 1132  NA 140  K 3.5  CL 105  CO2 23  GLUCOSE 244*  BUN 13  CREATININE 0.88  CALCIUM 8.8*   GFR: CrCl cannot be calculated (Unknown ideal weight.). Liver Function Tests: No results for input(s): AST, ALT, ALKPHOS, BILITOT, PROT, ALBUMIN in the last 168 hours. No results for input(s): LIPASE, AMYLASE in the last 168 hours. No results for input(s): AMMONIA in the last 168 hours. Coagulation Profile: No results for input(s): INR, PROTIME in the last 168 hours. Cardiac Enzymes: No results for input(s): CKTOTAL, CKMB, CKMBINDEX, TROPONINI in the last 168 hours. BNP (last 3 results) No results for input(s): PROBNP in the last 8760 hours. HbA1C: No results for input(s): HGBA1C in the last 72 hours. CBG: Recent Labs  Lab 01/06/20 1115  GLUCAP 231*   Lipid Profile: No results for input(s): CHOL, HDL, LDLCALC, TRIG, CHOLHDL, LDLDIRECT in the last 72 hours. Thyroid Function Tests: No results for input(s): TSH, T4TOTAL, FREET4, T3FREE, THYROIDAB in the last 72 hours. Anemia Panel: No results for input(s): VITAMINB12, FOLATE, FERRITIN, TIBC, IRON, RETICCTPCT in the last 72 hours. Urine analysis:    Component Value Date/Time   COLORURINE YELLOW 02/04/2015 1350   APPEARANCEUR CLEAR 02/04/2015 1350   LABSPEC 1.018 02/04/2015 1350   PHURINE 6.0 02/04/2015 1350   GLUCOSEU NEGATIVE 02/04/2015 1350   HGBUR NEGATIVE 02/04/2015 1350   BILIRUBINUR neg 02/20/2015 1040    KETONESUR 15 (A) 02/04/2015 1350   PROTEINUR 30 02/20/2015 1040   PROTEINUR NEGATIVE 02/04/2015 1350   UROBILINOGEN 0.2 02/20/2015 1040   UROBILINOGEN 1.0 02/04/2015 1350   NITRITE neg 02/20/2015 1040   NITRITE NEGATIVE 02/04/2015 1350   LEUKOCYTESUR Negative 02/20/2015 1040    Radiological Exams on Admission: DG Chest Port 1 View  Result Date: 01/06/2020 CLINICAL DATA:  Dyspnea EXAM: PORTABLE CHEST 1 VIEW COMPARISON:  June 18, 2017 FINDINGS: The mediastinal contour is normal. Heart size is slightly enlarged. Patchy consolidation of both lung bases, right greater than left are noted. Increased pulmonary interstitium is identified bilaterally. No acute abnormality is identified in the osseous structures. IMPRESSION: 1. Mild interstitial edema. 2. Patchy consolidation of both lung bases, right greater than left, pneumonia is not excluded. Electronically Signed   By: Mallie Darting.D.  On: 01/06/2020 11:52    EKG: Independently reviewed. Sinus tachy, flattening of ST seg on V2-3  Assessment/Plan Active Problems:   Acute diastolic CHF (congestive heart failure) (HCC)   CHF (congestive heart failure) (HCC)  Acute hypoxic respiratory failure 2nd to acute pulmonary edema, significantly improved with one dose of lasix in ED with > 1 liter of urine outpt. PRN Lasix and start PO BP meds Suspect this is diastolic CHF from uncontrolled HTN Wean down her O2 Echo  HTN emergency On drip, aiming for less than 25% BP drop in 24 hours Cardio on board HCTZ and go up with her Losartan  Positive troponin Likely from CHF decompensation No chest pain, will trend this afternoon and in AM  Leukocytosis Infiltrates on X-ray probably due to CHF, will hold off ABX, recheck Xray in AM  IIDM A1C more than 14 two years ago Pt however reluctant for insulin Consider add SGLT2 on discharge  DVT prophylaxis: Lovenox Code Status: Full Family Communication:Daughter at bedside Disposition Plan:  Go home in 1-2 days Consults called: Cardiology Admission status: PCU   Lequita Halt MD [facility] Pager [phone]   01/06/2020, 2:22 PM

## 2020-01-06 NOTE — ED Notes (Signed)
Dinner ordered 

## 2020-01-06 NOTE — ED Notes (Signed)
Family at bedside. 

## 2020-01-06 NOTE — Progress Notes (Signed)
ANTICOAGULATION CONSULT NOTE - Initial Consult  Pharmacy Consult for IV heparin Indication: chest pain/ACS  Allergies  Allergen Reactions  . Tramadol Nausea And Vomiting    Patient Measurements: Height: 5\' 6"  (167.6 cm) Weight: 158 lb 6.4 oz (71.8 kg) IBW/kg (Calculated) : 59.3 Heparin Dosing Weight: 71.8 kg  Vital Signs: Temp: 98 F (36.7 C) (03/28 2024) Temp Source: Oral (03/28 2024) BP: 127/69 (03/28 2024) Pulse Rate: 99 (03/28 2024)  Labs: Recent Labs    01/06/20 1132 01/06/20 1448 01/06/20 1727  HGB 13.2 13.8  --   HCT 43.9 44.8  --   PLT 248 238  --   CREATININE 0.88 0.97  --   TROPONINIHS 462* 800* 1,304*    Estimated Creatinine Clearance: 51.6 mL/min (by C-G formula based on SCr of 0.97 mg/dL).   Medical History: Past Medical History:  Diagnosis Date  . Diabetes mellitus without complication (HCC)   . Hypertension     Medications:  Infusions:  . sodium chloride    . heparin    . nitroGLYCERIN 40 mcg/min (01/06/20 1854)    Assessment: 74 yo female with rising troponins, suspicion for ACS.  Pharmacy asked to start IV Heparin.  No anticoagulants noted PTA.  Received lovenox prophylaxis dose around 1445 pm today.  Goal of Therapy:  Heparin level 0.3-0.7 units/ml Monitor platelets by anticoagulation protocol: Yes   Plan:  1. Small heparin bolus 2000 units x 1 given recent lovenox 2. Heparin 1000 units/hr. 3. Check heparin level in 8 hrs. 4. Daily heparin level and CBC.  66, [facility] Clinical Pharmacist Phone [phone]  01/06/2020 9:23 PM

## 2020-01-06 NOTE — Progress Notes (Signed)
  Echocardiogram 2D Echocardiogram has been performed.  Bianca [name] 01/06/2020, 4:53 PM

## 2020-01-06 NOTE — Progress Notes (Signed)
Patient is refusing heparin. Educated patient on purpose, mechanism, and reason for need to no effect. Wants MD to explain. Cardiology and internal medicine paged.

## 2020-01-06 NOTE — ED Triage Notes (Signed)
Pt arrives to ED in respiratory distress on NRB for sudden onset of shortness of breath that started last night. Pt was found to be in 70's on room air. Pt is very hypertensive 205/115

## 2020-01-06 NOTE — Progress Notes (Signed)
Patient's daughter called for further information on heparin drip. Educated on the rationale, risks, and benefits of heparin therapy. Patient's daughter requested specific lab values for patient's troponins. Explained that discussion of lab values is outside of the nursing scope of practice. Patient's daughter verbalized "this makes me angry". Transferred call to charge nurse for further discussion.  Cardiology notified. MD states that the day shift rounding physicians can evaluate the need for heparin.  Rounded on patient. Patient was on the phone with her daughter. Explained that the heparin can be held pending a conversation with the day shift rounding physicians. Patient's daughter expressed understanding.  Patient then requested the heparin citing a desire to "go home tomorrow". Explained to patient that this cannot be guaranteed. Patient expressed understanding. Heparin is currently running (see MAR).

## 2020-01-06 NOTE — Progress Notes (Signed)
Cardiology notified of elevated troponins. New orders to follow.

## 2020-01-07 ENCOUNTER — Encounter (HOSPITAL_COMMUNITY): Payer: Self-pay | Admitting: Internal Medicine

## 2020-01-07 DIAGNOSIS — I214 Non-ST elevation (NSTEMI) myocardial infarction: Secondary | ICD-10-CM

## 2020-01-07 LAB — CBC
HCT: 37.1 % (ref 36.0–46.0)
Hemoglobin: 11.4 g/dL — ABNORMAL LOW (ref 12.0–15.0)
MCH: 26.6 pg (ref 26.0–34.0)
MCHC: 30.7 g/dL (ref 30.0–36.0)
MCV: 86.7 fL (ref 80.0–100.0)
Platelets: 200 10*3/uL (ref 150–400)
RBC: 4.28 MIL/uL (ref 3.87–5.11)
RDW: 14.6 % (ref 11.5–15.5)
WBC: 8.8 10*3/uL (ref 4.0–10.5)
nRBC: 0 % (ref 0.0–0.2)

## 2020-01-07 LAB — GLUCOSE, CAPILLARY
Glucose-Capillary: 147 mg/dL — ABNORMAL HIGH (ref 70–99)
Glucose-Capillary: 200 mg/dL — ABNORMAL HIGH (ref 70–99)

## 2020-01-07 LAB — BASIC METABOLIC PANEL
Anion gap: 10 (ref 5–15)
BUN: 13 mg/dL (ref 8–23)
CO2: 27 mmol/L (ref 22–32)
Calcium: 8 mg/dL — ABNORMAL LOW (ref 8.9–10.3)
Chloride: 105 mmol/L (ref 98–111)
Creatinine, Ser: 0.94 mg/dL (ref 0.44–1.00)
GFR calc Af Amer: >60 mL/min (ref >60)
GFR calc non Af Amer: 60 mL/min — ABNORMAL LOW (ref >60)
Glucose, Bld: 147 mg/dL — ABNORMAL HIGH (ref 70–99)
Potassium: 3.2 mmol/L — ABNORMAL LOW (ref 3.5–5.1)
Sodium: 142 mmol/L (ref 135–145)

## 2020-01-07 LAB — SARS CORONAVIRUS 2 (TAT 6-24 HRS): SARS Coronavirus 2: NEGATIVE

## 2020-01-07 LAB — HEPARIN LEVEL (UNFRACTIONATED): Heparin Unfractionated: 0.64 IU/mL (ref 0.30–0.70)

## 2020-01-07 LAB — TROPONIN I (HIGH SENSITIVITY)
Troponin I (High Sensitivity): 462 ng/L (ref <18)
Troponin I (High Sensitivity): 1532 ng/L (ref <18)
Troponin I (High Sensitivity): 1582 ng/L (ref <18)

## 2020-01-07 MED ORDER — CARVEDILOL 25 MG PO TABS: 25.0000 mg | ORAL_TABLET | Freq: Two times a day (BID) | ORAL | Status: DC

## 2020-01-07 MED ORDER — NIFEDIPINE ER OSMOTIC RELEASE 30 MG PO TB24
30.0000 mg | ORAL_TABLET | Freq: Every day | ORAL | Status: DC
  Filled 2020-01-07: qty 1

## 2020-01-07 MED ORDER — POTASSIUM CHLORIDE CRYS ER 20 MEQ PO TBCR: 40.0000 meq | EXTENDED_RELEASE_TABLET | Freq: Once | ORAL | Status: DC

## 2020-01-07 NOTE — Progress Notes (Signed)
Patient refuses chest x-ray. States she "does not want all that radiation in her body" and that "they can look at the chest x-ray taken two weeks ago". Educated patient. Patient currently speaking with daughter. Will continue to monitor.

## 2020-01-07 NOTE — Progress Notes (Signed)
Cardiology Progress Note  Patient ID: Bianca [name] MRN: [id number] DOB: 10-09-1946 Date of Encounter: 01/07/2020  Primary Cardiologist: No primary care provider on file.  Subjective  Troponins rising overnight.  No chest pain.  Blood pressure much better controlled today.  I discussed need for heart cath, she is adamantly refusing.  ROS:  All other ROS reviewed and negative. Pertinent positives noted in the HPI.     Inpatient Medications  Scheduled Meds: . aspirin  81 mg Oral Daily  . atorvastatin  40 mg Oral q1800  . carvedilol  25 mg Oral BID WC  . insulin aspart  0-9 Units Subcutaneous TID WC  . losartan  100 mg Oral Daily  . metFORMIN  1,000 mg Oral BID WC  . NIFEdipine  30 mg Oral Daily  . sodium chloride flush  3 mL Intravenous Q12H   Continuous Infusions: . sodium chloride    . heparin 1,000 Units/hr (01/06/20 2320)   PRN Meds: sodium chloride, acetaminophen, albuterol, guaiFENesin-dextromethorphan, ondansetron (ZOFRAN) IV, sodium chloride flush   Vital Signs   Vitals:   01/06/20 2024 01/07/20 0023 01/07/20 0523 01/07/20 0743  BP: 127/69 (!) 152/94 130/69 126/78  Pulse: 99 96 95 98  Resp: 16 16 16 17   Temp: 98 F (36.7 C) 98.2 F (36.8 C) 98.6 F (37 C) 98.4 F (36.9 C)  TempSrc: Oral Oral Oral Oral  SpO2: 99% 100% 98% 97%  Weight:   71.6 kg   Height:        Intake/Output Summary (Last 24 hours) at 01/07/2020 1018 Last data filed at 01/07/2020 0932 Gross per 24 hour  Intake 1019.14 ml  Output 2850 ml  Net -1830.86 ml   Last 3 Weights 01/07/2020 01/06/2020 08/03/2018  Weight (lbs) 157 lb 12.8 oz 158 lb 6.4 oz 158 lb  Weight (kg) 71.578 kg 71.85 kg 71.668 kg      Telemetry  Overnight telemetry shows normal sinus rhythm with heart rate in the 90s, sinus tachycardia also, which I personally reviewed.   ECG  The most recent ECG shows sinus tachycardia, right bundle branch block, likely first-degree AV block, which I personally reviewed.   Physical  Exam   Vitals:   01/06/20 2024 01/07/20 0023 01/07/20 0523 01/07/20 0743  BP: 127/69 (!) 152/94 130/69 126/78  Pulse: 99 96 95 98  Resp: 16 16 16 17   Temp: 98 F (36.7 C) 98.2 F (36.8 C) 98.6 F (37 C) 98.4 F (36.9 C)  TempSrc: Oral Oral Oral Oral  SpO2: 99% 100% 98% 97%  Weight:   71.6 kg   Height:         Intake/Output Summary (Last 24 hours) at 01/07/2020 1018 Last data filed at 01/07/2020 0932 Gross per 24 hour  Intake 1019.14 ml  Output 2850 ml  Net -1830.86 ml    Last 3 Weights 01/07/2020 01/06/2020 08/03/2018  Weight (lbs) 157 lb 12.8 oz 158 lb 6.4 oz 158 lb  Weight (kg) 71.578 kg 71.85 kg 71.668 kg    Body mass index is 25.47 kg/m.   General: Well nourished, well developed, in no acute distress Head: Atraumatic, normal size  Eyes: PEERLA, EOMI  Neck: Supple, no JVD Endocrine: No thryomegaly Cardiac: Normal S1, S2; RRR; no murmurs, rubs, or gallops Lungs: Rhonchi bilaterally Abd: Soft, nontender, no hepatomegaly  Ext: No edema, pulses 2+ Musculoskeletal: No deformities, BUE and BLE strength normal and equal Skin: Warm and dry, no rashes   Neuro: Alert and oriented to person, place, time,  and situation, CNII-XII grossly intact, no focal deficits  Psych: Normal mood and affect   Labs  High Sensitivity Troponin:   Recent Labs  Lab 01/06/20 1132 01/06/20 1448 01/06/20 1727 01/07/20 0610  TROPONINIHS 462* 800* 1,304* 1,532*     Cardiac EnzymesNo results for input(s): TROPONINI in the last 168 hours. No results for input(s): TROPIPOC in the last 168 hours.  Chemistry Recent Labs  Lab 01/06/20 1132 01/06/20 1448 01/07/20 0610  NA 140 141 142  K 3.5 3.7 3.2*  CL 105 102 105  CO2 23 26 27   GLUCOSE 244* 201* 147*  BUN 13 13 13   CREATININE 0.88 0.97 0.94  CALCIUM 8.8* 8.8* 8.0*  GFRNONAA >60 58* 60*  GFRAA >60 >60 >60  ANIONGAP 12 13 10     Hematology Recent Labs  Lab 01/06/20 1132 01/06/20 1448 01/07/20 0610  WBC 12.3* 9.5 8.8  RBC 4.94 5.07  4.28  HGB 13.2 13.8 11.4*  HCT 43.9 44.8 37.1  MCV 88.9 88.4 86.7  MCH 26.7 27.2 26.6  MCHC 30.1 30.8 30.7  RDW 15.0 14.9 14.6  PLT 248 238 200   BNP Recent Labs  Lab 01/06/20 1132  BNP 1,441.7*    DDimer No results for input(s): DDIMER in the last 168 hours.   Radiology  DG Chest Port 1 View  Result Date: 01/06/2020 CLINICAL DATA:  Dyspnea EXAM: PORTABLE CHEST 1 VIEW COMPARISON:  June 18, 2017 FINDINGS: The mediastinal contour is normal. Heart size is slightly enlarged. Patchy consolidation of both lung bases, right greater than left are noted. Increased pulmonary interstitium is identified bilaterally. No acute abnormality is identified in the osseous structures. IMPRESSION: 1. Mild interstitial edema. 2. Patchy consolidation of both lung bases, right greater than left, pneumonia is not excluded. Electronically Signed   By: Abelardo Diesel M.D.   On: 01/06/2020 11:52   ECHOCARDIOGRAM COMPLETE  Result Date: 01/06/2020    ECHOCARDIOGRAM REPORT   Patient Name:   Bianca [name] Date of Exam: 01/06/2020 Medical Rec #:  [id number]        Height:       66.0 in Accession #:    [id number]       Weight:       158.0 lb Date of Birth:  November 04, 1945        BSA:          1.809 m Patient Age:    74 years         BP:           161/83 mmHg Patient Gender: F                HR:           99 bpm. Exam Location:  Inpatient Procedure: 2D Echo Indications:    acute diastolic chf 621.30  History:        Patient has no prior history of Echocardiogram examinations.  Sonographer:    Johny Chess Referring Phys: [id number] [place]  1. Left ventricular ejection fraction, by estimation, is 50 to 55%. The left ventricle has low normal function. The left ventricle has no regional wall motion abnormalities. There is mild concentric left ventricular hypertrophy. Indeterminate diastolic filling due to E-A fusion.  2. Right ventricular systolic function is normal. The right ventricular size is normal.  Tricuspid regurgitation signal is inadequate for assessing PA pressure.  3. The mitral valve is grossly normal. Trivial mitral valve regurgitation.  4. The aortic valve is tricuspid.  Aortic valve regurgitation is not visualized. No aortic stenosis is present.  5. The inferior vena cava is normal in size with greater than 50% respiratory variability, suggesting right atrial pressure of 3 mmHg. FINDINGS  Left Ventricle: Left ventricular ejection fraction, by estimation, is 50 to 55%. The left ventricle has low normal function. The left ventricle has no regional wall motion abnormalities. The left ventricular internal cavity size was normal in size. There is mild concentric left ventricular hypertrophy. Indeterminate diastolic filling due to E-A fusion. Right Ventricle: The right ventricular size is normal. No increase in right ventricular wall thickness. Right ventricular systolic function is normal. Tricuspid regurgitation signal is inadequate for assessing PA pressure. Left Atrium: Left atrial size was normal in size. Right Atrium: Right atrial size was normal in size. Pericardium: Trivial pericardial effusion is present. Presence of pericardial fat pad. Mitral Valve: The mitral valve is grossly normal. Trivial mitral valve regurgitation. Tricuspid Valve: The tricuspid valve is grossly normal. Tricuspid valve regurgitation is mild . No evidence of tricuspid stenosis. Aortic Valve: The aortic valve is tricuspid. Aortic valve regurgitation is not visualized. No aortic stenosis is present. Pulmonic Valve: The pulmonic valve was grossly normal. Pulmonic valve regurgitation is trivial. No evidence of pulmonic stenosis. Aorta: The aortic root is normal in size and structure. Venous: The inferior vena cava is normal in size with greater than 50% respiratory variability, suggesting right atrial pressure of 3 mmHg. IAS/Shunts: The atrial septum is grossly normal. Additional Comments: There is a small pleural effusion in the  left lateral region.  LEFT VENTRICLE PLAX 2D LVIDd:         4.80 cm LVIDs:         3.60 cm LV PW:         1.20 cm LV IVS:        1.00 cm LVOT diam:     1.90 cm LV SV:         47 LV SV Index:   26 LVOT Area:     2.84 cm  LV Volumes (MOD) LV vol d, MOD A2C: 108.0 ml LV vol s, MOD A2C: 71.2 ml LV SV MOD A2C:     36.8 ml RIGHT VENTRICLE RV S prime:     11.00 cm/s TAPSE (M-mode): 2.0 cm LEFT ATRIUM             Index       RIGHT ATRIUM           Index LA diam:        4.00 cm 2.21 cm/m  RA Area:     13.00 cm LA Vol (A2C):   52.8 ml 29.18 ml/m RA Volume:   29.90 ml  16.53 ml/m LA Vol (A4C):   48.7 ml 26.92 ml/m LA Biplane Vol: 51.6 ml 28.52 ml/m  AORTIC VALVE LVOT Vmax:   109.00 cm/s LVOT Vmean:  63.100 cm/s LVOT VTI:    0.167 m  AORTA Ao Root diam: 3.10 cm  SHUNTS Systemic VTI:  0.17 m Systemic Diam: 1.90 cm Lennie Odor MD Electronically signed by Lennie Odor MD Signature Date/Time: 01/06/2020/5:19:20 PM    Final     Cardiac Studies  TTE 01/06/2020 1. Left ventricular ejection fraction, by estimation, is 50 to 55%. The  left ventricle has low normal function. The left ventricle has no regional  wall motion abnormalities. There is mild concentric left ventricular  hypertrophy. Indeterminate diastolic  filling due to E-A fusion.  2. Right ventricular systolic function is  normal. The right ventricular  size is normal. Tricuspid regurgitation signal is inadequate for assessing  PA pressure.  3. The mitral valve is grossly normal. Trivial mitral valve  regurgitation.  4. The aortic valve is tricuspid. Aortic valve regurgitation is not  visualized. No aortic stenosis is present.  5. The inferior vena cava is normal in size with greater than 50%  respiratory variability, suggesting right atrial pressure of 3 mmHg.   Patient Profile  Bianca [name] is a 74 y.o. female with history of diabetes, hypertension who was admitted on 01/06/2020 for hypertensive emergency and flash pulmonary  edema.  Assessment & Plan   1.  Hypertensive emergency -Blood pressure on admission in the 220s.  She did have pulmonary edema.  Pulmonary edema has improved.  She is on nitroglycerin drip and this is been weaned off today. -She was given losartan 100 mg yesterday.  I have added Coreg 25 mg twice daily as well as nifedipine extended release 30 mg tablet. -Echocardiogram shows low normal EF 50 to 55% with no regional wall motion abnormalities -EKG with right bundle branch block -She appears euvolemic on exam today.  We will hold on further diuresis.  Her IVC is collapsible as well on her echocardiogram. -Troponin is trending up as discussed below  2.  Elevated troponin/demand ischemia versus non-STEMI -Troponin continues to rise.  She does have an A1c of 9.0.  She is not been treated for diabetes for years.  Her blood pressure also was uncontrolled.  She does have a history of tobacco abuse.  She was given aspirin yesterday.  Started on heparin drip.  She is on high intensity statin. -Overall I am concerned for non-STEMI given her considerable risk factors.  She is comfortable without any chest pain.  Her telemetry is unremarkable.  I suspect she does have underlying CAD and will need left heart catheterization.  I discussed with her extensively the need for diagnostic angiogram.  She is adamantly refusing.  I have asked her to discuss this further with her daughter and son.  I will circle back this afternoon to discuss if she would like to proceed with this. -For now we will continue with aspirin and heparin.  If she does not want heart catheterization we can just treat her medically. -continue to trend troponin   For questions or updates, please contact [facility] Please consult [email or web address] for contact info under   Time Spent with Patient: I have spent a total of 35 minutes with patient reviewing hospital notes, telemetry, EKGs, labs and examining the patient as well as establishing an  assessment and plan that was discussed with the patient.  > 50% of time was spent in direct patient care.    Signed, Lenna Gilford. Flora Lipps, MD [facility]  [facility]  01/07/2020 10:18 AM

## 2020-01-07 NOTE — Progress Notes (Signed)
ANTICOAGULATION CONSULT NOTE - Follow Up Consult  Pharmacy Consult for IV heparin Indication: chest pain/ACS  Allergies  Allergen Reactions  . Tramadol Nausea And Vomiting    Patient Measurements: Height: 5\' 6"  (167.6 cm) Weight: 157 lb 12.8 oz (71.6 kg) IBW/kg (Calculated) : 59.3 Heparin Dosing Weight: 71.8 kg  Vital Signs: Temp: 98.4 F (36.9 C) (03/29 0743) Temp Source: Oral (03/29 0743) BP: 126/78 (03/29 0743) Pulse Rate: 98 (03/29 0743)  Labs: Recent Labs    01/06/20 1132 01/06/20 1132 01/06/20 1448 01/06/20 1727 01/07/20 0610  HGB 13.2   < > 13.8  --  11.4*  HCT 43.9  --  44.8  --  37.1  PLT 248  --  238  --  200  HEPARINUNFRC  --   --   --   --  0.64  CREATININE 0.88  --  0.97  --  0.94  TROPONINIHS 462*   < > 800* 1,304* 1,532*   < > = values in this interval not displayed.    Estimated Creatinine Clearance: 53.2 mL/min (by C-G formula based on SCr of 0.94 mg/dL).   Medical History: Past Medical History:  Diagnosis Date  . Diabetes mellitus without complication (HCC)   . Hypertension     Medications:  Infusions:  . sodium chloride    . heparin 1,000 Units/hr (01/06/20 2320)  . nitroGLYCERIN 40 mcg/min (01/06/20 1854)    Assessment: 74 yo female with rising troponins, suspicion for ACS.  Pharmacy asked to start IV Heparin.  No anticoagulants noted PTA.    HL this AM is therapeutic at 0.64 on 1000 units/hr. Hgb down slightly. Plt wnl. RN states no s/s of overt bleeding noted.   Goal of Therapy:  Heparin level 0.3-0.7 units/ml Monitor platelets by anticoagulation protocol: Yes   Plan:  - Continue heparin 1000 units/hr. - Check confirmatory heparin level in 8 hrs. - Daily heparin level and CBC. - F/u cardiology plans for continued anticoagulation   65, PharmD., BCPS Clinical Pharmacist Clinical phone for 01/07/20 until 3:30pm: [phone] If after 3:30pm, please refer to [facility] for unit-specific pharmacist

## 2020-01-07 NOTE — Progress Notes (Signed)
Pt refused all new medications Dr just prescribed for her and stated that she wants to go home. Paged hospitalist, DR Dartha Lodge came to talk to pt and her son. Pt and her son decided to let her leave AMA. Pt signed AMA form.

## 2020-01-07 NOTE — Progress Notes (Addendum)
Inpatient Diabetes Program Recommendations  AACE/ADA: New Consensus Statement on Inpatient Glycemic Control (2015)  Target Ranges:  Prepandial:   less than 140 mg/dL      Peak postprandial:   less than 180 mg/dL (1-2 hours)      Critically ill patients:  140 - 180 mg/dL   Results for Bianca [name], Bianca [name] (MRN [id number]) as of 01/07/2020 10:55  Ref. Range 01/06/2020 11:15 01/06/2020 16:58 01/06/2020 22:30 01/07/2020 06:14  Glucose-Capillary Latest Ref Range: 70 - 99 mg/dL 709 (H) 628 (H)  REFUSED Novolog 162 (H) 147 (H)  REFUSED Novolog   Results for Bianca [name], Bianca [name] (MRN [id number]) as of 01/07/2020 10:55  Ref. Range 02/24/2017 08:33 01/06/2020 14:48  Hemoglobin A1C Latest Ref Range: 4.8 - 5.6 % 14.4 (H) 9.0 (H)  (211 mg.dl)    Admit with: HTN Emergency/ Elevated troponin/demand ischemia versus non-STEMI/ Pulmonary Edema  History: DM, HTN  Home DM Meds: Metformin 1000 mg BID  Current Orders: Metformin 1000 mg BID      Novolog Sensitive Correction Scale/ SSI (0-9 units) TID AC     Refusing Novolog SSI  Refusing to have Cardiac Cath    Per MD notes, pt reluctant to start Insulin at home--A1c has improved since 2018 (down to 9% from 14.4% back in 2018).  Note Dr. Teola Bradley H&P note mentions the possibility of adding SGLT-2 oral diabetes med at time of discharge--Agree.    Per Chart Review noted the following from pt's PCP notes from visit back on 08/03/2018 (Dr. Dala Dock from [facility]): "When asked if she has interest in caring for her health she said "No, I don't". Pt plans on using the power of prayer to improve her blood sugar. She mentioned "toes feeling stiff" recently, when I started to open a discussion about physical effects of elevated sugars pt stopped me from talking and said "I don't want to hear none of that negative talk!" She refuses referral to DM nutritionist."      --Will follow patient during hospitalization--  Ambrose Finland RN, MSN,  CDE Diabetes Coordinator Inpatient Glycemic Control Team Team Pager: [phone] (8a-5p)

## 2020-01-08 DIAGNOSIS — F17210 Nicotine dependence, cigarettes, uncomplicated: Secondary | ICD-10-CM | POA: Diagnosis not present

## 2020-01-08 DIAGNOSIS — Z1159 Encounter for screening for other viral diseases: Secondary | ICD-10-CM | POA: Diagnosis not present

## 2020-01-08 DIAGNOSIS — E119 Type 2 diabetes mellitus without complications: Secondary | ICD-10-CM | POA: Diagnosis not present

## 2020-01-08 DIAGNOSIS — J811 Chronic pulmonary edema: Secondary | ICD-10-CM | POA: Diagnosis not present

## 2020-01-08 DIAGNOSIS — Z09 Encounter for follow-up examination after completed treatment for conditions other than malignant neoplasm: Secondary | ICD-10-CM | POA: Diagnosis not present

## 2020-01-08 DIAGNOSIS — I509 Heart failure, unspecified: Secondary | ICD-10-CM | POA: Diagnosis not present

## 2020-01-08 DIAGNOSIS — I161 Hypertensive emergency: Secondary | ICD-10-CM | POA: Diagnosis not present

## 2020-01-08 NOTE — Discharge Summary (Signed)
Patient signed on leave the hospital AGAINST MEDICAL ADVICE.  Patient is a 74 year old female with past medical history significant for noncompliance, diabetes mellitus and hypertension.  Patient may have underlying paranoid tendencies that may not have been addressed.  Patient does not trust the medical community.  Patient is not convinced of any medical problems.  Patient was admitted with hypertensive emergency and pulmonary edema.  Cardiology team assisted in directing patient's care.  Patient and patient's son insisted on patient leaving the hospital AGAINST MEDICAL ADVICE, and did not want any discussion with the medical team.  Hopefully, patient will follow with a primary care provider.

## 2020-01-09 DIAGNOSIS — R0602 Shortness of breath: Secondary | ICD-10-CM | POA: Diagnosis not present

## 2020-01-09 DIAGNOSIS — R931 Abnormal findings on diagnostic imaging of heart and coronary circulation: Secondary | ICD-10-CM | POA: Diagnosis not present

## 2020-01-09 DIAGNOSIS — I509 Heart failure, unspecified: Secondary | ICD-10-CM | POA: Diagnosis not present

## 2020-01-15 DIAGNOSIS — F17210 Nicotine dependence, cigarettes, uncomplicated: Secondary | ICD-10-CM | POA: Diagnosis not present

## 2020-01-15 DIAGNOSIS — R079 Chest pain, unspecified: Secondary | ICD-10-CM | POA: Diagnosis not present

## 2020-01-15 DIAGNOSIS — E119 Type 2 diabetes mellitus without complications: Secondary | ICD-10-CM | POA: Diagnosis not present

## 2020-01-15 DIAGNOSIS — Z87891 Personal history of nicotine dependence: Secondary | ICD-10-CM | POA: Diagnosis not present

## 2020-01-16 DIAGNOSIS — Z23 Encounter for immunization: Secondary | ICD-10-CM | POA: Diagnosis not present

## 2020-01-16 DIAGNOSIS — Z122 Encounter for screening for malignant neoplasm of respiratory organs: Secondary | ICD-10-CM | POA: Diagnosis not present

## 2020-01-18 DIAGNOSIS — I10 Essential (primary) hypertension: Secondary | ICD-10-CM | POA: Diagnosis not present

## 2020-01-18 DIAGNOSIS — M549 Dorsalgia, unspecified: Secondary | ICD-10-CM | POA: Diagnosis not present

## 2020-01-18 DIAGNOSIS — E119 Type 2 diabetes mellitus without complications: Secondary | ICD-10-CM | POA: Diagnosis not present

## 2020-01-23 ENCOUNTER — Telehealth: Payer: Self-pay

## 2020-01-23 DIAGNOSIS — I10 Essential (primary) hypertension: Secondary | ICD-10-CM | POA: Diagnosis not present

## 2020-01-23 DIAGNOSIS — I2510 Atherosclerotic heart disease of native coronary artery without angina pectoris: Secondary | ICD-10-CM | POA: Diagnosis not present

## 2020-01-23 NOTE — Telephone Encounter (Signed)
Spoke to patient's daughter.I received a message from Dr.Berry to schedule your mother appointment with Dr.Berry next Tuesday 4/20.Appointment scheduled 4/20 at 11:30 am.Advised to bring all medications.

## 2020-01-29 ENCOUNTER — Encounter: Payer: Self-pay | Admitting: Cardiovascular Disease

## 2020-01-29 ENCOUNTER — Other Ambulatory Visit: Payer: Self-pay

## 2020-01-29 ENCOUNTER — Ambulatory Visit (INDEPENDENT_AMBULATORY_CARE_PROVIDER_SITE_OTHER): Payer: Medicare Other | Admitting: Cardiovascular Disease

## 2020-01-29 ENCOUNTER — Other Ambulatory Visit (HOSPITAL_COMMUNITY)
Admission: RE | Admit: 2020-01-29 | Discharge: 2020-01-29 | Disposition: A | Discharge status: Home / Self Care | Payer: Medicare Other | Source: Ambulatory Visit | Attending: Cardiovascular Disease | Admitting: Cardiovascular Disease

## 2020-01-29 DIAGNOSIS — Z01812 Encounter for preprocedural laboratory examination: Secondary | ICD-10-CM | POA: Diagnosis not present

## 2020-01-29 DIAGNOSIS — I214 Non-ST elevation (NSTEMI) myocardial infarction: Secondary | ICD-10-CM | POA: Diagnosis not present

## 2020-01-29 DIAGNOSIS — Z20822 Contact with and (suspected) exposure to covid-19: Secondary | ICD-10-CM | POA: Diagnosis not present

## 2020-01-29 DIAGNOSIS — I10 Essential (primary) hypertension: Secondary | ICD-10-CM

## 2020-01-29 DIAGNOSIS — R079 Chest pain, unspecified: Secondary | ICD-10-CM

## 2020-01-29 DIAGNOSIS — I5031 Acute diastolic (congestive) heart failure: Secondary | ICD-10-CM | POA: Diagnosis not present

## 2020-01-29 DIAGNOSIS — E782 Mixed hyperlipidemia: Secondary | ICD-10-CM

## 2020-01-29 DIAGNOSIS — E785 Hyperlipidemia, unspecified: Secondary | ICD-10-CM | POA: Insufficient documentation

## 2020-01-29 LAB — SARS CORONAVIRUS 2 (TAT 6-24 HRS): SARS Coronavirus 2: NEGATIVE

## 2020-01-29 NOTE — H&P (View-Only) (Signed)
01/29/2020 Bianca [name]   1946/04/23  [id number]  Primary Physician Julian Hy, PA-C Primary Cardiologist: Lorretta Harp MD Bianca [name], [place]  HPI:  Bianca [name] is a 74 y.o. thin appearing divorced/widowed African-American female mother of 2 children, grandmother of 1 grandchild who is accompanied by her daughter Bianca [name].  She is referred by Dr. Mardene Speak, cardiologist, for diagnostic coronary angiography.  She has a history of greater than 100 pack years of tobacco abuse having quit recently during hospitalization.  She has treated hypertension, diabetes and hyperlipidemia as well as family history with 2 brothers who have had stents.  She was admitted to [facility] last month with hypertensive urgency and pulmonary edema.  She was placed on a nitro drip and diuresed.  2D echo revealed no low normal LV function with normal valvular function.  She did leave AMA.  She has been having atypical left lateral chest and back pain.  Her echo was reviewed by her cardiologist who thought that she had subtle inferior hypokinesia and given her symptoms and risk factors recommended that she undergo diagnostic coronary angiography.   Current Meds  Medication Sig  . amLODipine (NORVASC) 5 MG tablet Take 5 mg by mouth daily.  . Blood Pressure Monitoring (ADULT BLOOD PRESSURE CUFF LG) KIT Use to check blood pressure twice a day, keep a running log of your blood pressures  . carvedilol (COREG) 12.5 MG tablet Take 12.5 mg by mouth 2 (two) times daily with a meal.  . furosemide (LASIX) 20 MG tablet Take 20 mg by mouth.  Marland Kitchen glucose blood (CONTOUR NEXT TEST) test strip Use as instructed  . glucose monitoring kit (FREESTYLE) monitoring kit 1 each by Does not apply route 2 (two) times daily.  . isosorbide dinitrate (ISORDIL) 20 MG tablet Take 20 mg by mouth 2 (two) times daily.  Marland Kitchen losartan (COZAAR) 100 MG tablet Take 100 mg by mouth daily.   . metFORMIN (GLUCOPHAGE) 1000 MG  tablet TAKE 1 TABLET(1000 MG) BY MOUTH TWICE DAILY (Patient taking differently: Take 1,000 mg by mouth 2 (two) times daily with a meal. )  . MICROLET LANCETS MISC USE TO TEST BLOOD SUGAR ONCE DAILY     Allergies  Allergen Reactions  . Tramadol Nausea And Vomiting    Social History   Socioeconomic History  . Marital status: Divorced    Spouse name: Not on file  . Number of children: Not on file  . Years of education: Not on file  . Highest education level: Not on file  Occupational History  . Not on file  Tobacco Use  . Smoking status: Current Every Day Smoker    Packs/day: 0.50    Types: Cigarettes  . Smokeless tobacco: Never Used  Substance and Sexual Activity  . Alcohol use: No  . Drug use: No  . Sexual activity: Not on file  Other Topics Concern  . Not on file  Social History Narrative  . Not on file   Social Determinants of Health   Financial Resource Strain:   . Difficulty of Paying Living Expenses:   Food Insecurity:   . Worried About Charity fundraiser in the Last Year:   . Arboriculturist in the Last Year:   Transportation Needs:   . Film/video editor (Medical):   Marland Kitchen Lack of Transportation (Non-Medical):   Physical Activity:   . Days of Exercise per Week:   . Minutes of Exercise per Session:  Stress:   . Feeling of Stress :   Social Connections:   . Frequency of Communication with Friends and Family:   . Frequency of Social Gatherings with Friends and Family:   . Attends Religious Services:   . Active Member of Clubs or Organizations:   . Attends Archivist Meetings:   Marland Kitchen Marital Status:   Intimate Partner Violence:   . Fear of Current or Ex-Partner:   . Emotionally Abused:   Marland Kitchen Physically Abused:   . Sexually Abused:      Review of Systems: General: negative for chills, fever, night sweats or weight changes.  Cardiovascular: negative for chest pain, dyspnea on exertion, edema, orthopnea, palpitations, paroxysmal nocturnal dyspnea  or shortness of breath Dermatological: negative for rash Respiratory: negative for cough or wheezing Urologic: negative for hematuria Abdominal: negative for nausea, vomiting, diarrhea, bright red blood per rectum, melena, or hematemesis Neurologic: negative for visual changes, syncope, or dizziness All other systems reviewed and are otherwise negative except as noted above.    Blood pressure (!) 192/98, pulse 77, height _0  (1.676 m), weight 158 lb 6.4 oz (71.8 kg), SpO2 98 %.  General appearance: alert and no distress Neck: no adenopathy, no JVD, supple, symmetrical, trachea midline, thyroid not enlarged, symmetric, no tenderness/mass/nodules and Soft bilateral carotid bruits Lungs: clear to auscultation bilaterally Heart: regular rate and rhythm, S1, S2 normal, no murmur, click, rub or gallop Extremities: extremities normal, atraumatic, no cyanosis or edema Pulses: 2+ and symmetric Skin: Skin color, texture, turgor normal. No rashes or lesions Neurologic: Alert and oriented X 3, normal strength and tone. Normal symmetric reflexes. Normal coordination and gait  EKG not performed today  ASSESSMENT AND PLAN:   TOBACCO ABUSE Long history tobacco abuse having recently discontinued at the time of her recent hospitalization  Acute diastolic CHF (congestive heart failure) ([facility]) Recent admission for diastolic heart failure with hypertensive urgency, positive troponins of 1500 and elevated BNP the 2D echo that showed normal LV systolic function, mild LVH and grade 1 diastolic dysfunction.  She currently is on a diuretic.  Non-STEMI (non-ST elevated myocardial infarction) (HCC) Troponins of 1500 during her recent hospitalization for hypertensive urgency and acute pulmonary edema.  It was thought this may have been related to demand ischemia although myocardial ischemia from CAD could not be ruled out.  Based on this, her symptoms and subtle wall motion normality as described by her referring  cardiologist she will undergo diagnostic coronary angiography. The patient understands that risks included but are not limited to stroke (1 in 1000), death (1 in 32), kidney failure [usually temporary] (1 in 500), bleeding (1 in 200), allergic reaction [possibly serious] (1 in 200). The patient understands and agrees to proceed  Essential hypertension History of essential hypertension with blood pressure measured in the office today of 192/98 although her blood pressure at home which she measured was 167/81.  She is on amlodipine, carvedilol and losartan.  She probably will require renal Doppler study to rule out renal vascular hypertension.  Hyperlipidemia Patient hyperlipidemia currently not on statin therapy with lipid profile performed 12/29/2019 revealing a total cholesterol 230, LDL 10[birth year]11[birth year]1950 and HDL 57.  She is followed by a cardiologist but I think would benefit from being on statin therapy given her risk factor profile.      Lorretta Harp MD FACP,FACC,FAHA, [facility] 01/29/2020 12:16 PM

## 2020-01-29 NOTE — Assessment & Plan Note (Signed)
Long history tobacco abuse having recently discontinued at the time of her recent hospitalization

## 2020-01-29 NOTE — Assessment & Plan Note (Signed)
Troponins of 1500 during her recent hospitalization for hypertensive urgency and acute pulmonary edema.  It was thought this may have been related to demand ischemia although myocardial ischemia from CAD could not be ruled out.  Based on this, her symptoms and subtle wall motion normality as described by her referring cardiologist she will undergo diagnostic coronary angiography. The patient understands that risks included but are not limited to stroke (1 in 1000), death (1 in 1000), kidney failure [usually temporary] (1 in 500), bleeding (1 in 200), allergic reaction [possibly serious] (1 in 200). The patient understands and agrees to proceed

## 2020-01-29 NOTE — Assessment & Plan Note (Signed)
Patient hyperlipidemia currently not on statin therapy with lipid profile performed 12/29/2019 revealing a total cholesterol 230, LDL 10/11/1950 and HDL 57.  She is followed by a cardiologist but I think would benefit from being on statin therapy given her risk factor profile.

## 2020-01-29 NOTE — Assessment & Plan Note (Signed)
History of essential hypertension with blood pressure measured in the office today of 192/98 although her blood pressure at home which she measured was 167/81.  She is on amlodipine, carvedilol and losartan.  She probably will require renal Doppler study to rule out renal vascular hypertension.

## 2020-01-29 NOTE — Progress Notes (Signed)
01/29/2020 Bianca [name]   1946/04/23  [id number]  Primary Physician Julian Hy, PA-C Primary Cardiologist: Lorretta Harp MD Bianca [name], [place]  HPI:  Bianca [name] is a 74 y.o. thin appearing divorced/widowed African-American female mother of 2 children, grandmother of 1 grandchild who is accompanied by her daughter Bianca [name].  She is referred by Dr. Mardene Speak, cardiologist, for diagnostic coronary angiography.  She has a history of greater than 100 pack years of tobacco abuse having quit recently during hospitalization.  She has treated hypertension, diabetes and hyperlipidemia as well as family history with 2 brothers who have had stents.  She was admitted to [facility] last month with hypertensive urgency and pulmonary edema.  She was placed on a nitro drip and diuresed.  2D echo revealed no low normal LV function with normal valvular function.  She did leave AMA.  She has been having atypical left lateral chest and back pain.  Her echo was reviewed by her cardiologist who thought that she had subtle inferior hypokinesia and given her symptoms and risk factors recommended that she undergo diagnostic coronary angiography.   Current Meds  Medication Sig  . amLODipine (NORVASC) 5 MG tablet Take 5 mg by mouth daily.  . Blood Pressure Monitoring (ADULT BLOOD PRESSURE CUFF LG) KIT Use to check blood pressure twice a day, keep a running log of your blood pressures  . carvedilol (COREG) 12.5 MG tablet Take 12.5 mg by mouth 2 (two) times daily with a meal.  . furosemide (LASIX) 20 MG tablet Take 20 mg by mouth.  Marland Kitchen glucose blood (CONTOUR NEXT TEST) test strip Use as instructed  . glucose monitoring kit (FREESTYLE) monitoring kit 1 each by Does not apply route 2 (two) times daily.  . isosorbide dinitrate (ISORDIL) 20 MG tablet Take 20 mg by mouth 2 (two) times daily.  Marland Kitchen losartan (COZAAR) 100 MG tablet Take 100 mg by mouth daily.   . metFORMIN (GLUCOPHAGE) 1000 MG  tablet TAKE 1 TABLET(1000 MG) BY MOUTH TWICE DAILY (Patient taking differently: Take 1,000 mg by mouth 2 (two) times daily with a meal. )  . MICROLET LANCETS MISC USE TO TEST BLOOD SUGAR ONCE DAILY     Allergies  Allergen Reactions  . Tramadol Nausea And Vomiting    Social History   Socioeconomic History  . Marital status: Divorced    Spouse name: Not on file  . Number of children: Not on file  . Years of education: Not on file  . Highest education level: Not on file  Occupational History  . Not on file  Tobacco Use  . Smoking status: Current Every Day Smoker    Packs/day: 0.50    Types: Cigarettes  . Smokeless tobacco: Never Used  Substance and Sexual Activity  . Alcohol use: No  . Drug use: No  . Sexual activity: Not on file  Other Topics Concern  . Not on file  Social History Narrative  . Not on file   Social Determinants of Health   Financial Resource Strain:   . Difficulty of Paying Living Expenses:   Food Insecurity:   . Worried About Charity fundraiser in the Last Year:   . Arboriculturist in the Last Year:   Transportation Needs:   . Film/video editor (Medical):   Marland Kitchen Lack of Transportation (Non-Medical):   Physical Activity:   . Days of Exercise per Week:   . Minutes of Exercise per Session:  Stress:   . Feeling of Stress :   Social Connections:   . Frequency of Communication with Friends and Family:   . Frequency of Social Gatherings with Friends and Family:   . Attends Religious Services:   . Active Member of Clubs or Organizations:   . Attends Archivist Meetings:   Marland Kitchen Marital Status:   Intimate Partner Violence:   . Fear of Current or Ex-Partner:   . Emotionally Abused:   Marland Kitchen Physically Abused:   . Sexually Abused:      Review of Systems: General: negative for chills, fever, night sweats or weight changes.  Cardiovascular: negative for chest pain, dyspnea on exertion, edema, orthopnea, palpitations, paroxysmal nocturnal dyspnea  or shortness of breath Dermatological: negative for rash Respiratory: negative for cough or wheezing Urologic: negative for hematuria Abdominal: negative for nausea, vomiting, diarrhea, bright red blood per rectum, melena, or hematemesis Neurologic: negative for visual changes, syncope, or dizziness All other systems reviewed and are otherwise negative except as noted above.    Blood pressure (!) 192/98, pulse 77, height _0  (1.676 m), weight 158 lb 6.4 oz (71.8 kg), SpO2 98 %.  General appearance: alert and no distress Neck: no adenopathy, no JVD, supple, symmetrical, trachea midline, thyroid not enlarged, symmetric, no tenderness/mass/nodules and Soft bilateral carotid bruits Lungs: clear to auscultation bilaterally Heart: regular rate and rhythm, S1, S2 normal, no murmur, click, rub or gallop Extremities: extremities normal, atraumatic, no cyanosis or edema Pulses: 2+ and symmetric Skin: Skin color, texture, turgor normal. No rashes or lesions Neurologic: Alert and oriented X 3, normal strength and tone. Normal symmetric reflexes. Normal coordination and gait  EKG not performed today  ASSESSMENT AND PLAN:   TOBACCO ABUSE Long history tobacco abuse having recently discontinued at the time of her recent hospitalization  Acute diastolic CHF (congestive heart failure) ([facility]) Recent admission for diastolic heart failure with hypertensive urgency, positive troponins of 1500 and elevated BNP the 2D echo that showed normal LV systolic function, mild LVH and grade 1 diastolic dysfunction.  She currently is on a diuretic.  Non-STEMI (non-ST elevated myocardial infarction) (HCC) Troponins of 1500 during her recent hospitalization for hypertensive urgency and acute pulmonary edema.  It was thought this may have been related to demand ischemia although myocardial ischemia from CAD could not be ruled out.  Based on this, her symptoms and subtle wall motion normality as described by her referring  cardiologist she will undergo diagnostic coronary angiography. The patient understands that risks included but are not limited to stroke (1 in 1000), death (1 in 32), kidney failure [usually temporary] (1 in 500), bleeding (1 in 200), allergic reaction [possibly serious] (1 in 200). The patient understands and agrees to proceed  Essential hypertension History of essential hypertension with blood pressure measured in the office today of 192/98 although her blood pressure at home which she measured was 167/81.  She is on amlodipine, carvedilol and losartan.  She probably will require renal Doppler study to rule out renal vascular hypertension.  Hyperlipidemia Patient hyperlipidemia currently not on statin therapy with lipid profile performed 12/29/2019 revealing a total cholesterol 230, LDL 10/11/1950 and HDL 57.  She is followed by a cardiologist but I think would benefit from being on statin therapy given her risk factor profile.      Lorretta Harp MD FACP,FACC,FAHA, [facility] 01/29/2020 12:16 PM

## 2020-01-29 NOTE — Assessment & Plan Note (Signed)
Recent admission for diastolic heart failure with hypertensive urgency, positive troponins of 1500 and elevated BNP the 2D echo that showed normal LV systolic function, mild LVH and grade 1 diastolic dysfunction.  She currently is on a diuretic.

## 2020-01-29 NOTE — Patient Instructions (Signed)
Medication Instructions:  Your physician recommends that you continue on your current medications as directed. Please refer to the Current Medication list given to you today.  *If you need a refill on your cardiac medications before your next appointment, please call your pharmacy*  Lab Work: BMET, CBC today  COVID TEST: Today after lab work [address] [employer]  If you have labs (blood work) drawn today and your tests are completely normal, you will receive your results only by: Marland Kitchen MyChart Message (if you have MyChart) OR . A paper copy in the mail If you have any lab test that is abnormal or we need to change your treatment, we will call you to review the results.   Testing/Procedures: Your physician has requested that you have a cardiac catheterization. Cardiac catheterization is used to diagnose and/or treat various heart conditions. Doctors may recommend this procedure for a number of different reasons. The most common reason is to evaluate chest pain. Chest pain can be a symptom of coronary artery disease (CAD), and cardiac catheterization can show whether plaque is narrowing or blocking your heart's arteries. This procedure is also used to evaluate the valves, as well as measure the blood flow and oxygen levels in different parts of your heart. For further information please visit [email or web address] Please follow instruction sheet, as given.  Follow-Up: At [facility], you and your health needs are our priority.  As part of our continuing mission to provide you with exceptional heart care, we have created designated Provider Care Teams.  These Care Teams include your primary Cardiologist (physician) and Advanced Practice Providers (APPs -  Physician Assistants and Nurse Practitioners) who all work together to provide you with the care you need, when you need it.  We recommend signing up for the patient portal called "MyChart".  Sign up information is provided on this After Visit  Summary.  MyChart is used to connect with patients for Virtual Visits (Telemedicine).  Patients are able to view lab/test results, encounter notes, upcoming appointments, etc.  Non-urgent messages can be sent to your provider as well.   To learn more about what you can do with MyChart, go to [email or web address].    Your next appointment:   2 week(s)  The format for your next appointment:   In Person  Provider:   You may see Dr. Allyson Sabal or one of the following Advanced Practice Providers on your designated Care Team:    Corine Shelter, PA-C  [place], [place]  Edd Fabian, [place]

## 2020-01-30 ENCOUNTER — Encounter: Payer: Self-pay | Admitting: Podiatry

## 2020-01-30 ENCOUNTER — Ambulatory Visit (INDEPENDENT_AMBULATORY_CARE_PROVIDER_SITE_OTHER): Payer: Medicare Other | Admitting: Podiatry

## 2020-01-30 DIAGNOSIS — E1169 Type 2 diabetes mellitus with other specified complication: Secondary | ICD-10-CM

## 2020-01-30 DIAGNOSIS — M79675 Pain in left toe(s): Secondary | ICD-10-CM | POA: Diagnosis not present

## 2020-01-30 DIAGNOSIS — M79674 Pain in right toe(s): Secondary | ICD-10-CM

## 2020-01-30 DIAGNOSIS — B351 Tinea unguium: Secondary | ICD-10-CM | POA: Diagnosis not present

## 2020-01-30 LAB — CBC
Hematocrit: 39.8 % (ref 34.0–46.6)
Hemoglobin: 12.7 g/dL (ref 11.1–15.9)
MCH: 27.1 pg (ref 26.6–33.0)
MCHC: 31.9 g/dL (ref 31.5–35.7)
MCV: 85 fL (ref 79–97)
Platelets: 209 10*3/uL (ref 150–450)
RBC: 4.68 x10E6/uL (ref 3.77–5.28)
RDW: 13.6 % (ref 11.7–15.4)
WBC: 6.1 10*3/uL (ref 3.4–10.8)

## 2020-01-30 LAB — BASIC METABOLIC PANEL
BUN/Creatinine Ratio: 17 (ref 12–28)
BUN: 14 mg/dL (ref 8–27)
CO2: 24 mmol/L (ref 20–29)
Calcium: 9.1 mg/dL (ref 8.7–10.3)
Chloride: 105 mmol/L (ref 96–106)
Creatinine, Ser: 0.82 mg/dL (ref 0.57–1.00)
GFR calc Af Amer: 82 mL/min/{1.73_m2} (ref >59)
GFR calc non Af Amer: 71 mL/min/{1.73_m2} (ref >59)
Glucose: 128 mg/dL — ABNORMAL HIGH (ref 65–99)
Potassium: 4.2 mmol/L (ref 3.5–5.2)
Sodium: 144 mmol/L (ref 134–144)

## 2020-01-30 NOTE — Progress Notes (Signed)
This patient returns to my office for at risk foot care.  This patient requires this care by a professional since this patient will be at risk due to having diabetes type 2.  This patient is unable to cut nails herself since the patient cannot reach her nails.These nails are painful walking and wearing shoes.  This patient presents for at risk foot care today. Patient has not been seen in oever 2 years.  General Appearance  Alert, conversant and in no acute stress.  Vascular  Dorsalis pedis and posterior tibial  pulses are palpable  bilaterally.  Capillary return is within normal limits  bilaterally. Temperature is within normal limits  bilaterally.  Neurologic  Senn-Weinstein monofilament wire test within normal limits  bilaterally. Muscle power within normal limits bilaterally.  Nails Thick disfigured discolored nails with subungual debris  from hallux to fifth toes bilaterally. No evidence of bacterial infection or drainage bilaterally.  Orthopedic  No limitations of motion  feet .  No crepitus or effusions noted.  No bony pathology or digital deformities noted.  Skin  normotropic skin with no porokeratosis noted bilaterally.  No signs of infections or ulcers noted.     Onychomycosis  Pain in right toes  Pain in left toes  Consent was obtained for treatment procedures.   Mechanical debridement of nails 1-5  bilaterally performed with a nail nipper.  Filed with dremel without incident.    Return office visit     3 months                Told patient to return for periodic foot care and evaluation due to potential at risk complications.   Helane Gunther DPM

## 2020-01-31 ENCOUNTER — Observation Stay (HOSPITAL_BASED_OUTPATIENT_CLINIC_OR_DEPARTMENT_OTHER)
Admission: RE | Admit: 2020-01-31 | Discharge: 2020-02-01 | Disposition: A | Discharge status: Home / Self Care | Payer: Medicare Other | Source: Home / Self Care | Attending: Cardiovascular Disease | Admitting: Cardiovascular Disease

## 2020-01-31 ENCOUNTER — Encounter (HOSPITAL_COMMUNITY)
Admission: RE | Disposition: A | Discharge status: Home / Self Care | Payer: Self-pay | Source: Home / Self Care | Attending: Cardiovascular Disease

## 2020-01-31 ENCOUNTER — Other Ambulatory Visit: Payer: Self-pay

## 2020-01-31 DIAGNOSIS — I2510 Atherosclerotic heart disease of native coronary artery without angina pectoris: Secondary | ICD-10-CM | POA: Diagnosis present

## 2020-01-31 DIAGNOSIS — I214 Non-ST elevation (NSTEMI) myocardial infarction: Secondary | ICD-10-CM | POA: Diagnosis not present

## 2020-01-31 DIAGNOSIS — R0600 Dyspnea, unspecified: Secondary | ICD-10-CM | POA: Diagnosis not present

## 2020-01-31 DIAGNOSIS — I5033 Acute on chronic diastolic (congestive) heart failure: Secondary | ICD-10-CM | POA: Insufficient documentation

## 2020-01-31 DIAGNOSIS — I110 Hypertensive heart disease with heart failure: Secondary | ICD-10-CM | POA: Insufficient documentation

## 2020-01-31 DIAGNOSIS — Z79899 Other long term (current) drug therapy: Secondary | ICD-10-CM | POA: Insufficient documentation

## 2020-01-31 DIAGNOSIS — F17210 Nicotine dependence, cigarettes, uncomplicated: Secondary | ICD-10-CM | POA: Insufficient documentation

## 2020-01-31 DIAGNOSIS — Z7984 Long term (current) use of oral hypoglycemic drugs: Secondary | ICD-10-CM | POA: Insufficient documentation

## 2020-01-31 DIAGNOSIS — E785 Hyperlipidemia, unspecified: Secondary | ICD-10-CM | POA: Insufficient documentation

## 2020-01-31 DIAGNOSIS — E119 Type 2 diabetes mellitus without complications: Secondary | ICD-10-CM | POA: Insufficient documentation

## 2020-01-31 DIAGNOSIS — I252 Old myocardial infarction: Secondary | ICD-10-CM | POA: Insufficient documentation

## 2020-01-31 HISTORY — PX: LEFT HEART CATH AND CORONARY ANGIOGRAPHY: CATH118249

## 2020-01-31 HISTORY — DX: Atherosclerotic heart disease of native coronary artery without angina pectoris: I25.10

## 2020-01-31 LAB — GLUCOSE, CAPILLARY
Glucose-Capillary: 124 mg/dL — ABNORMAL HIGH (ref 70–99)
Glucose-Capillary: 105 mg/dL — ABNORMAL HIGH (ref 70–99)
Glucose-Capillary: 93 mg/dL (ref 70–99)
Glucose-Capillary: 163 mg/dL — ABNORMAL HIGH (ref 70–99)

## 2020-01-31 SURGERY — LEFT HEART CATH AND CORONARY ANGIOGRAPHY
Anesthesia: LOCAL

## 2020-01-31 MED ORDER — ISOSORBIDE DINITRATE 10 MG PO TABS
20.0000 mg | ORAL_TABLET | Freq: Two times a day (BID) | ORAL | Status: DC
  Administered 2020-01-31: 20:00:00 20 mg via ORAL
  Filled 2020-01-31: qty 2

## 2020-01-31 MED ORDER — MIDAZOLAM HCL 2 MG/2ML IJ SOLN
INTRAMUSCULAR | Status: AC
  Filled 2020-01-31: qty 2

## 2020-01-31 MED ORDER — FENTANYL CITRATE (PF) 100 MCG/2ML IJ SOLN
INTRAMUSCULAR | Status: DC | PRN
  Administered 2020-01-31: 25 ug via INTRAVENOUS

## 2020-01-31 MED ORDER — AMLODIPINE BESYLATE 5 MG PO TABS
5.0000 mg | ORAL_TABLET | Freq: Every day | ORAL | Status: DC
  Administered 2020-02-01: 10:00:00 5 mg via ORAL
  Filled 2020-01-31 (×2): qty 1

## 2020-01-31 MED ORDER — CARVEDILOL 12.5 MG PO TABS
12.5000 mg | ORAL_TABLET | Freq: Two times a day (BID) | ORAL | Status: DC
  Administered 2020-01-31: 20:00:00 12.5 mg via ORAL
  Filled 2020-01-31: qty 1

## 2020-01-31 MED ORDER — SODIUM CHLORIDE 0.9 % IV SOLN: 250.0000 mL | INTRAVENOUS | Status: DC | PRN

## 2020-01-31 MED ORDER — LIDOCAINE HCL (PF) 1 % IJ SOLN
INTRAMUSCULAR | Status: DC | PRN
  Administered 2020-01-31: 4 mL

## 2020-01-31 MED ORDER — ACETAMINOPHEN 325 MG PO TABS: 650.0000 mg | ORAL_TABLET | ORAL | Status: DC | PRN

## 2020-01-31 MED ORDER — LABETALOL HCL 5 MG/ML IV SOLN: 10.0000 mg | INTRAVENOUS | Status: AC | PRN

## 2020-01-31 MED ORDER — MIDAZOLAM HCL 2 MG/2ML IJ SOLN
INTRAMUSCULAR | Status: DC | PRN
  Administered 2020-01-31: 1 mg via INTRAVENOUS

## 2020-01-31 MED ORDER — HEPARIN (PORCINE) IN NACL 1000-0.9 UT/500ML-% IV SOLN
INTRAVENOUS | Status: AC
  Filled 2020-01-31: qty 500

## 2020-01-31 MED ORDER — HEPARIN (PORCINE) IN NACL 1000-0.9 UT/500ML-% IV SOLN
INTRAVENOUS | Status: DC | PRN
  Administered 2020-01-31: 500 mL

## 2020-01-31 MED ORDER — HYDRALAZINE HCL 20 MG/ML IJ SOLN: 10.0000 mg | INTRAMUSCULAR | Status: AC | PRN

## 2020-01-31 MED ORDER — SODIUM CHLORIDE 0.9% FLUSH
3.0000 mL | Freq: Two times a day (BID) | INTRAVENOUS | Status: DC
  Administered 2020-01-31 – 2020-02-01 (×2): 3 mL via INTRAVENOUS

## 2020-01-31 MED ORDER — SODIUM CHLORIDE 0.9% FLUSH: 3.0000 mL | INTRAVENOUS | Status: DC | PRN

## 2020-01-31 MED ORDER — IOHEXOL 350 MG/ML SOLN
INTRAVENOUS | Status: DC | PRN
  Administered 2020-01-31: 100 mL

## 2020-01-31 MED ORDER — GLUCOSE BLOOD VI STRP: 1.0000 | ORAL_STRIP | Status: DC | PRN

## 2020-01-31 MED ORDER — LIDOCAINE HCL (PF) 1 % IJ SOLN
INTRAMUSCULAR | Status: AC
  Filled 2020-01-31: qty 30

## 2020-01-31 MED ORDER — FUROSEMIDE 20 MG PO TABS: 20.0000 mg | ORAL_TABLET | Freq: Every day | ORAL | Status: DC

## 2020-01-31 MED ORDER — HEPARIN SODIUM (PORCINE) 1000 UNIT/ML IJ SOLN
INTRAMUSCULAR | Status: DC | PRN
  Administered 2020-01-31: 3500 [IU] via INTRAVENOUS

## 2020-01-31 MED ORDER — NITROGLYCERIN 1 MG/10 ML FOR IR/CATH LAB
INTRA_ARTERIAL | Status: AC
  Filled 2020-01-31: qty 10

## 2020-01-31 MED ORDER — VERAPAMIL HCL 2.5 MG/ML IV SOLN
INTRA_ARTERIAL | Status: DC | PRN
  Administered 2020-01-31: 7 mL via INTRA_ARTERIAL

## 2020-01-31 MED ORDER — SODIUM CHLORIDE 0.9 % WEIGHT BASED INFUSION
3.0000 mL/kg/h | INTRAVENOUS | Status: DC
  Administered 2020-01-31: 12:00:00 3 mL/kg/h via INTRAVENOUS

## 2020-01-31 MED ORDER — FENTANYL CITRATE (PF) 100 MCG/2ML IJ SOLN
INTRAMUSCULAR | Status: AC
  Filled 2020-01-31: qty 2

## 2020-01-31 MED ORDER — ASPIRIN EC 81 MG PO TBEC
81.0000 mg | DELAYED_RELEASE_TABLET | Freq: Every day | ORAL | Status: DC
  Administered 2020-02-01: 10:00:00 81 mg via ORAL
  Filled 2020-01-31: qty 1

## 2020-01-31 MED ORDER — LOSARTAN POTASSIUM 50 MG PO TABS
100.0000 mg | ORAL_TABLET | Freq: Every day | ORAL | Status: DC
  Administered 2020-02-01: 10:00:00 100 mg via ORAL
  Filled 2020-01-31: qty 2

## 2020-01-31 MED ORDER — SODIUM CHLORIDE 0.9 % IV SOLN: INTRAVENOUS | Status: AC

## 2020-01-31 MED ORDER — ATORVASTATIN CALCIUM 80 MG PO TABS
80.0000 mg | ORAL_TABLET | Freq: Every day | ORAL | Status: DC
  Administered 2020-01-31 – 2020-02-01 (×2): 80 mg via ORAL
  Filled 2020-01-31 (×2): qty 1

## 2020-01-31 MED ORDER — SODIUM CHLORIDE 0.9% FLUSH: 3.0000 mL | Freq: Two times a day (BID) | INTRAVENOUS | Status: DC

## 2020-01-31 MED ORDER — HEPARIN SODIUM (PORCINE) 1000 UNIT/ML IJ SOLN
INTRAMUSCULAR | Status: AC
  Filled 2020-01-31: qty 1

## 2020-01-31 MED ORDER — VERAPAMIL HCL 2.5 MG/ML IV SOLN
INTRAVENOUS | Status: AC
  Filled 2020-01-31: qty 2

## 2020-01-31 MED ORDER — ASPIRIN 81 MG PO CHEW: 81.0000 mg | CHEWABLE_TABLET | ORAL | Status: DC

## 2020-01-31 MED ORDER — ONDANSETRON HCL 4 MG/2ML IJ SOLN: 4.0000 mg | Freq: Four times a day (QID) | INTRAMUSCULAR | Status: DC | PRN

## 2020-01-31 MED ORDER — SODIUM CHLORIDE 0.9 % WEIGHT BASED INFUSION: 1.0000 mL/kg/h | INTRAVENOUS | Status: DC

## 2020-01-31 MED ORDER — IOHEXOL 350 MG/ML SOLN
INTRAVENOUS | Status: AC
  Filled 2020-01-31: qty 1

## 2020-01-31 SURGICAL SUPPLY — 11 items
CATH INFINITI 5 FR JL3.5 (CATHETERS) ×1 IMPLANT
CATH OPTITORQUE TIG 4.0 5F (CATHETERS) ×1 IMPLANT
DEVICE RAD COMP TR BAND LRG (VASCULAR PRODUCTS) ×1 IMPLANT
GLIDESHEATH SLEND A-KIT 6F 22G (SHEATH) ×1 IMPLANT
GUIDEWIRE INQWIRE 1.5J.035X260 (WIRE) IMPLANT
INQWIRE 1.5J .035X260CM (WIRE) ×2
KIT HEART LEFT (KITS) ×2 IMPLANT
PACK CARDIAC CATHETERIZATION (CUSTOM PROCEDURE TRAY) ×2 IMPLANT
TRANSDUCER W/STOPCOCK (MISCELLANEOUS) ×2 IMPLANT
TUBING CIL FLEX 10 FLL-RA (TUBING) ×2 IMPLANT
WIRE HI TORQ VERSACORE-J 145CM (WIRE) ×1 IMPLANT

## 2020-01-31 NOTE — Consult Note (Signed)
[address]       [place] [address]             [phone]        Jenefer S Patti [place] Medical Record #[id number] Date of Birth: 1946-01-22  Referring: No ref. provider found Primary Care: Julian Hy, PA-C Primary Cardiologist:No primary care provider on file.  Chief Complaint:   No chief complaint on file.   History of Present Illness:     74 year old female admitted following an elective left heart cath which showed significant two-vessel disease.  She has a long history of exertional dyspnea with occasional chest pain.  She was admitted earlier this year with hypertensive urgency and pulmonary edema but left AMA.  CTS was consulted to assist with management of her care.     Past Medical History:  Diagnosis Date  . Diabetes mellitus without complication ([place])   . Dyspnea   . Hypertension     Past Surgical History:  Procedure Laterality Date  . NO PAST SURGERIES        Social History   Tobacco Use  Smoking Status Current Every Day Smoker  . Packs/day: 0.50  . Types: Cigarettes  Smokeless Tobacco Never Used    Social History   Substance and Sexual Activity  Alcohol Use No     Allergies  Allergen Reactions  . Tramadol Nausea And Vomiting      Current Facility-Administered Medications  Medication Dose Route Frequency Provider Last Rate Last Admin  . 0.9 %  sodium chloride infusion   Intravenous Continuous Lorretta Harp, MD 75 mL/hr at 01/31/20 1656 New Bag at 01/31/20 1656  . 0.9 %  sodium chloride infusion  250 mL Intravenous PRN Lorretta Harp, MD      . acetaminophen (TYLENOL) tablet 650 mg  650 mg Oral Q4H PRN Lorretta Harp, MD      . amLODipine (NORVASC) tablet 5 mg  5 mg Oral Daily Lorretta Harp, MD      . aspirin EC tablet 81 mg  81 mg Oral Daily Lorretta Harp, MD      . atorvastatin (LIPITOR) tablet 80 mg  80 mg Oral Daily Lorretta Harp, MD      . carvedilol (COREG) tablet 12.5 mg   12.5 mg Oral BID WC Lorretta Harp, MD      . furosemide (LASIX) tablet 20 mg  20 mg Oral Daily Lorretta Harp, MD      . hydrALAZINE (APRESOLINE) injection 10 mg  10 mg Intravenous Q20 Min PRN Lorretta Harp, MD      . isosorbide dinitrate (ISORDIL) tablet 20 mg  20 mg Oral BID Lorretta Harp, MD      . labetalol (NORMODYNE) injection 10 mg  10 mg Intravenous Q10 min PRN Lorretta Harp, MD      . Derrill Memo ON 02/01/2020] losartan (COZAAR) tablet 100 mg  100 mg Oral Daily Lorretta Harp, MD      . ondansetron [facility]) injection 4 mg  4 mg Intravenous Q6H PRN Lorretta Harp, MD      . sodium chloride flush (NS) 0.9 % injection 3 mL  3 mL Intravenous Q12H Lorretta Harp, MD      . sodium chloride flush (NS) 0.9 % injection 3 mL  3 mL Intravenous PRN Lorretta Harp, MD        Medications Prior to Admission  Medication Sig Dispense  Refill Last Dose  . amLODipine (NORVASC) 5 MG tablet Take 5 mg by mouth daily.   01/31/2020 at Unknown time  . aspirin EC 81 MG tablet Take 81 mg by mouth daily.   01/31/2020 at Unknown time  . bismuth subsalicylate (PEPTO BISMOL) 262 MG/15ML suspension Take 30 mLs by mouth every 6 (six) hours as needed for indigestion.   01/30/2020 at Unknown time  . calcium carbonate (TUMS - DOSED IN MG ELEMENTAL CALCIUM) 500 MG chewable tablet Chew 1 tablet by mouth daily as needed for indigestion or heartburn.   01/30/2020 at Unknown time  . carvedilol (COREG) 12.5 MG tablet Take 12.5 mg by mouth 2 (two) times daily with a meal.   01/31/2020 at Unknown time  . furosemide (LASIX) 20 MG tablet Take 20 mg by mouth daily.    01/30/2020 at Unknown time  . isosorbide dinitrate (ISORDIL) 20 MG tablet Take 20 mg by mouth 2 (two) times daily.   01/31/2020 at Unknown time  . losartan (COZAAR) 100 MG tablet Take 100 mg by mouth daily.    01/31/2020 at Unknown time  . meloxicam (MOBIC) 15 MG tablet Take 15 mg by mouth daily.   01/31/2020 at Unknown time  . metFORMIN (GLUCOPHAGE)  1000 MG tablet TAKE 1 TABLET(1000 MG) BY MOUTH TWICE DAILY (Patient taking differently: Take 1,000 mg by mouth 2 (two) times daily with a meal. ) 60 tablet 0 01/30/2020 at Unknown time  . Blood Pressure Monitoring (ADULT BLOOD PRESSURE CUFF LG) KIT Use to check blood pressure twice a day, keep a running log of your blood pressures 1 each 0   . glucose blood (CONTOUR NEXT TEST) test strip Use as instructed 100 each 12   . glucose monitoring kit (FREESTYLE) monitoring kit 1 each by Does not apply route 2 (two) times daily. 1 each 0   . MICROLET LANCETS MISC USE TO TEST BLOOD SUGAR ONCE DAILY 300 each 0     Family History  Problem Relation Age of Onset  . Diabetes Mother   . Diabetes Sister   . Diabetes Brother      Review of Systems:   Review of Systems  Constitutional: Negative.   Respiratory: Positive for shortness of breath.   Cardiovascular: Positive for chest pain.  Musculoskeletal: Negative.   Neurological: Negative.       Physical Exam: BP (!) 161/84 (BP Location: Left Arm)   Pulse 74   Temp 98.1 F (36.7 C) (Oral)   Resp 18   Ht '5\' 6"'$  (1.676 m)   Wt 71.8 kg   SpO2 94%   BMI 25.55 kg/m  Physical Exam  Constitutional: She is oriented to person, place, and time and well-developed, well-nourished, and in no distress.  HENT:  Head: Normocephalic and atraumatic.  Eyes: Conjunctivae are normal.  Neck: No tracheal deviation present.  Cardiovascular: Normal rate, regular rhythm and normal heart sounds.  Pulmonary/Chest: Effort normal. No respiratory distress.  Abdominal: She exhibits no distension.  Musculoskeletal:        General: No edema. Normal range of motion.     Cervical back: Normal range of motion.  Neurological: She is alert and oriented to person, place, and time.  Skin: Skin is warm and dry.      Diagnostic Studies & Laboratory data:    Left Heart Catherization: Left dominant system with significant disease in the LAD, ramus and marginal branches   Echo: Preserved LV and RV function with no significant valvular disease   I  have independently reviewed the above radiologic studies and discussed with the patient   Recent Lab Findings: Lab Results  Component Value Date   WBC 6.1 01/29/2020   HGB 12.7 01/29/2020   HCT 39.8 01/29/2020   PLT 209 01/29/2020   GLUCOSE 128 (H) 01/29/2020   CHOL 230 (H) 01/06/2020   TRIG 103 01/06/2020   HDL 57 01/06/2020   LDLCALC 152 (H) 01/06/2020   ALT 7 02/24/2017   AST 12 02/24/2017   NA 144 01/29/2020   K 4.2 01/29/2020   CL 105 01/29/2020   CREATININE 0.82 01/29/2020   BUN 14 01/29/2020   CO2 24 01/29/2020   TSH 1.037 01/06/2020   INR 1.03 04/24/2016   HGBA1C 9.0 (H) 01/06/2020      Assessment / Plan:   74 year old female was admitted with severe two-vessel disease after undergoing an elective left heart cath.  She has a long history of exertional dyspnea, but is adamantly refusing open heart surgery.  I explained to her and her children that based on her anatomy surgery would be the best option for complete revascularization.  Despite explaining the risks and benefits of surgical revascularization she remained resolute in her decision to not proceed with surgery.  I have given her the option to come back and see me as an outpatient to go over this in more detail.  Her daughter is agreeable with that plan.     I  spent 40 minutes counseling the patient face to face.   Lajuana Matte 01/31/2020 7:03 PM

## 2020-01-31 NOTE — Interval H&P Note (Signed)
Cath Lab Visit (complete for each Cath Lab visit)  Clinical Evaluation Leading to the Procedure:   ACS: No.  Non-ACS:    Anginal Classification: CCS II  Anti-ischemic medical therapy: Minimal Therapy (1 class of medications)  Non-Invasive Test Results: No non-invasive testing performed  Prior CABG: No previous CABG      History and Physical Interval Note:  01/31/2020 4:01 PM  Bianca [name]  has presented today for surgery, with the diagnosis of nstemi.  The various methods of treatment have been discussed with the patient and family. After consideration of risks, benefits and other options for treatment, the patient has consented to  Procedure(s): LEFT HEART CATH AND CORONARY ANGIOGRAPHY (N/A) as a surgical intervention.  The patient's history has been reviewed, patient examined, no change in status, stable for surgery.  I have reviewed the patient's chart and labs.  Questions were answered to the patient's satisfaction.     Nanetta Batty

## 2020-01-31 NOTE — Progress Notes (Signed)
Updated patient that Dr. Allyson Sabal was running behind.  Patient stated she could not understand and was feeling like we " did not care about her."  I assured patient that we did care about her and that I would have cath Lab talk with her.  Also rechecked patient's blood sugar which was 105.  Patient denied any symptoms of low blood sugar but stated she was having pains due to hunger.

## 2020-02-01 DIAGNOSIS — I25118 Atherosclerotic heart disease of native coronary artery with other forms of angina pectoris: Secondary | ICD-10-CM

## 2020-02-01 LAB — GLUCOSE, CAPILLARY
Glucose-Capillary: 219 mg/dL — ABNORMAL HIGH (ref 70–99)
Glucose-Capillary: 192 mg/dL — ABNORMAL HIGH (ref 70–99)

## 2020-02-01 MED ORDER — INSULIN ASPART 100 UNIT/ML ~~LOC~~ SOLN: 0.0000 [IU] | Freq: Three times a day (TID) | SUBCUTANEOUS | Status: DC

## 2020-02-01 MED ORDER — ISOSORBIDE MONONITRATE ER 30 MG PO TB24: 30.0000 mg | ORAL_TABLET | Freq: Every day | ORAL | 6 refills | Status: DC

## 2020-02-01 MED ORDER — CARVEDILOL 25 MG PO TABS: 25.0000 mg | ORAL_TABLET | Freq: Two times a day (BID) | ORAL | 6 refills | Status: DC

## 2020-02-01 MED ORDER — ALUM & MAG HYDROXIDE-SIMETH 200-200-20 MG/5ML PO SUSP
30.0000 mL | ORAL | Status: DC | PRN
  Administered 2020-02-01: 07:00:00 30 mL via ORAL
  Filled 2020-02-01: qty 30

## 2020-02-01 MED ORDER — NITROGLYCERIN 0.4 MG SL SUBL
0.4000 mg | SUBLINGUAL_TABLET | SUBLINGUAL | 12 refills | Status: DC | PRN
Start: 2020-02-01 — End: 2020-02-15

## 2020-02-01 MED ORDER — CARVEDILOL 25 MG PO TABS: 25.0000 mg | ORAL_TABLET | Freq: Two times a day (BID) | ORAL | Status: DC

## 2020-02-01 MED ORDER — ATORVASTATIN CALCIUM 80 MG PO TABS: 80.0000 mg | ORAL_TABLET | Freq: Every day | ORAL | 6 refills | Status: DC

## 2020-02-01 MED ORDER — FUROSEMIDE 20 MG PO TABS: 20.0000 mg | ORAL_TABLET | Freq: Every day | ORAL | Status: DC

## 2020-02-01 MED ORDER — ISOSORBIDE MONONITRATE ER 30 MG PO TB24
30.0000 mg | ORAL_TABLET | Freq: Every day | ORAL | Status: DC
  Administered 2020-02-01: 10:00:00 30 mg via ORAL
  Filled 2020-02-01: qty 1

## 2020-02-01 MED FILL — CARVEDILOL 25 MG TABLET: 25 | 30 days supply | Qty: 60 | Fill #0

## 2020-02-01 MED FILL — ISOSORBIDE MN ER 30 MG TAB: 30 | 30 days supply | Qty: 30 | Fill #0

## 2020-02-01 MED FILL — NITROGLYCERIN 0.4 MG TAB SL: 0.4 | 8 days supply | Qty: 25 | Fill #0

## 2020-02-01 MED FILL — ATORVASTATIN CALCIUM 80 MG: 80 | 30 days supply | Qty: 30 | Fill #0

## 2020-02-01 NOTE — Care Management CC44 (Signed)
Condition Code 44 Documentation Completed  Patient Details  Name: Bianca [name] MRN: [id number] Date of Birth: 1945/11/24   Condition Code 44 given:  Yes Patient signature on Condition Code 44 notice:  Yes Documentation of 2 MD's agreement:  Yes Code 44 added to claim:  Yes    Gala Lewandowsky, RN 02/01/2020, 11:30 AM

## 2020-02-01 NOTE — Progress Notes (Addendum)
The patient has been seen in conjunction with Vin Bhagat, PAC. All aspects of care have been considered and discussed. The patient has been personally interviewed, examined, and all clinical data has been reviewed.   Extensive conversation with the patient and daughter.  Personally reviewed the digital images with the patient and daughter explaining the difference between diffuse nonfocal disease and focal CAD.  The limited benefit of stent implantation in the current situation was also explained.  The likelihood that the stent implantation would only occur in the setting of ACS.  Best option for long-term LV preservation and survival is CABG.  Discussed silent ischemia/dyspnea as an anginal equivalent with her now having the understanding that significant dyspnea requires rest and sublingual nitroglycerin if it persists.  Anti-ischemic regimen includes beta-blocker, long-acting nitrate, calcium channel blocker, and aspirin.  Explained the benefit of sublingual nitroglycerin use.  Aggressive blood pressure control with medication compliance is important.  LDL target less than 70 and perhaps lower possibly requiring addition of ezetimibe or PCSK9.  Dr. Kipp Brood present for the last quarter of my prolonged encounter with the patient.  She is ready for discharge today.  Access site is unremarkable.  Follow-up with Dr. Gwenlyn Found for further dialogue concerning long-term management.   Progress Note  Patient Name: Bianca [name] Date of Encounter: 02/01/2020  Primary Cardiologist:Dr. Gwenlyn Found   Subjective   Feeling well. No chest pain, sob or palpitations.   Inpatient Medications    Scheduled Meds: . amLODipine  5 mg Oral Daily  . aspirin EC  81 mg Oral Daily  . atorvastatin  80 mg Oral Daily  . carvedilol  12.5 mg Oral BID WC  . furosemide  20 mg Oral Daily  . isosorbide dinitrate  20 mg Oral BID  . losartan  100 mg Oral Daily  . sodium chloride flush  3 mL Intravenous Q12H    Continuous Infusions: . sodium chloride     PRN Meds: sodium chloride, acetaminophen, alum & mag hydroxide-simeth, ondansetron (ZOFRAN) IV, sodium chloride flush   Vital Signs    Vitals:   01/31/20 1751 01/31/20 1956 02/01/20 0016 02/01/20 0527  BP: (!) 161/84 (!) 163/82 (!) 157/72 (!) 154/75  Pulse:  75 69 74  Resp: 18     Temp: 98.1 F (36.7 C) 97.9 F (36.6 C) 97.9 F (36.6 C) 98.1 F (36.7 C)  TempSrc: Oral Oral Oral Oral  SpO2:  100% 98% 97%  Weight:    70.4 kg  Height:        Intake/Output Summary (Last 24 hours) at 02/01/2020 0828 Last data filed at 01/31/2020 1709 Gross per 24 hour  Intake --  Output 200 ml  Net -200 ml   Last 3 Weights 02/01/2020 01/31/2020 01/29/2020  Weight (lbs) 155 lb 3.2 oz 158 lb 4.6 oz 158 lb 6.4 oz  Weight (kg) 70.398 kg 71.8 kg 71.85 kg      Telemetry    NSR with PVCs - Personally Reviewed  ECG  N/A  Physical Exam   GEN: No acute distress.   Neck: No JVD Cardiac: RRR, no murmurs, rubs, or gallops. Right radial cath site stable.  Respiratory: Clear to auscultation bilaterally. GI: Soft, nontender, non-distended  MS: No edema; No deformity. Neuro:  Nonfocal  Psych: Normal affect   Labs    High Sensitivity Troponin:   Recent Labs  Lab 01/06/20 1132 01/06/20 1448 01/06/20 1727 01/07/20 0610 01/07/20 0956  TROPONINIHS 462* 800* 1,304* 1,532* 1,582*  Chemistry Recent Labs  Lab 01/29/20 1230  NA 144  K 4.2  CL 105  CO2 24  GLUCOSE 128*  BUN 14  CREATININE 0.82  CALCIUM 9.1  GFRNONAA 71  GFRAA 82     Hematology Recent Labs  Lab 01/29/20 1230  WBC 6.1  RBC 4.68  HGB 12.7  HCT 39.8  MCV 85  MCH 27.1  MCHC 31.9  RDW 13.6  PLT 209    Radiology    CARDIAC CATHETERIZATION  Result Date: 01/31/2020  Ost LM lesion is 40% stenosed.  Ramus lesion is 90% stenosed.  LPAV lesion is 90% stenosed.  2nd Mrg-1 lesion is 95% stenosed.  2nd Mrg-2 lesion is 90% stenosed.  1st Diag lesion is 90%  stenosed.  Dist LAD lesion is 90% stenosed.  2nd Diag lesion is 90% stenosed.  Prox LAD lesion is 60% stenosed.  Mid LAD lesion is 90% stenosed.  Bianca [name] is a 74 y.o. female  [id number] LOCATION:  FACILITY: [facility] PHYSICIAN: Nanetta Batty, M.D. 01/08/1946 DATE OF PROCEDURE:  01/31/2020 DATE OF DISCHARGE: CARDIAC CATHETERIZATION History obtained from chart review.Bianca [name] is a 74 y.o. thin appearing divorced/widowed African-American female mother of 2 children, grandmother of 1 grandchild who is accompanied by her daughter Bianca [name].  She is referred by Dr. Fritz Pickerel, cardiologist, for diagnostic coronary angiography.  She has a history of greater than 100 pack years of tobacco abuse having quit recently during hospitalization.  She has treated hypertension, diabetes and hyperlipidemia as well as family history with 2 brothers who have had stents.  She was admitted to [facility] last month with hypertensive urgency and pulmonary edema.  She was placed on a nitro drip and diuresed.  2D echo revealed no low normal LV function with normal valvular function.  She did leave AMA.  She has been having atypical left lateral chest and back pain.  Her echo was reviewed by her cardiologist who thought that she had subtle inferior hypokinesia and given her symptoms and risk factors recommended that she undergo diagnostic coronary angiography.   Bianca [name] has a left dominant system with high-grade disease in her proximal LAD, first and second diagonal branches, ramus branch, circumflex obtuse marginal branch and distal circumflex.  She has low normal LV function by recent 2D echo.  Her anatomy is suitable for CABG.  Sheath was removed and a TR band was placed on the right wrist to achieve patent hemostasis.  The patient left lab in stable condition.  T CTS has been consulted. Nanetta Batty. MD, [facility] 01/31/2020 4:51 PM    Cardiac Studies   LEFT HEART CATH AND CORONARY ANGIOGRAPHY   Conclusion    Ost LM lesion is 40% stenosed.  Ramus lesion is 90% stenosed.  LPAV lesion is 90% stenosed.  2nd Mrg-1 lesion is 95% stenosed.  2nd Mrg-2 lesion is 90% stenosed.  1st Diag lesion is 90% stenosed.  Dist LAD lesion is 90% stenosed.  2nd Diag lesion is 90% stenosed.  Prox LAD lesion is 60% stenosed.  Mid LAD lesion is 90% stenosed.    IMPRESSION: Bianca [name] has a left dominant system with high-grade disease in her proximal LAD, first and second diagonal branches, ramus branch, circumflex obtuse marginal branch and distal circumflex.  She has low normal LV function by recent 2D echo.  Her anatomy is suitable for CABG.  Sheath was removed and a TR band was placed on the right wrist to achieve patent hemostasis.  The patient  left lab in stable condition.  T CTS has been consulted.  Diagnostic Dominance: Left    Patient Profile     74 y.o. female with hx of recent admission for hypertensive urgency and acute pulmonary edema with NSTEMI, HLD, DM and tobacco abuse presented for outpatient cath.   Assessment & Plan    1. CAD - Cath as above showing severe 2 V disease. She declined CABG. No chest pain. She wants PCI.  - Had breakfast this morning - Continue ASA, statin, BB, Isordil>>Imdur  2. HTN - BP remain elevated.  - Will increase coreg   3. DM - Most recent A1c was 9 few weeks ago - SSI while here  4. Tobacco smoking - Reports quit 3 weeks ago  For questions or updates, please contact [facility] Please consult [email or web address] for contact info under     SignedManson Passey, PA  02/01/2020, 8:28 AM

## 2020-02-01 NOTE — Care Management Obs Status (Signed)
MEDICARE OBSERVATION STATUS NOTIFICATION   Patient Details  Name: Bianca [name] MRN: [id number] Date of Birth: 14-Feb-1946   Medicare Observation Status Notification Given:  Yes    Gala Lewandowsky, RN 02/01/2020, 11:30 AM

## 2020-02-01 NOTE — Discharge Summary (Addendum)
The patient has been seen in conjunction with Vin Bhagat, PAC. All aspects of care have been considered and discussed. The patient has been personally interviewed, examined, and all clinical data has been reviewed.   Please see the cardiology note from earlier this morning.  Sublingual nitroglycerin use explained.  Dyspnea as her anginal equivalent was explained.  She will contemplate whether to have multivessel PCI by Dr. Kipp Brood.  She would prefer having PCI but this seems inappropriate given the diffuse nature of her disease.  PCI would be appropriate for ACS.  Discharge Summary    Patient ID: Bianca [name] MRN: [id number]; DOB: 1945/10/21  Admit date: 01/31/2020 Discharge date: 02/01/2020  Primary Care Provider: Julian Hy, PA-C  Primary Cardiologist: Quay Burow, MD   Discharge Diagnoses    Active Problems:   Non-STEMI (non-ST elevated myocardial infarction) [facility]   CAD (coronary artery disease)   DM   HTN   Tobacco smoking   Diagnostic Studies/Procedures    LEFT HEART CATH AND CORONARY ANGIOGRAPHY  01/31/20     Ost LM lesion is 40% stenosed.  Ramus lesion is 90% stenosed.  LPAV lesion is 90% stenosed.  2nd Mrg-1 lesion is 95% stenosed.  2nd Mrg-2 lesion is 90% stenosed.  1st Diag lesion is 90% stenosed.  Dist LAD lesion is 90% stenosed.  2nd Diag lesion is 90% stenosed.  Prox LAD lesion is 60% stenosed.  Mid LAD lesion is 90% stenosed.    IMPRESSION:Bianca [name] has a left dominant system with high-grade disease in her proximal LAD, first and second diagonal branches, ramus branch, circumflex obtuse marginal branch and distal circumflex. She has low normal LV function by recent 2D echo. Her anatomy is suitable for CABG. Sheath was removed and a TR band was placed on the right wrist to achieve patent hemostasis. The patient left lab in stable condition. TCTS has been consulted.  Diagnostic Dominance: Left         History of Present Illness     Bianca [name] is a 74 y.o. female with recent admission for hypertensive urgency and acute pulmonary edema with NSTEMI, HLD, DM and tobacco abuse presented for outpatient cath.   Admitted 12/2019 with hypertensive urgency and pulmonary edema.  She was placed on a nitro drip and diuresed.  2D echo revealed low normal LV function (50-55%) with normal valvular function.  She did leave AMA.  She has been having atypical left lateral chest and back pain.  seen by Dr. Gwenlyn Found as outpatient and cath arranged.   Hospital Course     Consultants: None  1. CAD - Cath as above showing severe 2 V disease. She declined CABG. No chest pain. Anti-ischemic regimen includes beta-blocker, long-acting nitrate, calcium channel blocker, and aspirin. Follow up with Dr. Gwenlyn Found for further discussion.   2. HTN - BP remain elevated.  - Increased coreg to 79m BID.  - Isosordi changed to Imdur - Continue Amlodipine and Cozaar  3. DM - Most recent A1c was 9 few weeks ago - SSI while here - Only on metformin  - needs strict control. Consider SPGL2 inhibitor   4. Tobacco smoking - Reports quit 3 weeks ago  5. HLD - 01/06/2020: Cholesterol 230; HDL 57; LDL Cholesterol 152; Triglycerides 103; VLDL 21  - On Lipitor [id number] - Consider repeat lab in few weeks, if LDL above 70 Consider Zetia or PCSK9 inhibitor   Did the patient have an acute coronary syndrome (MI, NSTEMI, STEMI, etc) this admission?:  No                               Did the patient have a percutaneous coronary intervention (stent / angioplasty)?:  No.     Discharge Vitals Blood pressure (!) 163/83, pulse 68, temperature 98 F (36.7 C), temperature source Oral, resp. rate 16, height 5' 6" (1.676 m), weight 70.4 kg, SpO2 99 %.  Filed Weights   01/31/20 1125 02/01/20 0527  Weight: 71.8 kg 70.4 kg    Labs & Radiologic Studies    CBC No results for input(s): WBC, NEUTROABS, HGB, HCT, MCV, PLT in the last 72  hours. Basic Metabolic Panel No results for input(s): NA, K, CL, CO2, GLUCOSE, BUN, CREATININE, CALCIUM, MG, PHOS in the last 72 hours. High Sensitivity Troponin:   Recent Labs  Lab 01/06/20 1132 01/06/20 1448 01/06/20 1727 01/07/20 0610 01/07/20 0956  TROPONINIHS 462* 800* 1,304* 1,532* 1,582*    _____________  CARDIAC CATHETERIZATION  Result Date: 01/31/2020  Ost LM lesion is 40% stenosed.  Ramus lesion is 90% stenosed.  LPAV lesion is 90% stenosed.  2nd Mrg-1 lesion is 95% stenosed.  2nd Mrg-2 lesion is 90% stenosed.  1st Diag lesion is 90% stenosed.  Dist LAD lesion is 90% stenosed.  2nd Diag lesion is 90% stenosed.  Prox LAD lesion is 60% stenosed.  Mid LAD lesion is 90% stenosed.  Bianca [name] is a 74 y.o. female  [id number] LOCATION:  FACILITY: [facility] PHYSICIAN: Quay Burow, M.D. Dec 06, 1945 DATE OF PROCEDURE:  01/31/2020 DATE OF DISCHARGE: CARDIAC CATHETERIZATION History obtained from chart review.Bianca [name] is a 74 y.o. thin appearing divorced/widowed African-American female mother of 2 children, grandmother of 1 grandchild who is accompanied by her daughter Bianca [name].  She is referred by Dr. Mardene Speak, cardiologist, for diagnostic coronary angiography.  She has a history of greater than 100 pack years of tobacco abuse having quit recently during hospitalization.  She has treated hypertension, diabetes and hyperlipidemia as well as family history with 2 brothers who have had stents.  She was admitted to [facility] last month with hypertensive urgency and pulmonary edema.  She was placed on a nitro drip and diuresed.  2D echo revealed no low normal LV function with normal valvular function.  She did leave AMA.  She has been having atypical left lateral chest and back pain.  Her echo was reviewed by her cardiologist who thought that she had subtle inferior hypokinesia and given her symptoms and risk factors recommended that she undergo diagnostic coronary angiography.     Bianca [name] has a left dominant system with high-grade disease in her proximal LAD, first and second diagonal branches, ramus branch, circumflex obtuse marginal branch and distal circumflex.  She has low normal LV function by recent 2D echo.  Her anatomy is suitable for CABG.  Sheath was removed and a TR band was placed on the right wrist to achieve patent hemostasis.  The patient left lab in stable condition.  T CTS has been consulted. Quay Burow. MD, [facility] 01/31/2020 4:51 PM   DG Chest Port 1 View  Result Date: 01/06/2020 CLINICAL DATA:  Dyspnea EXAM: PORTABLE CHEST 1 VIEW COMPARISON:  June 18, 2017 FINDINGS: The mediastinal contour is normal. Heart size is slightly enlarged. Patchy consolidation of both lung bases, right greater than left are noted. Increased pulmonary interstitium is identified bilaterally. No acute abnormality is identified in the osseous structures. IMPRESSION: 1.  Mild interstitial edema. 2. Patchy consolidation of both lung bases, right greater than left, pneumonia is not excluded. Electronically Signed   By: Abelardo Diesel M.D.   On: 01/06/2020 11:52   ECHOCARDIOGRAM COMPLETE  Result Date: 01/06/2020    ECHOCARDIOGRAM REPORT   Patient Name:   Bianca [name] Date of Exam: 01/06/2020 Medical Rec #:  [id number]        Height:       66.0 in Accession #:    [id number]       Weight:       158.0 lb Date of Birth:  1946-02-06        BSA:          1.809 m Patient Age:    36 years         BP:           161/83 mmHg Patient Gender: F                HR:           99 bpm. Exam Location:  Inpatient Procedure: 2D Echo Indications:    acute diastolic chf 301.60  History:        Patient has no prior history of Echocardiogram examinations.  Sonographer:    Johny Chess Referring Phys: [id number] [place]  1. Left ventricular ejection fraction, by estimation, is 50 to 55%. The left ventricle has low normal function. The left ventricle has no regional wall motion abnormalities.  There is mild concentric left ventricular hypertrophy. Indeterminate diastolic filling due to E-A fusion.  2. Right ventricular systolic function is normal. The right ventricular size is normal. Tricuspid regurgitation signal is inadequate for assessing PA pressure.  3. The mitral valve is grossly normal. Trivial mitral valve regurgitation.  4. The aortic valve is tricuspid. Aortic valve regurgitation is not visualized. No aortic stenosis is present.  5. The inferior vena cava is normal in size with greater than 50% respiratory variability, suggesting right atrial pressure of 3 mmHg. FINDINGS  Left Ventricle: Left ventricular ejection fraction, by estimation, is 50 to 55%. The left ventricle has low normal function. The left ventricle has no regional wall motion abnormalities. The left ventricular internal cavity size was normal in size. There is mild concentric left ventricular hypertrophy. Indeterminate diastolic filling due to E-A fusion. Right Ventricle: The right ventricular size is normal. No increase in right ventricular wall thickness. Right ventricular systolic function is normal. Tricuspid regurgitation signal is inadequate for assessing PA pressure. Left Atrium: Left atrial size was normal in size. Right Atrium: Right atrial size was normal in size. Pericardium: Trivial pericardial effusion is present. Presence of pericardial fat pad. Mitral Valve: The mitral valve is grossly normal. Trivial mitral valve regurgitation. Tricuspid Valve: The tricuspid valve is grossly normal. Tricuspid valve regurgitation is mild . No evidence of tricuspid stenosis. Aortic Valve: The aortic valve is tricuspid. Aortic valve regurgitation is not visualized. No aortic stenosis is present. Pulmonic Valve: The pulmonic valve was grossly normal. Pulmonic valve regurgitation is trivial. No evidence of pulmonic stenosis. Aorta: The aortic root is normal in size and structure. Venous: The inferior vena cava is normal in size with  greater than 50% respiratory variability, suggesting right atrial pressure of 3 mmHg. IAS/Shunts: The atrial septum is grossly normal. Additional Comments: There is a small pleural effusion in the left lateral region.  LEFT VENTRICLE PLAX 2D LVIDd:         4.80 cm LVIDs:  3.60 cm LV PW:         1.20 cm LV IVS:        1.00 cm LVOT diam:     1.90 cm LV SV:         47 LV SV Index:   26 LVOT Area:     2.84 cm  LV Volumes (MOD) LV vol d, MOD A2C: 108.0 ml LV vol s, MOD A2C: 71.2 ml LV SV MOD A2C:     36.8 ml RIGHT VENTRICLE RV S prime:     11.00 cm/s TAPSE (M-mode): 2.0 cm LEFT ATRIUM             Index       RIGHT ATRIUM           Index LA diam:        4.00 cm 2.21 cm/m  RA Area:     13.00 cm LA Vol (A2C):   52.8 ml 29.18 ml/m RA Volume:   29.90 ml  16.53 ml/m LA Vol (A4C):   48.7 ml 26.92 ml/m LA Biplane Vol: 51.6 ml 28.52 ml/m  AORTIC VALVE LVOT Vmax:   109.00 cm/s LVOT Vmean:  63.100 cm/s LVOT VTI:    0.167 m  AORTA Ao Root diam: 3.10 cm  SHUNTS Systemic VTI:  0.17 m Systemic Diam: 1.90 cm Eleonore Chiquito MD Electronically signed by Eleonore Chiquito MD Signature Date/Time: 01/06/2020/5:19:20 PM    Final    Disposition   Pt is being discharged home today in good condition.  Follow-up Plans & Appointments    Follow-up Information    Lorretta Harp, MD. Go on 02/13/2020.   Specialties: Cardiology, Radiology Why: _0 :15am for cath follow up  Contact information: [address] [place] [address] [phone]          Discharge Instructions    Diet - low sodium heart healthy   Complete by: As directed    Discharge instructions   Complete by: As directed    No driving for 48. No lifting over 5 lbs for 1 week. No sexual activity for 1 week. Keep procedure site clean & dry. If you notice increased pain, swelling, bleeding or pus, call/return!  You may shower, but no soaking baths/hot tubs/pools for 1 week. Hold metformin for 2 days, resume Sunday 02/03/20.   Increase  activity slowly   Complete by: As directed       Discharge Medications   Allergies as of 02/01/2020      Reactions   Tramadol Nausea And Vomiting      Medication List    STOP taking these medications   isosorbide dinitrate 20 MG tablet Commonly known as: ISORDIL   meloxicam 15 MG tablet Commonly known as: MOBIC     TAKE these medications   Adult Blood Pressure Cuff Lg Kit Use to check blood pressure twice a day, keep a running log of your blood pressures   amLODipine 5 MG tablet Commonly known as: NORVASC Take 5 mg by mouth daily.   aspirin EC 81 MG tablet Take 81 mg by mouth daily.   atorvastatin 80 MG tablet Commonly known as: LIPITOR Take 1 tablet (80 mg total) by mouth daily.   bismuth subsalicylate 025 EN/27PO suspension Commonly known as: PEPTO BISMOL Take 30 mLs by mouth every 6 (six) hours as needed for indigestion.   calcium carbonate 500 MG chewable tablet Commonly known as: TUMS - dosed in mg elemental calcium Chew 1 tablet by mouth daily  as needed for indigestion or heartburn.   carvedilol 25 MG tablet Commonly known as: COREG Take 1 tablet (25 mg total) by mouth 2 (two) times daily with a meal. What changed:   medication strength  how much to take   furosemide 20 MG tablet Commonly known as: LASIX Take 20 mg by mouth daily.   glucose blood test strip Commonly known as: Contour Next Test Use as instructed   glucose monitoring kit monitoring kit 1 each by Does not apply route 2 (two) times daily.   isosorbide mononitrate 30 MG 24 hr tablet Commonly known as: IMDUR Take 1 tablet (30 mg total) by mouth daily.   losartan 100 MG tablet Commonly known as: COZAAR Take 100 mg by mouth daily.   metFORMIN 1000 MG tablet Commonly known as: GLUCOPHAGE TAKE 1 TABLET(1000 MG) BY MOUTH TWICE DAILY What changed: See the new instructions.   Microlet Lancets Misc USE TO TEST BLOOD SUGAR ONCE DAILY   nitroGLYCERIN 0.4 MG SL tablet Commonly  known as: Nitrostat Place 1 tablet (0.4 mg total) under the tongue every 5 (five) minutes as needed.        Outstanding Labs/Studies   None  Duration of Discharge Encounter   Greater than 30 minutes including physician time.  Jarrett Soho, PA 02/01/2020, 12:33 PM

## 2020-02-01 NOTE — Plan of Care (Signed)
  Problem: Education: Goal: Knowledge of General Education information will improve Description: Including pain rating scale, medication(s)/side effects and non-pharmacologic comfort measures Outcome: Progressing   Problem: Education: Goal: Understanding of CV disease, CV risk reduction, and recovery process will improve Outcome: Progressing Goal: Individualized Educational Video(s) Outcome: Progressing   Problem: Education: Goal: Individualized Educational Video(s) Outcome: Progressing

## 2020-02-01 NOTE — Plan of Care (Signed)
Pt d/c to home with self care, d/c education completed, printed d/c instructions given to pt with verbal explanation, verbal understanding received, left facility via private vehicle accompanied by daughter.

## 2020-02-02 ENCOUNTER — Emergency Department (HOSPITAL_COMMUNITY): Payer: Medicare Other

## 2020-02-02 ENCOUNTER — Other Ambulatory Visit: Payer: Self-pay

## 2020-02-02 ENCOUNTER — Inpatient Hospital Stay (HOSPITAL_COMMUNITY)
Admission: EM | Admit: 2020-02-02 | Discharge: 2020-02-15 | DRG: 229 | Disposition: A | Discharge status: Home Health | Payer: Medicare Other | Attending: Thoracic Surgery (Cardiothoracic Vascular Surgery) | Admitting: Thoracic Surgery (Cardiothoracic Vascular Surgery)

## 2020-02-02 ENCOUNTER — Encounter (HOSPITAL_COMMUNITY): Payer: Self-pay | Admitting: Emergency Medicine

## 2020-02-02 DIAGNOSIS — Z7982 Long term (current) use of aspirin: Secondary | ICD-10-CM

## 2020-02-02 DIAGNOSIS — I5032 Chronic diastolic (congestive) heart failure: Secondary | ICD-10-CM | POA: Diagnosis present

## 2020-02-02 DIAGNOSIS — Z885 Allergy status to narcotic agent status: Secondary | ICD-10-CM

## 2020-02-02 DIAGNOSIS — E785 Hyperlipidemia, unspecified: Secondary | ICD-10-CM | POA: Diagnosis present

## 2020-02-02 DIAGNOSIS — I252 Old myocardial infarction: Secondary | ICD-10-CM

## 2020-02-02 DIAGNOSIS — I214 Non-ST elevation (NSTEMI) myocardial infarction: Secondary | ICD-10-CM

## 2020-02-02 DIAGNOSIS — I10 Essential (primary) hypertension: Secondary | ICD-10-CM | POA: Diagnosis present

## 2020-02-02 DIAGNOSIS — Z951 Presence of aortocoronary bypass graft: Secondary | ICD-10-CM

## 2020-02-02 DIAGNOSIS — I25110 Atherosclerotic heart disease of native coronary artery with unstable angina pectoris: Principal | ICD-10-CM | POA: Diagnosis present

## 2020-02-02 DIAGNOSIS — Z888 Allergy status to other drugs, medicaments and biological substances status: Secondary | ICD-10-CM

## 2020-02-02 DIAGNOSIS — D696 Thrombocytopenia, unspecified: Secondary | ICD-10-CM | POA: Diagnosis present

## 2020-02-02 DIAGNOSIS — Z7984 Long term (current) use of oral hypoglycemic drugs: Secondary | ICD-10-CM

## 2020-02-02 DIAGNOSIS — R079 Chest pain, unspecified: Secondary | ICD-10-CM | POA: Diagnosis not present

## 2020-02-02 DIAGNOSIS — I5031 Acute diastolic (congestive) heart failure: Secondary | ICD-10-CM | POA: Diagnosis present

## 2020-02-02 DIAGNOSIS — D62 Acute posthemorrhagic anemia: Secondary | ICD-10-CM | POA: Diagnosis not present

## 2020-02-02 DIAGNOSIS — E876 Hypokalemia: Secondary | ICD-10-CM | POA: Diagnosis not present

## 2020-02-02 DIAGNOSIS — E119 Type 2 diabetes mellitus without complications: Secondary | ICD-10-CM | POA: Diagnosis present

## 2020-02-02 DIAGNOSIS — I160 Hypertensive urgency: Secondary | ICD-10-CM

## 2020-02-02 DIAGNOSIS — R072 Precordial pain: Secondary | ICD-10-CM

## 2020-02-02 DIAGNOSIS — J90 Pleural effusion, not elsewhere classified: Secondary | ICD-10-CM

## 2020-02-02 DIAGNOSIS — E7800 Pure hypercholesterolemia, unspecified: Secondary | ICD-10-CM | POA: Diagnosis present

## 2020-02-02 DIAGNOSIS — I440 Atrioventricular block, first degree: Secondary | ICD-10-CM | POA: Diagnosis present

## 2020-02-02 DIAGNOSIS — Z20822 Contact with and (suspected) exposure to covid-19: Secondary | ICD-10-CM | POA: Diagnosis present

## 2020-02-02 DIAGNOSIS — F17210 Nicotine dependence, cigarettes, uncomplicated: Secondary | ICD-10-CM | POA: Diagnosis present

## 2020-02-02 DIAGNOSIS — I2510 Atherosclerotic heart disease of native coronary artery without angina pectoris: Secondary | ICD-10-CM | POA: Diagnosis present

## 2020-02-02 DIAGNOSIS — I200 Unstable angina: Secondary | ICD-10-CM

## 2020-02-02 DIAGNOSIS — Z79899 Other long term (current) drug therapy: Secondary | ICD-10-CM

## 2020-02-02 DIAGNOSIS — I110 Hypertensive heart disease with heart failure: Secondary | ICD-10-CM | POA: Diagnosis present

## 2020-02-02 LAB — BASIC METABOLIC PANEL
Anion gap: 7 (ref 5–15)
BUN: 16 mg/dL (ref 8–23)
CO2: 29 mmol/L (ref 22–32)
Calcium: 8.6 mg/dL — ABNORMAL LOW (ref 8.9–10.3)
Chloride: 106 mmol/L (ref 98–111)
Creatinine, Ser: 0.81 mg/dL (ref 0.44–1.00)
GFR calc Af Amer: >60 mL/min (ref >60)
GFR calc non Af Amer: >60 mL/min (ref >60)
Glucose, Bld: 157 mg/dL — ABNORMAL HIGH (ref 70–99)
Potassium: 3.3 mmol/L — ABNORMAL LOW (ref 3.5–5.1)
Sodium: 142 mmol/L (ref 135–145)

## 2020-02-02 LAB — CBC
HCT: 38.6 % (ref 36.0–46.0)
Hemoglobin: 11.7 g/dL — ABNORMAL LOW (ref 12.0–15.0)
MCH: 27 pg (ref 26.0–34.0)
MCHC: 30.3 g/dL (ref 30.0–36.0)
MCV: 88.9 fL (ref 80.0–100.0)
Platelets: 199 10*3/uL (ref 150–400)
RBC: 4.34 MIL/uL (ref 3.87–5.11)
RDW: 14.1 % (ref 11.5–15.5)
WBC: 6.2 10*3/uL (ref 4.0–10.5)
nRBC: 0 % (ref 0.0–0.2)

## 2020-02-02 LAB — BRAIN NATRIURETIC PEPTIDE: B Natriuretic Peptide: 466.5 pg/mL — ABNORMAL HIGH (ref 0.0–100.0)

## 2020-02-02 LAB — TROPONIN I (HIGH SENSITIVITY)
Troponin I (High Sensitivity): 25 ng/L — ABNORMAL HIGH (ref <18)
Troponin I (High Sensitivity): 28 ng/L — ABNORMAL HIGH (ref <18)

## 2020-02-02 LAB — CBG MONITORING, ED: Glucose-Capillary: 105 mg/dL — ABNORMAL HIGH (ref 70–99)

## 2020-02-02 MED ORDER — SODIUM CHLORIDE 0.9% FLUSH: 3.0000 mL | Freq: Once | INTRAVENOUS | Status: DC

## 2020-02-02 MED ORDER — NITROGLYCERIN IN D5W 200-5 MCG/ML-% IV SOLN
0.0000 ug/min | INTRAVENOUS | Status: DC
  Administered 2020-02-03: 200 ug/min via INTRAVENOUS
  Filled 2020-02-02: qty 250

## 2020-02-02 NOTE — ED Provider Notes (Addendum)
[facility] Provider Note   CSN: [id number] Arrival date & time: 02/02/20  1537     History Chief Complaint  Patient presents with  . Chest Pain    Bianca [name] is a 74 y.o. female with history of diabetes mellitus, hypertension, coronary artery disease, CHF, tobacco abuse presenting for evaluation of acute onset, resolved chest pain.  She reports that at around 10 AM today while sitting at rest she developed substernal stabbing chest pains sometime after taking her morning medications and eating breakfast.  Reports that the pain did not radiate but it was associated with some shortness of breath and nausea.  She denies diaphoresis, vomiting, syncope.  She took 2 sublingual nitroglycerin with improvement but then had a second episode of chest pain shortly thereafter.  She is unsure how long her chest pain lasted.  She states that it worsened with deep inspiration.  She is currently chest pain-free and has no pain with deep inspiration but does note that her shortness of breath has persisted and worsens with exertion.  She denies fevers, cough, sick contacts.  She has been vaccinated for Covid.  She notes some lower extremity edema which she states is worse from baseline.  She states she recently quit smoking.  She reports compliance with her medications.  Of note patient recently had an admission at the end of last month for hypertensive urgency with pulmonary edema and signed out AMA.  She subsequently underwent diagnostic cardiac catheterization on 01/31/2020 which showed multivessel disease and was recommended for CABG but she would prefer PCI.  She discussed her options with cardiology and CT surgery at the time.  The history is provided by the patient and medical records.    HPI: A 74 year old patient with a history of treated diabetes, hypertension and hypercholesterolemia presents for evaluation of chest pain. Initial onset of pain was more than 6  hours ago. The patient's chest pain is described as heaviness/pressure/tightness, is sharp, is not worse with exertion and is relieved by nitroglycerin. The patient complains of nausea. The patient's chest pain is middle- or left-sided, is not well-localized and does not radiate to the arms/jaw/neck. The patient denies diaphoresis. The patient has smoked in the past 90 days. The patient has no history of stroke, has no history of peripheral artery disease, has no relevant family history of coronary artery disease (first degree relative at less than age 65) and does not have an elevated BMI (>=30).   Past Medical History:  Diagnosis Date  . Diabetes mellitus without complication ([place])   . Dyspnea   . Hypertension     Patient Active Problem List   Diagnosis Date Noted  . Chest pain 02/03/2020  . CAD (coronary artery disease) 01/31/2020  . Pain due to onychomycosis of toenails of both feet 01/30/2020  . Chest pain of uncertain etiology 21/19/4174  . Non-STEMI (non-ST elevated myocardial infarction) ([place]) 01/29/2020  . Essential hypertension 01/29/2020  . Hyperlipidemia 01/29/2020  . Acute diastolic CHF (congestive heart failure) ([place]) 01/06/2020  . CHF (congestive heart failure) ([place]) 01/06/2020  . Noncompliance with medication regimen 08/17/2018  . Noncompliance with diagnostic testing 08/17/2018  . Type 2 diabetes mellitus ([place]) 04/17/2008  . TOBACCO ABUSE 04/17/2008    Past Surgical History:  Procedure Laterality Date  . LEFT HEART CATH AND CORONARY ANGIOGRAPHY N/A 01/31/2020   Procedure: LEFT HEART CATH AND CORONARY ANGIOGRAPHY;  Surgeon: Lorretta Harp, MD;  Location: [facility];  Service: Cardiovascular;  Laterality: N/A;  . NO PAST SURGERIES       OB History   No obstetric history on file.     Family History  Problem Relation Age of Onset  . Diabetes Mother   . Diabetes Sister   . Diabetes Brother     Social History   Tobacco Use  . Smoking status:  Current Every Day Smoker    Packs/day: 0.50    Types: Cigarettes  . Smokeless tobacco: Never Used  Substance Use Topics  . Alcohol use: No  . Drug use: No    Home Medications Prior to Admission medications   Medication Sig Start Date End Date Taking? Authorizing Provider  amLODipine (NORVASC) 5 MG tablet Take 5 mg by mouth daily.    [provider]  aspirin EC 81 MG tablet Take 81 mg by mouth daily.    [provider]  atorvastatin (LIPITOR) 80 MG tablet Take 1 tablet (80 mg total) by mouth daily. 02/01/20   Bhagat, Crista Luria, PA  bismuth subsalicylate (PEPTO BISMOL) 262 MG/15ML suspension Take 30 mLs by mouth every 6 (six) hours as needed for indigestion.    [provider]  Blood Pressure Monitoring (ADULT BLOOD PRESSURE CUFF LG) KIT Use to check blood pressure twice a day, keep a running log of your blood pressures 01/31/15   Linton Flemings, MD  calcium carbonate (TUMS - DOSED IN MG ELEMENTAL CALCIUM) 500 MG chewable tablet Chew 1 tablet by mouth daily as needed for indigestion or heartburn.    [provider]  carvedilol (COREG) 25 MG tablet Take 1 tablet (25 mg total) by mouth 2 (two) times daily with a meal. 02/01/20   Bhagat, Bhavinkumar, PA  furosemide (LASIX) 20 MG tablet Take 20 mg by mouth daily.     [provider]  glucose blood (CONTOUR NEXT TEST) test strip Use as instructed 02/24/17   McVey, Gelene Mink, PA-C  glucose monitoring kit (FREESTYLE) monitoring kit 1 each by Does not apply route 2 (two) times daily. 01/18/14   Charlann Lange, PA-C  isosorbide mononitrate (IMDUR) 30 MG 24 hr tablet Take 1 tablet (30 mg total) by mouth daily. 02/01/20   Bhagat, Crista Luria, PA  losartan (COZAAR) 100 MG tablet Take 100 mg by mouth daily.  11/26/19   [provider]  metFORMIN (GLUCOPHAGE) 1000 MG tablet TAKE 1 TABLET(1000 MG) BY MOUTH TWICE DAILY Patient taking differently: Take 1,000 mg by mouth 2 (two) times daily with a meal.   08/27/19   Forrest Moron, MD  MICROLET LANCETS MISC USE TO TEST BLOOD SUGAR ONCE DAILY 02/25/17   McVey, Gelene Mink, PA-C  nitroGLYCERIN (NITROSTAT) 0.4 MG SL tablet Place 1 tablet (0.4 mg total) under the tongue every 5 (five) minutes as needed. 02/01/20   Leanor Kail, PA    Allergies    Tramadol  Review of Systems   Review of Systems  Constitutional: Negative for chills and fever.  Respiratory: Positive for shortness of breath.   Cardiovascular: Positive for chest pain and leg swelling.  Gastrointestinal: Positive for nausea. Negative for abdominal pain and vomiting.  All other systems reviewed and are negative.   Physical Exam Updated Vital Signs BP (!) 153/76   Pulse 65   Temp 98.2 F (36.8 C) (Oral)   Resp 16   Ht 5' 6" (1.676 m)   Wt 70.4 kg   SpO2 97%   BMI 25.05 kg/m   Physical Exam Vitals and nursing note reviewed.  Constitutional:  General: She is not in acute distress.    Appearance: She is well-developed.  HENT:     Head: Normocephalic and atraumatic.  Eyes:     General:        Right eye: No discharge.        Left eye: No discharge.     Conjunctiva/sclera: Conjunctivae normal.  Neck:     Vascular: No JVD.     Trachea: No tracheal deviation.  Cardiovascular:     Rate and Rhythm: Normal rate and regular rhythm.     Pulses:          Radial pulses are 2+ on the right side and 2+ on the left side.       Dorsalis pedis pulses are 2+ on the right side and 2+ on the left side.       Posterior tibial pulses are 2+ on the right side and 2+ on the left side.     Comments: 2+ pitting edema of the bilateral lower extremities, Homans' sign absent bilaterally Pulmonary:     Effort: Pulmonary effort is normal.     Breath sounds: Examination of the right-lower field reveals rales. Examination of the left-lower field reveals rales. Rales present.     Comments: Bibasilar crackles, L>R Chest:     Chest wall: No tenderness.  Abdominal:      General: There is no distension.     Palpations: Abdomen is soft.     Tenderness: There is no abdominal tenderness.  Musculoskeletal:     Cervical back: Normal range of motion and neck supple.     Right lower leg: No tenderness. Edema present.     Left lower leg: No tenderness. Edema present.  Skin:    General: Skin is warm and dry.     Findings: No erythema.  Neurological:     Mental Status: She is alert.  Psychiatric:        Behavior: Behavior normal.     ED Results / Procedures / Treatments   Labs (all labs ordered are listed, but only abnormal results are displayed) Labs Reviewed  BASIC METABOLIC PANEL - Abnormal; Notable for the following components:      Result Value   Potassium 3.3 (*)    Glucose, Bld 157 (*)    Calcium 8.6 (*)    All other components within normal limits  CBC - Abnormal; Notable for the following components:   Hemoglobin 11.7 (*)    All other components within normal limits  BRAIN NATRIURETIC PEPTIDE - Abnormal; Notable for the following components:   B Natriuretic Peptide 466.5 (*)    All other components within normal limits  CBC - Abnormal; Notable for the following components:   Hemoglobin 11.8 (*)    MCHC 29.8 (*)    All other components within normal limits  CBG MONITORING, ED - Abnormal; Notable for the following components:   Glucose-Capillary 105 (*)    All other components within normal limits  TROPONIN I (HIGH SENSITIVITY) - Abnormal; Notable for the following components:   Troponin I (High Sensitivity) 25 (*)    All other components within normal limits  TROPONIN I (HIGH SENSITIVITY) - Abnormal; Notable for the following components:   Troponin I (High Sensitivity) 28 (*)    All other components within normal limits  RESPIRATORY PANEL BY RT PCR (FLU A&B, COVID)  CREATININE, SERUM    EKG EKG Interpretation  Date/Time:  Saturday February 02 2020 15:44:13 EDT Ventricular Rate:  67 PR  Interval:  226 QRS Duration: 132 QT  Interval:  474 QTC Calculation: 500 R Axis:   -62 Text Interpretation: Sinus rhythm with 1st degree A-V block Right bundle branch block Left anterior fascicular block bifasicular block T wave abnormality, consider lateral ischemia Abnormal ECG No significant change since last tracing Confirmed by Wandra Arthurs [phone]) on 02/02/2020 10:39:48 PM   Radiology DG Chest 2 View  Result Date: 02/02/2020 CLINICAL DATA:  74 year old female with chest pain. EXAM: CHEST - 2 VIEW COMPARISON:  Chest radiograph dated 12/29/2019. FINDINGS: The lungs are clear. There is no pleural effusion pneumothorax. Mild cardiomegaly. Atherosclerotic calcification of the aortic arch. No acute osseous pathology. IMPRESSION: No active cardiopulmonary disease. Electronically Signed   By: Anner Crete M.D.   On: 02/02/2020 17:22    Procedures Procedures (including critical care time)  Medications Ordered in ED Medications  sodium chloride flush (NS) 0.9 % injection 3 mL (3 mLs Intravenous Not Given 02/02/20 2231)  nitroGLYCERIN 50 mg in dextrose 5 % 250 mL (0.2 mg/mL) infusion (25 mcg/min Intravenous Rate/Dose Change 02/03/20 0514)  nitroGLYCERIN (NITROSTAT) SL tablet 0.4 mg (has no administration in time range)  acetaminophen (TYLENOL) tablet 650 mg (has no administration in time range)  ondansetron (ZOFRAN) injection 4 mg (has no administration in time range)  enoxaparin (LOVENOX) injection 40 mg (has no administration in time range)  insulin aspart (novoLOG) injection 0-9 Units (has no administration in time range)  insulin aspart (novoLOG) injection 0-5 Units (has no administration in time range)    ED Course  I have reviewed the triage vital signs and the nursing notes.  Pertinent labs & imaging results that were available during my care of the patient were reviewed by me and considered in my medical decision making (see chart for details).    MDM Rules/Calculators/A&P HEAR Score: 6                    Patient  presenting for evaluation of substernal chest pain which improved with nitroglycerin.  She is afebrile, persistently hypertensive in the ED.  She is currently chest pain-free.  She had a recent cardiac catheterization which showed multivessel disease and a CABG was recommended though she refused this initially.  Her EKG shows no acute ischemic abnormalities though is abnormal at baseline.  Her chest pain resolved with sublingual nitroglycerin earlier today.   Chest x-ray shows no acute cardiopulmonary abnormalities.  Serial troponins are mildly elevated.  Will start on nitroglycerin drip and plan to admit to the hospitalist service for management of hypertension and chest pain which sounds concerning for unstable angina.  12:54 AM CONSULT: Spoke with Dr. Blossom Hoops with on-call cardiology service who request that the hospitalist consult cardiology if they feel cardiology consult will be necessary with patient's admission to the hospital.  Spoke with Dr. Humphrey Rolls with [facility] hospitalist service who agrees to assume care of patient and bring her into the hospital for further evaluation and management.  Patient was seen and evaluated by Dr. Darl Householder who agrees with assessment and plan at this time. Final Clinical Impression(s) / ED Diagnoses Final diagnoses:  Hypertensive urgency  Substernal chest pain relieved by nitroglycerin    Rx / DC Orders ED Discharge Orders    None         Debroah Baller 02/03/20 0746    Drenda Freeze, MD 02/03/20 [phone]

## 2020-02-02 NOTE — ED Triage Notes (Signed)
C/o intermittent pain to L chest since this morning with SOB.  Denies pain at present.

## 2020-02-03 DIAGNOSIS — E782 Mixed hyperlipidemia: Secondary | ICD-10-CM | POA: Diagnosis not present

## 2020-02-03 DIAGNOSIS — I160 Hypertensive urgency: Secondary | ICD-10-CM | POA: Diagnosis present

## 2020-02-03 DIAGNOSIS — E785 Hyperlipidemia, unspecified: Secondary | ICD-10-CM | POA: Diagnosis present

## 2020-02-03 DIAGNOSIS — J9811 Atelectasis: Secondary | ICD-10-CM | POA: Diagnosis not present

## 2020-02-03 DIAGNOSIS — I440 Atrioventricular block, first degree: Secondary | ICD-10-CM | POA: Diagnosis present

## 2020-02-03 DIAGNOSIS — J90 Pleural effusion, not elsewhere classified: Secondary | ICD-10-CM | POA: Diagnosis not present

## 2020-02-03 DIAGNOSIS — R0789 Other chest pain: Secondary | ICD-10-CM | POA: Diagnosis not present

## 2020-02-03 DIAGNOSIS — I25110 Atherosclerotic heart disease of native coronary artery with unstable angina pectoris: Secondary | ICD-10-CM | POA: Diagnosis present

## 2020-02-03 DIAGNOSIS — I071 Rheumatic tricuspid insufficiency: Secondary | ICD-10-CM | POA: Diagnosis not present

## 2020-02-03 DIAGNOSIS — I252 Old myocardial infarction: Secondary | ICD-10-CM | POA: Diagnosis not present

## 2020-02-03 DIAGNOSIS — Z7984 Long term (current) use of oral hypoglycemic drugs: Secondary | ICD-10-CM | POA: Diagnosis not present

## 2020-02-03 DIAGNOSIS — E1169 Type 2 diabetes mellitus with other specified complication: Secondary | ICD-10-CM

## 2020-02-03 DIAGNOSIS — Z951 Presence of aortocoronary bypass graft: Secondary | ICD-10-CM | POA: Diagnosis not present

## 2020-02-03 DIAGNOSIS — Z888 Allergy status to other drugs, medicaments and biological substances status: Secondary | ICD-10-CM | POA: Diagnosis not present

## 2020-02-03 DIAGNOSIS — I5031 Acute diastolic (congestive) heart failure: Secondary | ICD-10-CM | POA: Diagnosis present

## 2020-02-03 DIAGNOSIS — I25118 Atherosclerotic heart disease of native coronary artery with other forms of angina pectoris: Secondary | ICD-10-CM | POA: Diagnosis not present

## 2020-02-03 DIAGNOSIS — I200 Unstable angina: Secondary | ICD-10-CM | POA: Diagnosis not present

## 2020-02-03 DIAGNOSIS — I10 Essential (primary) hypertension: Secondary | ICD-10-CM | POA: Diagnosis not present

## 2020-02-03 DIAGNOSIS — Z7982 Long term (current) use of aspirin: Secondary | ICD-10-CM | POA: Diagnosis not present

## 2020-02-03 DIAGNOSIS — Z79899 Other long term (current) drug therapy: Secondary | ICD-10-CM | POA: Diagnosis not present

## 2020-02-03 DIAGNOSIS — E7800 Pure hypercholesterolemia, unspecified: Secondary | ICD-10-CM | POA: Diagnosis present

## 2020-02-03 DIAGNOSIS — R079 Chest pain, unspecified: Secondary | ICD-10-CM | POA: Diagnosis not present

## 2020-02-03 DIAGNOSIS — E876 Hypokalemia: Secondary | ICD-10-CM | POA: Diagnosis not present

## 2020-02-03 DIAGNOSIS — Z20822 Contact with and (suspected) exposure to covid-19: Secondary | ICD-10-CM | POA: Diagnosis present

## 2020-02-03 DIAGNOSIS — Z01810 Encounter for preprocedural cardiovascular examination: Secondary | ICD-10-CM | POA: Diagnosis not present

## 2020-02-03 DIAGNOSIS — R918 Other nonspecific abnormal finding of lung field: Secondary | ICD-10-CM | POA: Diagnosis not present

## 2020-02-03 DIAGNOSIS — Z885 Allergy status to narcotic agent status: Secondary | ICD-10-CM | POA: Diagnosis not present

## 2020-02-03 DIAGNOSIS — R072 Precordial pain: Secondary | ICD-10-CM | POA: Diagnosis not present

## 2020-02-03 DIAGNOSIS — D62 Acute posthemorrhagic anemia: Secondary | ICD-10-CM | POA: Diagnosis not present

## 2020-02-03 DIAGNOSIS — F17210 Nicotine dependence, cigarettes, uncomplicated: Secondary | ICD-10-CM | POA: Diagnosis present

## 2020-02-03 DIAGNOSIS — I110 Hypertensive heart disease with heart failure: Secondary | ICD-10-CM | POA: Diagnosis present

## 2020-02-03 DIAGNOSIS — E119 Type 2 diabetes mellitus without complications: Secondary | ICD-10-CM | POA: Diagnosis present

## 2020-02-03 LAB — CBG MONITORING, ED
Glucose-Capillary: 141 mg/dL — ABNORMAL HIGH (ref 70–99)
Glucose-Capillary: 216 mg/dL — ABNORMAL HIGH (ref 70–99)

## 2020-02-03 LAB — CBC
HCT: 39.6 % (ref 36.0–46.0)
Hemoglobin: 11.8 g/dL — ABNORMAL LOW (ref 12.0–15.0)
MCH: 26 pg (ref 26.0–34.0)
MCHC: 29.8 g/dL — ABNORMAL LOW (ref 30.0–36.0)
MCV: 87.2 fL (ref 80.0–100.0)
Platelets: 184 10*3/uL (ref 150–400)
RBC: 4.54 MIL/uL (ref 3.87–5.11)
RDW: 14.2 % (ref 11.5–15.5)
WBC: 6.2 10*3/uL (ref 4.0–10.5)
nRBC: 0 % (ref 0.0–0.2)

## 2020-02-03 LAB — GLUCOSE, CAPILLARY
Glucose-Capillary: 125 mg/dL — ABNORMAL HIGH (ref 70–99)
Glucose-Capillary: 256 mg/dL — ABNORMAL HIGH (ref 70–99)

## 2020-02-03 LAB — CREATININE, SERUM
Creatinine, Ser: 0.9 mg/dL (ref 0.44–1.00)
GFR calc Af Amer: >60 mL/min (ref >60)
GFR calc non Af Amer: >60 mL/min (ref >60)

## 2020-02-03 LAB — RESPIRATORY PANEL BY RT PCR (FLU A&B, COVID)
Influenza A by PCR: NEGATIVE
Influenza B by PCR: NEGATIVE
SARS Coronavirus 2 by RT PCR: NEGATIVE

## 2020-02-03 LAB — TROPONIN I (HIGH SENSITIVITY): Troponin I (High Sensitivity): 24 ng/L — ABNORMAL HIGH (ref <18)

## 2020-02-03 MED ORDER — AMLODIPINE BESYLATE 5 MG PO TABS
5.0000 mg | ORAL_TABLET | Freq: Every day | ORAL | Status: DC
  Administered 2020-02-03 – 2020-02-04 (×2): 5 mg via ORAL
  Filled 2020-02-03 (×2): qty 1

## 2020-02-03 MED ORDER — ONDANSETRON HCL 4 MG/2ML IJ SOLN
4.0000 mg | Freq: Four times a day (QID) | INTRAMUSCULAR | Status: DC | PRN
  Administered 2020-02-03: 4 mg via INTRAVENOUS
  Filled 2020-02-03: qty 2

## 2020-02-03 MED ORDER — ATORVASTATIN CALCIUM 80 MG PO TABS
80.0000 mg | ORAL_TABLET | Freq: Every day | ORAL | Status: DC
  Administered 2020-02-03 – 2020-02-15 (×12): 80 mg via ORAL
  Filled 2020-02-03 (×12): qty 1

## 2020-02-03 MED ORDER — NITROGLYCERIN 0.4 MG SL SUBL
0.4000 mg | SUBLINGUAL_TABLET | SUBLINGUAL | Status: DC | PRN
  Administered 2020-02-06: 0.4 mg via SUBLINGUAL
  Filled 2020-02-03 (×2): qty 1

## 2020-02-03 MED ORDER — LOSARTAN POTASSIUM 50 MG PO TABS
100.0000 mg | ORAL_TABLET | Freq: Every day | ORAL | Status: DC
  Administered 2020-02-03 – 2020-02-07 (×5): 100 mg via ORAL
  Filled 2020-02-03 (×5): qty 2

## 2020-02-03 MED ORDER — ENOXAPARIN SODIUM 40 MG/0.4ML ~~LOC~~ SOLN
40.0000 mg | Freq: Every day | SUBCUTANEOUS | Status: DC
  Administered 2020-02-03 – 2020-02-07 (×5): 40 mg via SUBCUTANEOUS
  Filled 2020-02-03 (×5): qty 0.4

## 2020-02-03 MED ORDER — ISOSORBIDE MONONITRATE ER 30 MG PO TB24
30.0000 mg | ORAL_TABLET | Freq: Every day | ORAL | Status: DC
  Administered 2020-02-03 – 2020-02-04 (×2): 30 mg via ORAL
  Filled 2020-02-03 (×2): qty 1

## 2020-02-03 MED ORDER — CARVEDILOL 25 MG PO TABS
25.0000 mg | ORAL_TABLET | Freq: Two times a day (BID) | ORAL | Status: DC
  Administered 2020-02-03 – 2020-02-07 (×9): 25 mg via ORAL
  Filled 2020-02-03 (×9): qty 1

## 2020-02-03 MED ORDER — ACETAMINOPHEN 325 MG PO TABS: 650.0000 mg | ORAL_TABLET | ORAL | Status: DC | PRN

## 2020-02-03 MED ORDER — ASPIRIN EC 81 MG PO TBEC
81.0000 mg | DELAYED_RELEASE_TABLET | Freq: Every day | ORAL | Status: DC
  Administered 2020-02-03 – 2020-02-07 (×5): 81 mg via ORAL
  Filled 2020-02-03 (×5): qty 1

## 2020-02-03 MED ORDER — NITROGLYCERIN 0.4 MG SL SUBL: 0.4000 mg | SUBLINGUAL_TABLET | SUBLINGUAL | Status: DC | PRN

## 2020-02-03 MED ORDER — INSULIN ASPART 100 UNIT/ML ~~LOC~~ SOLN
0.0000 [IU] | Freq: Three times a day (TID) | SUBCUTANEOUS | Status: DC
  Administered 2020-02-03: 3 [IU] via SUBCUTANEOUS
  Administered 2020-02-03 (×2): 1 [IU] via SUBCUTANEOUS
  Administered 2020-02-04: 2 [IU] via SUBCUTANEOUS
  Administered 2020-02-04: 3 [IU] via SUBCUTANEOUS
  Administered 2020-02-05: 1 [IU] via SUBCUTANEOUS
  Administered 2020-02-05: 2 [IU] via SUBCUTANEOUS
  Administered 2020-02-05: 1 [IU] via SUBCUTANEOUS
  Administered 2020-02-06 (×2): 2 [IU] via SUBCUTANEOUS
  Administered 2020-02-07: 1 [IU] via SUBCUTANEOUS
  Administered 2020-02-07: 2 [IU] via SUBCUTANEOUS
  Administered 2020-02-07: 1 [IU] via SUBCUTANEOUS

## 2020-02-03 MED ORDER — INSULIN ASPART 100 UNIT/ML ~~LOC~~ SOLN
0.0000 [IU] | Freq: Every day | SUBCUTANEOUS | Status: DC
  Administered 2020-02-03: 3 [IU] via SUBCUTANEOUS
  Administered 2020-02-04 – 2020-02-06 (×2): 2 [IU] via SUBCUTANEOUS

## 2020-02-03 NOTE — Progress Notes (Signed)
PROGRESS NOTE    Bianca [name]  WUJ:[id number] DOB: [birth year] DOA: 02/02/2020 PCP: Julian Hy, PA-C    Brief Narrative:  Patient was admitted to the hospital with the working diagnosis of chest pain, unstable angina.   74 year old female who presented with chest pain, she does have significant past medical history for hypertension, dyslipidemia, type 2 diabetes mellitus, tobacco abuse and coronary artery disease.  She reported sudden onset of substernal chest pain around 10 AM on the day of admission, sharp in nature, 7/10 in intensity, nonradiating, improved with sublingual nitroglycerin, and associated with dyspnea.  On his initial physical examination blood pressure 149/72, heart rate 73, respirate 16, temperature afebrile, oxygen saturation 96%, her lungs were clear to auscultation bilaterally, heart S1-S2 present and rhythmic, abdomen soft, no lower extremity edema. Sodium 142, potassium 3.3, chloride 106, bicarb 29, glucose 157, BUN 16, creatinine 0.81, troponin I 25, white count 6.2, hemoglobin 11.7, hematocrit 38.6, platelets 199.  SARS COVID-19 negative. EKG 67 bpm, left axis deviation, right bundle branch block, manually corrected QTC 463, sinus rhythm with a left atrial enlargement, no ST segment changes, negative T wave lead I aVL.    Assessment & Plan:   Principal Problem:   Unstable angina (HCC) Active Problems:   Type 2 diabetes mellitus (HCC)   Essential hypertension   Hyperlipidemia   CAD (coronary artery disease)   Hypokalemia   1. Chest pain, unstable angina. Patient with no current chest pain, EKG from today with resolved lateral I-AVL t wave inversions. Patient with severe multivessel disease. Troponin I 25 and 28.   Continue aggressive medical therapy with: Aspirin, carvedilol, losaratn, isosorbide and atorvastatin.  Continue to trend troponin. Will dc nitroglycerin drip as long there is no chest pain.   I spoke with patient at length about  coronary revascularization, her daughter at the bedside. She is open to talk to cardiology.   2. Uncontrolled T2DM (Hgb A1c 9,0). Will continue glucose cover and monitoring with insulin sliding scale.   3. HTN. Continue blood pressure control with amlodipine, losartan coreg and isosorbide.   4. Dyslipidemia. Continue with statin therapy   5. Hypokalemia. K down to 3,3 will correct with 40 Kcl and will follow renal panel in am, renal function stable with serum cr at 0.81.   DVT prophylaxis:  enoxaparin   Code Status:    full  Family Communication:  I spoke with patient's daugter at the bedside, we talked in detail about patient's condition, plan of care and prognosis and all questions were addressed.   Disposition Plan:   Patient is from:  Home   Anticipated DC to:  Home   Anticipated DC date:  To be determined   Anticipated DC barriers: Unstable angina, pending cardiac evaluation.      Consultants:   Cardiology    Subjective: Patient with no chest pain at the time of my examination, no nausea or vomiting, at home has very limited mobility since her discharge from the hospital   Objective: Vitals:   02/03/20 1145 02/03/20 1200 02/03/20 1215 02/03/20 1230  BP: (!) 165/83 (!) 172/86 (!) 176/91 (!) 163/82  Pulse: 76 73 74 64  Resp: 18 17 14  (!) 21  Temp:      TempSrc:      SpO2: 99% 97% 98% 97%  Weight:      Height:       No intake or output data in the 24 hours ending 02/03/20 1401 Filed Weights   02/02/20 2228  Weight: 70.4 kg    Examination:   General: Not in pain or dyspnea. Neurology: Awake and alert, non focal  E ENT: mild pallor, no icterus, oral mucosa moist Cardiovascular: No JVD. S1-S2 present, rhythmic, no gallops, rubs, or murmurs. No lower extremity edema. Pulmonary: positive breath sounds bilaterally, adequate air movement, no wheezing, rhonchi or rales. Gastrointestinal. Abdomen with no organomegaly, non tender, no rebound or guarding Skin. No  rashes Musculoskeletal: no joint deformities     Data Reviewed: I have personally reviewed following labs and imaging studies  CBC: Recent Labs  Lab 01/29/20 1230 02/02/20 1642 02/03/20 0406  WBC 6.1 6.2 6.2  HGB 12.7 11.7* 11.8*  HCT 39.8 38.6 39.6  MCV 85 88.9 87.2  PLT 209 199 184   Basic Metabolic Panel: Recent Labs  Lab 01/29/20 1230 02/02/20 1642 02/03/20 0406  NA 144 142  --   K 4.2 3.3*  --   CL 105 106  --   CO2 24 29  --   GLUCOSE 128* 157*  --   BUN 14 16  --   CREATININE 0.82 0.81 0.90  CALCIUM 9.1 8.6*  --    GFR: Estimated Creatinine Clearance: 51.3 mL/min (by C-G formula based on SCr of 0.9 mg/dL). Liver Function Tests: No results for input(s): AST, ALT, ALKPHOS, BILITOT, PROT, ALBUMIN in the last 168 hours. No results for input(s): LIPASE, AMYLASE in the last 168 hours. No results for input(s): AMMONIA in the last 168 hours. Coagulation Profile: No results for input(s): INR, PROTIME in the last 168 hours. Cardiac Enzymes: No results for input(s): CKTOTAL, CKMB, CKMBINDEX, TROPONINI in the last 168 hours. BNP (last 3 results) No results for input(s): PROBNP in the last 8760 hours. HbA1C: No results for input(s): HGBA1C in the last 72 hours. CBG: Recent Labs  Lab 02/01/20 0829 02/01/20 1216 02/02/20 2119 02/03/20 0825 02/03/20 1224  GLUCAP 219* 192* 105* 141* 216*   Lipid Profile: No results for input(s): CHOL, HDL, LDLCALC, TRIG, CHOLHDL, LDLDIRECT in the last 72 hours. Thyroid Function Tests: No results for input(s): TSH, T4TOTAL, FREET4, T3FREE, THYROIDAB in the last 72 hours. Anemia Panel: No results for input(s): VITAMINB12, FOLATE, FERRITIN, TIBC, IRON, RETICCTPCT in the last 72 hours.    Radiology Studies: I have reviewed all of the imaging during this hospital visit personally     Scheduled Meds: . enoxaparin (LOVENOX) injection  40 mg Subcutaneous Daily  . insulin aspart  0-5 Units Subcutaneous QHS  . insulin aspart   0-9 Units Subcutaneous TID WC  . sodium chloride flush  3 mL Intravenous Once   Continuous Infusions: . nitroGLYCERIN 25 mcg/min (02/03/20 0514)     LOS: 0 days        Vernella Niznik Annett Gula, MD

## 2020-02-03 NOTE — H&P (Signed)
History and Physical    Bianca [name]:[id number] DOB: 1946/02/15 DOA: 02/02/2020  PCP: Julian Hy, PA-C (Confirm with patient/family/NH records and if not entered, this has to be entered at [facility] point of entry) Patient coming from: Home  I have personally briefly reviewed patient's old medical records in [place]  Chief Complaint: Chest pain  HPI: Bianca [name] is a 74 y.o. female with medical history significant of pretension, hyperlipidemia, coronary artery disease, diabetes mellitus and tobacco abuse presented to ED for evaluation of chest pain she states that the chest pain started suddenly and substernal area around 10 AM in the morning after she ate her breakfast.  Pain was sharp in nature, 7 out of 10 on pain scale, nonradiating, improved with sublingual nitroglycerin but restarted again after few minutes.  Pain was associated with shortness of breath and nausea but there was no lightheadedness, diaphoresis and vomiting.  Patient otherwise denies fever, chills, cough, GERD, recent injury to the chest and recent travel history.  Patient had cardiac catheterization on January 31, 2020 which showed multivessel disease and she was recommended for CABG but she preferred PCI.  ED Course: On arrival to the ED patient had temperature of 98.2, blood pressure 157/91, heart rate 69 and respiratory rate 18.  Blood work showed WBC 6.2, hemoglobin 11.7, sodium 142, potassium 3.3, BUN 16, creatinine 0.81 and blood glucose 157.  Chest x-ray was negative for acute cardiopulmonary problem.  First set of troponin was 25 and the second set was 28.  ED physician contacted on-call cardiology service who recommended to admit the patient to hospital medicine and cardiology consult.  Patient is pain-free at this time.  Review of Systems: As per HPI otherwise 10 point review of systems negative.    Past Medical History:  Diagnosis Date  . Diabetes mellitus without complication ([place])   .  Dyspnea   . Hypertension     Past Surgical History:  Procedure Laterality Date  . LEFT HEART CATH AND CORONARY ANGIOGRAPHY N/A 01/31/2020   Procedure: LEFT HEART CATH AND CORONARY ANGIOGRAPHY;  Surgeon: Lorretta Harp, MD;  Location: [place] [place] [facility];  Service: Cardiovascular;  Laterality: N/A;  . NO PAST SURGERIES       reports that she has been smoking cigarettes. She has been smoking about 0.50 packs per day. She has never used smokeless tobacco. She reports that she does not drink alcohol or use drugs.  Allergies  Allergen Reactions  . Tramadol Nausea And Vomiting    Family History  Problem Relation Age of Onset  . Diabetes Mother   . Diabetes Sister   . Diabetes Brother      Prior to Admission medications   Medication Sig Start Date End Date Taking? Authorizing Provider  amLODipine (NORVASC) 5 MG tablet Take 5 mg by mouth daily.    [provider]  aspirin EC 81 MG tablet Take 81 mg by mouth daily.    [provider]  atorvastatin (LIPITOR) 80 MG tablet Take 1 tablet (80 mg total) by mouth daily. 02/01/20   Bhagat, Crista Luria, PA  bismuth subsalicylate (PEPTO BISMOL) 262 MG/15ML suspension Take 30 mLs by mouth every 6 (six) hours as needed for indigestion.    [provider]  Blood Pressure Monitoring (ADULT BLOOD PRESSURE CUFF LG) KIT Use to check blood pressure twice a day, keep a running log of your blood pressures 01/31/15   Linton Flemings, MD  calcium carbonate (TUMS - DOSED IN MG ELEMENTAL  CALCIUM) 500 MG chewable tablet Chew 1 tablet by mouth daily as needed for indigestion or heartburn.    [provider]  carvedilol (COREG) 25 MG tablet Take 1 tablet (25 mg total) by mouth 2 (two) times daily with a meal. 02/01/20   Bhagat, Bhavinkumar, PA  furosemide (LASIX) 20 MG tablet Take 20 mg by mouth daily.     [provider]  glucose blood (CONTOUR NEXT TEST) test strip Use as instructed 02/24/17   McVey, Gelene Mink, PA-C    glucose monitoring kit (FREESTYLE) monitoring kit 1 each by Does not apply route 2 (two) times daily. 01/18/14   Charlann Lange, PA-C  isosorbide mononitrate (IMDUR) 30 MG 24 hr tablet Take 1 tablet (30 mg total) by mouth daily. 02/01/20   Bhagat, Crista Luria, PA  losartan (COZAAR) 100 MG tablet Take 100 mg by mouth daily.  11/26/19   [provider]  metFORMIN (GLUCOPHAGE) 1000 MG tablet TAKE 1 TABLET(1000 MG) BY MOUTH TWICE DAILY Patient taking differently: Take 1,000 mg by mouth 2 (two) times daily with a meal.  08/27/19   Forrest Moron, MD  MICROLET LANCETS MISC USE TO TEST BLOOD SUGAR ONCE DAILY 02/25/17   McVey, Gelene Mink, PA-C  nitroGLYCERIN (NITROSTAT) 0.4 MG SL tablet Place 1 tablet (0.4 mg total) under the tongue every 5 (five) minutes as needed. 02/01/20   Leanor Kail, PA    Physical Exam: Vitals:   02/03/20 0430 02/03/20 0445 02/03/20 0500 02/03/20 0515  BP: (!) 149/72 (!) 141/72 (!) 150/75 (!) 156/79  Pulse: 73 72 71 77  Resp: 16 11 (!) 0 14  Temp:      TempSrc:      SpO2: 97% 96% 97% 97%  [name]:      Height:        Constitutional: NAD, calm, comfortable Vitals:   02/03/20 0430 02/03/20 0445 02/03/20 0500 02/03/20 0515  BP: (!) 149/72 (!) 141/72 (!) 150/75 (!) 156/79  Pulse: 73 72 71 77  Resp: 16 11 (!) 0 14  Temp:      TempSrc:      SpO2: 97% 96% 97% 97%  [name]:      Height:       Eyes: PERRL, lids and conjunctivae normal ENMT: Mucous membranes are moist. Posterior pharynx clear of any exudate or lesions.Normal dentition.  Neck: normal, supple, no masses, no thyromegaly Respiratory: clear to auscultation bilaterally, no wheezing, no crackles. Normal respiratory effort. No accessory muscle use.  Cardiovascular: Chest is nontender on palpation.  Regular rate and rhythm, no murmurs / rubs / gallops. No extremity edema. 2+ pedal pulses. No carotid bruits.  Abdomen: no tenderness, no masses palpated. No hepatosplenomegaly. Bowel sounds  positive.  Musculoskeletal: no clubbing / cyanosis. No joint deformity upper and lower extremities. Good ROM, no contractures. Normal muscle tone.  Skin: no rashes, lesions, ulcers. No induration Neurologic: CN 2-12 grossly intact. Sensation intact, DTR normal. Strength 5/5 in all 4.  Psychiatric: Normal judgment and insight. Alert and oriented x 3. Normal mood.     Labs on Admission: I have personally reviewed following labs and imaging studies  CBC: Recent Labs  Lab 01/29/20 1230 02/02/20 1642 02/03/20 0406  WBC 6.1 6.2 6.2  HGB 12.7 11.7* 11.8*  HCT 39.8 38.6 39.6  MCV 85 88.9 87.2  PLT 209 199 854   Basic Metabolic Panel: Recent Labs  Lab 01/29/20 1230 02/02/20 1642 02/03/20 0406  NA 144 142  --   K 4.2 3.3*  --  CL 105 106  --   CO2 24 29  --   GLUCOSE 128* 157*  --   BUN 14 16  --   CREATININE 0.82 0.81 0.90  CALCIUM 9.1 8.6*  --    GFR: Estimated Creatinine Clearance: 51.3 mL/min (by C-G formula based on SCr of 0.9 mg/dL). Liver Function Tests: No results for input(s): AST, ALT, ALKPHOS, BILITOT, PROT, ALBUMIN in the last 168 hours. No results for input(s): LIPASE, AMYLASE in the last 168 hours. No results for input(s): AMMONIA in the last 168 hours. Coagulation Profile: No results for input(s): INR, PROTIME in the last 168 hours. Cardiac Enzymes: No results for input(s): CKTOTAL, CKMB, CKMBINDEX, TROPONINI in the last 168 hours. BNP (last 3 results) No results for input(s): PROBNP in the last 8760 hours. HbA1C: No results for input(s): HGBA1C in the last 72 hours. CBG: Recent Labs  Lab 01/31/20 1655 01/31/20 2147 02/01/20 0829 02/01/20 1216 02/02/20 2119  GLUCAP 93 163* 219* 192* 105*   Lipid Profile: No results for input(s): CHOL, HDL, LDLCALC, TRIG, CHOLHDL, LDLDIRECT in the last 72 hours. Thyroid Function Tests: No results for input(s): TSH, T4TOTAL, FREET4, T3FREE, THYROIDAB in the last 72 hours. Anemia Panel: No results for input(s):  VITAMINB12, FOLATE, FERRITIN, TIBC, IRON, RETICCTPCT in the last 72 hours. Urine analysis:    Component Value Date/Time   COLORURINE YELLOW 02/04/2015 1350   APPEARANCEUR CLEAR 02/04/2015 1350   LABSPEC 1.018 02/04/2015 1350   PHURINE 6.0 02/04/2015 1350   GLUCOSEU NEGATIVE 02/04/2015 1350   HGBUR NEGATIVE 02/04/2015 1350   BILIRUBINUR neg 02/20/2015 1040   KETONESUR 15 (A) 02/04/2015 1350   PROTEINUR 30 02/20/2015 1040   PROTEINUR NEGATIVE 02/04/2015 1350   UROBILINOGEN 0.2 02/20/2015 1040   UROBILINOGEN 1.0 02/04/2015 1350   NITRITE neg 02/20/2015 1040   NITRITE NEGATIVE 02/04/2015 1350   LEUKOCYTESUR Negative 02/20/2015 1040    Radiological Exams on Admission: DG Chest 2 View  Result Date: 02/02/2020 CLINICAL DATA:  74 year old female with chest pain. EXAM: CHEST - 2 VIEW COMPARISON:  Chest radiograph dated 12/29/2019. FINDINGS: The lungs are clear. There is no pleural effusion pneumothorax. Mild cardiomegaly. Atherosclerotic calcification of the aortic arch. No acute osseous pathology. IMPRESSION: No active cardiopulmonary disease. Electronically Signed   By: Anner Crete M.D.   On: 02/02/2020 17:22      Assessment/Plan Principal Problem:   Chest pain Chest pain and shortness of breath is resolved but due to multiple risk factors and recent PCI, patient is admitted for further evaluation by cardiology. Continuous telemetry monitoring Nitro glycine sublingual as needed for chest pain. Oxygen saturation with nasal cannula as needed. N.p.o. after midnight.  Active Problems:   Type 2 diabetes mellitus (HCC) Low-dose sliding scale insulin ordered. Blood glucose monitoring and hypoglycemic protocol in place.  Treat chronic medical problems with home medications.  DVT prophylaxis: Lovenox Code Status: Full code Family Communication: Patient's daughter present at the bedside and plan is explained to her. Disposition Plan:  Consults called: Cardiology Admission  status: Observation/telemetry   Edmonia Lynch MD [facility] Pager [phone]   If 7PM-7AM, please contact night-coverage [email or web address] Password   02/03/2020, 6:37 AM

## 2020-02-04 ENCOUNTER — Encounter (HOSPITAL_COMMUNITY): Payer: Self-pay | Admitting: Internal Medicine

## 2020-02-04 DIAGNOSIS — E119 Type 2 diabetes mellitus without complications: Secondary | ICD-10-CM

## 2020-02-04 DIAGNOSIS — I200 Unstable angina: Secondary | ICD-10-CM

## 2020-02-04 DIAGNOSIS — I10 Essential (primary) hypertension: Secondary | ICD-10-CM | POA: Diagnosis not present

## 2020-02-04 DIAGNOSIS — E782 Mixed hyperlipidemia: Secondary | ICD-10-CM | POA: Diagnosis not present

## 2020-02-04 DIAGNOSIS — I25110 Atherosclerotic heart disease of native coronary artery with unstable angina pectoris: Secondary | ICD-10-CM

## 2020-02-04 DIAGNOSIS — I25118 Atherosclerotic heart disease of native coronary artery with other forms of angina pectoris: Secondary | ICD-10-CM | POA: Diagnosis not present

## 2020-02-04 LAB — BASIC METABOLIC PANEL
Anion gap: 4 — ABNORMAL LOW (ref 5–15)
BUN: 15 mg/dL (ref 8–23)
CO2: 30 mmol/L (ref 22–32)
Calcium: 8.4 mg/dL — ABNORMAL LOW (ref 8.9–10.3)
Chloride: 107 mmol/L (ref 98–111)
Creatinine, Ser: 1.02 mg/dL — ABNORMAL HIGH (ref 0.44–1.00)
GFR calc Af Amer: >60 mL/min (ref >60)
GFR calc non Af Amer: 54 mL/min — ABNORMAL LOW (ref >60)
Glucose, Bld: 140 mg/dL — ABNORMAL HIGH (ref 70–99)
Potassium: 3.4 mmol/L — ABNORMAL LOW (ref 3.5–5.1)
Sodium: 141 mmol/L (ref 135–145)

## 2020-02-04 LAB — GLUCOSE, CAPILLARY
Glucose-Capillary: 196 mg/dL — ABNORMAL HIGH (ref 70–99)
Glucose-Capillary: 130 mg/dL — ABNORMAL HIGH (ref 70–99)
Glucose-Capillary: 230 mg/dL — ABNORMAL HIGH (ref 70–99)

## 2020-02-04 MED ORDER — ALUM & MAG HYDROXIDE-SIMETH 200-200-20 MG/5ML PO SUSP
30.0000 mL | ORAL | Status: DC | PRN
  Administered 2020-02-04 – 2020-02-06 (×3): 30 mL via ORAL
  Filled 2020-02-04 (×3): qty 30

## 2020-02-04 MED ORDER — AMLODIPINE BESYLATE 5 MG PO TABS
5.0000 mg | ORAL_TABLET | Freq: Once | ORAL | Status: AC
  Administered 2020-02-04: 5 mg via ORAL
  Filled 2020-02-04: qty 1

## 2020-02-04 MED ORDER — POTASSIUM CHLORIDE CRYS ER 20 MEQ PO TBCR
40.0000 meq | EXTENDED_RELEASE_TABLET | Freq: Once | ORAL | Status: AC
  Administered 2020-02-04: 40 meq via ORAL
  Filled 2020-02-04: qty 2

## 2020-02-04 MED ORDER — ISOSORBIDE MONONITRATE ER 60 MG PO TB24
60.0000 mg | ORAL_TABLET | Freq: Every day | ORAL | Status: DC
  Administered 2020-02-05 – 2020-02-07 (×3): 60 mg via ORAL
  Filled 2020-02-04 (×3): qty 1

## 2020-02-04 MED ORDER — POLYETHYLENE GLYCOL 3350 17 G PO PACK
17.0000 g | PACK | Freq: Every day | ORAL | Status: DC
  Administered 2020-02-06 – 2020-02-07 (×2): 17 g via ORAL
  Filled 2020-02-04 (×3): qty 1

## 2020-02-04 MED ORDER — AMLODIPINE BESYLATE 10 MG PO TABS
10.0000 mg | ORAL_TABLET | Freq: Every day | ORAL | Status: DC
  Administered 2020-02-05 – 2020-02-07 (×3): 10 mg via ORAL
  Filled 2020-02-04 (×3): qty 1

## 2020-02-04 NOTE — Consult Note (Signed)
[address]       [place], [address]             [phone]        [name] [place] Medical Record #[id number] Date of Birth: 02/12/1946  Referring: No ref. provider found Primary Care: Julian Hy, PA-C Primary Cardiologist:Jonathan Gwenlyn Found, MD  Chief Complaint:    Chief Complaint  Patient presents with  . Chest Pain    History of Present Illness:     Bianca [name] is well-known to our service.  She was seen last week after undergoing an elective left heart cath and surgical revascularization was recommended.  After multiple discussion with her and her family she refused surgery and was discharged with outpatient follow-up.  While at hold she experienced some dyspnea and chest pain and changed her mind about undergoing surgery.  She currently is not experiencing any chest pain.     Past Medical History:  Diagnosis Date  . CAD (coronary artery disease) 01/31/2020   2 v dz  . Diabetes mellitus without complication ([place])   . Dyspnea   . Hypertension   . Hypertensive urgency 01/06/2020  . NSTEMI (non-ST elevated myocardial infarction) ([place]) 01/06/2020   in setting of HTN urgency and pulm edema    Past Surgical History:  Procedure Laterality Date  . LEFT HEART CATH AND CORONARY ANGIOGRAPHY N/A 01/31/2020   Procedure: LEFT HEART CATH AND CORONARY ANGIOGRAPHY;  Surgeon: Lorretta Harp, MD;  Location: [facility];  Service: Cardiovascular;  Laterality: N/A;  . NO PAST SURGERIES      Social History: Support: Lives with her son  Social History   Tobacco Use  Smoking Status Current Every Day Smoker  . Packs/day: 0.50  . Types: Cigarettes  Smokeless Tobacco Never Used    Social History   Substance and Sexual Activity  Alcohol Use No     Allergies  Allergen Reactions  . Tramadol Nausea And Vomiting      Current Facility-Administered Medications  Medication Dose Route Frequency Provider Last Rate Last Admin    . acetaminophen (TYLENOL) tablet 650 mg  650 mg Oral Q4H PRN Bunnie Pion Z, DO      . [START ON 02/05/2020] amLODipine (NORVASC) tablet 10 mg  10 mg Oral Daily Barrett, Rhonda G, PA-C      . amLODipine (NORVASC) tablet 5 mg  5 mg Oral Once Barrett, Rhonda G, PA-C      . aspirin EC tablet 81 mg  81 mg Oral Daily Tawni Millers, MD   81 mg at 02/04/20 0829  . atorvastatin (LIPITOR) tablet 80 mg  80 mg Oral Daily Arrien, Jimmy Picket, MD   80 mg at 02/04/20 0829  . carvedilol (COREG) tablet 25 mg  25 mg Oral BID WC Arrien, Jimmy Picket, MD   25 mg at 02/04/20 0829  . enoxaparin (LOVENOX) injection 40 mg  40 mg Subcutaneous Daily Bunnie Pion Z, DO   40 mg at 02/04/20 0831  . insulin aspart (novoLOG) injection 0-5 Units  0-5 Units Subcutaneous QHS Bunnie Pion Z, DO   3 Units at 02/03/20 2201  . insulin aspart (novoLOG) injection 0-9 Units  0-9 Units Subcutaneous TID WC Bunnie Pion Z, DO   2 Units at 02/04/20 0830  . [START ON 02/05/2020] isosorbide mononitrate (IMDUR) 24 hr tablet 60 mg  60 mg Oral Daily Barrett, Rhonda G, PA-C      . losartan (COZAAR) tablet  100 mg  100 mg Oral Daily Arrien, Jimmy Picket, MD   100 mg at 02/04/20 0829  . nitroGLYCERIN (NITROSTAT) SL tablet 0.4 mg  0.4 mg Sublingual Q5 min PRN Arrien, Jimmy Picket, MD      . ondansetron Presance [facility]) injection 4 mg  4 mg Intravenous Q6H PRN Bunnie Pion Z, DO   4 mg at 02/03/20 1226  . polyethylene glycol (MIRALAX / GLYCOLAX) packet 17 g  17 g Oral Daily Arrien, Jimmy Picket, MD      . potassium chloride SA (KLOR-CON) CR tablet 40 mEq  40 mEq Oral Once Arrien, Jimmy Picket, MD      . sodium chloride flush (NS) 0.9 % injection 3 mL  3 mL Intravenous Once Drenda Freeze, MD        Medications Prior to Admission  Medication Sig Dispense Refill Last Dose  . amLODipine (NORVASC) 5 MG tablet Take 5 mg by mouth daily.   02/02/2020 at Unknown time  . aspirin EC 81 MG tablet Take 81 mg by mouth daily.    02/02/2020 at Unknown time  . atorvastatin (LIPITOR) 80 MG tablet Take 1 tablet (80 mg total) by mouth daily. 30 tablet 6 02/02/2020 at Unknown time  . bismuth subsalicylate (PEPTO BISMOL) 262 MG/15ML suspension Take 30 mLs by mouth every 6 (six) hours as needed for indigestion.   unk  . calcium carbonate (TUMS - DOSED IN MG ELEMENTAL CALCIUM) 500 MG chewable tablet Chew 1 tablet by mouth daily as needed for indigestion or heartburn.   unk  . carvedilol (COREG) 25 MG tablet Take 1 tablet (25 mg total) by mouth 2 (two) times daily with a meal. 60 tablet 6 02/02/2020 at 01000  . furosemide (LASIX) 20 MG tablet Take 20 mg by mouth daily.    01/30/2020  . isosorbide mononitrate (IMDUR) 30 MG 24 hr tablet Take 1 tablet (30 mg total) by mouth daily. 30 tablet 6 02/02/2020 at Unknown time  . losartan (COZAAR) 100 MG tablet Take 100 mg by mouth daily.    02/02/2020 at Unknown time  . metFORMIN (GLUCOPHAGE) 1000 MG tablet TAKE 1 TABLET(1000 MG) BY MOUTH TWICE DAILY (Patient taking differently: Take 1,000 mg by mouth 2 (two) times daily with a meal. ) 60 tablet 0 01/30/2020  . nitroGLYCERIN (NITROSTAT) 0.4 MG SL tablet Place 1 tablet (0.4 mg total) under the tongue every 5 (five) minutes as needed. 25 tablet 12 02/02/2020 at Unknown time  . Blood Pressure Monitoring (ADULT BLOOD PRESSURE CUFF LG) KIT Use to check blood pressure twice a day, keep a running log of your blood pressures 1 each 0   . glucose blood (CONTOUR NEXT TEST) test strip Use as instructed 100 each 12   . glucose monitoring kit (FREESTYLE) monitoring kit 1 each by Does not apply route 2 (two) times daily. 1 each 0   . MICROLET LANCETS MISC USE TO TEST BLOOD SUGAR ONCE DAILY 300 each 0     Family History  Problem Relation Age of Onset  . Diabetes Mother   . Diabetes Sister   . Diabetes Brother   . Heart disease Brother      Review of Systems:   Review of Systems  Constitutional: Negative.   Respiratory: Positive for shortness of breath.     Cardiovascular: Positive for chest pain.  Psychiatric/Behavioral: The patient is nervous/anxious.       Physical Exam: BP 131/63 (BP Location: Right Arm)   Pulse 73   Temp 98.2  F (36.8 C) (Oral)   Resp 16   Ht '5\' 6"'$  (1.676 m)   Wt 70.4 kg   SpO2 97%   BMI 25.03 kg/m  Physical Exam  Constitutional: She is oriented to person, place, and time. No distress.  HENT:  Head: Normocephalic and atraumatic.  Mouth/Throat: No oropharyngeal exudate.  Eyes: Conjunctivae are normal.  Neck: No tracheal deviation present.  Cardiovascular: Normal rate and regular rhythm.  Pulmonary/Chest: Effort normal. No respiratory distress.  Abdominal: She exhibits no distension.  Musculoskeletal:        General: No edema. Normal range of motion.     Cervical back: Normal range of motion.  Neurological: She is alert and oriented to person, place, and time.  Skin: Skin is warm and dry. She is not diaphoretic.         I have independently reviewed the above radiologic studies and discussed with the patient   Recent Lab Findings: Lab Results  Component Value Date   WBC 6.2 02/03/2020   HGB 11.8 (L) 02/03/2020   HCT 39.6 02/03/2020   PLT 184 02/03/2020   GLUCOSE 140 (H) 02/04/2020   CHOL 230 (H) 01/06/2020   TRIG 103 01/06/2020   HDL 57 01/06/2020   LDLCALC 152 (H) 01/06/2020   ALT 7 02/24/2017   AST 12 02/24/2017   NA 141 02/04/2020   K 3.4 (L) 02/04/2020   CL 107 02/04/2020   CREATININE 1.02 (H) 02/04/2020   BUN 15 02/04/2020   CO2 30 02/04/2020   TSH 1.037 01/06/2020   INR 1.03 04/24/2016   HGBA1C 9.0 (H) 01/06/2020      Assessment / Plan:   74 year old female with unstable angina severe three-vessel pulmonary disease.  She has a history of diabetes with a hemoglobin A1c.  Given the amount of disease surgery was recommended we will plan for revascularization later this week.     I  spent 30 minutes counseling the patient face to face.   Lajuana Matte 02/04/2020 4:04  PM

## 2020-02-04 NOTE — Consult Note (Addendum)
Cardiology Consultation:   Patient ID: Bianca [name]; [id number]; [birth year]   Admit date: 02/02/2020 Date of Consult: 02/04/2020  Primary Care Provider: Julian Hy, PA-C Primary Cardiologist: Quay Burow, MD 01/29/2020 Primary Electrophysiologist:  None   Patient Profile:   Bianca [name] is a 74 y.o. female with a hx of hypertensive urgency and acute pulmonary edemawith NSTEMI 12/2019, HTN, HLD, DM, tobacco abuse, CAD, RBBB, who is being seen today for the evaluation of USAP at the request of Dr Cathlean Sauer.  History of Present Illness:   Bianca [name] was admitted 03/28-03/29/2021 with HTN urgency, NSTEMI by ez, refused cath and left AMA.  Dr Gwenlyn Found saw in the office 01/29/2020, referred by Dr Helyn App planned as oupt. Pt admitted 04/22-04/23/2021 for cath. 2v dz, anatomy suitable for CABG, but pt refused.   Pt admitted 04/24 w/ chest pain, cards asked to see.   Bianca [name] has had several kinds of discomfort in her chest and back.  She initially went to her PCP for mid scapular pain.  She describes the pain is sharp.  It is not clearly exertional.  She also has had some cramping in her upper left chest, she had an episode of that today.  She does not think she has ever had that pain before.  She has also had some discomfort in her chest that she thought was indigestion.  She had the Covid vaccine a few days before she was admitted with hypertensive urgency, pulmonary edema and non-STEMI.  She had some fears about the bypass surgery so initially said she did not want it.  However, after she went home, she talked to several people that had had the bypass surgery and they were all doing well.  Her daughter also wants her to have a bypass surgery.  She has finally decided to have it.  She is still scared about the whole situation, but wants to get better.  She has not resumed smoking since she was hospitalized with pulmonary edema.  She does not want to be on a  lot of pills, will need an explanation for anything we want her to take at discharge.    Past Medical History:  Diagnosis Date  . CAD (coronary artery disease) 01/31/2020   2 v dz  . Diabetes mellitus without complication ([place])   . Dyspnea   . Hypertension   . Hypertensive urgency 01/06/2020  . NSTEMI (non-ST elevated myocardial infarction) ([place]) 01/06/2020   in setting of HTN urgency and pulm edema    Past Surgical History:  Procedure Laterality Date  . LEFT HEART CATH AND CORONARY ANGIOGRAPHY N/A 01/31/2020   Procedure: LEFT HEART CATH AND CORONARY ANGIOGRAPHY;  Surgeon: Lorretta Harp, MD;  Location: [facility];  Service: Cardiovascular;  Laterality: N/A;  . NO PAST SURGERIES       Prior to Admission medications   Medication Sig Start Date End Date Taking? Authorizing Provider  amLODipine (NORVASC) 5 MG tablet Take 5 mg by mouth daily.   Yes [provider]  aspirin EC 81 MG tablet Take 81 mg by mouth daily.   Yes [provider]  atorvastatin (LIPITOR) 80 MG tablet Take 1 tablet (80 mg total) by mouth daily. 02/01/20  Yes Bhagat, Bhavinkumar, PA  bismuth subsalicylate (PEPTO BISMOL) 262 MG/15ML suspension Take 30 mLs by mouth every 6 (six) hours as needed for indigestion.   Yes [provider]  calcium carbonate (TUMS - DOSED IN MG ELEMENTAL CALCIUM) 500 MG chewable tablet  Chew 1 tablet by mouth daily as needed for indigestion or heartburn.   Yes [provider]  carvedilol (COREG) 25 MG tablet Take 1 tablet (25 mg total) by mouth 2 (two) times daily with a meal. 02/01/20  Yes Bhagat, Bhavinkumar, PA  furosemide (LASIX) 20 MG tablet Take 20 mg by mouth daily.    Yes [provider]  isosorbide mononitrate (IMDUR) 30 MG 24 hr tablet Take 1 tablet (30 mg total) by mouth daily. 02/01/20  Yes Bhagat, Bhavinkumar, PA  losartan (COZAAR) 100 MG tablet Take 100 mg by mouth daily.  11/26/19  Yes [provider]  metFORMIN  (GLUCOPHAGE) 1000 MG tablet TAKE 1 TABLET(1000 MG) BY MOUTH TWICE DAILY Patient taking differently: Take 1,000 mg by mouth 2 (two) times daily with a meal.  08/27/19  Yes Stallings, Zoe A, MD  nitroGLYCERIN (NITROSTAT) 0.4 MG SL tablet Place 1 tablet (0.4 mg total) under the tongue every 5 (five) minutes as needed. 02/01/20  Yes Bhagat, Bhavinkumar, PA  Blood Pressure Monitoring (ADULT BLOOD PRESSURE CUFF LG) KIT Use to check blood pressure twice a day, keep a running log of your blood pressures 01/31/15   Linton Flemings, MD  glucose blood (CONTOUR NEXT TEST) test strip Use as instructed 02/24/17   McVey, Gelene Mink, PA-C  glucose monitoring kit (FREESTYLE) monitoring kit 1 each by Does not apply route 2 (two) times daily. 01/18/14   Charlann Lange, PA-C  MICROLET LANCETS MISC USE TO TEST BLOOD SUGAR ONCE DAILY 02/25/17   McVey, Gelene Mink, PA-C    Inpatient Medications: Scheduled Meds: . amLODipine  5 mg Oral Daily  . aspirin EC  81 mg Oral Daily  . atorvastatin  80 mg Oral Daily  . carvedilol  25 mg Oral BID WC  . enoxaparin (LOVENOX) injection  40 mg Subcutaneous Daily  . insulin aspart  0-5 Units Subcutaneous QHS  . insulin aspart  0-9 Units Subcutaneous TID WC  . isosorbide mononitrate  30 mg Oral Daily  . losartan  100 mg Oral Daily  . sodium chloride flush  3 mL Intravenous Once   Continuous Infusions:  PRN Meds: acetaminophen, nitroGLYCERIN, ondansetron (ZOFRAN) IV  Allergies:    Allergies  Allergen Reactions  . Tramadol Nausea And Vomiting    Social History:   Social History   Socioeconomic History  . Marital status: Divorced    Spouse name: Not on file  . Number of children: Not on file  . Years of education: Not on file  . Highest education level: Not on file  Occupational History  . Not on file  Tobacco Use  . Smoking status: Current Every Day Smoker    Packs/day: 0.50    Types: Cigarettes  . Smokeless tobacco: Never Used  Substance and Sexual  Activity  . Alcohol use: No  . Drug use: No  . Sexual activity: Not on file  Other Topics Concern  . Not on file  Social History Narrative  . Not on file   Social Determinants of Health   Financial Resource Strain:   . Difficulty of Paying Living Expenses:   Food Insecurity:   . Worried About Charity fundraiser in the Last Year:   . Arboriculturist in the Last Year:   Transportation Needs:   . Film/video editor (Medical):   Marland Kitchen Lack of Transportation (Non-Medical):   Physical Activity:   . Days of Exercise per Week:   . Minutes of Exercise per Session:  Stress:   . Feeling of Stress :   Social Connections:   . Frequency of Communication with Friends and Family:   . Frequency of Social Gatherings with Friends and Family:   . Attends Religious Services:   . Active Member of Clubs or Organizations:   . Attends Archivist Meetings:   Marland Kitchen Marital Status:   Intimate Partner Violence:   . Fear of Current or Ex-Partner:   . Emotionally Abused:   Marland Kitchen Physically Abused:   . Sexually Abused:     Family History:   Family History  Problem Relation Age of Onset  . Diabetes Mother   . Diabetes Sister   . Diabetes Brother   . Heart disease Brother    Family Status:  Family Status  Relation Name Status  . Mother  Deceased  . Sister  Alive  . Brother  Alive  . Father  Deceased    ROS:  Please see the history of present illness.  All other ROS reviewed and negative.     Physical Exam/Data:   Vitals:   02/03/20 2130 02/03/20 2356 02/04/20 0600 02/04/20 0828  BP: (!) 130/58 (!) 124/59 (!) 152/75 (!) 158/74  Pulse: 64 69 73   Resp: _0 Temp: 98.3 F (36.8 C) 98.4 F (36.9 C) 98.3 F (36.8 C)   TempSrc: Oral Oral Oral   SpO2: 98% 98% 97%   Weight:   70.4 kg   Height:        Intake/Output Summary (Last 24 hours) at 02/04/2020 1300 Last data filed at 02/03/2020 2030 Gross per 24 hour  Intake 360 ml  Output --  Net 360 ml    Last 3 Weights  02/04/2020 02/03/2020 02/02/2020  Weight (lbs) 155 lb 1.6 oz 154 lb 3.2 oz 155 lb 3.2 oz  Weight (kg) 70.353 kg 69.945 kg 70.398 kg     Body mass index is 25.03 kg/m.   General:  Well nourished, well developed, female in no acute distress HEENT: normal Lymph: no adenopathy Neck: JVD -not elevated Endocrine:  No thryomegaly Vascular: No carotid bruits; 4/4 extremity pulses 2+  Cardiac:  normal S1, S2; RRR; no murmur Lungs:  clear bilaterally, no wheezing, rhonchi or rales  Abd: soft, nontender, no hepatomegaly  Ext: no edema Musculoskeletal:  No deformities, BUE and BLE strength normal and equal Skin: warm and dry  Neuro:  CNs 2-12 intact, no focal abnormalities noted Psych:  Normal affect   EKG:  The EKG was personally reviewed and demonstrates:  SR, HR 73, RBBB is old Telemetry:  Telemetry was personally reviewed and demonstrates:  SR   CV studies:   ECHO: 01/06/2020 1. Left ventricular ejection fraction, by estimation, is 50 to 55%. The  left ventricle has low normal function. The left ventricle has no regional  wall motion abnormalities. There is mild concentric left ventricular  hypertrophy. Indeterminate diastolic filling due to E-A fusion.  2. Right ventricular systolic function is normal. The right ventricular  size is normal. Tricuspid regurgitation signal is inadequate for assessing  PA pressure.  3. The mitral valve is grossly normal. Trivial mitral valve  regurgitation.  4. The aortic valve is tricuspid. Aortic valve regurgitation is not  visualized. No aortic stenosis is present.  5. The inferior vena cava is normal in size with greater than 50%  respiratory variability, suggesting right atrial pressure of 3 mmHg.   CATH: 01/31/2020 Diagnostic Dominance: Left  IMPRESSION: Bianca [name] has a left dominant  system with high-grade disease in her proximal LAD, first and second diagonal branches, ramus branch, circumflex obtuse marginal branch and distal  circumflex.  She has low normal LV function by recent 2D echo.  Her anatomy is suitable for CABG.  Sheath was removed and a TR band was placed on the right wrist to achieve patent hemostasis.  The patient left lab in stable condition.  T CTS has been consulted.   Laboratory Data:   Chemistry Recent Labs  Lab 01/29/20 1230 01/29/20 1230 02/02/20 1642 02/03/20 0406 02/04/20 0336  NA 144  --  142  --  141  K 4.2  --  3.3*  --  3.4*  CL 105  --  106  --  107  CO2 24  --  29  --  30  GLUCOSE 128*  --  157*  --  140*  BUN 14  --  16  --  15  CREATININE 0.82   < > 0.81 0.90 1.02*  CALCIUM 9.1  --  8.6*  --  8.4*  GFRNONAA 71   < > >60 >60 54*  GFRAA 82   < > >60 >60 >60  ANIONGAP  --   --  7  --  4*   < > = values in this interval not displayed.    Lab Results  Component Value Date   ALT 7 02/24/2017   AST 12 02/24/2017   ALKPHOS 139 (H) 02/24/2017   BILITOT 0.3 02/24/2017   Hematology Recent Labs  Lab 01/29/20 1230 02/02/20 1642 02/03/20 0406  WBC 6.1 6.2 6.2  RBC 4.68 4.34 4.54  HGB 12.7 11.7* 11.8*  HCT 39.8 38.6 39.6  MCV 85 88.9 87.2  MCH 27.1 27.0 26.0  MCHC 31.9 30.3 29.8*  RDW 13.6 14.1 14.2  PLT 209 199 184   Cardiac Enzymes High Sensitivity Troponin:   Recent Labs  Lab 01/07/20 0610 01/07/20 0956 02/02/20 1642 02/02/20 2235 02/03/20 1712  TROPONINIHS 1,532* 1,582* 25* 28* 24*      BNP Recent Labs  Lab 02/02/20 1642  BNP 466.5*   TSH:  Lab Results  Component Value Date   TSH 1.037 01/06/2020   Lipids: Lab Results  Component Value Date   CHOL 230 (H) 01/06/2020   HDL 57 01/06/2020   LDLCALC 152 (H) 01/06/2020   TRIG 103 01/06/2020   CHOLHDL 4.0 01/06/2020   HgbA1c: Lab Results  Component Value Date   HGBA1C 9.0 (H) 01/06/2020   Magnesium: No results found for: MG   Radiology/Studies:  DG Chest 2 View  Result Date: 02/02/2020 CLINICAL DATA:  74 year old female with chest pain. EXAM: CHEST - 2 VIEW COMPARISON:  Chest  radiograph dated 12/29/2019. FINDINGS: The lungs are clear. There is no pleural effusion pneumothorax. Mild cardiomegaly. Atherosclerotic calcification of the aortic arch. No acute osseous pathology. IMPRESSION: No active cardiopulmonary disease. Electronically Signed   By: Anner Crete M.D.   On: 02/02/2020 17:22   CARDIAC CATHETERIZATION  Result Date: 01/31/2020  Ost LM lesion is 40% stenosed.  Ramus lesion is 90% stenosed.  LPAV lesion is 90% stenosed.  2nd Mrg-1 lesion is 95% stenosed.  2nd Mrg-2 lesion is 90% stenosed.  1st Diag lesion is 90% stenosed.  Dist LAD lesion is 90% stenosed.  2nd Diag lesion is 90% stenosed.  Prox LAD lesion is 60% stenosed.  Mid LAD lesion is 90% stenosed.  Bianca [name] is a 74 y.o. female  [id number] LOCATION:  FACILITY: [facility] PHYSICIAN: Quay Burow,  M.D. 02-Aug-1946 DATE OF PROCEDURE:  01/31/2020 DATE OF DISCHARGE: CARDIAC CATHETERIZATION History obtained from chart review.Bianca [name] is a 74 y.o. thin appearing divorced/widowed African-American female mother of 2 children, grandmother of 1 grandchild who is accompanied by her daughter [name].  She is referred by Dr. Mardene Speak, cardiologist, for diagnostic coronary angiography.  She has a history of greater than 100 pack years of tobacco abuse having quit recently during hospitalization.  She has treated hypertension, diabetes and hyperlipidemia as well as family history with 2 brothers who have had stents.  She was admitted to [facility] last month with hypertensive urgency and pulmonary edema.  She was placed on a nitro drip and diuresed.  2D echo revealed no low normal LV function with normal valvular function.  She did leave AMA.  She has been having atypical left lateral chest and back pain.  Her echo was reviewed by her cardiologist who thought that she had subtle inferior hypokinesia and given her symptoms and risk factors recommended that she undergo diagnostic coronary angiography.    Ms. Macari has a left dominant system with high-grade disease in her proximal LAD, first and second diagonal branches, ramus branch, circumflex obtuse marginal branch and distal circumflex.  She has low normal LV function by recent 2D echo.  Her anatomy is suitable for CABG.  Sheath was removed and a TR band was placed on the right wrist to achieve patent hemostasis.  The patient left lab in stable condition.  T CTS has been consulted. Quay Burow. MD, [facility] 01/31/2020 4:51 PM    Assessment and Plan:   1. USAP - minimal elevation in troponin, do not think acute vessel closure, do not think repeat cath needed - pt has agreed to have CABG, will contact the surgeons and let them know.  - continue current rx, since she had CP but will increase Imdur  2. HTN - On amlodipine 5 mg w/ SBP 150s>>increase to 10 mg qd - Continue Coreg 25 mg bid and losartan 100 mg qd - compliance w/ be better w/ fewer meds  3. HLD - goal LDL < 70 - continue Lipitor 80 mg, recheck in 6-12 weeks  Otherwise, per IM Principal Problem:   Unstable angina (HCC) Active Problems:   Type 2 diabetes mellitus ([place])   Essential hypertension   Hyperlipidemia   CAD (coronary artery disease)   Hypokalemia     For questions or updates, please contact [facility] Please consult [email or web address] for contact info under Cardiology/STEMI.   Signed, Rosaria Ferries, PA-C  02/04/2020 1:00 PM   Attending Note:   The patient was seen and examined.  Agree with assessment and plan as noted above.  Changes made to the above note as needed.  Patient seen and independently examined with Rosaria Ferries, PA .   We discussed all aspects of the encounter. I agree with the assessment and plan as stated above.  1.  Coronary artery disease: The patient presents with further episodes of unstable angina.  She was recently admitted with non-ST segment elevation myocardial infarction.  Heart catheterization revealed three-vessel  disease.  She was seen by CV surgery and bypass surgery was recommended.  The patient refused to have bypass surgery and left the hospital.  She was readmitted the following day with recurrent episodes of chest pain.  She now agrees to have coronary artery bypass grafting.  I reviewed the angiograms.  I agree that bypass grafting is her best option.  We will notify  Dr. Kipp Brood.   I have spent a total of 40 minutes with patient reviewing hospital  notes , telemetry, EKGs, labs and examining patient as well as establishing an assessment and plan that was discussed with the patient. > 50% of time was spent in direct patient care.    Thayer Headings, Brooke Bonito., MD, [facility] 02/04/2020, 2:54 PM [address],  [place] Pager [phone]

## 2020-02-04 NOTE — Progress Notes (Signed)
PROGRESS NOTE    Bianca [name]  LTJ:[id number] DOB: 1946/02/05 DOA: 02/02/2020 PCP: Luciano Cutter, PA-C    Brief Narrative:  Patient was admitted to the hospital with the working diagnosis of chest pain, unstable angina.   74 year old female who presented with chest pain, she does have significant past medical history for hypertension, dyslipidemia, type 2 diabetes mellitus, tobacco abuse and coronary artery disease.  She reported sudden onset of substernal chest pain around 10 AM on the day of admission, sharp in nature, 7/10 in intensity, nonradiating, improved with sublingual nitroglycerin, and associated with dyspnea.  On his initial physical examination blood pressure 149/72, heart rate 73, respirate 16, temperature afebrile, oxygen saturation 96%, her lungs were clear to auscultation bilaterally, heart S1-S2 present and rhythmic, abdomen soft, no lower extremity edema. Sodium 142, potassium 3.3, chloride 106, bicarb 29, glucose 157, BUN 16, creatinine 0.81, troponin I 25, white count 6.2, hemoglobin 11.7, hematocrit 38.6, platelets 199.  SARS COVID-19 negative. EKG 67 bpm, left axis deviation, right bundle branch block, manually corrected QTC 463, sinus rhythm with a left atrial enlargement, no ST segment changes, NEW negative T wave lead I aVL.   Assessment & Plan:   Principal Problem:   Unstable angina (HCC) Active Problems:   Type 2 diabetes mellitus (HCC)   Essential hypertension   Hyperlipidemia   CAD (coronary artery disease)   Hypokalemia     1. Chest pain, unstable angina. Follow up EKG  with resolved lateral I-AVL t wave inversions. No further chest pain. Troponin I stable 25-28-24.   Aggressive medical therapy with: Aspirin, carvedilol, losaratn, isosorbide and atorvastatin.  Patient is open for the possibility of CABG, pending cardiology evaluation.   2. Uncontrolled T2DM (Hgb A1c 9,0). Fasting glucose is 140, will continue glucose cover and  monitoring with insulin sliding scale.   3. HTN. On amlodipine, losartan coreg and isosorbide. Blood pressure 158/74 mmHg.   4. Dyslipidemia. On statin therapy   5. Hypokalemia. Continue k correction with Kcl, will add 40 po Kcl today and follow renal panel in am. Serum cr stable at 1,0.   DVT prophylaxis:       enoxaparin   Code Status:               full  Family Communication:       I spoke with patient's daugter at the bedside, we talked in detail about patient's condition, plan of care and prognosis and all questions were addressed.   Disposition Plan:              Patient is from:                        Home              Anticipated DC to:                   Home              Anticipated DC date:               To be determined              Anticipated DC barriers:         Unstable angina, pending cardiac evaluation.          Consultants:   Cardiology     Subjective: Patient with no further chest pain, no nausea or vomiting, no dyspnea,   Objective: Vitals:  02/03/20 2130 02/03/20 2356 02/04/20 0600 02/04/20 0828  BP: (!) 130/58 (!) 124/59 (!) 152/75 (!) 158/74  Pulse: 64 69 73   Resp: 20 18 18    Temp: 98.3 F (36.8 C) 98.4 F (36.9 C) 98.3 F (36.8 C)   TempSrc: Oral Oral Oral   SpO2: 98% 98% 97%   Weight:   70.4 kg   Height:        Intake/Output Summary (Last 24 hours) at 02/04/2020 1109 Last data filed at 02/03/2020 2030 Gross per 24 hour  Intake 360 ml  Output --  Net 360 ml   Filed Weights   02/02/20 2228 02/03/20 1711 02/04/20 0600  Weight: 70.4 kg 69.9 kg 70.4 kg    Examination:   General: Not in pain or dyspnea, Neurology: Awake and alert, non focal  E ENT: mild pallor, no icterus, oral mucosa moist Cardiovascular: No JVD. S1-S2 present, rhythmic, no gallops, rubs, or murmurs. No lower extremity edema. Pulmonary: vesicular breath sounds bilaterally, adequate air movement, no wheezing, rhonchi or rales. Gastrointestinal. Abdomen with no  organomegaly, non tender, no rebound or guarding Skin. No rashes Musculoskeletal: no joint deformities     Data Reviewed: I have personally reviewed following labs and imaging studies  CBC: Recent Labs  Lab 01/29/20 1230 02/02/20 1642 02/03/20 0406  WBC 6.1 6.2 6.2  HGB 12.7 11.7* 11.8*  HCT 39.8 38.6 39.6  MCV 85 88.9 87.2  PLT 209 199 756   Basic Metabolic Panel: Recent Labs  Lab 01/29/20 1230 02/02/20 1642 02/03/20 0406 02/04/20 0336  NA 144 142  --  141  K 4.2 3.3*  --  3.4*  CL 105 106  --  107  CO2 24 29  --  30  GLUCOSE 128* 157*  --  140*  BUN 14 16  --  15  CREATININE 0.82 0.81 0.90 1.02*  CALCIUM 9.1 8.6*  --  8.4*   GFR: Estimated Creatinine Clearance: 45.3 mL/min (A) (by C-G formula based on SCr of 1.02 mg/dL (H)). Liver Function Tests: No results for input(s): AST, ALT, ALKPHOS, BILITOT, PROT, ALBUMIN in the last 168 hours. No results for input(s): LIPASE, AMYLASE in the last 168 hours. No results for input(s): AMMONIA in the last 168 hours. Coagulation Profile: No results for input(s): INR, PROTIME in the last 168 hours. Cardiac Enzymes: No results for input(s): CKTOTAL, CKMB, CKMBINDEX, TROPONINI in the last 168 hours. BNP (last 3 results) No results for input(s): PROBNP in the last 8760 hours. HbA1C: No results for input(s): HGBA1C in the last 72 hours. CBG: Recent Labs  Lab 02/03/20 0825 02/03/20 1224 02/03/20 1718 02/03/20 2129 02/04/20 0755  GLUCAP 141* 216* 125* 256* 196*   Lipid Profile: No results for input(s): CHOL, HDL, LDLCALC, TRIG, CHOLHDL, LDLDIRECT in the last 72 hours. Thyroid Function Tests: No results for input(s): TSH, T4TOTAL, FREET4, T3FREE, THYROIDAB in the last 72 hours. Anemia Panel: No results for input(s): VITAMINB12, FOLATE, FERRITIN, TIBC, IRON, RETICCTPCT in the last 72 hours.    Radiology Studies: I have reviewed all of the imaging during this hospital visit personally     Scheduled Meds: .  amLODipine  5 mg Oral Daily  . aspirin EC  81 mg Oral Daily  . atorvastatin  80 mg Oral Daily  . carvedilol  25 mg Oral BID WC  . enoxaparin (LOVENOX) injection  40 mg Subcutaneous Daily  . insulin aspart  0-5 Units Subcutaneous QHS  . insulin aspart  0-9 Units Subcutaneous TID WC  .  isosorbide mononitrate  30 mg Oral Daily  . losartan  100 mg Oral Daily  . sodium chloride flush  3 mL Intravenous Once   Continuous Infusions:   LOS: 1 day        Reeve Turnley Annett Gula, MD

## 2020-02-05 ENCOUNTER — Encounter (HOSPITAL_COMMUNITY): Payer: Self-pay | Admitting: Internal Medicine

## 2020-02-05 ENCOUNTER — Other Ambulatory Visit (HOSPITAL_COMMUNITY): Payer: Medicare Other

## 2020-02-05 DIAGNOSIS — I200 Unstable angina: Secondary | ICD-10-CM | POA: Diagnosis not present

## 2020-02-05 LAB — BASIC METABOLIC PANEL
Anion gap: 5 (ref 5–15)
BUN: 14 mg/dL (ref 8–23)
CO2: 30 mmol/L (ref 22–32)
Calcium: 8.6 mg/dL — ABNORMAL LOW (ref 8.9–10.3)
Chloride: 106 mmol/L (ref 98–111)
Creatinine, Ser: 0.98 mg/dL (ref 0.44–1.00)
GFR calc Af Amer: >60 mL/min (ref >60)
GFR calc non Af Amer: 57 mL/min — ABNORMAL LOW (ref >60)
Glucose, Bld: 137 mg/dL — ABNORMAL HIGH (ref 70–99)
Potassium: 4 mmol/L (ref 3.5–5.1)
Sodium: 141 mmol/L (ref 135–145)

## 2020-02-05 LAB — GLUCOSE, CAPILLARY
Glucose-Capillary: 203 mg/dL — ABNORMAL HIGH (ref 70–99)
Glucose-Capillary: 138 mg/dL — ABNORMAL HIGH (ref 70–99)
Glucose-Capillary: 142 mg/dL — ABNORMAL HIGH (ref 70–99)
Glucose-Capillary: 175 mg/dL — ABNORMAL HIGH (ref 70–99)

## 2020-02-05 NOTE — Progress Notes (Signed)
PROGRESS NOTE    Bianca [name]  OIZ:[id number] DOB: 06-05-1946 DOA: 02/02/2020 PCP: Luciano Cutter, PA-C    Brief Narrative:  Patient was admitted to the hospital with the working diagnosis of chest pain,unstable angina.  74 year old female who presented with chest pain, she does have significant past medical history for hypertension, dyslipidemia, type 2 diabetes mellitus, tobacco abuse and coronary artery disease. She reported sudden onset of substernal chest pain around 10 AM on the day of admission, sharp in nature, 7/10 in intensity, nonradiating, improved with sublingual nitroglycerin,and associated with dyspnea. On his initial physical examination blood pressure 149/72, heart rate 73, respirate 16, temperature afebrile, oxygen saturation 96%, her lungs were clear to auscultation bilaterally, heart S1-S2 present and rhythmic, abdomen soft, no lower extremity edema. Sodium 142, potassium 3.3, chloride 106, bicarb 29, glucose 157, BUN 16, creatinine 0.81, troponin I 25, white count 6.2, hemoglobin 11.7, hematocrit 38.6, platelets 199.SARS COVID-19 negative. EKG 67 bpm, left axis deviation, right bundle branch block, manually corrected QTC 463, sinus rhythm with a left atrial enlargement, no ST segment changes, NEW negative T wave lead I aVL.  Patient's chest pain has improved, now she agrees for coronary revascularization. CT surgery team has been consulted and plan for CABG later this week    Assessment & Plan:   Principal Problem:   Unstable angina (HCC) Active Problems:   Type 2 diabetes mellitus (HCC)   Essential hypertension   Hyperlipidemia   CAD (coronary artery disease)   Hypokalemia    1. Chest pain, unstable angina. Follow up EKG  with resolved lateral I-AVL t wave inversions.  Continue with aggressive medical therapy with: Aspirin, carvedilol, losaratn, isosorbide and atorvastatin.  Patient will undergo CABG on this admission.   2. Uncontrolled  T2DM (Hgb A1c 9,0). Fasting glucose is 137, Glucose cover and monitoring with insulin sliding scale.   3. HTN.Continue with amlodipine, losartan coreg and isosorbide, for blood pressure control.  4. Dyslipidemia. Continue with statin therapy   5. Hypokalemia. Renal function stable with serum cr at 0,98, electrolytes have been corrected with K at 4,0 and serum bicarb at 30.  DVT prophylaxis:enoxaparin Code Status:full Family Communication:no family at the bedside   Disposition Plan: Patient is from:Home Anticipated DC PJ:ASNK Anticipated DC date:To be determined Anticipated DC barriers:Unstable angina, patient will need CABG on this admission.     Consultants:  Cardiology  CT surgery       Subjective: Patient is feeling better, no further chest pain at rest, no nausea or vomiting, no dyspnea.   Objective: Vitals:   02/04/20 2042 02/05/20 0528 02/05/20 0531 02/05/20 0948  BP: (!) 146/76  (!) 154/68 140/62  Pulse: 65  73   Resp: 17  15   Temp: 98.2 F (36.8 C)  98.5 F (36.9 C)   TempSrc: Oral  Oral   SpO2: 99%  97%   Weight:  71.1 kg    Height:        Intake/Output Summary (Last 24 hours) at 02/05/2020 1108 Last data filed at 02/04/2020 2032 Gross per 24 hour  Intake 650.78 ml  Output --  Net 650.78 ml   Filed Weights   02/03/20 1711 02/04/20 0600 02/05/20 0528  Weight: 69.9 kg 70.4 kg 71.1 kg    Examination:   General: Not in pain or dyspnea,  Neurology: Awake and alert, non focal  E ENT: no pallor, no icterus, oral mucosa moist Cardiovascular: No JVD. S1-S2 present, rhythmic, no gallops, rubs, or murmurs. No lower extremity  edema. Pulmonary: positive breath sounds bilaterally, adequate air movement, no wheezing, rhonchi or rales. Gastrointestinal. Abdomen with no organomegaly, non tender, no  rebound or guarding Skin. No rashes Musculoskeletal: no joint deformities     Data Reviewed: I have personally reviewed following labs and imaging studies  CBC: Recent Labs  Lab 01/29/20 1230 02/02/20 1642 02/03/20 0406  WBC 6.1 6.2 6.2  HGB 12.7 11.7* 11.8*  HCT 39.8 38.6 39.6  MCV 85 88.9 87.2  PLT 209 199 902   Basic Metabolic Panel: Recent Labs  Lab 01/29/20 1230 02/02/20 1642 02/03/20 0406 02/04/20 0336 02/05/20 0355  NA 144 142  --  141 141  K 4.2 3.3*  --  3.4* 4.0  CL 105 106  --  107 106  CO2 24 29  --  30 30  GLUCOSE 128* 157*  --  140* 137*  BUN 14 16  --  15 14  CREATININE 0.82 0.81 0.90 1.02* 0.98  CALCIUM 9.1 8.6*  --  8.4* 8.6*   GFR: Estimated Creatinine Clearance: 47.1 mL/min (by C-G formula based on SCr of 0.98 mg/dL). Liver Function Tests: No results for input(s): AST, ALT, ALKPHOS, BILITOT, PROT, ALBUMIN in the last 168 hours. No results for input(s): LIPASE, AMYLASE in the last 168 hours. No results for input(s): AMMONIA in the last 168 hours. Coagulation Profile: No results for input(s): INR, PROTIME in the last 168 hours. Cardiac Enzymes: No results for input(s): CKTOTAL, CKMB, CKMBINDEX, TROPONINI in the last 168 hours. BNP (last 3 results) No results for input(s): PROBNP in the last 8760 hours. HbA1C: No results for input(s): HGBA1C in the last 72 hours. CBG: Recent Labs  Lab 02/03/20 2129 02/04/20 0755 02/04/20 1110 02/04/20 1624 02/05/20 0800  GLUCAP 256* 196* 130* 230* 203*   Lipid Profile: No results for input(s): CHOL, HDL, LDLCALC, TRIG, CHOLHDL, LDLDIRECT in the last 72 hours. Thyroid Function Tests: No results for input(s): TSH, T4TOTAL, FREET4, T3FREE, THYROIDAB in the last 72 hours. Anemia Panel: No results for input(s): VITAMINB12, FOLATE, FERRITIN, TIBC, IRON, RETICCTPCT in the last 72 hours.    Radiology Studies: I have reviewed all of the imaging during this hospital visit personally     Scheduled  Meds: . amLODipine  10 mg Oral Daily  . aspirin EC  81 mg Oral Daily  . atorvastatin  80 mg Oral Daily  . carvedilol  25 mg Oral BID WC  . enoxaparin (LOVENOX) injection  40 mg Subcutaneous Daily  . insulin aspart  0-5 Units Subcutaneous QHS  . insulin aspart  0-9 Units Subcutaneous TID WC  . isosorbide mononitrate  60 mg Oral Daily  . losartan  100 mg Oral Daily  . polyethylene glycol  17 g Oral Daily  . sodium chloride flush  3 mL Intravenous Once   Continuous Infusions:   LOS: 2 days        Burnetta Kohls Gerome Apley, MD

## 2020-02-05 NOTE — Progress Notes (Addendum)
Progress Note  Patient Name: Bianca [name] Date of Encounter: 02/05/2020  Primary Cardiologist: Quay Burow, MD 01/29/2020  Subjective   Feeling well today. Awaiting CABG. Denies chest pain, SOB  Inpatient Medications    Scheduled Meds: . amLODipine  10 mg Oral Daily  . aspirin EC  81 mg Oral Daily  . atorvastatin  80 mg Oral Daily  . carvedilol  25 mg Oral BID WC  . enoxaparin (LOVENOX) injection  40 mg Subcutaneous Daily  . insulin aspart  0-5 Units Subcutaneous QHS  . insulin aspart  0-9 Units Subcutaneous TID WC  . isosorbide mononitrate  60 mg Oral Daily  . losartan  100 mg Oral Daily  . polyethylene glycol  17 g Oral Daily  . sodium chloride flush  3 mL Intravenous Once   Continuous Infusions:  PRN Meds: acetaminophen, alum & mag hydroxide-simeth, nitroGLYCERIN, ondansetron (ZOFRAN) IV   Vital Signs    Vitals:   02/04/20 1400 02/04/20 2042 02/05/20 0528 02/05/20 0531  BP: 131/63 (!) 146/76  (!) 154/68  Pulse:  65  73  Resp: 16 17  15   Temp: 98.2 F (36.8 C) 98.2 F (36.8 C)  98.5 F (36.9 C)  TempSrc: Oral Oral  Oral  SpO2:  99%  97%  Weight:   71.1 kg   Height:        Intake/Output Summary (Last 24 hours) at 02/05/2020 0801 Last data filed at 02/04/2020 2032 Gross per 24 hour  Intake 650.78 ml  Output --  Net 650.78 ml   Filed Weights   02/03/20 1711 02/04/20 0600 02/05/20 0528  Weight: 69.9 kg 70.4 kg 71.1 kg    Physical Exam   General: Well developed, well nourished, NAD Neck: Negative for carotid bruits. No JVD Lungs:Clear to ausculation bilaterally. Breathing is unlabored. Cardiovascular: RRR with S1 S2. No murmur Extremities: No edema. Radial DP/PT pulses 2+ bilaterally Neuro: Alert and oriented. No focal deficits. No facial asymmetry. MAE spontaneously. Psych: Responds to questions appropriately with normal affect.    Labs    Chemistry Recent Labs  Lab 02/02/20 1642 02/02/20 1642 02/03/20 0406 02/04/20 0336  02/05/20 0355  NA 142  --   --  141 141  K 3.3*  --   --  3.4* 4.0  CL 106  --   --  107 106  CO2 29  --   --  30 30  GLUCOSE 157*  --   --  140* 137*  BUN 16  --   --  15 14  CREATININE 0.81   < > 0.90 1.02* 0.98  CALCIUM 8.6*  --   --  8.4* 8.6*  GFRNONAA >60   < > >60 54* 57*  GFRAA >60   < > >60 >60 >60  ANIONGAP 7  --   --  4* 5   < > = values in this interval not displayed.     Hematology Recent Labs  Lab 01/29/20 1230 02/02/20 1642 02/03/20 0406  WBC 6.1 6.2 6.2  RBC 4.68 4.34 4.54  HGB 12.7 11.7* 11.8*  HCT 39.8 38.6 39.6  MCV 85 88.9 87.2  MCH 27.1 27.0 26.0  MCHC 31.9 30.3 29.8*  RDW 13.6 14.1 14.2  PLT 209 199 184    Cardiac EnzymesNo results for input(s): TROPONINI in the last 168 hours. No results for input(s): TROPIPOC in the last 168 hours.   BNP Recent Labs  Lab 02/02/20 1642  BNP 466.5*  DDimer No results for input(s): DDIMER in the last 168 hours.   Radiology    No results found.  Telemetry    02/05/20 NSR - Personally Reviewed  ECG    No new tracing as of 02/05/20 - Personally Reviewed  Cardiac Studies   ECHO: 01/06/2020 1. Left ventricular ejection fraction, by estimation, is 50 to 55%. The  left ventricle has low normal function. The left ventricle has no regional  wall motion abnormalities. There is mild concentric left ventricular  hypertrophy. Indeterminate diastolic filling due to E-A fusion.  2. Right ventricular systolic function is normal. The right ventricular  size is normal. Tricuspid regurgitation signal is inadequate for assessing  PA pressure.  3. The mitral valve is grossly normal. Trivial mitral valve  regurgitation.  4. The aortic valve is tricuspid. Aortic valve regurgitation is not  visualized. No aortic stenosis is present.  5. The inferior vena cava is normal in size with greater than 50%  respiratory variability, suggesting right atrial pressure of 3 mmHg.   CATH:  01/31/2020 Diagnostic Dominance: Left  IMPRESSION:Ms. Coonrod has a left dominant system with high-grade disease in her proximal LAD, first and second diagonal branches, ramus branch, circumflex obtuse marginal branch and distal circumflex. She has low normal LV function by recent 2D echo. Her anatomy is suitable for CABG. Sheath was removed and a TR band was placed on the right wrist to achieve patent hemostasis. The patient left lab in stable condition. T CTS has been consulted.  Patient Profile     74 y.o. female with a hx of hypertensive urgency and acute pulmonary edemawith NSTEMI 12/2019, HTN, HLD, DM, tobacco abuse, CAD, RBBB, who is being seen today for the evaluation of USAP at the request of Dr Ella Jubilee.  Assessment & Plan    1. CAD: -Recurrent chest pain consistent with unstable angina. Recently admitted for non-STEMI with LHC revealing three-vessel disease at which time CV surgery with bypass revascularization was recommended however patient initially refused.  She was readmitted the following day with recurrent angina>> now agreeable to proceed with CABG  -Continue current regimen with ASA, high intensity statin, beta-blocker -CV surgery with plans for CABG this week  2. HTN: -Stable, 146/76, 131/63 -Amlodipine 10, carvedilol 25, Imdur 60, losartan 100  -Creatinine stable  3. HLD: -Last LDL, 152 with a goal LDL < 70 -Continue Lipitor 80 mg -Pete lipid panel, LFTs in 6 to 8 weeks  Signed, Georgie Chard NP-C [facility] Pager: [phone] 02/05/2020, 8:01 AM    For questions or updates, please contact   Please consult [email or web address] for contact info under Cardiology/STEMI.  Attending Note:   The patient was seen and examined.  Agree with assessment and plan as noted above.  Changes made to the above note as needed.  Patient seen and independently examined with Georgie Chard. NP .   We discussed all aspects of the encounter. I agree with the assessment and plan  as stated above.  1.   CAD :  Agree with plans for CABG later this week.   2.  HLD:  Cont. Atorvastatin   3.  HTN:   Cont . meds   bp looks good    I have spent a total of 40 minutes with patient reviewing hospital  notes , telemetry, EKGs, labs and examining patient as well as establishing an assessment and plan that was discussed with the patient. > 50% of time was spent in direct patient care.    Vesta Mixer,  Montez Hageman., MD, [facility] 02/05/2020, 11:44 AM [address],  [address] Office [phone] Pager [phone]

## 2020-02-06 ENCOUNTER — Inpatient Hospital Stay (HOSPITAL_COMMUNITY): Payer: Medicare Other

## 2020-02-06 DIAGNOSIS — I200 Unstable angina: Secondary | ICD-10-CM | POA: Diagnosis not present

## 2020-02-06 DIAGNOSIS — Z01810 Encounter for preprocedural cardiovascular examination: Secondary | ICD-10-CM

## 2020-02-06 LAB — GLUCOSE, CAPILLARY
Glucose-Capillary: 197 mg/dL — ABNORMAL HIGH (ref 70–99)
Glucose-Capillary: 163 mg/dL — ABNORMAL HIGH (ref 70–99)
Glucose-Capillary: 111 mg/dL — ABNORMAL HIGH (ref 70–99)
Glucose-Capillary: 207 mg/dL — ABNORMAL HIGH (ref 70–99)

## 2020-02-06 NOTE — Progress Notes (Signed)
Pre op vascular ultrasound       has been completed. Preliminary results can be found under CV proc through chart review. Nollie Shiflett, BS, RDMS, RVT   

## 2020-02-06 NOTE — Progress Notes (Addendum)
Progress Note  Patient Name: Bianca [name] Date of Encounter: 02/06/2020  Primary Cardiologist: Nanetta Batty, MD4/20/2021  Subjective   Pt doing well. No complaints today. Anticipating surgery this week.   Inpatient Medications    Scheduled Meds: . amLODipine  10 mg Oral Daily  . aspirin EC  81 mg Oral Daily  . atorvastatin  80 mg Oral Daily  . carvedilol  25 mg Oral BID WC  . enoxaparin (LOVENOX) injection  40 mg Subcutaneous Daily  . insulin aspart  0-5 Units Subcutaneous QHS  . insulin aspart  0-9 Units Subcutaneous TID WC  . isosorbide mononitrate  60 mg Oral Daily  . losartan  100 mg Oral Daily  . polyethylene glycol  17 g Oral Daily  . sodium chloride flush  3 mL Intravenous Once   Continuous Infusions:  PRN Meds: acetaminophen, alum & mag hydroxide-simeth, nitroGLYCERIN, ondansetron (ZOFRAN) IV   Vital Signs    Vitals:   02/05/20 0948 02/05/20 1627 02/05/20 2058 02/06/20 0345  BP: 140/62 125/60 (!) 145/73 (!) 151/70  Pulse:  65 60 66  Resp:  16 15 16   Temp:  98.1 F (36.7 C) 98.2 F (36.8 C) 98.1 F (36.7 C)  TempSrc:  Oral Oral Oral  SpO2:  100% 100% 100%  Weight:      Height:       No intake or output data in the 24 hours ending 02/06/20 0739 Filed Weights   02/03/20 1711 02/04/20 0600 02/05/20 0528  Weight: 69.9 kg 70.4 kg 71.1 kg    Physical Exam   General: Well developed, well nourished, NAD Neck: Negative for carotid bruits. No JVD Lungs:Clear to ausculation bilaterally. No wheezes, rales, or rhonchi. Breathing is unlabored. Cardiovascular: RRR with S1 S2. No murmurs Abdomen: Soft, non-tender, non-distended. No obvious abdominal masses. Extremities: No edema. Radial pulses 2+ bilaterally Neuro: Alert and oriented. No focal deficits. No facial asymmetry. MAE spontaneously. Psych: Responds to questions appropriately with normal affect.    Labs    Chemistry Recent Labs  Lab 02/02/20 1642 02/02/20 1642 02/03/20 0406  02/04/20 0336 02/05/20 0355  NA 142  --   --  141 141  K 3.3*  --   --  3.4* 4.0  CL 106  --   --  107 106  CO2 29  --   --  30 30  GLUCOSE 157*  --   --  140* 137*  BUN 16  --   --  15 14  CREATININE 0.81   < > 0.90 1.02* 0.98  CALCIUM 8.6*  --   --  8.4* 8.6*  GFRNONAA >60   < > >60 54* 57*  GFRAA >60   < > >60 >60 >60  ANIONGAP 7  --   --  4* 5   < > = values in this interval not displayed.     Hematology Recent Labs  Lab 02/02/20 1642 02/03/20 0406  WBC 6.2 6.2  RBC 4.34 4.54  HGB 11.7* 11.8*  HCT 38.6 39.6  MCV 88.9 87.2  MCH 27.0 26.0  MCHC 30.3 29.8*  RDW 14.1 14.2  PLT 199 184    Cardiac EnzymesNo results for input(s): TROPONINI in the last 168 hours. No results for input(s): TROPIPOC in the last 168 hours.   BNP Recent Labs  Lab 02/02/20 1642  BNP 466.5*     DDimer No results for input(s): DDIMER in the last 168 hours.   Radiology    No  results found.  Telemetry    02/06/20 NSR - Personally Reviewed  ECG    No new tracing as of 02/06/20- Personally Reviewed  Cardiac Studies   ECHO:01/06/2020 1. Left ventricular ejection fraction, by estimation, is 50 to 55%. The  left ventricle has low normal function. The left ventricle has no regional  wall motion abnormalities. There is mild concentric left ventricular  hypertrophy. Indeterminate diastolic filling due to E-A fusion.  2. Right ventricular systolic function is normal. The right ventricular  size is normal. Tricuspid regurgitation signal is inadequate for assessing  PA pressure.  3. The mitral valve is grossly normal. Trivial mitral valve  regurgitation.  4. The aortic valve is tricuspid. Aortic valve regurgitation is not  visualized. No aortic stenosis is present.  5. The inferior vena cava is normal in size with greater than 50%  respiratory variability, suggesting right atrial pressure of 3 mmHg.   CATH:01/31/2020 Diagnostic Dominance: Left  IMPRESSION:Bianca [name] has a  left dominant system with high-grade disease in her proximal LAD, first and second diagonal branches, ramus branch, circumflex obtuse marginal branch and distal circumflex. She has low normal LV function by recent 2D echo. Her anatomy is suitable for CABG. Sheath was removed and a TR band was placed on the right wrist to achieve patent hemostasis. The patient left lab in stable condition. T CTS has been consulted  Patient Profile     74 y.o. female with a hx of hypertensive urgency and acute pulmonary edemawith NSTEMI03/2021,HTN,HLD, DM,tobacco abuse, CAD, RBBB,who is being seen today for the evaluation ofUSAPat the request of Dr Cathlean Sauer.  Assessment & Plan    1. CAD: -Recurrent chest pain consistent with unstable angina. Recently admitted for non-STEMI with LHC revealing three-vessel disease at which time CV surgery with bypass revascularization was recommended however patient initially refused.  She was readmitted the following day with recurrent angina>> now agreeable to proceed with CABG  -Continue current regimen with ASA, high intensity statin, beta-blocker -CV surgery with plans for CABG this week  2. HTN: -Stable, 151/70>145/73 -Amlodipine 10, carvedilol 25, Imdur 60, losartan 100  -Creatinine stable  3. HLD: -Last LDL, 152 with a goal LDL < 70 -Continue Lipitor 80 mg -Pete lipid panel, LFTs in 6 to 8 weeks   Signed, Kathyrn Drown NP-C [facility] Pager: [phone] 02/06/2020, 7:39 AM     For questions or updates, please contact   Please consult [email or web address] for contact info under Cardiology/STEMI.  Attending Note:   The patient was seen and examined.  Agree with assessment and plan as noted above.  Changes made to the above note as needed.  Patient seen and independently examined with  Kathyrn Drown, NP .   We discussed all aspects of the encounter. I agree with the assessment and plan as stated above.  1.  CAD :  Plans are for CABG this week.   2.   HTN:   bp is better .  135 / 15 currently     I have spent a total of 40 minutes with patient reviewing hospital  notes , telemetry, EKGs, labs and examining patient as well as establishing an assessment and plan that was discussed with the patient. > 50% of time was spent in direct patient care.    Thayer Headings, Brooke Bonito., MD, [facility] 02/06/2020, 12:28 PM [address],  [place] Pager [phone]

## 2020-02-06 NOTE — Progress Notes (Signed)
PROGRESS NOTE    Bianca [name]  BMW:[id number] DOB: 23-Feb-1946 DOA: 02/02/2020 PCP: Luciano Cutter, PA-C   Brief Narrative:  74 year old female who presented with chest pain, she does have significant past medical history for hypertension, dyslipidemia, type 2 diabetes mellitus, tobacco abuse and coronary artery disease.  Patient diagnosed with unstable angina   Assessment & Plan:   Principal Problem:   Unstable angina (HCC) Active Problems:   Type 2 diabetes mellitus (HCC)   Essential hypertension   Hyperlipidemia   CAD (coronary artery disease)   Hypokalemia   Chest pain, unstable angina. Currently patient is chest pain-free.  Seen by cardiology team.  Left heart cath showed multivessel disease who will require CABG for revascularization.  Plans for this later this week per CT surgery Continue aspirin, statin and beta-blocker.   Uncontrolled T2DM (Hgb A1c 9,0). Sliding scale and Accu-Chek.  Essential hypertension Norvasc 10 mg daily, Coreg 25 mg twice daily, isosorbide mononitrate 60 mg daily, losartan 100 mg daily  Dyslipidemia. Lipitor 80 mg daily   DVT prophylaxis: Lovenox Code Status: Full Family Communication: None Disposition Plan:   Patient From= home  Patient Anticipated D/C place= to be determined  Barriers= maintain hospital stay for CABG.   Subjective: Feels okay no complaints  Review of Systems Otherwise negative except as per HPI, including: General: Denies fever, chills, night sweats or unintended weight loss. Resp: Denies cough, wheezing, shortness of breath. Cardiac: Denies chest pain, palpitations, orthopnea, paroxysmal nocturnal dyspnea. GI: Denies abdominal pain, nausea, vomiting, diarrhea or constipation GU: Denies dysuria, frequency, hesitancy or incontinence MS: Denies muscle aches, joint pain or swelling Neuro: Denies headache, neurologic deficits (focal weakness, numbness, tingling), abnormal gait Psych: Denies anxiety,  depression, SI/HI/AVH Skin: Denies new rashes or lesions ID: Denies sick contacts, exotic exposures, travel  Examination:  General exam: Appears calm and comfortable  Respiratory system: Clear to auscultation. Respiratory effort normal. Cardiovascular system: S1 & S2 heard, RRR. No JVD, murmurs, rubs, gallops or clicks. No pedal edema. Gastrointestinal system: Abdomen is nondistended, soft and nontender. No organomegaly or masses felt. Normal bowel sounds heard. Central nervous system: Alert and oriented. No focal neurological deficits. Extremities: Symmetric 5 x 5 power. Skin: No rashes, lesions or ulcers Psychiatry: Judgement and insight appear normal. Mood & affect appropriate.     Objective: Vitals:   02/05/20 1627 02/05/20 2058 02/06/20 0345 02/06/20 0848  BP: 125/60 (!) 145/73 (!) 151/70 135/65  Pulse: 65 60 66   Resp: 16 15 16    Temp: 98.1 F (36.7 C) 98.2 F (36.8 C) 98.1 F (36.7 C)   TempSrc: Oral Oral Oral   SpO2: 100% 100% 100%   Weight:      Height:        Intake/Output Summary (Last 24 hours) at 02/06/2020 1339 Last data filed at 02/06/2020 1200 Gross per 24 hour  Intake 444 ml  Output --  Net 444 ml   Filed Weights   02/03/20 1711 02/04/20 0600 02/05/20 0528  Weight: 69.9 kg 70.4 kg 71.1 kg     Data Reviewed:   CBC: Recent Labs  Lab 02/02/20 1642 02/03/20 0406  WBC 6.2 6.2  HGB 11.7* 11.8*  HCT 38.6 39.6  MCV 88.9 87.2  PLT 199 184   Basic Metabolic Panel: Recent Labs  Lab 02/02/20 1642 02/03/20 0406 02/04/20 0336 02/05/20 0355  NA 142  --  141 141  K 3.3*  --  3.4* 4.0  CL 106  --  107 106  CO2 29  --  30 30  GLUCOSE 157*  --  140* 137*  BUN 16  --  15 14  CREATININE 0.81 0.90 1.02* 0.98  CALCIUM 8.6*  --  8.4* 8.6*   GFR: Estimated Creatinine Clearance: 47.1 mL/min (by C-G formula based on SCr of 0.98 mg/dL). Liver Function Tests: No results for input(s): AST, ALT, ALKPHOS, BILITOT, PROT, ALBUMIN in the last 168 hours. No  results for input(s): LIPASE, AMYLASE in the last 168 hours. No results for input(s): AMMONIA in the last 168 hours. Coagulation Profile: No results for input(s): INR, PROTIME in the last 168 hours. Cardiac Enzymes: No results for input(s): CKTOTAL, CKMB, CKMBINDEX, TROPONINI in the last 168 hours. BNP (last 3 results) No results for input(s): PROBNP in the last 8760 hours. HbA1C: No results for input(s): HGBA1C in the last 72 hours. CBG: Recent Labs  Lab 02/05/20 1142 02/05/20 1630 02/05/20 2056 02/06/20 0730 02/06/20 1133  GLUCAP 138* 142* 175* 197* 163*   Lipid Profile: No results for input(s): CHOL, HDL, LDLCALC, TRIG, CHOLHDL, LDLDIRECT in the last 72 hours. Thyroid Function Tests: No results for input(s): TSH, T4TOTAL, FREET4, T3FREE, THYROIDAB in the last 72 hours. Anemia Panel: No results for input(s): VITAMINB12, FOLATE, FERRITIN, TIBC, IRON, RETICCTPCT in the last 72 hours. Sepsis Labs: No results for input(s): PROCALCITON, LATICACIDVEN in the last 168 hours.  Recent Results (from the past 240 hour(s))  SARS CORONAVIRUS 2 (TAT 6-24 HRS) Nasopharyngeal Nasopharyngeal Swab     Status: None   Collection Time: 01/29/20 12:54 PM   Specimen: Nasopharyngeal Swab  Result Value Ref Range Status   SARS Coronavirus 2 NEGATIVE NEGATIVE Final    Comment: (NOTE) SARS-CoV-2 target nucleic acids are NOT DETECTED. The SARS-CoV-2 RNA is generally detectable in upper and lower respiratory specimens during the acute phase of infection. Negative results do not preclude SARS-CoV-2 infection, do not rule out co-infections with other pathogens, and should not be used as the sole basis for treatment or other patient management decisions. Negative results must be combined with clinical observations, patient history, and epidemiological information. The expected result is Negative. Fact Sheet for Patients: HairSlick.nohttps://www.fda.gov/media/138098/download Fact Sheet for Healthcare  Providers: quierodirigir.comhttps://www.fda.gov/media/138095/download This test is not yet approved or cleared by the [place] FDA and  has been authorized for detection and/or diagnosis of SARS-CoV-2 by FDA under an Emergency Use Authorization (EUA). This EUA will remain  in effect (meaning this test can be used) for the duration of the COVID-19 declaration under Section 56 4(b)(1) of the Act, 21 U.S.C. section 360bbb-3(b)(1), unless the authorization is terminated or revoked sooner. Performed at [facility], [address] [place], [place] [address]   Respiratory Panel by RT PCR (Flu A&B, Covid) - Nasopharyngeal Swab     Status: None   Collection Time: 02/03/20 12:50 AM   Specimen: Nasopharyngeal Swab  Result Value Ref Range Status   SARS Coronavirus 2 by RT PCR NEGATIVE NEGATIVE Final    Comment: (NOTE) SARS-CoV-2 target nucleic acids are NOT DETECTED. The SARS-CoV-2 RNA is generally detectable in upper respiratoy specimens during the acute phase of infection. The lowest concentration of SARS-CoV-2 viral copies this assay can detect is 131 copies/mL. A negative result does not preclude SARS-Cov-2 infection and should not be used as the sole basis for treatment or other patient management decisions. A negative result may occur with  improper specimen collection/handling, submission of specimen other than nasopharyngeal swab, presence of viral mutation(s) within the areas targeted by this assay, and inadequate number  of viral copies (<131 copies/mL). A negative result must be combined with clinical observations, patient history, and epidemiological information. The expected result is Negative. Fact Sheet for Patients:  https://www.moore.com/ Fact Sheet for Healthcare Providers:  https://www.young.biz/ This test is not yet ap proved or cleared by the [place] FDA and  has been authorized for detection and/or diagnosis of SARS-CoV-2 by FDA  under an Emergency Use Authorization (EUA). This EUA will remain  in effect (meaning this test can be used) for the duration of the COVID-19 declaration under Section 564(b)(1) of the Act, 21 U.S.C. section 360bbb-3(b)(1), unless the authorization is terminated or revoked sooner.    Influenza A by PCR NEGATIVE NEGATIVE Final   Influenza B by PCR NEGATIVE NEGATIVE Final    Comment: (NOTE) The Xpert Xpress SARS-CoV-2/FLU/RSV assay is intended as an aid in  the diagnosis of influenza from Nasopharyngeal swab specimens and  should not be used as a sole basis for treatment. Nasal washings and  aspirates are unacceptable for Xpert Xpress SARS-CoV-2/FLU/RSV  testing. Fact Sheet for Patients: https://www.moore.com/ Fact Sheet for Healthcare Providers: https://www.young.biz/ This test is not yet approved or cleared by the [place] FDA and  has been authorized for detection and/or diagnosis of SARS-CoV-2 by  FDA under an Emergency Use Authorization (EUA). This EUA will remain  in effect (meaning this test can be used) for the duration of the  Covid-19 declaration under Section 564(b)(1) of the Act, 21  U.S.C. section 360bbb-3(b)(1), unless the authorization is  terminated or revoked. Performed at [facility], [address], [place], [place] [address]          Radiology Studies: VAS US DOPPLER PRE CABG  Result Date: 02/06/2020 PREOPERATIVE VASCULAR EVALUATION  Indications:  Pre-CABG. Risk Factors: Hypertension, Diabetes. Performing Technologist: Jeb Levering Rvt, Rdms  Examination Guidelines: A complete evaluation includes B-mode imaging, spectral Doppler, color Doppler, and power Doppler as needed of all accessible portions of each vessel. Bilateral testing is considered an integral part of a complete examination. Limited examinations for reoccurring indications may be performed as noted.  Right Carotid Findings:  +----------+--------+--------+--------+------------+--------+           PSV cm/sEDV cm/sStenosisDescribe    Comments +----------+--------+--------+--------+------------+--------+ CCA Prox  81      16                                   +----------+--------+--------+--------+------------+--------+ CCA Distal66      13                                   +----------+--------+--------+--------+------------+--------+ ICA Prox  73      24      1-39%   heterogenous         +----------+--------+--------+--------+------------+--------+ ICA Distal72      22                                   +----------+--------+--------+--------+------------+--------+ ECA       148     22                                   +----------+--------+--------+--------+------------+--------+ Portions of this table do not appear on this page. +----------+--------+-------+----------------+------------+  PSV cm/sEDV cmsDescribe        Arm Pressure +----------+--------+-------+----------------+------------+ Subclavian99             Multiphasic, [id number]          +----------+--------+-------+----------------+------------+ +---------+--------+---+--------+--+---------+ VertebralPSV cm/s104EDV cm/s20Antegrade +---------+--------+---+--------+--+---------+ Left Carotid Findings: +----------+--------+--------+--------+-------------------------+--------+           PSV cm/sEDV cm/sStenosisDescribe                 Comments +----------+--------+--------+--------+-------------------------+--------+ CCA Prox  80      16                                                +----------+--------+--------+--------+-------------------------+--------+ CCA Distal81      22                                                +----------+--------+--------+--------+-------------------------+--------+ ICA Prox  78      16      1-39%   heterogenous and calcific          +----------+--------+--------+--------+-------------------------+--------+ ICA Distal68      23                                                +----------+--------+--------+--------+-------------------------+--------+ ECA       109     9                                                 +----------+--------+--------+--------+-------------------------+--------+ +----------+--------+--------+----------------+------------+ SubclavianPSV cm/sEDV cm/sDescribe        Arm Pressure +----------+--------+--------+----------------+------------+           93              Multiphasic, WNL150          +----------+--------+--------+----------------+------------+ +---------+--------+--+--------+--+---------+ VertebralPSV cm/s69EDV cm/s21Antegrade +---------+--------+--+--------+--+---------+  ABI Findings: +---------+------------------+-----+----------+-----------+ Right    Rt Pressure (mmHg)IndexWaveform  Comment     +---------+------------------+-----+----------+-----------+ Brachial 157                    triphasic             +---------+------------------+-----+----------+-----------+ ATA      137               0.87 monophasic            +---------+------------------+-----+----------+-----------+ PTA                                       not audible +---------+------------------+-----+----------+-----------+ Great Toe92                0.59 Abnormal              +---------+------------------+-----+----------+-----------+ +---------+------------------+-----+----------+-----------+ Left     Lt Pressure (mmHg)IndexWaveform  Comment     +---------+------------------+-----+----------+-----------+ Brachial 150                    triphasic             +---------+------------------+-----+----------+-----------+  ATA      138               0.88 monophasic            +---------+------------------+-----+----------+-----------+ PTA                                        not audible +---------+------------------+-----+----------+-----------+ Great Toe85                0.54 Abnormal              +---------+------------------+-----+----------+-----------+  Right Doppler Findings: +--------+--------+-----+---------+--------+ Site    PressureIndexDoppler  Comments +--------+--------+-----+---------+--------+ [id number]          triphasic         +--------+--------+-----+---------+--------+ Radial               triphasic         +--------+--------+-----+---------+--------+ Ulnar                biphasic          +--------+--------+-----+---------+--------+  Left Doppler Findings: +--------+--------+-----+---------+--------+ Site    PressureIndexDoppler  Comments +--------+--------+-----+---------+--------+ Brachial150          triphasic         +--------+--------+-----+---------+--------+ Radial               triphasic         +--------+--------+-----+---------+--------+ Ulnar                biphasic          +--------+--------+-----+---------+--------+  Summary: Right Carotid: Velocities in the right ICA are consistent with a 1-39% stenosis. Left Carotid: Velocities in the left ICA are consistent with a 1-39% stenosis. Right ABI: Resting right ankle-brachial index indicates mild right lower extremity arterial disease. The right toe-brachial index is abnormal. Left ABI: Resting left ankle-brachial index indicates mild left lower extremity arterial disease. The left toe-brachial index is abnormal. Right Upper Extremity: Doppler waveform obliterate with right radial compression. Doppler waveforms remain within normal limits with right ulnar compression. Left Upper Extremity: Doppler waveform obliterate with left radial compression. Doppler waveforms remain within normal limits with left ulnar compression.     Preliminary         Scheduled Meds: . amLODipine  10 mg Oral Daily  . aspirin EC  81 mg Oral Daily  .  atorvastatin  80 mg Oral Daily  . carvedilol  25 mg Oral BID WC  . enoxaparin (LOVENOX) injection  40 mg Subcutaneous Daily  . insulin aspart  0-5 Units Subcutaneous QHS  . insulin aspart  0-9 Units Subcutaneous TID WC  . isosorbide mononitrate  60 mg Oral Daily  . losartan  100 mg Oral Daily  . polyethylene glycol  17 g Oral Daily  . sodium chloride flush  3 mL Intravenous Once   Continuous Infusions:   LOS: 3 days   Time spent= 25 mins    Misbah Hornaday Arsenio Loader, MD [facility]  If 7PM-7AM, please contact night-coverage  02/06/2020, 1:39 PM

## 2020-02-06 NOTE — Progress Notes (Signed)
Patient stated having some 3/10 chest discomfort that started on the left side of chest and ended up on the lower left flank. She stated it was sharp and it came in waves. Obtained EKG (WDL) and gave 1 sublingual with some relief. She also states it feels bubbly and is passing gas. Gave Maalox and will reassess.    After medication, patient states feeling better.

## 2020-02-06 NOTE — Progress Notes (Signed)
     [address]       [place] [address]             [phone]       No events. She denies any chest pain and is eager to proceed with surgery.  Vitals:   02/06/20 0345 02/06/20 0848  BP: (!) 151/70 135/65  Pulse: 66   Resp: 16   Temp: 98.1 F (36.7 C)   SpO2: 100%    Alert NAD Sinus rhythm Easy work of breathing  74 year old female with severe three-vessel coronary disease who presents with unstable angina.  She is scheduled for CABG April 30th. Risks and benefits of been discussed in detail.  Taitum Menton Keane Scrape

## 2020-02-07 DIAGNOSIS — I200 Unstable angina: Secondary | ICD-10-CM | POA: Diagnosis not present

## 2020-02-07 DIAGNOSIS — I25118 Atherosclerotic heart disease of native coronary artery with other forms of angina pectoris: Secondary | ICD-10-CM | POA: Diagnosis not present

## 2020-02-07 DIAGNOSIS — E782 Mixed hyperlipidemia: Secondary | ICD-10-CM | POA: Diagnosis not present

## 2020-02-07 LAB — BLOOD GAS, ARTERIAL
Acid-Base Excess: 3.1 mmol/L — ABNORMAL HIGH (ref 0.0–2.0)
Bicarbonate: 27.3 mmol/L (ref 20.0–28.0)
Drawn by: 519031
FIO2: 21
O2 Saturation: 96.1 %
Patient temperature: 36.9
pCO2 arterial: 42.9 mmHg (ref 32.0–48.0)
pH, Arterial: 7.418 (ref 7.350–7.450)
pO2, Arterial: 83.2 mmHg (ref 83.0–108.0)

## 2020-02-07 LAB — GLUCOSE, CAPILLARY
Glucose-Capillary: 135 mg/dL — ABNORMAL HIGH (ref 70–99)
Glucose-Capillary: 154 mg/dL — ABNORMAL HIGH (ref 70–99)
Glucose-Capillary: 134 mg/dL — ABNORMAL HIGH (ref 70–99)
Glucose-Capillary: 135 mg/dL — ABNORMAL HIGH (ref 70–99)

## 2020-02-07 LAB — URINALYSIS, ROUTINE W REFLEX MICROSCOPIC
Bacteria, UA: NONE SEEN
Bilirubin Urine: NEGATIVE
Glucose, UA: NEGATIVE mg/dL
Hgb urine dipstick: NEGATIVE
Ketones, ur: NEGATIVE mg/dL
Leukocytes,Ua: NEGATIVE
Nitrite: NEGATIVE
Protein, ur: 100 mg/dL — AB
Specific Gravity, Urine: 1.019 (ref 1.005–1.030)
pH: 6 (ref 5.0–8.0)

## 2020-02-07 LAB — SURGICAL PCR SCREEN
MRSA, PCR: NEGATIVE
Staphylococcus aureus: NEGATIVE

## 2020-02-07 LAB — PREPARE RBC (CROSSMATCH)

## 2020-02-07 LAB — ABO/RH: ABO/RH(D): O POS

## 2020-02-07 LAB — PROTIME-INR
INR: 1 (ref 0.8–1.2)
Prothrombin Time: 12.9 seconds (ref 11.4–15.2)

## 2020-02-07 LAB — HEMOGLOBIN A1C
Hgb A1c MFr Bld: 8.1 % — ABNORMAL HIGH (ref 4.8–5.6)
Mean Plasma Glucose: 185.77 mg/dL

## 2020-02-07 LAB — APTT: aPTT: 35 seconds (ref 24–36)

## 2020-02-07 MED ORDER — SODIUM CHLORIDE 0.9 % IV SOLN
INTRAVENOUS | Status: DC
  Filled 2020-02-07: qty 30

## 2020-02-07 MED ORDER — TRANEXAMIC ACID 1000 MG/10ML IV SOLN
1.5000 mg/kg/h | INTRAVENOUS | Status: AC
  Administered 2020-02-08: 1.5 mg/kg/h via INTRAVENOUS
  Filled 2020-02-07: qty 25

## 2020-02-07 MED ORDER — NOREPINEPHRINE 4 MG/250ML-% IV SOLN
0.0000 ug/min | INTRAVENOUS | Status: DC
  Filled 2020-02-07: qty 250

## 2020-02-07 MED ORDER — TRANEXAMIC ACID (OHS) BOLUS VIA INFUSION
15.0000 mg/kg | INTRAVENOUS | Status: AC
  Administered 2020-02-08: 08:00:00 1077 mg via INTRAVENOUS
  Filled 2020-02-07: qty 1077

## 2020-02-07 MED ORDER — CHLORHEXIDINE GLUCONATE CLOTH 2 % EX PADS
6.0000 | MEDICATED_PAD | Freq: Once | CUTANEOUS | Status: AC
  Administered 2020-02-07: 6 via TOPICAL

## 2020-02-07 MED ORDER — INSULIN REGULAR(HUMAN) IN NACL 100-0.9 UT/100ML-% IV SOLN
INTRAVENOUS | Status: AC
  Administered 2020-02-08: 3.2 [IU]/h via INTRAVENOUS
  Filled 2020-02-07: qty 100

## 2020-02-07 MED ORDER — PLASMA-LYTE 148 IV SOLN
INTRAVENOUS | Status: DC
  Filled 2020-02-07: qty 2.5

## 2020-02-07 MED ORDER — VANCOMYCIN HCL 1250 MG/250ML IV SOLN
1250.0000 mg | INTRAVENOUS | Status: AC
  Administered 2020-02-08: 1250 mg via INTRAVENOUS
  Filled 2020-02-07: qty 250

## 2020-02-07 MED ORDER — MANNITOL 20 % IV SOLN
INTRAVENOUS | Status: DC
  Filled 2020-02-07: qty 13

## 2020-02-07 MED ORDER — POTASSIUM CHLORIDE 2 MEQ/ML IV SOLN
80.0000 meq | INTRAVENOUS | Status: DC
  Filled 2020-02-07: qty 40

## 2020-02-07 MED ORDER — SODIUM CHLORIDE 0.9 % IV SOLN
750.0000 mg | INTRAVENOUS | Status: AC
  Administered 2020-02-08: 750 mg via INTRAVENOUS
  Filled 2020-02-07: qty 750

## 2020-02-07 MED ORDER — CHLORHEXIDINE GLUCONATE 0.12 % MT SOLN
15.0000 mL | Freq: Once | OROMUCOSAL | Status: AC
  Administered 2020-02-08: 15 mL via OROMUCOSAL
  Filled 2020-02-07: qty 15

## 2020-02-07 MED ORDER — EPINEPHRINE HCL 5 MG/250ML IV SOLN IN NS
0.0000 ug/min | INTRAVENOUS | Status: DC
  Filled 2020-02-07: qty 250

## 2020-02-07 MED ORDER — METOPROLOL TARTRATE 12.5 MG HALF TABLET
12.5000 mg | ORAL_TABLET | Freq: Once | ORAL | Status: AC
  Administered 2020-02-08: 12.5 mg via ORAL
  Filled 2020-02-07: qty 1

## 2020-02-07 MED ORDER — TRANEXAMIC ACID (OHS) PUMP PRIME SOLUTION
2.0000 mg/kg | INTRAVENOUS | Status: DC
  Filled 2020-02-07: qty 1.44

## 2020-02-07 MED ORDER — CHLORHEXIDINE GLUCONATE CLOTH 2 % EX PADS: 6.0000 | MEDICATED_PAD | Freq: Once | CUTANEOUS | Status: DC

## 2020-02-07 MED ORDER — TEMAZEPAM 15 MG PO CAPS: 15.0000 mg | ORAL_CAPSULE | Freq: Once | ORAL | Status: DC | PRN

## 2020-02-07 MED ORDER — BISACODYL 5 MG PO TBEC
5.0000 mg | DELAYED_RELEASE_TABLET | Freq: Once | ORAL | Status: AC
  Administered 2020-02-07: 5 mg via ORAL
  Filled 2020-02-07: qty 1

## 2020-02-07 MED ORDER — DEXMEDETOMIDINE HCL IN NACL 400 MCG/100ML IV SOLN
0.1000 ug/kg/h | INTRAVENOUS | Status: AC
  Administered 2020-02-08: .5 ug/kg/h via INTRAVENOUS
  Filled 2020-02-07: qty 100

## 2020-02-07 MED ORDER — MILRINONE LACTATE IN DEXTROSE 20-5 MG/100ML-% IV SOLN
0.3000 ug/kg/min | INTRAVENOUS | Status: DC
  Filled 2020-02-07: qty 100

## 2020-02-07 MED ORDER — SODIUM CHLORIDE 0.9 % IV SOLN
1.5000 g | INTRAVENOUS | Status: AC
  Administered 2020-02-08: 1.5 g via INTRAVENOUS
  Filled 2020-02-07: qty 1.5

## 2020-02-07 MED ORDER — PHENYLEPHRINE HCL-NACL 20-0.9 MG/250ML-% IV SOLN
30.0000 ug/min | INTRAVENOUS | Status: AC
  Administered 2020-02-08: 20 ug/min via INTRAVENOUS
  Filled 2020-02-07: qty 250

## 2020-02-07 MED ORDER — NITROGLYCERIN IN D5W 200-5 MCG/ML-% IV SOLN
2.0000 ug/min | INTRAVENOUS | Status: AC
  Administered 2020-02-08: 10 ug/min via INTRAVENOUS
  Filled 2020-02-07: qty 250

## 2020-02-07 NOTE — Anesthesia Preprocedure Evaluation (Addendum)
Anesthesia Evaluation  Patient identified by MRN, date of birth, ID band Patient awake    Reviewed: Allergy & Precautions, NPO status , Patient's Chart, lab work & pertinent test results  History of Anesthesia Complications Negative for: history of anesthetic complications  Airway Mallampati: II  TM Distance: >3 FB Neck ROM: Full    Dental  (+) Dental Advisory Given   Pulmonary former smoker,    Pulmonary exam normal        Cardiovascular hypertension, Pt. on medications and Pt. on home beta blockers + CAD, + Past MI and +CHF  Normal cardiovascular exam   '21 Cath - Ost LM lesion is 40% stenosed. Ramus lesion is 90% stenosed. LPAV lesion is 90% stenosed. 2nd Mrg-1 lesion is 95% stenosed. 2nd Mrg-2 lesion is 90% stenosed. 1st Diag lesion is 90% stenosed. Dist LAD lesion is 90% stenosed. 2nd Diag lesion is 90% stenosed. Prox LAD lesion is 60% stenosed. Mid LAD lesion is 90% stenosed.  '21 Carotid US - 1-39% b/l ICAS   '21 TTE - EF 50 to 55%. Mild concentric left ventricular hypertrophy. Indeterminate diastolic filling due to E-A fusion. Trivial pericardial effusion is present. Presence of pericardial fat pad. Trivial MR and PR. Mild TR.     Neuro/Psych negative neurological ROS  negative psych ROS   GI/Hepatic negative GI ROS, Neg liver ROS,   Endo/Other  diabetes, Type 2, Oral Hypoglycemic Agents  Renal/GU negative Renal ROS     Musculoskeletal negative musculoskeletal ROS (+)   Abdominal   Peds  Hematology negative hematology ROS (+)   Anesthesia Other Findings   Reproductive/Obstetrics                           Anesthesia Physical Anesthesia Plan  ASA: IV  Anesthesia Plan: General   Post-op Pain Management:    Induction: Intravenous  PONV Risk Score and Plan: 3 and Treatment may vary due to age or medical condition  Airway Management Planned: Oral  ETT  Additional Equipment: Arterial line, CVP, TEE and Ultrasound Guidance Line Placement  Intra-op Plan:   Post-operative Plan: Post-operative intubation/ventilation  Informed Consent: I have reviewed the patients History and Physical, chart, labs and discussed the procedure including the risks, benefits and alternatives for the proposed anesthesia with the patient or authorized representative who has indicated his/her understanding and acceptance.     Dental advisory given  Plan Discussed with: CRNA, Anesthesiologist and Surgeon  Anesthesia Plan Comments: (Flotrac only, no swan )       Anesthesia Quick Evaluation

## 2020-02-07 NOTE — Progress Notes (Signed)
     [address]       [place] [address]             [phone]       No events doing well Ready for surgery tomorrow  CABG4 tomorrow

## 2020-02-07 NOTE — Progress Notes (Addendum)
Progress Note  Patient Name: Bianca CulverMarlyn S Motton Date of Encounter: 02/07/2020  Primary Cardiologist: Nanetta BattyJonathan Berry, MD4/20/2021  Subjective   Doing well today. No complaints. CABG planned for 02/08/20  Inpatient Medications    Scheduled Meds: . amLODipine  10 mg Oral Daily  . aspirin EC  81 mg Oral Daily  . atorvastatin  80 mg Oral Daily  . carvedilol  25 mg Oral BID WC  . enoxaparin (LOVENOX) injection  40 mg Subcutaneous Daily  . insulin aspart  0-5 Units Subcutaneous QHS  . insulin aspart  0-9 Units Subcutaneous TID WC  . isosorbide mononitrate  60 mg Oral Daily  . losartan  100 mg Oral Daily  . polyethylene glycol  17 g Oral Daily  . sodium chloride flush  3 mL Intravenous Once   Continuous Infusions:  PRN Meds: acetaminophen, alum & mag hydroxide-simeth, nitroGLYCERIN, ondansetron (ZOFRAN) IV   Vital Signs    Vitals:   02/06/20 2115 02/06/20 2313 02/06/20 2324 02/07/20 0413  BP: 135/68 (!) 143/77 122/61 122/68  Pulse: 62   76  Resp: 16   18  Temp: 98.2 F (36.8 C)   98.2 F (36.8 C)  TempSrc: Oral   Oral  SpO2: 100%   100%  Weight:    71.8 kg  Height:        Intake/Output Summary (Last 24 hours) at 02/07/2020 0653 Last data filed at 02/06/2020 2000 Gross per 24 hour  Intake 624 ml  Output --  Net 624 ml   Filed Weights   02/04/20 0600 02/05/20 0528 02/07/20 0413  Weight: 70.4 kg 71.1 kg 71.8 kg   Physical Exam   General: Elderly, NAD Skin: Warm, dry, intact  Lungs:Clear to ausculation bilaterally. Breathing is unlabored. Cardiovascular: RRR with S1 S2. No murmurs Abdomen: Soft, non-tender, non-distended. No obvious abdominal masses. Extremities: No edema. Radial pulses 2+ bilaterally Neuro: Alert and oriented. No focal deficits. No facial asymmetry. MAE spontaneously. Psych: Responds to questions appropriately with normal affect.    Labs    Chemistry Recent Labs  Lab 02/02/20 1642 02/02/20 1642 02/03/20 0406 02/04/20 0336  02/05/20 0355  NA 142  --   --  141 141  K 3.3*  --   --  3.4* 4.0  CL 106  --   --  107 106  CO2 29  --   --  30 30  GLUCOSE 157*  --   --  140* 137*  BUN 16  --   --  15 14  CREATININE 0.81   < > 0.90 1.02* 0.98  CALCIUM 8.6*  --   --  8.4* 8.6*  GFRNONAA >60   < > >60 54* 57*  GFRAA >60   < > >60 >60 >60  ANIONGAP 7  --   --  4* 5   < > = values in this interval not displayed.     Hematology Recent Labs  Lab 02/02/20 1642 02/03/20 0406  WBC 6.2 6.2  RBC 4.34 4.54  HGB 11.7* 11.8*  HCT 38.6 39.6  MCV 88.9 87.2  MCH 27.0 26.0  MCHC 30.3 29.8*  RDW 14.1 14.2  PLT 199 184    Cardiac EnzymesNo results for input(s): TROPONINI in the last 168 hours. No results for input(s): TROPIPOC in the last 168 hours.   BNP Recent Labs  Lab 02/02/20 1642  BNP 466.5*     DDimer No results for input(s): DDIMER in the last 168 hours.   Radiology  VAS US DOPPLER PRE CABG  Result Date: 02/06/2020 PREOPERATIVE VASCULAR EVALUATION  Indications:  Pre-CABG. Risk Factors: Hypertension, Diabetes. Performing Technologist: Jeb Levering Rvt, Rdms  Examination Guidelines: A complete evaluation includes B-mode imaging, spectral Doppler, color Doppler, and power Doppler as needed of all accessible portions of each vessel. Bilateral testing is considered an integral part of a complete examination. Limited examinations for reoccurring indications may be performed as noted.  Right Carotid Findings: +----------+--------+--------+--------+------------+--------+           PSV cm/sEDV cm/sStenosisDescribe    Comments +----------+--------+--------+--------+------------+--------+ CCA Prox  81      16                                   +----------+--------+--------+--------+------------+--------+ CCA Distal66      13                                   +----------+--------+--------+--------+------------+--------+ ICA Prox  73      24      1-39%   heterogenous          +----------+--------+--------+--------+------------+--------+ ICA Distal72      22                                   +----------+--------+--------+--------+------------+--------+ ECA       148     22                                   +----------+--------+--------+--------+------------+--------+ Portions of this table do not appear on this page. +----------+--------+-------+----------------+------------+           PSV cm/sEDV cmsDescribe        Arm Pressure +----------+--------+-------+----------------+------------+ Subclavian99             Multiphasic, [id number]          +----------+--------+-------+----------------+------------+ +---------+--------+---+--------+--+---------+ VertebralPSV cm/s104EDV cm/s20Antegrade +---------+--------+---+--------+--+---------+ Left Carotid Findings: +----------+--------+--------+--------+-------------------------+--------+           PSV cm/sEDV cm/sStenosisDescribe                 Comments +----------+--------+--------+--------+-------------------------+--------+ CCA Prox  80      16                                                +----------+--------+--------+--------+-------------------------+--------+ CCA Distal81      22                                                +----------+--------+--------+--------+-------------------------+--------+ ICA Prox  78      16      1-39%   heterogenous and calcific         +----------+--------+--------+--------+-------------------------+--------+ ICA Distal68      23                                                +----------+--------+--------+--------+-------------------------+--------+  ECA       109     9                                                 +----------+--------+--------+--------+-------------------------+--------+ +----------+--------+--------+----------------+------------+ SubclavianPSV cm/sEDV cm/sDescribe        Arm Pressure  +----------+--------+--------+----------------+------------+           93              Multiphasic, WNL150          +----------+--------+--------+----------------+------------+ +---------+--------+--+--------+--+---------+ VertebralPSV cm/s69EDV cm/s21Antegrade +---------+--------+--+--------+--+---------+  ABI Findings: +---------+------------------+-----+----------+-----------+ Right    Rt Pressure (mmHg)IndexWaveform  Comment     +---------+------------------+-----+----------+-----------+ Brachial 157                    triphasic             +---------+------------------+-----+----------+-----------+ ATA      137               0.87 monophasic            +---------+------------------+-----+----------+-----------+ PTA                                       not audible +---------+------------------+-----+----------+-----------+ Great Toe92                0.59 Abnormal              +---------+------------------+-----+----------+-----------+ +---------+------------------+-----+----------+-----------+ Left     Lt Pressure (mmHg)IndexWaveform  Comment     +---------+------------------+-----+----------+-----------+ Brachial 150                    triphasic             +---------+------------------+-----+----------+-----------+ ATA      138               0.88 monophasic            +---------+------------------+-----+----------+-----------+ PTA                                       not audible +---------+------------------+-----+----------+-----------+ Great Toe85                0.54 Abnormal              +---------+------------------+-----+----------+-----------+  Right Doppler Findings: +--------+--------+-----+---------+--------+ Site    PressureIndexDoppler  Comments +--------+--------+-----+---------+--------+ [id number]          triphasic         +--------+--------+-----+---------+--------+ Radial               triphasic          +--------+--------+-----+---------+--------+ Ulnar                biphasic          +--------+--------+-----+---------+--------+  Left Doppler Findings: +--------+--------+-----+---------+--------+ Site    PressureIndexDoppler  Comments +--------+--------+-----+---------+--------+ Brachial150          triphasic         +--------+--------+-----+---------+--------+ Radial               triphasic         +--------+--------+-----+---------+--------+ Ulnar  biphasic          +--------+--------+-----+---------+--------+  Summary: Right Carotid: Velocities in the right ICA are consistent with a 1-39% stenosis. Left Carotid: Velocities in the left ICA are consistent with a 1-39% stenosis. Right ABI: Resting right ankle-brachial index indicates mild right lower extremity arterial disease. The right toe-brachial index is abnormal. Left ABI: Resting left ankle-brachial index indicates mild left lower extremity arterial disease. The left toe-brachial index is abnormal. Right Upper Extremity: Doppler waveform obliterate with right radial compression. Doppler waveforms remain within normal limits with right ulnar compression. Left Upper Extremity: Doppler waveform obliterate with left radial compression. Doppler waveforms remain within normal limits with left ulnar compression.  Electronically signed by Servando Snare MD on 02/06/2020 at 8:22:34 PM.    Final    Telemetry    02/08/20 NSR- Personally Reviewed  ECG    No new tracing as of 02/07/20- Personally Reviewed  Cardiac Studies   ECHO:01/06/2020 1. Left ventricular ejection fraction, by estimation, is 50 to 55%. The  left ventricle has low normal function. The left ventricle has no regional  wall motion abnormalities. There is mild concentric left ventricular  hypertrophy. Indeterminate diastolic filling due to E-A fusion.  2. Right ventricular systolic function is normal. The right ventricular  size is normal.  Tricuspid regurgitation signal is inadequate for assessing  PA pressure.  3. The mitral valve is grossly normal. Trivial mitral valve  regurgitation.  4. The aortic valve is tricuspid. Aortic valve regurgitation is not  visualized. No aortic stenosis is present.  5. The inferior vena cava is normal in size with greater than 50%  respiratory variability, suggesting right atrial pressure of 3 mmHg.   CATH:01/31/2020 Diagnostic Dominance: Left  IMPRESSION:Bianca [name] has a left dominant system with high-grade disease in her proximal LAD, first and second diagonal branches, ramus branch, circumflex obtuse marginal branch and distal circumflex. She has low normal LV function by recent 2D echo. Her anatomy is suitable for CABG. Sheath was removed and a TR band was placed on the right wrist to achieve patent hemostasis. The patient left lab in stable condition. T CTS has been consulted  Patient Profile     74 y.o. female with a hx of hypertensive urgency and acute pulmonary edemawith NSTEMI03/2021,HTN,HLD, DM,tobacco abuse, CAD, RBBB,who is being seen today for the evaluation ofUSAPat the request of Dr Cathlean Sauer.  Assessment & Plan    1.CAD: -Recurrent chest pain consistent with unstable angina. Recently admitted for non-STEMI with LHC revealing three-vessel disease at which time CV surgery with bypass revascularization was recommended however patient initially refused. She was readmitted the following day with recurrent angina>>now agreeable to proceed with CABG>>plan for CABG tomorrow 02/08/20 -Continue current regimen with ASA, high intensity statin, beta-blocker -CV surgery with plans for CABG this week>>02/08/20  2. HTN: -Stable, 122/68>122/61 -Amlodipine 10, carvedilol 25, Imdur 60, losartan 100 -Creatinine stable  3. HLD: -Last LDL, 152 with agoal LDL < 70 -Continue Lipitor 80 mg -Pete lipid panel, LFTs in 6 to 8 weeks   Signed, Kathyrn Drown  NP-C [facility] Pager: [phone] 02/07/2020, 6:53 AM     For questions or updates, please contact   Please consult [email or web address] for contact info under Cardiology/STEMI.   Attending Note:   The patient was seen and examined.  Agree with assessment and plan as noted above.  Changes made to the above note as needed.  Patient seen and independently examined with  Kathyrn Drown, NP .   We discussed all  aspects of the encounter. I agree with the assessment and plan as stated above.  1.   CAD :  Plan is for CABG tomorrow .  No angian   2.  HTN:   BP is stable  3. Hyperlipidemia:   Cont atorvastatin     I have spent a total of 40 minutes with patient reviewing hospital  notes , telemetry, EKGs, labs and examining patient as well as establishing an assessment and plan that was discussed with the patient. > 50% of time was spent in direct patient care.   Vesta Mixer, Montez Hageman., MD, [facility] 02/07/2020, 10:42 AM [address]  [address] Office [phone] Pager [phone]

## 2020-02-07 NOTE — Progress Notes (Signed)
CARDIAC REHAB PHASE I   PRE:  Rate/Rhythm: 66 SR  Up in room   MODE:  Ambulation: 200 ft   POST:  Rate/Rhythm: 99 SR  BP:  Supine:   Sitting: 115/78  Standing:    SaO2: 97%RA 1411-1442 Pt walked 200 ft on RA with hand held asst with no CP.  Pt given [facility] booklet, care guide and IS. Pt demonstrated 750 ml on IS correctly with instruction. Wrote down how to view pre op video and explained to pt and daughter. Son will be available to stay with pt after discharge to assist with care. Discussed importance of IS and walking after surgery. Reviewed sternal precautions. Will follow up after surgery.   Luetta Nutting, RN BSN  02/07/2020 2:38 PM

## 2020-02-07 NOTE — Progress Notes (Signed)
RT NOTES: ABG obtained and sent to lab. Lab tech [facility] notified.

## 2020-02-07 NOTE — Progress Notes (Signed)
PROGRESS NOTE    Bianca [name]  OHY:[id number] DOB: Oct 14, 1945 DOA: 02/02/2020 PCP: Julian Hy, PA-C   Brief Narrative:  74 year old female who presented with chest pain, she does have significant past medical history for hypertension, dyslipidemia, type 2 diabetes mellitus, tobacco abuse and coronary artery disease.  Patient diagnosed with unstable angina.  Left heart catheterization showed multivessel disease requiring CABG.  At this time it is planned for 4/30.   Assessment & Plan:   Principal Problem:   Unstable angina (HCC) Active Problems:   Type 2 diabetes mellitus (HCC)   Essential hypertension   Hyperlipidemia   CAD (coronary artery disease)   Hypokalemia   Chest pain, unstable angina. Currently patient is chest pain-free.  Seen by cardiology team.  Left heart cath showed multivessel disease who will require CABG for revascularization.  Plans for CABG on 4/30 Continue aspirin, statin and beta-blocker.   Uncontrolled T2DM (Hgb A1c 9,0). Sliding scale and Accu-Chek.  Essential hypertension Norvasc 10 mg daily, Coreg 25 mg twice daily, isosorbide mononitrate 60 mg daily, losartan 100 mg daily  Dyslipidemia. Lipitor 80 mg daily   DVT prophylaxis: Lovenox Code Status: Full Family Communication: None Disposition Plan:   Patient From= home  Patient Anticipated D/C place= to be determined  Barriers= maintain hospital stay for CABG.   Subjective: Feels okay no complaints  Review of Systems Otherwise negative except as per HPI, including: General: Denies fever, chills, night sweats or unintended weight loss. Resp: Denies cough, wheezing, shortness of breath. Cardiac: Denies chest pain, palpitations, orthopnea, paroxysmal nocturnal dyspnea. GI: Denies abdominal pain, nausea, vomiting, diarrhea or constipation GU: Denies dysuria, frequency, hesitancy or incontinence MS: Denies muscle aches, joint pain or swelling Neuro: Denies headache,  neurologic deficits (focal weakness, numbness, tingling), abnormal gait Psych: Denies anxiety, depression, SI/HI/AVH Skin: Denies new rashes or lesions ID: Denies sick contacts, exotic exposures, travel  Examination:  Constitutional: Not in acute distress Respiratory: Clear to auscultation bilaterally Cardiovascular: Normal sinus rhythm, no rubs Abdomen: Nontender nondistended good bowel sounds Musculoskeletal: No edema noted Skin: No rashes seen Neurologic: CN 2-12 grossly intact.  And nonfocal Psychiatric: Normal judgment and insight. Alert and oriented x 3. Normal mood.  Objective: Vitals:   02/06/20 2313 02/06/20 2324 02/07/20 0413 02/07/20 0800  BP: (!) 143/77 122/61 122/68 (!) 140/57  Pulse:   76   Resp:   18   Temp:   98.2 F (36.8 C)   TempSrc:   Oral   SpO2:   100%   Weight:   71.8 kg   Height:        Intake/Output Summary (Last 24 hours) at 02/07/2020 1129 Last data filed at 02/07/2020 0800 Gross per 24 hour  Intake 624 ml  Output --  Net 624 ml   Filed Weights   02/04/20 0600 02/05/20 0528 02/07/20 0413  Weight: 70.4 kg 71.1 kg 71.8 kg     Data Reviewed:   CBC: Recent Labs  Lab 02/02/20 1642 02/03/20 0406  WBC 6.2 6.2  HGB 11.7* 11.8*  HCT 38.6 39.6  MCV 88.9 87.2  PLT 199 948   Basic Metabolic Panel: Recent Labs  Lab 02/02/20 1642 02/03/20 0406 02/04/20 0336 02/05/20 0355  NA 142  --  141 141  K 3.3*  --  3.4* 4.0  CL 106  --  107 106  CO2 29  --  30 30  GLUCOSE 157*  --  140* 137*  BUN 16  --  15 14  CREATININE 0.81 0.90  1.02* 0.98  CALCIUM 8.6*  --  8.4* 8.6*   GFR: Estimated Creatinine Clearance: 51.1 mL/min (by C-G formula based on SCr of 0.98 mg/dL). Liver Function Tests: No results for input(s): AST, ALT, ALKPHOS, BILITOT, PROT, ALBUMIN in the last 168 hours. No results for input(s): LIPASE, AMYLASE in the last 168 hours. No results for input(s): AMMONIA in the last 168 hours. Coagulation Profile: No results for input(s):  INR, PROTIME in the last 168 hours. Cardiac Enzymes: No results for input(s): CKTOTAL, CKMB, CKMBINDEX, TROPONINI in the last 168 hours. BNP (last 3 results) No results for input(s): PROBNP in the last 8760 hours. HbA1C: No results for input(s): HGBA1C in the last 72 hours. CBG: Recent Labs  Lab 02/06/20 1133 02/06/20 1613 02/06/20 2101 02/07/20 0735 02/07/20 1104  GLUCAP 163* 111* 207* 135* 154*   Lipid Profile: No results for input(s): CHOL, HDL, LDLCALC, TRIG, CHOLHDL, LDLDIRECT in the last 72 hours. Thyroid Function Tests: No results for input(s): TSH, T4TOTAL, FREET4, T3FREE, THYROIDAB in the last 72 hours. Anemia Panel: No results for input(s): VITAMINB12, FOLATE, FERRITIN, TIBC, IRON, RETICCTPCT in the last 72 hours. Sepsis Labs: No results for input(s): PROCALCITON, LATICACIDVEN in the last 168 hours.  Recent Results (from the past 240 hour(s))  SARS CORONAVIRUS 2 (TAT 6-24 HRS) Nasopharyngeal Nasopharyngeal Swab     Status: None   Collection Time: 01/29/20 12:54 PM   Specimen: Nasopharyngeal Swab  Result Value Ref Range Status   SARS Coronavirus 2 NEGATIVE NEGATIVE Final    Comment: (NOTE) SARS-CoV-2 target nucleic acids are NOT DETECTED. The SARS-CoV-2 RNA is generally detectable in upper and lower respiratory specimens during the acute phase of infection. Negative results do not preclude SARS-CoV-2 infection, do not rule out co-infections with other pathogens, and should not be used as the sole basis for treatment or other patient management decisions. Negative results must be combined with clinical observations, patient history, and epidemiological information. The expected result is Negative. Fact Sheet for Patients: HairSlick.no Fact Sheet for Healthcare Providers: quierodirigir.com This test is not yet approved or cleared by the [place] FDA and  has been authorized for detection and/or diagnosis  of SARS-CoV-2 by FDA under an Emergency Use Authorization (EUA). This EUA will remain  in effect (meaning this test can be used) for the duration of the COVID-19 declaration under Section 56 4(b)(1) of the Act, 21 U.S.C. section 360bbb-3(b)(1), unless the authorization is terminated or revoked sooner. Performed at [facility], [address] [place], [place] [address]   Respiratory Panel by RT PCR (Flu A&B, Covid) - Nasopharyngeal Swab     Status: None   Collection Time: 02/03/20 12:50 AM   Specimen: Nasopharyngeal Swab  Result Value Ref Range Status   SARS Coronavirus 2 by RT PCR NEGATIVE NEGATIVE Final    Comment: (NOTE) SARS-CoV-2 target nucleic acids are NOT DETECTED. The SARS-CoV-2 RNA is generally detectable in upper respiratoy specimens during the acute phase of infection. The lowest concentration of SARS-CoV-2 viral copies this assay can detect is 131 copies/mL. A negative result does not preclude SARS-Cov-2 infection and should not be used as the sole basis for treatment or other patient management decisions. A negative result may occur with  improper specimen collection/handling, submission of specimen other than nasopharyngeal swab, presence of viral mutation(s) within the areas targeted by this assay, and inadequate number of viral copies (<131 copies/mL). A negative result must be combined with clinical observations, patient history, and epidemiological information. The expected result  is Negative. Fact Sheet for Patients:  https://www.moore.com/ Fact Sheet for Healthcare Providers:  https://www.young.biz/ This test is not yet ap proved or cleared by the [place] FDA and  has been authorized for detection and/or diagnosis of SARS-CoV-2 by FDA under an Emergency Use Authorization (EUA). This EUA will remain  in effect (meaning this test can be used) for the duration of the COVID-19 declaration under Section 564(b)(1)  of the Act, 21 U.S.C. section 360bbb-3(b)(1), unless the authorization is terminated or revoked sooner.    Influenza A by PCR NEGATIVE NEGATIVE Final   Influenza B by PCR NEGATIVE NEGATIVE Final    Comment: (NOTE) The Xpert Xpress SARS-CoV-2/FLU/RSV assay is intended as an aid in  the diagnosis of influenza from Nasopharyngeal swab specimens and  should not be used as a sole basis for treatment. Nasal washings and  aspirates are unacceptable for Xpert Xpress SARS-CoV-2/FLU/RSV  testing. Fact Sheet for Patients: https://www.moore.com/ Fact Sheet for Healthcare Providers: https://www.young.biz/ This test is not yet approved or cleared by the [place] FDA and  has been authorized for detection and/or diagnosis of SARS-CoV-2 by  FDA under an Emergency Use Authorization (EUA). This EUA will remain  in effect (meaning this test can be used) for the duration of the  Covid-19 declaration under Section 564(b)(1) of the Act, 21  U.S.C. section 360bbb-3(b)(1), unless the authorization is  terminated or revoked. Performed at [facility], [address] [place], [place] [address]          Radiology Studies: VAS US DOPPLER PRE CABG  Result Date: 02/06/2020 PREOPERATIVE VASCULAR EVALUATION  Indications:  Pre-CABG. Risk Factors: Hypertension, Diabetes. Performing Technologist: Jeb Levering Rvt, Rdms  Examination Guidelines: A complete evaluation includes B-mode imaging, spectral Doppler, color Doppler, and power Doppler as needed of all accessible portions of each vessel. Bilateral testing is considered an integral part of a complete examination. Limited examinations for reoccurring indications may be performed as noted.  Right Carotid Findings: +----------+--------+--------+--------+------------+--------+           PSV cm/sEDV cm/sStenosisDescribe    Comments +----------+--------+--------+--------+------------+--------+ CCA Prox  81       16                                   +----------+--------+--------+--------+------------+--------+ CCA Distal66      13                                   +----------+--------+--------+--------+------------+--------+ ICA Prox  73      24      1-39%   heterogenous         +----------+--------+--------+--------+------------+--------+ ICA Distal72      22                                   +----------+--------+--------+--------+------------+--------+ ECA       148     22                                   +----------+--------+--------+--------+------------+--------+ Portions of this table do not appear on this page. +----------+--------+-------+----------------+------------+           PSV cm/sEDV cmsDescribe        Arm Pressure +----------+--------+-------+----------------+------------+  Subclavian99             Multiphasic, [id number]          +----------+--------+-------+----------------+------------+ +---------+--------+---+--------+--+---------+ VertebralPSV cm/s104EDV cm/s20Antegrade +---------+--------+---+--------+--+---------+ Left Carotid Findings: +----------+--------+--------+--------+-------------------------+--------+           PSV cm/sEDV cm/sStenosisDescribe                 Comments +----------+--------+--------+--------+-------------------------+--------+ CCA Prox  80      16                                                +----------+--------+--------+--------+-------------------------+--------+ CCA Distal81      22                                                +----------+--------+--------+--------+-------------------------+--------+ ICA Prox  78      16      1-39%   heterogenous and calcific         +----------+--------+--------+--------+-------------------------+--------+ ICA Distal68      23                                                +----------+--------+--------+--------+-------------------------+--------+ ECA        109     9                                                 +----------+--------+--------+--------+-------------------------+--------+ +----------+--------+--------+----------------+------------+ SubclavianPSV cm/sEDV cm/sDescribe        Arm Pressure +----------+--------+--------+----------------+------------+           93              Multiphasic, WNL150          +----------+--------+--------+----------------+------------+ +---------+--------+--+--------+--+---------+ VertebralPSV cm/s69EDV cm/s21Antegrade +---------+--------+--+--------+--+---------+  ABI Findings: +---------+------------------+-----+----------+-----------+ Right    Rt Pressure (mmHg)IndexWaveform  Comment     +---------+------------------+-----+----------+-----------+ Brachial 157                    triphasic             +---------+------------------+-----+----------+-----------+ ATA      137               0.87 monophasic            +---------+------------------+-----+----------+-----------+ PTA                                       not audible +---------+------------------+-----+----------+-----------+ Great Toe92                0.59 Abnormal              +---------+------------------+-----+----------+-----------+ +---------+------------------+-----+----------+-----------+ Left     Lt Pressure (mmHg)IndexWaveform  Comment     +---------+------------------+-----+----------+-----------+ Brachial 150                    triphasic             +---------+------------------+-----+----------+-----------+ ATA  138               0.88 monophasic            +---------+------------------+-----+----------+-----------+ PTA                                       not audible +---------+------------------+-----+----------+-----------+ Great Toe85                0.54 Abnormal              +---------+------------------+-----+----------+-----------+  Right Doppler Findings:  +--------+--------+-----+---------+--------+ Site    PressureIndexDoppler  Comments +--------+--------+-----+---------+--------+ [id number]          triphasic         +--------+--------+-----+---------+--------+ Radial               triphasic         +--------+--------+-----+---------+--------+ Ulnar                biphasic          +--------+--------+-----+---------+--------+  Left Doppler Findings: +--------+--------+-----+---------+--------+ Site    PressureIndexDoppler  Comments +--------+--------+-----+---------+--------+ Brachial150          triphasic         +--------+--------+-----+---------+--------+ Radial               triphasic         +--------+--------+-----+---------+--------+ Ulnar                biphasic          +--------+--------+-----+---------+--------+  Summary: Right Carotid: Velocities in the right ICA are consistent with a 1-39% stenosis. Left Carotid: Velocities in the left ICA are consistent with a 1-39% stenosis. Right ABI: Resting right ankle-brachial index indicates mild right lower extremity arterial disease. The right toe-brachial index is abnormal. Left ABI: Resting left ankle-brachial index indicates mild left lower extremity arterial disease. The left toe-brachial index is abnormal. Right Upper Extremity: Doppler waveform obliterate with right radial compression. Doppler waveforms remain within normal limits with right ulnar compression. Left Upper Extremity: Doppler waveform obliterate with left radial compression. Doppler waveforms remain within normal limits with left ulnar compression.  Electronically signed by Lemar LivingsBrandon Cain MD on 02/06/2020 at 8:22:34 PM.    Final         Scheduled Meds: . amLODipine  10 mg Oral Daily  . aspirin EC  81 mg Oral Daily  . atorvastatin  80 mg Oral Daily  . carvedilol  25 mg Oral BID WC  . enoxaparin (LOVENOX) injection  40 mg Subcutaneous Daily  . [START ON 02/08/2020] epinephrine  0-10  mcg/min Intravenous To OR  . [START ON 02/08/2020] heparin-papaverine-plasmalyte irrigation   Irrigation To OR  . insulin aspart  0-5 Units Subcutaneous QHS  . insulin aspart  0-9 Units Subcutaneous TID WC  . [START ON 02/08/2020] insulin   Intravenous To OR  . isosorbide mononitrate  60 mg Oral Daily  . [START ON 02/08/2020] [place] Blood Cardioplegia vial (lidocaine/magnesium/mannitol 0.26g-4g-6.4g)   Intracoronary To OR  . losartan  100 mg Oral Daily  . [START ON 02/08/2020] phenylephrine  30-200 mcg/min Intravenous To OR  . polyethylene glycol  17 g Oral Daily  . [START ON 02/08/2020] potassium chloride  80 mEq Other To OR  . sodium chloride flush  3 mL Intravenous Once  . [START ON 02/08/2020] tranexamic acid  15 mg/kg Intravenous To OR  . [START ON 02/08/2020]  tranexamic acid  2 mg/kg Intracatheter To OR   Continuous Infusions: . [START ON 02/08/2020] cefUROXime (ZINACEF)  IV    . [START ON 02/08/2020] cefUROXime (ZINACEF)  IV    . [START ON 02/08/2020] dexmedetomidine    . [START ON 02/08/2020] heparin 30,000 units/NS 1000 mL solution for CELLSAVER    . [START ON 02/08/2020] milrinone    . [START ON 02/08/2020] nitroGLYCERIN    . [START ON 02/08/2020] norepinephrine    . [START ON 02/08/2020] tranexamic acid (CYKLOKAPRON) infusion ([facility])    . [START ON 02/08/2020] vancomycin       LOS: 4 days   Time spent= 25 mins    Demarrio Menges Joline Maxcy, MD [facility]  If 7PM-7AM, please contact night-coverage  02/07/2020, 11:29 AM

## 2020-02-08 ENCOUNTER — Inpatient Hospital Stay (HOSPITAL_COMMUNITY): Payer: Medicare Other

## 2020-02-08 ENCOUNTER — Inpatient Hospital Stay (HOSPITAL_COMMUNITY)
Admission: EM | Disposition: A | Discharge status: Home Health | Payer: Self-pay | Source: Home / Self Care | Attending: Thoracic Surgery (Cardiothoracic Vascular Surgery)

## 2020-02-08 ENCOUNTER — Inpatient Hospital Stay (HOSPITAL_COMMUNITY): Payer: Medicare Other | Admitting: Certified Registered Nurse Anesthetist

## 2020-02-08 DIAGNOSIS — I25110 Atherosclerotic heart disease of native coronary artery with unstable angina pectoris: Secondary | ICD-10-CM | POA: Diagnosis not present

## 2020-02-08 DIAGNOSIS — I2510 Atherosclerotic heart disease of native coronary artery without angina pectoris: Secondary | ICD-10-CM | POA: Diagnosis present

## 2020-02-08 DIAGNOSIS — Z951 Presence of aortocoronary bypass graft: Secondary | ICD-10-CM

## 2020-02-08 HISTORY — PX: CORONARY ARTERY BYPASS GRAFT: SHX141

## 2020-02-08 HISTORY — PX: TEE WITHOUT CARDIOVERSION: SHX5443

## 2020-02-08 LAB — BASIC METABOLIC PANEL
Anion gap: 10 (ref 5–15)
Anion gap: 7 (ref 5–15)
BUN: 19 mg/dL (ref 8–23)
BUN: 19 mg/dL (ref 8–23)
CO2: 26 mmol/L (ref 22–32)
CO2: 21 mmol/L — ABNORMAL LOW (ref 22–32)
Calcium: 8.8 mg/dL — ABNORMAL LOW (ref 8.9–10.3)
Calcium: 7.8 mg/dL — ABNORMAL LOW (ref 8.9–10.3)
Chloride: 104 mmol/L (ref 98–111)
Chloride: 111 mmol/L (ref 98–111)
Creatinine, Ser: 1.18 mg/dL — ABNORMAL HIGH (ref 0.44–1.00)
Creatinine, Ser: 0.92 mg/dL (ref 0.44–1.00)
GFR calc Af Amer: 53 mL/min — ABNORMAL LOW (ref >60)
GFR calc Af Amer: >60 mL/min (ref >60)
GFR calc non Af Amer: 45 mL/min — ABNORMAL LOW (ref >60)
GFR calc non Af Amer: >60 mL/min (ref >60)
Glucose, Bld: 147 mg/dL — ABNORMAL HIGH (ref 70–99)
Glucose, Bld: 113 mg/dL — ABNORMAL HIGH (ref 70–99)
Potassium: 4.3 mmol/L (ref 3.5–5.1)
Potassium: 4.6 mmol/L (ref 3.5–5.1)
Sodium: 140 mmol/L (ref 135–145)
Sodium: 139 mmol/L (ref 135–145)

## 2020-02-08 LAB — POCT I-STAT 7, (LYTES, BLD GAS, ICA,H+H)
Acid-Base Excess: 1 mmol/L (ref 0.0–2.0)
Acid-Base Excess: 7 mmol/L — ABNORMAL HIGH (ref 0.0–2.0)
Acid-Base Excess: 0 mmol/L (ref 0.0–2.0)
Acid-Base Excess: 4 mmol/L — ABNORMAL HIGH (ref 0.0–2.0)
Acid-base deficit: 1 mmol/L (ref 0.0–2.0)
Acid-base deficit: 4 mmol/L — ABNORMAL HIGH (ref 0.0–2.0)
Acid-base deficit: 4 mmol/L — ABNORMAL HIGH (ref 0.0–2.0)
Bicarbonate: 26.8 mmol/L (ref 20.0–28.0)
Bicarbonate: 30.7 mmol/L — ABNORMAL HIGH (ref 20.0–28.0)
Bicarbonate: 23.7 mmol/L (ref 20.0–28.0)
Bicarbonate: 27.9 mmol/L (ref 20.0–28.0)
Bicarbonate: 23.2 mmol/L (ref 20.0–28.0)
Bicarbonate: 21.4 mmol/L (ref 20.0–28.0)
Bicarbonate: 21.7 mmol/L (ref 20.0–28.0)
Calcium, Ion: 1.21 mmol/L (ref 1.15–1.40)
Calcium, Ion: 0.95 mmol/L — ABNORMAL LOW (ref 1.15–1.40)
Calcium, Ion: 1.02 mmol/L — ABNORMAL LOW (ref 1.15–1.40)
Calcium, Ion: 1.27 mmol/L (ref 1.15–1.40)
Calcium, Ion: 1.21 mmol/L (ref 1.15–1.40)
Calcium, Ion: 1.17 mmol/L (ref 1.15–1.40)
Calcium, Ion: 1.17 mmol/L (ref 1.15–1.40)
HCT: 36 % (ref 36.0–46.0)
HCT: 25 % — ABNORMAL LOW (ref 36.0–46.0)
HCT: 27 % — ABNORMAL LOW (ref 36.0–46.0)
HCT: 23 % — ABNORMAL LOW (ref 36.0–46.0)
HCT: 29 % — ABNORMAL LOW (ref 36.0–46.0)
HCT: 25 % — ABNORMAL LOW (ref 36.0–46.0)
HCT: 25 % — ABNORMAL LOW (ref 36.0–46.0)
Hemoglobin: 12.2 g/dL (ref 12.0–15.0)
Hemoglobin: 8.5 g/dL — ABNORMAL LOW (ref 12.0–15.0)
Hemoglobin: 9.2 g/dL — ABNORMAL LOW (ref 12.0–15.0)
Hemoglobin: 7.8 g/dL — ABNORMAL LOW (ref 12.0–15.0)
Hemoglobin: 9.9 g/dL — ABNORMAL LOW (ref 12.0–15.0)
Hemoglobin: 8.5 g/dL — ABNORMAL LOW (ref 12.0–15.0)
Hemoglobin: 8.5 g/dL — ABNORMAL LOW (ref 12.0–15.0)
O2 Saturation: 100 %
O2 Saturation: 100 %
O2 Saturation: 100 %
O2 Saturation: 100 %
O2 Saturation: 99 %
O2 Saturation: 99 %
O2 Saturation: 99 %
Patient temperature: 98.6
Patient temperature: 37.1
Patient temperature: 97.7
Potassium: 4.1 mmol/L (ref 3.5–5.1)
Potassium: 3.9 mmol/L (ref 3.5–5.1)
Potassium: 4.1 mmol/L (ref 3.5–5.1)
Potassium: 4.1 mmol/L (ref 3.5–5.1)
Potassium: 4 mmol/L (ref 3.5–5.1)
Potassium: 4.3 mmol/L (ref 3.5–5.1)
Potassium: 4.3 mmol/L (ref 3.5–5.1)
Sodium: 139 mmol/L (ref 135–145)
Sodium: 144 mmol/L (ref 135–145)
Sodium: 141 mmol/L (ref 135–145)
Sodium: 142 mmol/L (ref 135–145)
Sodium: 143 mmol/L (ref 135–145)
Sodium: 142 mmol/L (ref 135–145)
Sodium: 140 mmol/L (ref 135–145)
TCO2: 28 mmol/L (ref 22–32)
TCO2: 32 mmol/L (ref 22–32)
TCO2: 25 mmol/L (ref 22–32)
TCO2: 29 mmol/L (ref 22–32)
TCO2: 24 mmol/L (ref 22–32)
TCO2: 23 mmol/L (ref 22–32)
TCO2: 23 mmol/L (ref 22–32)
pCO2 arterial: 44.6 mmHg (ref 32.0–48.0)
pCO2 arterial: 39.7 mmHg (ref 32.0–48.0)
pCO2 arterial: 32.7 mmHg (ref 32.0–48.0)
pCO2 arterial: 40 mmHg (ref 32.0–48.0)
pCO2 arterial: 35.7 mmHg (ref 32.0–48.0)
pCO2 arterial: 41.2 mmHg (ref 32.0–48.0)
pCO2 arterial: 39.7 mmHg (ref 32.0–48.0)
pH, Arterial: 7.386 (ref 7.350–7.450)
pH, Arterial: 7.497 — ABNORMAL HIGH (ref 7.350–7.450)
pH, Arterial: 7.467 — ABNORMAL HIGH (ref 7.350–7.450)
pH, Arterial: 7.451 — ABNORMAL HIGH (ref 7.350–7.450)
pH, Arterial: 7.421 (ref 7.350–7.450)
pH, Arterial: 7.324 — ABNORMAL LOW (ref 7.350–7.450)
pH, Arterial: 7.342 — ABNORMAL LOW (ref 7.350–7.450)
pO2, Arterial: 260 mmHg — ABNORMAL HIGH (ref 83.0–108.0)
pO2, Arterial: 363 mmHg — ABNORMAL HIGH (ref 83.0–108.0)
pO2, Arterial: 325 mmHg — ABNORMAL HIGH (ref 83.0–108.0)
pO2, Arterial: 253 mmHg — ABNORMAL HIGH (ref 83.0–108.0)
pO2, Arterial: 132 mmHg — ABNORMAL HIGH (ref 83.0–108.0)
pO2, Arterial: 134 mmHg — ABNORMAL HIGH (ref 83.0–108.0)
pO2, Arterial: 146 mmHg — ABNORMAL HIGH (ref 83.0–108.0)

## 2020-02-08 LAB — CBC
HCT: 39.4 % (ref 36.0–46.0)
HCT: 30 % — ABNORMAL LOW (ref 36.0–46.0)
HCT: 27.8 % — ABNORMAL LOW (ref 36.0–46.0)
Hemoglobin: 12 g/dL (ref 12.0–15.0)
Hemoglobin: 9.6 g/dL — ABNORMAL LOW (ref 12.0–15.0)
Hemoglobin: 8.8 g/dL — ABNORMAL LOW (ref 12.0–15.0)
MCH: 26.4 pg (ref 26.0–34.0)
MCH: 28.2 pg (ref 26.0–34.0)
MCH: 27.7 pg (ref 26.0–34.0)
MCHC: 30.5 g/dL (ref 30.0–36.0)
MCHC: 32 g/dL (ref 30.0–36.0)
MCHC: 31.7 g/dL (ref 30.0–36.0)
MCV: 86.8 fL (ref 80.0–100.0)
MCV: 88 fL (ref 80.0–100.0)
MCV: 87.4 fL (ref 80.0–100.0)
Platelets: 168 10*3/uL (ref 150–400)
Platelets: 85 10*3/uL — ABNORMAL LOW (ref 150–400)
Platelets: 81 10*3/uL — ABNORMAL LOW (ref 150–400)
RBC: 4.54 MIL/uL (ref 3.87–5.11)
RBC: 3.41 MIL/uL — ABNORMAL LOW (ref 3.87–5.11)
RBC: 3.18 MIL/uL — ABNORMAL LOW (ref 3.87–5.11)
RDW: 13.9 % (ref 11.5–15.5)
RDW: 13.7 % (ref 11.5–15.5)
RDW: 13.8 % (ref 11.5–15.5)
WBC: 6.7 10*3/uL (ref 4.0–10.5)
WBC: 15.6 10*3/uL — ABNORMAL HIGH (ref 4.0–10.5)
WBC: 14.3 10*3/uL — ABNORMAL HIGH (ref 4.0–10.5)
nRBC: 0 % (ref 0.0–0.2)
nRBC: 0 % (ref 0.0–0.2)
nRBC: 0 % (ref 0.0–0.2)

## 2020-02-08 LAB — POCT I-STAT, CHEM 8
BUN: 18 mg/dL (ref 8–23)
BUN: 15 mg/dL (ref 8–23)
BUN: 18 mg/dL (ref 8–23)
BUN: 15 mg/dL (ref 8–23)
BUN: 17 mg/dL (ref 8–23)
BUN: 17 mg/dL (ref 8–23)
BUN: 18 mg/dL (ref 8–23)
BUN: 17 mg/dL (ref 8–23)
Calcium, Ion: 1.21 mmol/L (ref 1.15–1.40)
Calcium, Ion: 0.98 mmol/L — ABNORMAL LOW (ref 1.15–1.40)
Calcium, Ion: 1.26 mmol/L (ref 1.15–1.40)
Calcium, Ion: 1.02 mmol/L — ABNORMAL LOW (ref 1.15–1.40)
Calcium, Ion: 1.08 mmol/L — ABNORMAL LOW (ref 1.15–1.40)
Calcium, Ion: 0.97 mmol/L — ABNORMAL LOW (ref 1.15–1.40)
Calcium, Ion: 1 mmol/L — ABNORMAL LOW (ref 1.15–1.40)
Calcium, Ion: 1.27 mmol/L (ref 1.15–1.40)
Chloride: 103 mmol/L (ref 98–111)
Chloride: 100 mmol/L (ref 98–111)
Chloride: 104 mmol/L (ref 98–111)
Chloride: 101 mmol/L (ref 98–111)
Chloride: 103 mmol/L (ref 98–111)
Chloride: 102 mmol/L (ref 98–111)
Chloride: 102 mmol/L (ref 98–111)
Chloride: 105 mmol/L (ref 98–111)
Creatinine, Ser: 0.9 mg/dL (ref 0.44–1.00)
Creatinine, Ser: 0.7 mg/dL (ref 0.44–1.00)
Creatinine, Ser: 0.9 mg/dL (ref 0.44–1.00)
Creatinine, Ser: 0.7 mg/dL (ref 0.44–1.00)
Creatinine, Ser: 0.8 mg/dL (ref 0.44–1.00)
Creatinine, Ser: 0.8 mg/dL (ref 0.44–1.00)
Creatinine, Ser: 0.8 mg/dL (ref 0.44–1.00)
Creatinine, Ser: 0.8 mg/dL (ref 0.44–1.00)
Glucose, Bld: 165 mg/dL — ABNORMAL HIGH (ref 70–99)
Glucose, Bld: 109 mg/dL — ABNORMAL HIGH (ref 70–99)
Glucose, Bld: 153 mg/dL — ABNORMAL HIGH (ref 70–99)
Glucose, Bld: 112 mg/dL — ABNORMAL HIGH (ref 70–99)
Glucose, Bld: 116 mg/dL — ABNORMAL HIGH (ref 70–99)
Glucose, Bld: 123 mg/dL — ABNORMAL HIGH (ref 70–99)
Glucose, Bld: 125 mg/dL — ABNORMAL HIGH (ref 70–99)
Glucose, Bld: 126 mg/dL — ABNORMAL HIGH (ref 70–99)
HCT: 35 % — ABNORMAL LOW (ref 36.0–46.0)
HCT: 30 % — ABNORMAL LOW (ref 36.0–46.0)
HCT: 34 % — ABNORMAL LOW (ref 36.0–46.0)
HCT: 22 % — ABNORMAL LOW (ref 36.0–46.0)
HCT: 24 % — ABNORMAL LOW (ref 36.0–46.0)
HCT: 25 % — ABNORMAL LOW (ref 36.0–46.0)
HCT: 26 % — ABNORMAL LOW (ref 36.0–46.0)
HCT: 26 % — ABNORMAL LOW (ref 36.0–46.0)
Hemoglobin: 11.9 g/dL — ABNORMAL LOW (ref 12.0–15.0)
Hemoglobin: 10.2 g/dL — ABNORMAL LOW (ref 12.0–15.0)
Hemoglobin: 11.6 g/dL — ABNORMAL LOW (ref 12.0–15.0)
Hemoglobin: 7.5 g/dL — ABNORMAL LOW (ref 12.0–15.0)
Hemoglobin: 8.2 g/dL — ABNORMAL LOW (ref 12.0–15.0)
Hemoglobin: 8.5 g/dL — ABNORMAL LOW (ref 12.0–15.0)
Hemoglobin: 8.8 g/dL — ABNORMAL LOW (ref 12.0–15.0)
Hemoglobin: 8.8 g/dL — ABNORMAL LOW (ref 12.0–15.0)
Potassium: 4.2 mmol/L (ref 3.5–5.1)
Potassium: 4 mmol/L (ref 3.5–5.1)
Potassium: 4 mmol/L (ref 3.5–5.1)
Potassium: 4.1 mmol/L (ref 3.5–5.1)
Potassium: 4.3 mmol/L (ref 3.5–5.1)
Potassium: 4.6 mmol/L (ref 3.5–5.1)
Potassium: 4.8 mmol/L (ref 3.5–5.1)
Potassium: 4.1 mmol/L (ref 3.5–5.1)
Sodium: 141 mmol/L (ref 135–145)
Sodium: 140 mmol/L (ref 135–145)
Sodium: 142 mmol/L (ref 135–145)
Sodium: 140 mmol/L (ref 135–145)
Sodium: 141 mmol/L (ref 135–145)
Sodium: 141 mmol/L (ref 135–145)
Sodium: 141 mmol/L (ref 135–145)
Sodium: 141 mmol/L (ref 135–145)
TCO2: 27 mmol/L (ref 22–32)
TCO2: 26 mmol/L (ref 22–32)
TCO2: 29 mmol/L (ref 22–32)
TCO2: 29 mmol/L (ref 22–32)
TCO2: 31 mmol/L (ref 22–32)
TCO2: 28 mmol/L (ref 22–32)
TCO2: 30 mmol/L (ref 22–32)
TCO2: 28 mmol/L (ref 22–32)

## 2020-02-08 LAB — PROTIME-INR
INR: 1.5 — ABNORMAL HIGH (ref 0.8–1.2)
Prothrombin Time: 17.2 seconds — ABNORMAL HIGH (ref 11.4–15.2)

## 2020-02-08 LAB — HEMOGLOBIN AND HEMATOCRIT, BLOOD
HCT: 23.5 % — ABNORMAL LOW (ref 36.0–46.0)
Hemoglobin: 7.4 g/dL — ABNORMAL LOW (ref 12.0–15.0)

## 2020-02-08 LAB — ECHO INTRAOPERATIVE TEE
Height: 66 in
Weight: 2525.24 oz

## 2020-02-08 LAB — GLUCOSE, CAPILLARY
Glucose-Capillary: 135 mg/dL — ABNORMAL HIGH (ref 70–99)
Glucose-Capillary: 108 mg/dL — ABNORMAL HIGH (ref 70–99)
Glucose-Capillary: 126 mg/dL — ABNORMAL HIGH (ref 70–99)
Glucose-Capillary: 103 mg/dL — ABNORMAL HIGH (ref 70–99)
Glucose-Capillary: 103 mg/dL — ABNORMAL HIGH (ref 70–99)
Glucose-Capillary: 100 mg/dL — ABNORMAL HIGH (ref 70–99)
Glucose-Capillary: 100 mg/dL — ABNORMAL HIGH (ref 70–99)
Glucose-Capillary: 97 mg/dL (ref 70–99)
Glucose-Capillary: 85 mg/dL (ref 70–99)
Glucose-Capillary: 136 mg/dL — ABNORMAL HIGH (ref 70–99)

## 2020-02-08 LAB — PREPARE RBC (CROSSMATCH)

## 2020-02-08 LAB — MAGNESIUM: Magnesium: 3.6 mg/dL — ABNORMAL HIGH (ref 1.7–2.4)

## 2020-02-08 LAB — PLATELET COUNT: Platelets: 79 10*3/uL — ABNORMAL LOW (ref 150–400)

## 2020-02-08 LAB — APTT: aPTT: 34 seconds (ref 24–36)

## 2020-02-08 SURGERY — CORONARY ARTERY BYPASS GRAFTING (CABG)
Anesthesia: General | Site: Esophagus

## 2020-02-08 MED ORDER — LACTATED RINGERS IV SOLN: INTRAVENOUS | Status: DC | PRN

## 2020-02-08 MED ORDER — NICARDIPINE HCL IN NACL 20-0.86 MG/200ML-% IV SOLN
0.0000 mg/h | INTRAVENOUS | Status: DC
  Filled 2020-02-08: qty 200

## 2020-02-08 MED ORDER — PANTOPRAZOLE SODIUM 40 MG PO TBEC
40.0000 mg | DELAYED_RELEASE_TABLET | Freq: Every day | ORAL | Status: DC
  Administered 2020-02-11 – 2020-02-15 (×5): 40 mg via ORAL
  Filled 2020-02-08 (×5): qty 1

## 2020-02-08 MED ORDER — CHLORHEXIDINE GLUCONATE 0.12 % MT SOLN
15.0000 mL | OROMUCOSAL | Status: AC
  Administered 2020-02-08: 15 mL via OROMUCOSAL

## 2020-02-08 MED ORDER — LIDOCAINE 2% (20 MG/ML) 5 ML SYRINGE
INTRAMUSCULAR | Status: AC
  Filled 2020-02-08: qty 5

## 2020-02-08 MED ORDER — MAGNESIUM SULFATE 4 GM/100ML IV SOLN
4.0000 g | Freq: Once | INTRAVENOUS | Status: AC
  Administered 2020-02-08: 4 g via INTRAVENOUS
  Filled 2020-02-08: qty 100

## 2020-02-08 MED ORDER — ACETAMINOPHEN 650 MG RE SUPP
650.0000 mg | Freq: Once | RECTAL | Status: AC
  Administered 2020-02-08: 650 mg via RECTAL

## 2020-02-08 MED ORDER — METOPROLOL TARTRATE 5 MG/5ML IV SOLN
2.5000 mg | INTRAVENOUS | Status: DC | PRN
  Administered 2020-02-14: 2.5 mg via INTRAVENOUS
  Filled 2020-02-08: qty 5

## 2020-02-08 MED ORDER — MIDAZOLAM HCL 5 MG/5ML IJ SOLN
INTRAMUSCULAR | Status: DC | PRN
  Administered 2020-02-08 (×2): 1 mg via INTRAVENOUS

## 2020-02-08 MED ORDER — LACTATED RINGERS IV SOLN: INTRAVENOUS | Status: DC

## 2020-02-08 MED ORDER — BISACODYL 5 MG PO TBEC
10.0000 mg | DELAYED_RELEASE_TABLET | Freq: Every day | ORAL | Status: DC
  Administered 2020-02-09 – 2020-02-13 (×5): 10 mg via ORAL
  Filled 2020-02-08 (×6): qty 2

## 2020-02-08 MED ORDER — NITROGLYCERIN 0.2 MG/ML ON CALL CATH LAB
INTRAVENOUS | Status: DC | PRN
Start: 2020-02-08 — End: 2020-02-08
  Administered 2020-02-08: 20 ug via INTRAVENOUS

## 2020-02-08 MED ORDER — POTASSIUM CHLORIDE 10 MEQ/50ML IV SOLN: 10.0000 meq | INTRAVENOUS | Status: AC

## 2020-02-08 MED ORDER — ALBUMIN HUMAN 5 % IV SOLN: INTRAVENOUS | Status: DC | PRN

## 2020-02-08 MED ORDER — SODIUM CHLORIDE 0.9 % IV SOLN: 250.0000 mL | INTRAVENOUS | Status: DC

## 2020-02-08 MED ORDER — ASPIRIN 81 MG PO CHEW
324.0000 mg | CHEWABLE_TABLET | Freq: Every day | ORAL | Status: DC
  Administered 2020-02-10: 324 mg
  Filled 2020-02-08 (×2): qty 4

## 2020-02-08 MED ORDER — DEXTROSE 50 % IV SOLN: 0.0000 mL | INTRAVENOUS | Status: DC | PRN

## 2020-02-08 MED ORDER — SODIUM CHLORIDE 0.9 % IV SOLN
1.5000 g | Freq: Two times a day (BID) | INTRAVENOUS | Status: AC
  Administered 2020-02-08 – 2020-02-10 (×4): 1.5 g via INTRAVENOUS
  Filled 2020-02-08 (×4): qty 1.5

## 2020-02-08 MED ORDER — OXYCODONE HCL 5 MG PO TABS: 5.0000 mg | ORAL_TABLET | ORAL | Status: DC | PRN

## 2020-02-08 MED ORDER — SODIUM CHLORIDE 0.9 % IV SOLN
INTRAVENOUS | Status: DC
  Administered 2020-02-09: 20 mL via INTRAVENOUS

## 2020-02-08 MED ORDER — PROPOFOL 10 MG/ML IV BOLUS
INTRAVENOUS | Status: AC
  Filled 2020-02-08: qty 20

## 2020-02-08 MED ORDER — INSULIN REGULAR(HUMAN) IN NACL 100-0.9 UT/100ML-% IV SOLN: INTRAVENOUS | Status: DC

## 2020-02-08 MED ORDER — VANCOMYCIN HCL IN DEXTROSE 1-5 GM/200ML-% IV SOLN
1000.0000 mg | Freq: Once | INTRAVENOUS | Status: AC
  Administered 2020-02-08: 1000 mg via INTRAVENOUS
  Filled 2020-02-08: qty 200

## 2020-02-08 MED ORDER — PHENYLEPHRINE 40 MCG/ML (10ML) SYRINGE FOR IV PUSH (FOR BLOOD PRESSURE SUPPORT)
PREFILLED_SYRINGE | INTRAVENOUS | Status: DC | PRN
  Administered 2020-02-08 (×10): 40 ug via INTRAVENOUS

## 2020-02-08 MED ORDER — LACTATED RINGERS IV SOLN: 500.0000 mL | Freq: Once | INTRAVENOUS | Status: DC | PRN

## 2020-02-08 MED ORDER — SODIUM CHLORIDE 0.45 % IV SOLN: INTRAVENOUS | Status: DC | PRN

## 2020-02-08 MED ORDER — DEXMEDETOMIDINE HCL IN NACL 400 MCG/100ML IV SOLN
0.0000 ug/kg/h | INTRAVENOUS | Status: DC
  Filled 2020-02-08: qty 100

## 2020-02-08 MED ORDER — ACETAMINOPHEN 160 MG/5ML PO SOLN
1000.0000 mg | Freq: Four times a day (QID) | ORAL | Status: AC
  Filled 2020-02-08: qty 40.6

## 2020-02-08 MED ORDER — METOPROLOL TARTRATE 25 MG/10 ML ORAL SUSPENSION
12.5000 mg | Freq: Two times a day (BID) | ORAL | Status: DC
  Filled 2020-02-08 (×4): qty 5

## 2020-02-08 MED ORDER — 0.9 % SODIUM CHLORIDE (POUR BTL) OPTIME
TOPICAL | Status: DC | PRN
  Administered 2020-02-08: 4000 mL

## 2020-02-08 MED ORDER — ESMOLOL HCL 100 MG/10ML IV SOLN
INTRAVENOUS | Status: DC | PRN
  Administered 2020-02-08 (×4): 20 mg via INTRAVENOUS

## 2020-02-08 MED ORDER — DOCUSATE SODIUM 100 MG PO CAPS
200.0000 mg | ORAL_CAPSULE | Freq: Every day | ORAL | Status: DC
  Administered 2020-02-09 – 2020-02-13 (×4): 200 mg via ORAL
  Filled 2020-02-08 (×4): qty 2

## 2020-02-08 MED ORDER — FAMOTIDINE IN NACL 20-0.9 MG/50ML-% IV SOLN
20.0000 mg | Freq: Two times a day (BID) | INTRAVENOUS | Status: AC
  Administered 2020-02-08: 20 mg via INTRAVENOUS
  Filled 2020-02-08: qty 50

## 2020-02-08 MED ORDER — BISACODYL 10 MG RE SUPP: 10.0000 mg | Freq: Every day | RECTAL | Status: DC

## 2020-02-08 MED ORDER — ROCURONIUM BROMIDE 10 MG/ML (PF) SYRINGE
PREFILLED_SYRINGE | INTRAVENOUS | Status: DC | PRN
  Administered 2020-02-08: 50 mg via INTRAVENOUS
  Administered 2020-02-08: 100 mg via INTRAVENOUS
  Administered 2020-02-08: 10 mg via INTRAVENOUS

## 2020-02-08 MED ORDER — FENTANYL CITRATE (PF) 250 MCG/5ML IJ SOLN
INTRAMUSCULAR | Status: AC
  Filled 2020-02-08: qty 10

## 2020-02-08 MED ORDER — MIDAZOLAM HCL 2 MG/2ML IJ SOLN
2.0000 mg | INTRAMUSCULAR | Status: DC | PRN
  Administered 2020-02-08: 2 mg via INTRAVENOUS
  Filled 2020-02-08: qty 2

## 2020-02-08 MED ORDER — ROCURONIUM BROMIDE 10 MG/ML (PF) SYRINGE
PREFILLED_SYRINGE | INTRAVENOUS | Status: AC
  Filled 2020-02-08: qty 10

## 2020-02-08 MED ORDER — SODIUM CHLORIDE 0.9% FLUSH: 3.0000 mL | Freq: Two times a day (BID) | INTRAVENOUS | Status: DC

## 2020-02-08 MED ORDER — SODIUM CHLORIDE 0.9 % IV SOLN
INTRAVENOUS | Status: DC | PRN
Start: 2020-02-08 — End: 2020-02-08

## 2020-02-08 MED ORDER — DEXAMETHASONE SODIUM PHOSPHATE 10 MG/ML IJ SOLN
INTRAMUSCULAR | Status: AC
  Filled 2020-02-08: qty 1

## 2020-02-08 MED ORDER — HEMOSTATIC AGENTS (NO CHARGE) OPTIME
TOPICAL | Status: DC | PRN
  Administered 2020-02-08: 3 via TOPICAL

## 2020-02-08 MED ORDER — LIDOCAINE 2% (20 MG/ML) 5 ML SYRINGE
INTRAMUSCULAR | Status: DC | PRN
  Administered 2020-02-08: 40 mg via INTRAVENOUS

## 2020-02-08 MED ORDER — SODIUM CHLORIDE 0.9% FLUSH: 3.0000 mL | INTRAVENOUS | Status: DC | PRN

## 2020-02-08 MED ORDER — HEPARIN SODIUM (PORCINE) 1000 UNIT/ML IJ SOLN
INTRAMUSCULAR | Status: DC | PRN
  Administered 2020-02-08: 26000 [IU] via INTRAVENOUS

## 2020-02-08 MED ORDER — FENTANYL CITRATE (PF) 250 MCG/5ML IJ SOLN
INTRAMUSCULAR | Status: DC | PRN
  Administered 2020-02-08: 25 ug via INTRAVENOUS
  Administered 2020-02-08 (×2): 100 ug via INTRAVENOUS
  Administered 2020-02-08: 75 ug via INTRAVENOUS
  Administered 2020-02-08: 200 ug via INTRAVENOUS

## 2020-02-08 MED ORDER — ASPIRIN EC 325 MG PO TBEC
325.0000 mg | DELAYED_RELEASE_TABLET | Freq: Every day | ORAL | Status: DC
  Administered 2020-02-09 – 2020-02-15 (×6): 325 mg via ORAL
  Filled 2020-02-08 (×6): qty 1

## 2020-02-08 MED ORDER — ACETAMINOPHEN 500 MG PO TABS
1000.0000 mg | ORAL_TABLET | Freq: Four times a day (QID) | ORAL | Status: AC
  Administered 2020-02-09 – 2020-02-13 (×10): 1000 mg via ORAL
  Filled 2020-02-08 (×11): qty 2

## 2020-02-08 MED ORDER — MORPHINE SULFATE (PF) 2 MG/ML IV SOLN
1.0000 mg | INTRAVENOUS | Status: DC | PRN
  Administered 2020-02-08 – 2020-02-10 (×7): 2 mg via INTRAVENOUS
  Filled 2020-02-08: qty 1
  Filled 2020-02-08: qty 2
  Filled 2020-02-08: qty 1
  Filled 2020-02-08: qty 2
  Filled 2020-02-08: qty 1

## 2020-02-08 MED ORDER — PROPOFOL 10 MG/ML IV BOLUS
INTRAVENOUS | Status: DC | PRN
  Administered 2020-02-08: 40 mg via INTRAVENOUS
  Administered 2020-02-08: 50 mg via INTRAVENOUS

## 2020-02-08 MED ORDER — ONDANSETRON HCL 4 MG/2ML IJ SOLN
4.0000 mg | Freq: Four times a day (QID) | INTRAMUSCULAR | Status: DC | PRN
  Administered 2020-02-08 – 2020-02-10 (×5): 4 mg via INTRAVENOUS
  Filled 2020-02-08 (×5): qty 2

## 2020-02-08 MED ORDER — SODIUM CHLORIDE 0.9% FLUSH
10.0000 mL | Freq: Two times a day (BID) | INTRAVENOUS | Status: DC
  Administered 2020-02-08: 10 mL
  Administered 2020-02-09: 30 mL
  Administered 2020-02-10 – 2020-02-11 (×3): 10 mL

## 2020-02-08 MED ORDER — ORAL CARE MOUTH RINSE
15.0000 mL | OROMUCOSAL | Status: DC
  Administered 2020-02-08 – 2020-02-09 (×7): 15 mL via OROMUCOSAL

## 2020-02-08 MED ORDER — NITROGLYCERIN IN D5W 200-5 MCG/ML-% IV SOLN: 0.0000 ug/min | INTRAVENOUS | Status: DC

## 2020-02-08 MED ORDER — ALBUMIN HUMAN 5 % IV SOLN
250.0000 mL | INTRAVENOUS | Status: AC | PRN
  Administered 2020-02-08 (×4): 12.5 g via INTRAVENOUS
  Filled 2020-02-08 (×2): qty 250

## 2020-02-08 MED ORDER — PHENYLEPHRINE HCL-NACL 10-0.9 MG/250ML-% IV SOLN: INTRAVENOUS | Status: DC | PRN

## 2020-02-08 MED ORDER — SODIUM CHLORIDE 0.9% FLUSH: 10.0000 mL | INTRAVENOUS | Status: DC | PRN

## 2020-02-08 MED ORDER — METOPROLOL TARTRATE 12.5 MG HALF TABLET
12.5000 mg | ORAL_TABLET | Freq: Two times a day (BID) | ORAL | Status: DC
  Administered 2020-02-09 – 2020-02-13 (×6): 12.5 mg via ORAL
  Filled 2020-02-08 (×9): qty 1

## 2020-02-08 MED ORDER — PROTAMINE SULFATE 10 MG/ML IV SOLN
INTRAVENOUS | Status: DC | PRN
  Administered 2020-02-08: 230 mg via INTRAVENOUS
  Administered 2020-02-08: 30 mg via INTRAVENOUS

## 2020-02-08 MED ORDER — ONDANSETRON HCL 4 MG/2ML IJ SOLN
INTRAMUSCULAR | Status: AC
  Filled 2020-02-08: qty 2

## 2020-02-08 MED ORDER — CHLORHEXIDINE GLUCONATE 0.12% ORAL RINSE (MEDLINE KIT)
15.0000 mL | Freq: Two times a day (BID) | OROMUCOSAL | Status: DC
  Administered 2020-02-08 – 2020-02-09 (×2): 15 mL via OROMUCOSAL

## 2020-02-08 MED ORDER — MIDAZOLAM HCL 2 MG/2ML IJ SOLN
INTRAMUSCULAR | Status: AC
  Filled 2020-02-08: qty 2

## 2020-02-08 MED ORDER — PLASMA-LYTE 148 IV SOLN
INTRAVENOUS | Status: DC | PRN
  Administered 2020-02-08: 500 mL via INTRAVASCULAR

## 2020-02-08 MED ORDER — PHENYLEPHRINE HCL-NACL 20-0.9 MG/250ML-% IV SOLN
0.0000 ug/min | INTRAVENOUS | Status: AC
  Administered 2020-02-08 – 2020-02-09 (×3): 50 ug/min via INTRAVENOUS
  Administered 2020-02-10: 63 ug/min via INTRAVENOUS
  Administered 2020-02-10: 50 ug/min via INTRAVENOUS
  Administered 2020-02-10: 65 ug/min via INTRAVENOUS
  Administered 2020-02-10: 60 ug/min via INTRAVENOUS
  Filled 2020-02-08 (×8): qty 250

## 2020-02-08 MED ORDER — ACETAMINOPHEN 160 MG/5ML PO SOLN: 650.0000 mg | Freq: Once | ORAL | Status: AC

## 2020-02-08 SURGICAL SUPPLY — 91 items
ADAPTER MULTI PERFUSION 15 (ADAPTER) ×4 IMPLANT
BAG DECANTER FOR FLEXI CONT (MISCELLANEOUS) ×4 IMPLANT
BASKET HEART  (ORDER IN 25'S) (MISCELLANEOUS) ×1
BASKET HEART (ORDER IN 25'S) (MISCELLANEOUS) ×3
BASKET HEART (ORDER IN 25S) (MISCELLANEOUS) ×2 IMPLANT
BLADE CLIPPER SURG (BLADE) ×4 IMPLANT
BLADE STERNUM SYSTEM 6 (BLADE) ×4 IMPLANT
BLADE SURG 11 STRL SS (BLADE) ×2 IMPLANT
BNDG ELASTIC 4X5.8 VLCR STR LF (GAUZE/BANDAGES/DRESSINGS) ×4 IMPLANT
BNDG ELASTIC 6X5.8 VLCR STR LF (GAUZE/BANDAGES/DRESSINGS) ×4 IMPLANT
BNDG GAUZE ELAST 4 BULKY (GAUZE/BANDAGES/DRESSINGS) ×4 IMPLANT
CABLE SURGICAL S-101-97-12 (CABLE) ×4 IMPLANT
CANISTER SUCT 3000ML PPV (MISCELLANEOUS) ×4 IMPLANT
CANNULA AORTIC ROOT 9FR (CANNULA) ×4 IMPLANT
CANNULA EZ GLIDE 8.0 24FR (CANNULA) ×4 IMPLANT
CANNULA MC2 2 STG 29/37 NON-V (CANNULA) ×2 IMPLANT
CANNULA MC2 TWO STAGE (CANNULA) ×4
CANNULA SUMP PERICARDIAL (CANNULA) ×2 IMPLANT
CANNULA VESSEL 3MM BLUNT TIP (CANNULA) ×4 IMPLANT
CATH ROBINSON RED A/P 18FR (CATHETERS) ×8 IMPLANT
CLIP RETRACTION 3.0MM CORONARY (MISCELLANEOUS) ×2 IMPLANT
CLIP VESOCCLUDE MED 24/CT (CLIP) IMPLANT
CLIP VESOCCLUDE SM WIDE 24/CT (CLIP) IMPLANT
CONN ST 1/2X1/2  BEN (MISCELLANEOUS)
CONN ST 1/2X1/2 BEN (MISCELLANEOUS) ×2 IMPLANT
CONNECTOR BLAKE 2:1 CARIO BLK (MISCELLANEOUS) ×4 IMPLANT
DEFOGGER ANTIFOG KIT (MISCELLANEOUS) ×2 IMPLANT
DRAIN CHANNEL 19F RND (DRAIN) ×10 IMPLANT
DRAIN CONNECTOR BLAKE 1:1 (MISCELLANEOUS) ×2 IMPLANT
DRAPE CARDIOVASCULAR INCISE (DRAPES) ×4
DRAPE INCISE IOBAN 66X45 STRL (DRAPES) ×2 IMPLANT
DRAPE SLUSH/WARMER DISC (DRAPES) ×4 IMPLANT
DRAPE SRG 135X102X78XABS (DRAPES) ×2 IMPLANT
DRSG COVADERM 4X14 (GAUZE/BANDAGES/DRESSINGS) ×4 IMPLANT
ELECT BLADE 4.0 EZ CLEAN MEGAD (MISCELLANEOUS) ×4
ELECT REM PT RETURN 9FT ADLT (ELECTROSURGICAL) ×8
ELECTRODE BLDE 4.0 EZ CLN MEGD (MISCELLANEOUS) IMPLANT
ELECTRODE REM PT RTRN 9FT ADLT (ELECTROSURGICAL) ×4 IMPLANT
EVACUATOR SILICONE 100CC (DRAIN) ×4 IMPLANT
FELT TEFLON 1X6 (MISCELLANEOUS) ×6 IMPLANT
GAUZE SPONGE 4X4 12PLY STRL (GAUZE/BANDAGES/DRESSINGS) ×8 IMPLANT
GAUZE SPONGE 4X4 12PLY STRL LF (GAUZE/BANDAGES/DRESSINGS) ×4 IMPLANT
GLOVE BIO SURGEON STRL SZ 6.5 (GLOVE) ×7 IMPLANT
GLOVE BIO SURGEON STRL SZ7 (GLOVE) ×6 IMPLANT
GLOVE BIO SURGEONS STRL SZ 6.5 (GLOVE) ×7
GLOVE BIOGEL M STRL SZ7.5 (GLOVE) ×8 IMPLANT
GLOVE BIOGEL PI IND STRL 6 (GLOVE) IMPLANT
GLOVE BIOGEL PI IND STRL 6.5 (GLOVE) IMPLANT
GLOVE BIOGEL PI INDICATOR 6 (GLOVE) ×6
GLOVE BIOGEL PI INDICATOR 6.5 (GLOVE) ×6
GOWN STRL REUS W/ TWL LRG LVL3 (GOWN DISPOSABLE) ×8 IMPLANT
GOWN STRL REUS W/ TWL XL LVL3 (GOWN DISPOSABLE) ×4 IMPLANT
GOWN STRL REUS W/TWL LRG LVL3 (GOWN DISPOSABLE) ×24
GOWN STRL REUS W/TWL XL LVL3 (GOWN DISPOSABLE) ×8
HEMOSTAT POWDER SURGIFOAM 1G (HEMOSTASIS) ×12 IMPLANT
KIT BASIN OR (CUSTOM PROCEDURE TRAY) ×4 IMPLANT
KIT SUCTION CATH 14FR (SUCTIONS) ×4 IMPLANT
KIT TURNOVER KIT B (KITS) ×4 IMPLANT
KIT VASOVIEW HEMOPRO 2 VH 4000 (KITS) ×4 IMPLANT
LEAD PACING MYOCARDI (MISCELLANEOUS) ×2 IMPLANT
MARKER GRAFT CORONARY BYPASS (MISCELLANEOUS) ×12 IMPLANT
NS IRRIG 1000ML POUR BTL (IV SOLUTION) ×20 IMPLANT
PACK E OPEN HEART (SUTURE) ×4 IMPLANT
PACK OPEN HEART (CUSTOM PROCEDURE TRAY) ×4 IMPLANT
PAD ARMBOARD 7.5X6 YLW CONV (MISCELLANEOUS) ×8 IMPLANT
PAD ELECT DEFIB RADIOL ZOLL (MISCELLANEOUS) ×4 IMPLANT
PENCIL BUTTON HOLSTER BLD 10FT (ELECTRODE) ×4 IMPLANT
POSITIONER HEAD DONUT 9IN (MISCELLANEOUS) ×4 IMPLANT
PUNCH AORTIC ROTATE 4.0MM (MISCELLANEOUS) ×4 IMPLANT
SET CARDIOPLEGIA MPS 5001102 (MISCELLANEOUS) ×2 IMPLANT
SPONGE LAP 18X18 RF (DISPOSABLE) ×2 IMPLANT
SUT BONE WAX W31G (SUTURE) ×4 IMPLANT
SUT ETHIBOND X763 2 0 SH 1 (SUTURE) ×8 IMPLANT
SUT MNCRL AB 3-0 PS2 18 (SUTURE) ×8 IMPLANT
SUT PDS AB 1 CTX 36 (SUTURE) ×8 IMPLANT
SUT PROLENE 4 0 SH DA (SUTURE) ×4 IMPLANT
SUT PROLENE 5 0 C 1 36 (SUTURE) ×10 IMPLANT
SUT PROLENE 7 0 BV 1 (SUTURE) ×10 IMPLANT
SUT PROLENE 7 0 BV1 MDA (SUTURE) ×4 IMPLANT
SUT STEEL 6MS V (SUTURE) ×4 IMPLANT
SYSTEM SAHARA CHEST DRAIN ATS (WOUND CARE) ×4 IMPLANT
TAPE CLOTH SURG 4X10 WHT LF (GAUZE/BANDAGES/DRESSINGS) ×2 IMPLANT
TAPE PAPER 2X10 WHT MICROPORE (GAUZE/BANDAGES/DRESSINGS) ×2 IMPLANT
TOWEL GREEN STERILE (TOWEL DISPOSABLE) ×4 IMPLANT
TOWEL GREEN STERILE FF (TOWEL DISPOSABLE) ×4 IMPLANT
TRAY FOLEY SLVR 16FR TEMP STAT (SET/KITS/TRAYS/PACK) ×4 IMPLANT
TUBE SUCT INTRACARD DLP 20F (MISCELLANEOUS) ×4 IMPLANT
TUBE SUCTION CARDIAC 10FR (CANNULA) ×4 IMPLANT
TUBING LAP HI FLOW INSUFFLATIO (TUBING) ×4 IMPLANT
UNDERPAD 30X30 (UNDERPADS AND DIAPERS) ×4 IMPLANT
WATER STERILE IRR 1000ML POUR (IV SOLUTION) ×8 IMPLANT

## 2020-02-08 NOTE — Progress Notes (Signed)
Patient taken to the OR regularly this morning.  Postoperatively will be under CT surgery care.  TR[facility] will be available if necessary.  Stephania Fragmin MD [facility]

## 2020-02-08 NOTE — Anesthesia Procedure Notes (Addendum)
Central Venous Catheter Insertion Performed by: Marcene Duos, MD, anesthesiologist Start/End4/30/2021 6:50 AM, 02/08/2020 7:02 AM Patient location: Pre-op. Preanesthetic checklist: patient identified, IV checked, site marked, risks and benefits discussed, surgical consent, monitors and equipment checked, pre-op evaluation, timeout performed and anesthesia consent Position: Trendelenburg Lidocaine 1% used for infiltration and patient sedated Hand hygiene performed , maximum sterile barriers used  and Seldinger technique used Catheter size: 8.5 Fr Total catheter length 10. Central line was placed.Sheath introducer Swan type:thermodilution Procedure performed using ultrasound guided technique. Ultrasound Notes:anatomy identified, needle tip was noted to be adjacent to the nerve/plexus identified, no ultrasound evidence of intravascular and/or intraneural injection and image(s) printed for medical record Attempts: 1 Following insertion, line sutured, dressing applied and Biopatch. Post procedure assessment: blood return through all ports, free fluid flow and no air  Patient tolerated the procedure well with no immediate complications. Additional procedure comments: Triple lumen slick catheter inserted through swan port under sterile conditions.Marland Kitchen

## 2020-02-08 NOTE — OR Nursing (Signed)
2202 Notified patient's daughter that her mother's procedure had begun and things were going fine and that she would be notified again closer to when the procedure is finished

## 2020-02-08 NOTE — Op Note (Signed)
[address]       ,[place] [address]             [phone]                                          02/08/2020 Patient:  Bianca [name] Pre-Op Dx:  Unstable angina   3V CAD   DM   Post-op Dx:  same Procedure: CABG X 4.  LIMA LAD, RSVG, D1, OM2, RI Coronary endarterectomy of D1   Endoscopic greater saphenous vein harvest on the right Intra-operative Transesophageal Echocardiogram  Surgeon and Role:      * Tyrome Donatelli, Lucile Crater, MD - Primary    * T. Harriet Pho, [facility] - assisting   Anesthesia  general EBL:  500 ml Blood Administration: 1unit of pRBCs Xclamp Time:  87 min Pump Time:  130min  Drains: 16 F blake drain: L, mediastinal  Wires: none Counts: correct   Indications: 74 year old female with unstable angina severe three-vessel pulmonary disease.  She has a history of diabetes with a hemoglobin A1c of 8.1.  Surgical revascularization was recommended.  Findings: Small LIMA.  Good vein conduit.  Heavily calcified targets.  Endarterectomy was performed on the first diagonal vessel but after checking flows on the anastomosis there was poor flow.  The vein was divided and we found a better target more distal and created a new anastomosis.  The flows are much improved.  Upon removal of the aortic cannula we had poor control of the site thus pledgeted mattress sutures were placed around the cannulation site.  Good post cardiopulmonary bypass function on TEE, with good wall motion of the LV.  Operative Technique: All invasive lines were placed in pre-op holding.  After the risks, benefits and alternatives were thoroughly discussed, the patient was brought to the operative theatre.  Anesthesia was induced, and the patient was prepped and draped in normal sterile fashion.  An appropriate surgical pause was performed, and pre-operative antibiotics were dosed accordingly.  We began with simultaneous incisions were made along the right leg for harvesting of the  greater saphenous vein and the chest for the sternotomy.  In regards to the sternotomy, this was carried down with bovie cautery, and the sternum was divided with a reciprocating saw.  Meticulous hemostasis was obtained.  The left internal thoracic artery was exposed and harvested in in pedicled fashion.  The patient was systemically heparinized, and the artery was divided distally, and placed in a papaverine sponge.    The sternal elevator was removed, and a retractor was placed.  The pericardium was divided in the midline and fashioned into a cradle with pericardial stitches.   After we confirmed an appropriate ACT, the ascending aorta was cannulated in standard fashion.  The right atrial appendage was used for venous cannulation site.  Cardiopulmonary bypass was initiated, and the heart retractor was placed. The cross clamp was applied, and a dose of anterograde cardioplegia was given with good arrest of the heart.  We moved to the lateral wall of the heart, and found a good target on OM2.  An arteriotomy was made, and the vein graft was anastomosed to it in an end to side fashion.  Next we exposed the anterolateral wall, and found a good target on the ramus branch.  An end to side anastomosis with the vein graft was  then created.  Next, we exposed the anterior wall of the heart and identified a calcified target on D1.   An arteriotomy was created but an endarterectomy was required.  The vein was anastomosed in an end to side fashion.  The report flows on this anastomosis thus it was ligated.  Another arteriotomy was created distal to this, and an anastomosis was created.  Flows are much improved.  Finally, we exposed a good target on the LAD, and fashioned an end to side anastomosis between it and the LITA.  We began to re-warm, and a re-animation dose of cardioplegia was given.  The heart was de-aired, and the cross clamp was removed.  Meticulous hemostasis was obtained.    A partial occludding clamp was  then placed on the ascending aorta, and we created an end to side anastomosis between it and the proximal vein grafts.  The proximal sites marked with rings.  Hemostasis was obtained, and we separated from cardiopulmonary bypass without event.the heparin was reversed with protamine.  Chest tubes and wires were placed, and the sternum was re-approximated with with sternal wires.  The soft tissue and skin were re-approximated wth absorbable suture.    The patient tolerated the procedure without any immediate complications, and was transferred to the ICU in guarded condition.  Kavaughn Faucett Keane Scrape

## 2020-02-08 NOTE — OR Nursing (Signed)
1247 patients daughter notified that procedure almost finished and things went well and that Dr. Cliffton Asters would speak with them when procedure was complete.

## 2020-02-08 NOTE — Brief Op Note (Signed)
02/02/2020 - 02/08/2020  11:48 AM  PATIENT:  Bianca [name]  74 y.o. female  PRE-OPERATIVE DIAGNOSIS:  Coronary Artery Disease  POST-OPERATIVE DIAGNOSIS:  Coronary Artery Disease  PROCEDURE:  TRANSESOPHAGEAL ECHOCARDIOGRAM (TEE), CORONARY ARTERY BYPASS GRAFTING (CABG) TIMES FOUR (LIMA to LAD, SVG to DIAGONAL 1, SV to OM2, SVG to RAMUS INTERMEDIATE) USING LEFT INTERNAL MAMMARY ARTERY AND RIGHT SAPHENOUS LEG VEIN HARVESTED ENDOSCOPICALLY   SURGEON:  Surgeon(s) and Role:    Lightfoot, Eliezer Lofts, MD - Primary  PHYSICIAN ASSISTANTS: 1. Jari Favre PA-C 2. Doree Fudge PA-C  ASSISTANTS: Benay Spice RNFA  ANESTHESIA:   regional  EBL: Per anesthesia record  DRAINS: Chest tubes placed in the mediastinal and pleural spaces   COUNTS CORRECT:  YES  DICTATION: .Dragon Dictation  PLAN OF CARE: Admit to inpatient   PATIENT DISPOSITION:  ICU - intubated and hemodynamically stable.   Delay start of Pharmacological VTE agent (>24hrs) due to surgical blood loss or risk of bleeding: yes  BASELINE WEIGHT: 71.6 kg

## 2020-02-08 NOTE — Transfer of Care (Signed)
Immediate Anesthesia Transfer of Care Note  Patient: ARIANNI GALLEGO  Procedure(s) Performed: CORONARY ARTERY BYPASS GRAFTING (CABG) TIMES FOUR USING LEFT INTERNAL MAMMARY ARTERY AND RIGHT SAPHENOUS LEG VEIN HARVESTED ENDOSCOPICALLY (N/A Chest) TRANSESOPHAGEAL ECHOCARDIOGRAM (TEE) (N/A Esophagus)  Patient Location: ICU  Anesthesia Type:General  Level of Consciousness: sedated  Airway & Oxygen Therapy: Patient remains intubated per anesthesia plan and Patient placed on Ventilator (see vital sign flow sheet for setting)  Post-op Assessment: Report given to RN and Post -op Vital signs reviewed and stable  Post vital signs: Reviewed and stable  Last Vitals:  Vitals Value Taken Time  BP 102/68 02/08/20 1341  Temp    Pulse 79 02/08/20 1344  Resp 19 02/08/20 1344  SpO2 100 % 02/08/20 1344  Vitals shown include unvalidated device data.  Last Pain:  Vitals:   02/08/20 0544  TempSrc: Oral  PainSc:       Patients Stated Pain Goal: 0 (02/06/20 2322)  Complications: No apparent anesthesia complications

## 2020-02-08 NOTE — Anesthesia Procedure Notes (Signed)
Procedure Name: Intubation Performed by: Ezekiel Ina, CRNA Pre-anesthesia Checklist: Patient identified, Emergency Drugs available, Suction available and Patient being monitored Patient Re-evaluated:Patient Re-evaluated prior to induction Oxygen Delivery Method: Circle System Utilized Preoxygenation: Pre-oxygenation with 100% oxygen Induction Type: IV induction Ventilation: Mask ventilation without difficulty Laryngoscope Size: Miller and 2 Grade View: Grade II Tube type: Oral Tube size: 7.5 mm Number of attempts: 2 Airway Equipment and Method: Stylet and Oral airway Placement Confirmation: ETT inserted through vocal cords under direct vision,  positive ETCO2 and breath sounds checked- equal and bilateral Secured at: 21 cm Tube secured with: Tape Dental Injury: Teeth and Oropharynx as per pre-operative assessment  Comments: G3 view with MAC3. G2 with Mil 2

## 2020-02-08 NOTE — Progress Notes (Signed)
     [address]       [place] [address]             [phone]       No events OR today for CABG 4.  Bianca [name] Scrape

## 2020-02-08 NOTE — Progress Notes (Signed)
  Echocardiogram Echocardiogram Transesophageal has been performed.  Janalyn Harder 02/08/2020, 8:56 AM

## 2020-02-08 NOTE — Progress Notes (Signed)
NIF -25, VC .9 Good cuff leak. Pt extubated to 4LT [place]. SATS 99%. IS 300 with fair effort.

## 2020-02-08 NOTE — Anesthesia Procedure Notes (Addendum)
Arterial Line Insertion Start/End4/30/2021 6:45 AM, 02/08/2020 6:55 AM Performed by: Ezekiel Ina, CRNA, CRNA  Patient location: Pre-op. Preanesthetic checklist: patient identified, IV checked, site marked, risks and benefits discussed, surgical consent, monitors and equipment checked, pre-op evaluation, timeout performed and anesthesia consent Lidocaine 1% used for infiltration Left, radial was placed Catheter size: 20 G Hand hygiene performed , maximum sterile barriers used  and Seldinger technique used  Attempts: 1 Procedure performed without using ultrasound guided technique. Following insertion, dressing applied and Biopatch. Post procedure assessment: normal and unchanged  Patient tolerated the procedure well with no immediate complications.

## 2020-02-08 NOTE — Procedures (Signed)
Extubation Procedure Note  Patient Details:   Name: Bianca [name] DOB: 1946-05-31 MRN: [id number]   Airway Documentation:  Airway 7.5 mm (Active)  Secured at (cm) 21 cm 02/08/20 1600  Measured From Lips 02/08/20 1600  Secured Location Right 02/08/20 1600  Secured By Pink Tape 02/08/20 1600  Site Condition Dry 02/08/20 1600    Vent end time: (not recorded)   Evaluation  O2 sats: stable throughout Complications: No apparent complications Patient did tolerate procedure well. Bilateral Breath Sounds: Clear   Yes  Rushie Chestnut [facility] 02/08/2020, 8:48 PM

## 2020-02-08 NOTE — Anesthesia Postprocedure Evaluation (Signed)
Anesthesia Post Note  Patient: Bianca [name]  Procedure(s) Performed: CORONARY ARTERY BYPASS GRAFTING (CABG) TIMES FOUR USING LEFT INTERNAL MAMMARY ARTERY AND RIGHT SAPHENOUS LEG VEIN HARVESTED ENDOSCOPICALLY (N/A Chest) TRANSESOPHAGEAL ECHOCARDIOGRAM (TEE) (N/A Esophagus)     Patient location during evaluation: ICU Anesthesia Type: General Level of consciousness: sedated and patient remains intubated per anesthesia plan Pain management: pain level controlled Vital Signs Assessment: post-procedure vital signs reviewed and stable Respiratory status: patient remains intubated per anesthesia plan Cardiovascular status: stable Postop Assessment: no apparent nausea or vomiting Anesthetic complications: no    Last Vitals:  Vitals:   02/08/20 0544 02/08/20 1341  BP: (!) 158/78 102/68  Pulse: 74 79  Resp:  (!) 24  Temp: 36.8 C   SpO2: 96%     Last Pain:  Vitals:   02/08/20 0544  TempSrc: Oral  PainSc:                  Beryle Lathe

## 2020-02-09 ENCOUNTER — Inpatient Hospital Stay (HOSPITAL_COMMUNITY): Payer: Medicare Other

## 2020-02-09 LAB — CBC
HCT: 26.8 % — ABNORMAL LOW (ref 36.0–46.0)
Hemoglobin: 8.3 g/dL — ABNORMAL LOW (ref 12.0–15.0)
MCH: 27.5 pg (ref 26.0–34.0)
MCHC: 31 g/dL (ref 30.0–36.0)
MCV: 88.7 fL (ref 80.0–100.0)
Platelets: 61 10*3/uL — ABNORMAL LOW (ref 150–400)
RBC: 3.02 MIL/uL — ABNORMAL LOW (ref 3.87–5.11)
RDW: 14.2 % (ref 11.5–15.5)
WBC: 12.2 10*3/uL — ABNORMAL HIGH (ref 4.0–10.5)
nRBC: 0 % (ref 0.0–0.2)

## 2020-02-09 LAB — GLUCOSE, CAPILLARY
Glucose-Capillary: 123 mg/dL — ABNORMAL HIGH (ref 70–99)
Glucose-Capillary: 100 mg/dL — ABNORMAL HIGH (ref 70–99)
Glucose-Capillary: 120 mg/dL — ABNORMAL HIGH (ref 70–99)
Glucose-Capillary: 100 mg/dL — ABNORMAL HIGH (ref 70–99)
Glucose-Capillary: 123 mg/dL — ABNORMAL HIGH (ref 70–99)
Glucose-Capillary: 121 mg/dL — ABNORMAL HIGH (ref 70–99)
Glucose-Capillary: 125 mg/dL — ABNORMAL HIGH (ref 70–99)
Glucose-Capillary: 133 mg/dL — ABNORMAL HIGH (ref 70–99)
Glucose-Capillary: 158 mg/dL — ABNORMAL HIGH (ref 70–99)

## 2020-02-09 LAB — BASIC METABOLIC PANEL
Anion gap: 7 (ref 5–15)
BUN: 21 mg/dL (ref 8–23)
CO2: 21 mmol/L — ABNORMAL LOW (ref 22–32)
Calcium: 8 mg/dL — ABNORMAL LOW (ref 8.9–10.3)
Chloride: 112 mmol/L — ABNORMAL HIGH (ref 98–111)
Creatinine, Ser: 1.02 mg/dL — ABNORMAL HIGH (ref 0.44–1.00)
GFR calc Af Amer: >60 mL/min (ref >60)
GFR calc non Af Amer: 54 mL/min — ABNORMAL LOW (ref >60)
Glucose, Bld: 106 mg/dL — ABNORMAL HIGH (ref 70–99)
Potassium: 4.5 mmol/L (ref 3.5–5.1)
Sodium: 140 mmol/L (ref 135–145)

## 2020-02-09 LAB — MAGNESIUM: Magnesium: 3.2 mg/dL — ABNORMAL HIGH (ref 1.7–2.4)

## 2020-02-09 MED ORDER — ENOXAPARIN SODIUM 40 MG/0.4ML ~~LOC~~ SOLN: 40.0000 mg | Freq: Every day | SUBCUTANEOUS | Status: DC

## 2020-02-09 MED ORDER — ORAL CARE MOUTH RINSE
15.0000 mL | Freq: Two times a day (BID) | OROMUCOSAL | Status: DC
  Administered 2020-02-09 – 2020-02-15 (×9): 15 mL via OROMUCOSAL

## 2020-02-09 MED ORDER — INSULIN ASPART 100 UNIT/ML ~~LOC~~ SOLN
0.0000 [IU] | SUBCUTANEOUS | Status: DC
  Administered 2020-02-09 (×2): 2 [IU] via SUBCUTANEOUS
  Administered 2020-02-10: 8 [IU] via SUBCUTANEOUS
  Administered 2020-02-10: 12 [IU] via SUBCUTANEOUS
  Administered 2020-02-10: 4 [IU] via SUBCUTANEOUS
  Administered 2020-02-11 – 2020-02-12 (×7): 2 [IU] via SUBCUTANEOUS

## 2020-02-09 NOTE — Progress Notes (Signed)
      [address]       [place] [address]             [phone]                 1 Day Post-Op Procedure(s) (LRB): CORONARY ARTERY BYPASS GRAFTING (CABG) TIMES FOUR USING LEFT INTERNAL MAMMARY ARTERY AND RIGHT SAPHENOUS LEG VEIN HARVESTED ENDOSCOPICALLY (N/A) TRANSESOPHAGEAL ECHOCARDIOGRAM (TEE) (N/A)   Events: No events _______________________________________________________________ Vitals: BP (!) 101/57   Pulse 71   Temp 97.8 F (36.6 C) (Oral)   Resp 17   Ht 5\' 6"  (1.676 m)   Wt 75.8 kg   SpO2 100%   BMI 26.97 kg/m   - Neuro: alert NAD  - Cardiovascular: sinus  Drips: neo 50.   CVP:  [5 mmHg-19 mmHg] 11 mmHg  - Pulm: EWOB  ABG    Component Value Date/Time   PHART 7.342 (L) 02/08/2020 2029   PCO2ART 39.7 02/08/2020 2029   PO2ART 146 (H) 02/08/2020 2029   HCO3 21.7 02/08/2020 2029   TCO2 23 02/08/2020 2029   ACIDBASEDEF 4.0 (H) 02/08/2020 2029   O2SAT 99.0 02/08/2020 2029    - Abd: soft - Extremity: trace edema  .Intake/Output      04/30 0701 - 05/01 0700 05/01 0701 - 05/02 0700   P.O.     I.V. (mL/kg) 2484.3 (32.8)    Blood 650    IV Piggyback 2251.4    Total Intake(mL/kg) 5385.7 (71.1)    Urine (mL/kg/hr) 1865 (1)    Chest Tube 475    Total Output 2340    Net +3045.7            _______________________________________________________________ Labs: CBC Latest Ref Rng & Units 02/09/2020 02/08/2020 02/08/2020  WBC 4.0 - 10.5 K/uL 12.2(H) - 14.3(H)  Hemoglobin 12.0 - 15.0 g/dL 8.3(L) 8.5(L) 8.8(L)  Hematocrit 36.0 - 46.0 % 26.8(L) 25.0(L) 27.8(L)  Platelets 150 - 400 K/uL 61(L) - 81(L)   CMP Latest Ref Rng & Units 02/09/2020 02/08/2020 02/08/2020  Glucose 70 - 99 mg/dL 02/10/2020) - 829(F)  BUN 8 - 23 mg/dL 21 - 19  Creatinine 621(H - 1.00 mg/dL 0.86) - 5.78(I  Sodium 135 - 145 mmol/L 140 140 139  Potassium 3.5 - 5.1 mmol/L 4.5 4.3 4.6  Chloride 98 - 111 mmol/L 112(H) - 111  CO2 22 - 32 mmol/L 21(L) - 21(L)  Calcium 8.9 - 10.3  mg/dL 8.0(L) - 7.8(L)  Total Protein 6.0 - 8.5 g/dL - - -  Total Bilirubin 0.0 - 1.2 mg/dL - - -  Alkaline Phos 39 - 117 IU/L - - -  AST 0 - 40 IU/L - - -  ALT 0 - 32 IU/L - - -    CXR: PV congestion  _______________________________________________________________  Assessment and Plan: POD 1 s/p CABG  Neuro: pain controlled CV: on neo for BP.  Will wean as tolerated.  Will keep aline till off neo. Pulm: continue pulm toile Renal: good uop, remains positive.  Will hold on diuresis given low BP GI: advancing diet.  Some nausea Heme: stable.  Low plts, will follow ID: afebrile Endo: SSI  Dispo: continue ICU care  6.96, MD 02/09/2020 11:06 AM

## 2020-02-09 NOTE — Progress Notes (Signed)
Pt has very poor appetite, has only taken in few sips of soup. She has been able to drink 240 of water. Pt is lethargic but arousable. Pt sat in chair today for 4 hours. Pt is a stand pivot transfer. I did not ambulate patient due to her need for neo and her lethargy. Melodye Ped

## 2020-02-10 ENCOUNTER — Inpatient Hospital Stay (HOSPITAL_COMMUNITY): Payer: Medicare Other

## 2020-02-10 LAB — BASIC METABOLIC PANEL
Anion gap: 6 (ref 5–15)
BUN: 36 mg/dL — ABNORMAL HIGH (ref 8–23)
CO2: 21 mmol/L — ABNORMAL LOW (ref 22–32)
Calcium: 7.8 mg/dL — ABNORMAL LOW (ref 8.9–10.3)
Chloride: 112 mmol/L — ABNORMAL HIGH (ref 98–111)
Creatinine, Ser: 1.45 mg/dL — ABNORMAL HIGH (ref 0.44–1.00)
GFR calc Af Amer: 41 mL/min — ABNORMAL LOW (ref >60)
GFR calc non Af Amer: 35 mL/min — ABNORMAL LOW (ref >60)
Glucose, Bld: 149 mg/dL — ABNORMAL HIGH (ref 70–99)
Potassium: 4.4 mmol/L (ref 3.5–5.1)
Sodium: 139 mmol/L (ref 135–145)

## 2020-02-10 LAB — GLUCOSE, CAPILLARY
Glucose-Capillary: 101 mg/dL — ABNORMAL HIGH (ref 70–99)
Glucose-Capillary: 119 mg/dL — ABNORMAL HIGH (ref 70–99)
Glucose-Capillary: 139 mg/dL — ABNORMAL HIGH (ref 70–99)
Glucose-Capillary: 150 mg/dL — ABNORMAL HIGH (ref 70–99)
Glucose-Capillary: 165 mg/dL — ABNORMAL HIGH (ref 70–99)
Glucose-Capillary: 249 mg/dL — ABNORMAL HIGH (ref 70–99)
Glucose-Capillary: 278 mg/dL — ABNORMAL HIGH (ref 70–99)

## 2020-02-10 LAB — CBC
HCT: 25.7 % — ABNORMAL LOW (ref 36.0–46.0)
Hemoglobin: 8.1 g/dL — ABNORMAL LOW (ref 12.0–15.0)
MCH: 27.4 pg (ref 26.0–34.0)
MCHC: 31.5 g/dL (ref 30.0–36.0)
MCV: 86.8 fL (ref 80.0–100.0)
Platelets: 84 10*3/uL — ABNORMAL LOW (ref 150–400)
RBC: 2.96 MIL/uL — ABNORMAL LOW (ref 3.87–5.11)
RDW: 14.7 % (ref 11.5–15.5)
WBC: 13 10*3/uL — ABNORMAL HIGH (ref 4.0–10.5)
nRBC: 0 % (ref 0.0–0.2)

## 2020-02-10 MED ORDER — MIDODRINE HCL 5 MG PO TABS
10.0000 mg | ORAL_TABLET | Freq: Three times a day (TID) | ORAL | Status: DC
  Administered 2020-02-10 – 2020-02-12 (×8): 10 mg via ORAL
  Filled 2020-02-10 (×9): qty 2

## 2020-02-10 MED ORDER — ALBUMIN HUMAN 5 % IV SOLN
12.5000 g | Freq: Once | INTRAVENOUS | Status: AC
  Administered 2020-02-10: 12.5 g via INTRAVENOUS
  Filled 2020-02-10: qty 250

## 2020-02-10 MED ORDER — ALBUMIN HUMAN 5 % IV SOLN
25.0000 g | Freq: Once | INTRAVENOUS | Status: AC
  Administered 2020-02-10: 25 g via INTRAVENOUS
  Filled 2020-02-10: qty 500

## 2020-02-10 MED ORDER — PHENYLEPHRINE CONCENTRATED 100MG/250ML (0.4 MG/ML) INFUSION SIMPLE
0.0000 ug/min | INTRAVENOUS | Status: DC
  Administered 2020-02-10: 63 ug/min via INTRAVENOUS
  Filled 2020-02-10: qty 250

## 2020-02-10 NOTE — Progress Notes (Signed)
Pt is becoming more confused and states water tastes like metal. Pt still has not voided. Bladder scan unchanged from prior. Have paged Dr. Cliffton Asters to inform.Melodye Ped

## 2020-02-10 NOTE — Progress Notes (Signed)
Per night shift report, pt was assisted to bsc aroung 530am and heart rate dropped to 40, became very lethargic. Assisted back to bed bp was low and neo had to be increased to 70, as well as patient received a 500cc bolus. Tammy Sours.RN

## 2020-02-10 NOTE — Progress Notes (Signed)
      [address]       [place] [address]             [phone]                 2 Days Post-Op Procedure(s) (LRB): CORONARY ARTERY BYPASS GRAFTING (CABG) TIMES FOUR USING LEFT INTERNAL MAMMARY ARTERY AND RIGHT SAPHENOUS LEG VEIN HARVESTED ENDOSCOPICALLY (N/A) TRANSESOPHAGEAL ECHOCARDIOGRAM (TEE) (N/A)   Events: No events _______________________________________________________________ Vitals: BP (!) 105/59   Pulse 71   Temp 98.1 F (36.7 C) (Oral)   Resp 15   Ht 5\' 6"  (1.676 m)   Wt 80.6 kg   SpO2 96%   BMI 28.68 kg/m   - Neuro: alert NAD  - Cardiovascular: sinus  Drips: neo 63   CVP:  [8 mmHg-22 mmHg] 13 mmHg  - Pulm: EWOB  ABG    Component Value Date/Time   PHART 7.342 (L) 02/08/2020 2029   PCO2ART 39.7 02/08/2020 2029   PO2ART 146 (H) 02/08/2020 2029   HCO3 21.7 02/08/2020 2029   TCO2 23 02/08/2020 2029   ACIDBASEDEF 4.0 (H) 02/08/2020 2029   O2SAT 99.0 02/08/2020 2029    - Abd: soft - Extremity: trace edema  .Intake/Output      05/01 0701 - 05/02 0700 05/02 0701 - 05/03 0700   P.O. 720    I.V. (mL/kg) 1298.1 (16.1) 307.9 (3.8)   Blood     IV Piggyback 200    Total Intake(mL/kg) 2218.1 (27.5) 307.9 (3.8)   Urine (mL/kg/hr) 575 (0.3)    Chest Tube 190 0   Total Output 765 0   Net +1453.1 +307.9           _______________________________________________________________ Labs: CBC Latest Ref Rng & Units 02/10/2020 02/09/2020 02/08/2020  WBC 4.0 - 10.5 K/uL 13.0(H) 12.2(H) -  Hemoglobin 12.0 - 15.0 g/dL 8.1(L) 8.3(L) 8.5(L)  Hematocrit 36.0 - 46.0 % 25.7(L) 26.8(L) 25.0(L)  Platelets 150 - 400 K/uL 84(L) 61(L) -   CMP Latest Ref Rng & Units 02/10/2020 02/09/2020 02/08/2020  Glucose 70 - 99 mg/dL 02/10/2020) 716(R) -  BUN 8 - 23 mg/dL 678(L) 21 -  Creatinine 0.44 - 1.00 mg/dL 38(B) 0.17(P) -  Sodium 135 - 145 mmol/L 139 140 140  Potassium 3.5 - 5.1 mmol/L 4.4 4.5 4.3  Chloride 98 - 111 mmol/L 112(H) 112(H) -  CO2 22 - 32 mmol/L 21(L)  21(L) -  Calcium 8.9 - 10.3 mg/dL 7.8(L) 8.0(L) -  Total Protein 6.0 - 8.5 g/dL - - -  Total Bilirubin 0.0 - 1.2 mg/dL - - -  Alkaline Phos 39 - 117 IU/L - - -  AST 0 - 40 IU/L - - -  ALT 0 - 32 IU/L - - -    CXR: PV congestion  _______________________________________________________________  Assessment and Plan: POD 2 s/p CABG  Neuro: pain controlled CV: on neo for BP.  Will wean as tolerated.  Will keep aline till off neo.  Will start midodrine, and give 1.02(H of albumin Pulm: continue pulm toile Renal: decreased uop, creat up slightly.  Will give fluid bolus, and encourage PO intake GI: advancing diet.  Some nausea Heme: stable.  Low plts, will follow ID: afebrile Endo: SSI  Dispo: continue ICU care  , MD 02/10/2020 10:30 AM

## 2020-02-10 NOTE — Progress Notes (Signed)
Pt has not voided since 0630 am. I have assisted pt to [facility] per my request and has not voided, feels no urge. At 1700 bladder scanned for 239 cc ,continue to monitor. Pt doesn't have an appetite (although did finally eat 30 percent of lunch) and does not want to drink.With family's assistance ,pt has drank 4 cranberry juices and 120 cc of bottled water.  Pt has become increasingly restless and has been pulling at her lines. Aline was malpositioned due to this and was DC'd. Pt remains confused at times.

## 2020-02-10 NOTE — Plan of Care (Signed)

## 2020-02-11 LAB — BPAM RBC
Blood Product Expiration Date: 202105272359
Blood Product Expiration Date: 202105272359
Blood Product Expiration Date: 202105292359
Blood Product Expiration Date: 202105292359
ISSUE DATE / TIME: 202104290932
ISSUE DATE / TIME: 202104301048
ISSUE DATE / TIME: 202104301048
Unit Type and Rh: 5100
Unit Type and Rh: 5100
Unit Type and Rh: 5100
Unit Type and Rh: 5100

## 2020-02-11 LAB — TYPE AND SCREEN
ABO/RH(D): O POS
Antibody Screen: NEGATIVE
Unit division: 0
Unit division: 0
Unit division: 0
Unit division: 0

## 2020-02-11 LAB — BASIC METABOLIC PANEL
Anion gap: 6 (ref 5–15)
BUN: 50 mg/dL — ABNORMAL HIGH (ref 8–23)
CO2: 23 mmol/L (ref 22–32)
Calcium: 7.8 mg/dL — ABNORMAL LOW (ref 8.9–10.3)
Chloride: 111 mmol/L (ref 98–111)
Creatinine, Ser: 1.8 mg/dL — ABNORMAL HIGH (ref 0.44–1.00)
GFR calc Af Amer: 32 mL/min — ABNORMAL LOW (ref >60)
GFR calc non Af Amer: 27 mL/min — ABNORMAL LOW (ref >60)
Glucose, Bld: 126 mg/dL — ABNORMAL HIGH (ref 70–99)
Potassium: 4.3 mmol/L (ref 3.5–5.1)
Sodium: 140 mmol/L (ref 135–145)

## 2020-02-11 LAB — CBC
HCT: 23.9 % — ABNORMAL LOW (ref 36.0–46.0)
Hemoglobin: 7.2 g/dL — ABNORMAL LOW (ref 12.0–15.0)
MCH: 27 pg (ref 26.0–34.0)
MCHC: 30.1 g/dL (ref 30.0–36.0)
MCV: 89.5 fL (ref 80.0–100.0)
Platelets: 80 10*3/uL — ABNORMAL LOW (ref 150–400)
RBC: 2.67 MIL/uL — ABNORMAL LOW (ref 3.87–5.11)
RDW: 15.2 % (ref 11.5–15.5)
WBC: 10.1 10*3/uL (ref 4.0–10.5)
nRBC: 0.3 % — ABNORMAL HIGH (ref 0.0–0.2)

## 2020-02-11 LAB — GLUCOSE, CAPILLARY
Glucose-Capillary: 115 mg/dL — ABNORMAL HIGH (ref 70–99)
Glucose-Capillary: 120 mg/dL — ABNORMAL HIGH (ref 70–99)
Glucose-Capillary: 123 mg/dL — ABNORMAL HIGH (ref 70–99)
Glucose-Capillary: 106 mg/dL — ABNORMAL HIGH (ref 70–99)
Glucose-Capillary: 130 mg/dL — ABNORMAL HIGH (ref 70–99)

## 2020-02-11 MED ORDER — FUROSEMIDE 10 MG/ML IJ SOLN: 40.0000 mg | Freq: Once | INTRAMUSCULAR | Status: DC

## 2020-02-11 MED ORDER — FUROSEMIDE 10 MG/ML IJ SOLN
40.0000 mg | Freq: Once | INTRAMUSCULAR | Status: AC
  Administered 2020-02-11: 40 mg via INTRAVENOUS
  Filled 2020-02-11: qty 4

## 2020-02-11 MED ORDER — FUROSEMIDE 10 MG/ML IJ SOLN
40.0000 mg | Freq: Once | INTRAMUSCULAR | Status: AC
  Administered 2020-02-11: 14:00:00 40 mg via INTRAVENOUS
  Filled 2020-02-11: qty 4

## 2020-02-11 MED FILL — Heparin Sodium (Porcine) Inj 1000 Unit/ML: INTRAMUSCULAR | Qty: 30 | Status: AC

## 2020-02-11 MED FILL — Lidocaine HCl Local Preservative Free (PF) Inj 2%: INTRAMUSCULAR | Qty: 15 | Status: AC

## 2020-02-11 MED FILL — Potassium Chloride Inj 2 mEq/ML: INTRAVENOUS | Qty: 40 | Status: AC

## 2020-02-11 NOTE — Progress Notes (Signed)
CT surgery  Blood pressure (!) 102/57, pulse 71, temperature 98.8 F (37.1 C), temperature source Oral, resp. rate 13, height 5\' 6"  (1.676 m), weight 79.6 kg, SpO2 97 %.  Stable day- up to chair nsr  Resting comfortably

## 2020-02-11 NOTE — Progress Notes (Signed)
[address]       [place] [address]             [phone]      3 Days Post-Op Procedure(s) (LRB): CORONARY ARTERY BYPASS GRAFTING (CABG) TIMES FOUR USING LEFT INTERNAL MAMMARY ARTERY AND RIGHT SAPHENOUS LEG VEIN HARVESTED ENDOSCOPICALLY (N/A) TRANSESOPHAGEAL ECHOCARDIOGRAM (TEE) (N/A) Subjective: No specific c/o , nursing reports some confusion/sundowning, appears fatigued  Objective: Vital signs in last 24 hours: Temp:  [96.5 F (35.8 C)-98.1 F (36.7 C)] 97.8 F (36.6 C) (05/03 0420) Pulse Rate:  [28-88] 73 (05/03 0730) Cardiac Rhythm: Normal sinus rhythm (05/03 0400) Resp:  [11-27] 14 (05/03 0730) BP: (82-130)/(45-83) 128/63 (05/03 0730) SpO2:  [64 %-100 %] 95 % (05/03 0730) Arterial Line BP: (87-132)/(37-68) 87/37 (05/02 1645) Weight:  [79.6 kg] 79.6 kg (05/03 0417)  Hemodynamic parameters for last 24 hours: CVP:  [9 mmHg-26 mmHg] 21 mmHg  Intake/Output from previous day: 05/02 0701 - 05/03 0700 In: 3340 [P.O.:900; I.V.:1429.1; IV Piggyback:510.9] Out: 250 [Urine:250] Intake/Output this shift: No intake/output data recorded.  General appearance: alert, cooperative, distracted, fatigued and no distress Neurologic: grossly intact , aware she is at [facility] Heart: regular rate and rhythm Lungs: dimin in lower fields Abdomen: abnormal findings:  soft, nontender, non distended Extremities: minor edema Wound: dressings intact, minor drainage  Lab Results: Recent Labs    02/10/20 0321 02/11/20 0420  WBC 13.0* 10.1  HGB 8.1* 7.2*  HCT 25.7* 23.9*  PLT 84* 80*   BMET:  Recent Labs    02/10/20 0321 02/11/20 0420  NA 139 140  K 4.4 4.3  CL 112* 111  CO2 21* 23  GLUCOSE 149* 126*  BUN 36* 50*  CREATININE 1.45* 1.80*  CALCIUM 7.8* 7.8*    PT/INR:  Recent Labs    02/08/20 1411  LABPROT 17.2*  INR 1.5*   ABG    Component Value Date/Time   PHART 7.342 (L) 02/08/2020 2029   HCO3 21.7 02/08/2020 2029   TCO2 23 02/08/2020  2029   ACIDBASEDEF 4.0 (H) 02/08/2020 2029   O2SAT 99.0 02/08/2020 2029   CBG (last 3)  Recent Labs    02/10/20 2350 02/11/20 0415 02/11/20 0734  GLUCAP 101* 115* 120*    Meds Scheduled Meds: . acetaminophen  1,000 mg Oral Q6H   Or  . acetaminophen (TYLENOL) oral liquid 160 mg/5 mL  1,000 mg Per Tube Q6H  . aspirin EC  325 mg Oral Daily   Or  . aspirin  324 mg Per Tube Daily  . atorvastatin  80 mg Oral Daily  . bisacodyl  10 mg Oral Daily   Or  . bisacodyl  10 mg Rectal Daily  . Chlorhexidine Gluconate Cloth  6 each Topical Once  . docusate sodium  200 mg Oral Daily  . insulin aspart  0-24 Units Subcutaneous Q4H  . mouth rinse  15 mL Mouth Rinse BID  . metoprolol tartrate  12.5 mg Oral BID   Or  . metoprolol tartrate  12.5 mg Per Tube BID  . midodrine  10 mg Oral TID WC  . pantoprazole  40 mg Oral Daily  . sodium chloride flush  10-40 mL Intracatheter Q12H   Continuous Infusions: . lactated ringers    . lactated ringers 10 mL/hr at 02/11/20 0600  . phenylephrine 32 mcg/min (02/11/20 0600)   PRN Meds:.dextrose, lactated ringers, metoprolol tartrate, morphine injection, ondansetron (ZOFRAN) IV, oxyCODONE, sodium chloride flush, sodium chloride flush  Xrays DG Chest Port 1 View  Result Date: 02/10/2020 CLINICAL DATA:  Pleural effusion. EXAM: PORTABLE CHEST 1 VIEW COMPARISON:  02/09/2020 FINDINGS: Sternotomy wires unchanged. Right IJ central venous catheter unchanged with tip over the SVC. Left basilar chest tube unchanged. Catheter extending from below with tip projecting over the region of the main pulmonary artery segment unchanged. Lungs are adequately inflated and demonstrate mild opacification over the left base likely small effusion with atelectasis without significant change. Possible small amount right pleural fluid. Stable cardiomegaly. Remainder the exam is unchanged. IMPRESSION: 1. Stable left base opacification likely small effusion with atelectasis. Possible  small amount right pleural fluid. Stable cardiomegaly. 2.  Tubes and lines unchanged. Electronically Signed   By: Marin Olp M.D.   On: 02/10/2020 09:05    Assessment/Plan: S/P Procedure(s) (LRB): CORONARY ARTERY BYPASS GRAFTING (CABG) TIMES FOUR USING LEFT INTERNAL MAMMARY ARTERY AND RIGHT SAPHENOUS LEG VEIN HARVESTED ENDOSCOPICALLY (N/A) TRANSESOPHAGEAL ECHOCARDIOGRAM (TEE) (N/A)  1 slow but steady progress POD#3 2 stable hemodynamics on low dose neo and midodrine, wean neo as able 3 sinus rhythm with some PVC's 4 normokalemia 5 renal insuff (AKI) , creat 1.8, BUN 50 , poor UOP, CVP is 22 this am will give a dose of IV lasix this am. Monitor closely 6 ABL anemia, may still be equalibrating, currently not in transfusion threshold. Monitor closely .  7 thrombocytopenia, fairly stable at 80 K, cont to monitor, may need HIT panel if conts to trend lower 8 small pleural effusions-  monitor /diurese 9 BS quite variable- poorly controlled at home, but improving trends, most recent Hg A1C 8.1, has been as high as 14- cont SSI till can restart glocophage 10 will need aggressive rehab- will ask PT to see   LOS: 8 days    John Giovanni PA-C Pager [phone] 02/11/2020

## 2020-02-11 NOTE — Progress Notes (Signed)
Inpatient Diabetes Program Recommendations  AACE/ADA: New Consensus Statement on Inpatient Glycemic Control (2015)  Target Ranges:  Prepandial:   less than 140 mg/dL      Peak postprandial:   less than 180 mg/dL (1-2 hours)      Critically ill patients:  140 - 180 mg/dL   Results for PARTICIA, STRAHM (MRN [id number]) as of 02/11/2020 10:19  Ref. Range 02/09/2020 23:43 02/10/2020 03:16 02/10/2020 07:37 02/10/2020 11:52 02/10/2020 15:28 02/10/2020 20:05  Glucose-Capillary Latest Ref Range: 70 - 99 mg/dL 979 (H)  2 units NOVOLOG  119 (H) 139 (H) 249 (H)  8 units NOVOLOG  278 (H)  12 units NOVOLOG  165 (H)  4 units NOVOLOG      Home DM Meds: Metformin 1000 mg BID  Current Orders: Novolog 0-24 units Q4 hours     MD- Please consider the following:  1. Change Novolog 0-24 units timing to TID AC + HS  2. Start Novolog Meal Coverage: Novolog 4 units TID with meals  (Please add the following Hold Parameters: Hold if pt eats <50% of meal, Hold if pt NPO)      --Will follow patient during hospitalization--  Ambrose Finland RN, MSN, CDE Diabetes Coordinator Inpatient Glycemic Control Team Team Pager: [phone] (8a-5p)

## 2020-02-11 NOTE — Discharge Summary (Signed)
Physician Discharge Summary  Patient ID: Bianca [name] MRN: [id number] DOB/AGE: 1946/02/14 74 y.o.  Admit date: 02/02/2020 Discharge date: 02/15/2020  Admission Diagnoses: Unstable angina  Discharge Diagnoses:  Principal Problem:   Unstable angina [facility]) Active Problems:   Type 2 diabetes mellitus ([place])   Essential hypertension   Hyperlipidemia   CAD (coronary artery disease)   Hypokalemia   S/P CABG x 4   Coronary artery disease  Patient Active Problem List   Diagnosis Date Noted  . S/P CABG x 4 02/08/2020  . Coronary artery disease 02/08/2020  . Chest pain 02/03/2020  . Unstable angina ([place]) 02/03/2020  . Hypokalemia 02/03/2020  . CAD (coronary artery disease) 01/31/2020  . Pain due to onychomycosis of toenails of both feet 01/30/2020  . Chest pain of uncertain etiology 65/53/7482  . Non-STEMI (non-ST elevated myocardial infarction) ([place]) 01/29/2020  . Essential hypertension 01/29/2020  . Hyperlipidemia 01/29/2020  . Acute diastolic CHF (congestive heart failure) ([place]) 01/06/2020  . CHF (congestive heart failure) ([place]) 01/06/2020  . Noncompliance with medication regimen 08/17/2018  . Noncompliance with diagnostic testing 08/17/2018  . Type 2 diabetes mellitus ([place]) 04/17/2008  . TOBACCO ABUSE 04/17/2008   History of Present Illness:    at time of consultation Bianca [name] is well-known to our service.  She was seen last week after undergoing an elective left heart cath and surgical revascularization was recommended.  After multiple discussion with her and her family she refused surgery and was discharged with outpatient follow-up.  While at hold she experienced some dyspnea and chest pain and changed her mind about undergoing surgery.  She currently is not experiencing any chest pain.   Discharged Condition: good  Hospital Course: The patient was stabilized medically on the medicine and cardiology service and scheduled for surgery.  On 02/08/2020 she was taken to the  operating room at which time she underwent the below described procedure.  She tolerated it well and was taken to the surgical intensive care unit in stable condition  Post operative hospital course:  Patient has overall progressed well.  She was weaned from the ventilator using standard protocols without difficulty.  She did require some blood pressure support initially with neo-.  This was able to be weaned off over time but was placed on a short course of midodrine.  Her blood pressure has stabilized over time, and in fact has required resumption of her preoperative Norvasc for some hypertension.  All routine lines, monitors and drainage devices have been discontinued in the standard fashion.  She has had some postoperative confusion which appears to be resolving over time.  There is definitely a sundowning component to it but she is reorienting without significant difficulty.  She has a significant amount of physical deconditioning preoperatively and is requiring physical therapy and cardiac rehab to assist with postoperative convalescence.  She will also require home health PT and OT to assist with rehabilitation.  She has maintained normal sinus rhythm.  She did have some postoperative volume overload but has responded well to diuretics.  She had a short-term elevation of her creatinine to a peak of 1.8, but with diuresis this has continued steady improvement.  Most recent creatinine is 1.2.  She is not felt to be a candidate for resuming her ARB at this time but may require it as an outpatient if she remains hypertensive as she continues to stabilize her renal function.  She does have an acute blood loss anemia which is stabilized.  Most recent  hemoglobin and hematocrit are 7.7/24.8.  Blood sugars have been under good control using standard protocols and at time of discharge if her renal function remains stable she will be able to resume her Glucophage.  Incisions are all noted to be healing well without  evidence of infection.  She is tolerating diet.  Oxygen has been weaned and she maintains good saturations on room air.At time of d/c she is quite stable.  Consults: cardiology  Significant Diagnostic Studies: routine serial labs and CXR's  Treatments: surgery:  [address] [place] [address] [phone]    02/08/2020 Patient:  Bianca [name] Pre-Op Dx:     Unstable angina                         3V CAD                         DM   Post-op Dx:  same Procedure: CABG X 4.  LIMA LAD, RSVG, D1, OM2, RI Coronary endarterectomy of D1   Endoscopic greater saphenous vein harvest on the right Intra-operative Transesophageal Echocardiogram  Surgeon and Role:      * Lightfoot, Lucile Crater, MD - Primary    * T. Harriet Pho, [facility] - assisting   Anesthesia  general EBL:  500 ml Blood Administration: 1unit of pRBCs Xclamp Time:  87 min Pump Time:  147mn  LEFT HEART CATH AND CORONARY ANGIOGRAPHY  Conclusion    Ost LM lesion is 40% stenosed.  Ramus lesion is 90% stenosed.  LPAV lesion is 90% stenosed.  2nd Mrg-1 lesion is 95% stenosed.  2nd Mrg-2 lesion is 90% stenosed.  1st Diag lesion is 90% stenosed.  Dist LAD lesion is 90% stenosed.  2nd Diag lesion is 90% stenosed.  Prox LAD lesion is 60% stenosed.  Mid LAD lesion is 90% stenosed.   Bianca [name] a 74y.o. female    [id number]:  [facility] PH[facility]ICIAN: JQuay Burow M.D. 51947/11/08  DATE OF PROCEDURE:  01/31/2020  DATE OF DISCHARGE:     CARDIAC CATHETERIZATION     History obtained from chart review.Bianca [name] a 74y.o.thin appearing divorced/widowed African-American female mother of 2 children, grandmother of 1 grandchild who is accompanied by her daughterJoeletta.She is referred by Dr. FMardene Speak cardiologist, for diagnostic coronary angiography. She has a history of greater than 100  pack years of tobacco abuse having quit recently during hospitalization. She has treated hypertension, diabetes and hyperlipidemia as well as family history with 2 brothers who have had stents. [facility] urgency and pulmonary edema. She was placed on a nitro drip and diuresed. 2D echo revealed no low normal LV function with normal valvular function. She did leave AMA. She has been having atypical left lateral chest and back pain. Her echo was reviewed by her cardiologist who thought that she had subtle inferior hypokinesia and given her symptoms and risk factors recommended that she undergo diagnostic coronary angiography.   IMPRESSION: Bianca [name] a left dominant system with high-grade disease in her proximal LAD, first and second diagonal branches, ramus branch, circumflex obtuse marginal branch and distal circumflex.  She has low normal LV function by recent 2D echo.  Her anatomy is suitable for CABG.  Sheath was removed and a TR band was placed on the right wrist to achieve patent hemostasis.  The patient left  lab in stable condition.  T CTS has been consulted.  Quay Burow. MD, [facility] 01/31/2020 4:51 PM      Recommendations  Antiplatelet/Anticoag Recommend Aspirin '81mg'$  daily for moderate CAD.  Surgeon Notes    02/08/2020 1:52 PM Op Note signed by Lajuana Matte, MD    02/08/2020 1:27 PM Operative Note - Scan signed by Default, Provider, MD  Procedural Details  Technical Details PROCEDURE DESCRIPTION:   The patient was brought to the second floor [place] Cardiac cath lab in the postabsorptive state.  She was premedicated with IV Versed and fentanyl.  Her right wristwas prepped and shaved in usual sterile fashion. Xylocaine 1% was used for local anesthesia. A 6 French sheath was inserted into the right radial artery using standard Seldinger technique. The patient received 3500 units  of heparin  intravenously.  A 5 [place] TIG catheter and left Judkins catheter were used for selective coronary angiography and obtain left heart pressures.  Isovue dye was used for the entirety of the case.  Retrograde aortic, left ventricular and pullback pressures were recorded.  Radial cocktail was administered via the SideArm sheath. Estimated blood loss <50 mL.   During this procedure medications were administered to achieve and maintain moderate conscious sedation while the patient's heart rate, blood pressure, and oxygen saturation were continuously monitored and I was present face-to-face 100% of this time.  Medications (Filter: Administrations occurring from 01/31/20 1555 to 01/31/20 1643) (important)  Continuous medications are totaled by the amount administered until 01/31/20 1643.  fentaNYL (SUBLIMAZE) injection (mcg) Total dose:  25 mcg  Date/Time  Rate/Dose/Volume Action  01/31/20 1604  25 mcg Given    midazolam (VERSED) injection (mg) Total dose:  1 mg  Date/Time  Rate/Dose/Volume Action  01/31/20 1604  1 mg Given    lidocaine (PF) (XYLOCAINE) 1 % injection (mL) Total volume:  4 mL  Date/Time  Rate/Dose/Volume Action  01/31/20 1609  4 mL Given    Radial Cocktail (Verapamil 2.5 mg, NTG, Lidocaine) (mL) Total volume:  7 mL  Date/Time  Rate/Dose/Volume Action  01/31/20 1611  7 mL Given    heparin sodium (porcine) injection (Units) Total dose:  3,500 Units  Date/Time  Rate/Dose/Volume Action  01/31/20 1615  3,500 Units Given    Heparin (Porcine) in NaCl 1000-0.9 UT/500ML-% SOLN (mL) Total volume:  500 mL  Date/Time  Rate/Dose/Volume Action  01/31/20 1633  500 mL Given    Heparin (Porcine) in NaCl 1000-0.9 UT/500ML-% SOLN (mL) Total volume:  500 mL  Date/Time  Rate/Dose/Volume Action  01/31/20 1633  500 mL Given    iohexol (OMNIPAQUE) 350 MG/ML injection (mL) Total volume:  100 mL  Date/Time  Rate/Dose/Volume Action  01/31/20 1633  100 mL Given    Sedation  Time  Sedation Time Physician-1: 24 minutes 50 seconds  Contrast  Medication Name Total Dose  iohexol (OMNIPAQUE) 350 MG/ML injection 100 mL    Radiation/Fluoro  Fluoro time: 7.1 (min) DAP: 12148 (mGycm2) Cumulative Air Kerma: 252 (mGy)  Coronary Findings  Diagnostic Dominance: Left Left Main  Ost LM lesion 40% stenosed  Ost LM lesion is 40% stenosed.  Left Anterior Descending  Prox LAD lesion 60% stenosed  Prox LAD lesion is 60% stenosed.  Mid LAD lesion 90% stenosed  Mid LAD lesion is 90% stenosed.  Dist LAD lesion 90% stenosed  Dist LAD lesion is 90% stenosed.  First Diagonal Branch  1st Diag lesion 90% stenosed  1st Diag lesion is 90% stenosed.  Second Diagonal  Branch  2nd Diag lesion 90% stenosed  2nd Diag lesion is 90% stenosed.  Ramus Intermedius  Ramus lesion 90% stenosed  Ramus lesion is 90% stenosed.  Left Circumflex  Second Obtuse Marginal Branch  2nd Mrg-1 lesion 95% stenosed  2nd Mrg-1 lesion is 95% stenosed.  2nd Mrg-2 lesion 90% stenosed  2nd Mrg-2 lesion is 90% stenosed.  Left Posterior Atrioventricular Artery  LPAV lesion 90% stenosed  LPAV lesion is 90% stenosed.  Intervention  No interventions have been documented. Coronary Diagrams  Diagnostic Dominance: Left    Discharge Exam: Blood pressure (!) 142/74, pulse 89, temperature 98.3 F (36.8 C), temperature source Oral, resp. rate (!) 21, height '5\' 6"'$  (1.676 m), weight 74.5 kg, SpO2 98 %.   General appearance: alert, cooperative and no distress Heart: regular rate and rhythm Lungs: dim in bases Abdomen: benign Extremities: reace edema Wound: incis healing well Disposition:  Discharge disposition: 01-Home or Self Care       Discharge Instructions    Discharge patient   Complete by: As directed    Discharge disposition: 01-Home or Self Care   Discharge patient date: 02/15/2020     Allergies as of 02/15/2020      Reactions   Tramadol Nausea And Vomiting      Medication  List    STOP taking these medications   isosorbide mononitrate 30 MG 24 hr tablet Commonly known as: IMDUR   nitroGLYCERIN 0.4 MG SL tablet Commonly known as: Nitrostat     TAKE these medications   Adult Blood Pressure Cuff Lg Kit Use to check blood pressure twice a day, keep a running log of your blood pressures   amLODipine 5 MG tablet Commonly known as: NORVASC Take 5 mg by mouth daily.   aspirin 325 MG EC tablet Take 1 tablet (325 mg total) by mouth daily. Start taking on: Feb 16, 2020 What changed:   medication strength  how much to take   atorvastatin 80 MG tablet Commonly known as: LIPITOR Take 1 tablet (80 mg total) by mouth daily.   bismuth subsalicylate 921 JH/41DE suspension Commonly known as: PEPTO BISMOL Take 30 mLs by mouth every 6 (six) hours as needed for indigestion.   calcium carbonate 500 MG chewable tablet Commonly known as: TUMS - dosed in mg elemental calcium Chew 1 tablet by mouth daily as needed for indigestion or heartburn.   carvedilol 25 MG tablet Commonly known as: COREG Take 1 tablet (25 mg total) by mouth 2 (two) times daily with a meal.   furosemide 40 MG tablet Commonly known as: LASIX Take 1 tablet (40 mg total) by mouth daily. Start taking on: Feb 16, 2020 What changed:   medication strength  how much to take   glucose blood test strip Commonly known as: Contour Next Test Use as instructed   glucose monitoring kit monitoring kit 1 each by Does not apply route 2 (two) times daily.   losartan 50 MG tablet Commonly known as: COZAAR Take 1 tablet (50 mg total) by mouth daily. Start taking on: Feb 16, 2020 What changed:   medication strength  how much to take   metFORMIN 1000 MG tablet Commonly known as: GLUCOPHAGE TAKE 1 TABLET(1000 MG) BY MOUTH TWICE DAILY What changed: See the new instructions.   Microlet Lancets Misc USE TO TEST BLOOD SUGAR ONCE DAILY   potassium chloride SA 20 MEQ tablet Commonly known as:  KLOR-CON Take 2 tablets (40 mEq total) by mouth daily. Start taking on: Feb 16, 2020            Durable Medical Equipment  (From admission, onward)         Start     Ordered   02/14/20 0717  For home use only DME 3 n 1  Once     02/14/20 0716   02/14/20 0717  For home use only DME Walker rolling  Once    Question Answer Comment  Walker: With [place] Wheels   Patient needs a walker to treat with the following condition Physical deconditioning      02/14/20 0716         Follow-up Information    Lajuana Matte, MD Follow up.   Specialty: Cardiothoracic Surgery Why: Please see discharge paperwork for follow-up appointment with your surgeon. Contact information: [address] [place] [address] [phone]        Deberah Pelton, NP Follow up.   Specialty: Cardiology Why: Please see discharge paperwork for follow-up appointment with cardiology Contact information: [address] [place] [address] [phone]        [facility], [facility] Follow up.   Why: Rolling walker and 3n1 arranged- to be delivered to room prior to discharge Contact information: [place] [address] [phone]        [facility], [facility] Follow up.   Specialty: [place] Why: HHPT/OT arranged- they will contact you to set up home visits  Contact information: [place] [address] [phone]           Signed: John Giovanni 02/15/2020, 9:25 AM

## 2020-02-11 NOTE — Progress Notes (Signed)
      [address]       [place] [address]             [phone]                 3 Days Post-Op Procedure(s) (LRB): CORONARY ARTERY BYPASS GRAFTING (CABG) TIMES FOUR USING LEFT INTERNAL MAMMARY ARTERY AND RIGHT SAPHENOUS LEG VEIN HARVESTED ENDOSCOPICALLY (N/A) TRANSESOPHAGEAL ECHOCARDIOGRAM (TEE) (N/A)   Events: No events _______________________________________________________________ Vitals: BP (!) 117/105   Pulse 76   Temp 97.8 F (36.6 C) (Oral)   Resp 19   Ht 5\' 6"  (1.676 m)   Wt 79.6 kg   SpO2 94%   BMI 28.31 kg/m   - Neuro: alert NAD  - Cardiovascular: sinus  Drips: neo 15   CVP:  [12 mmHg-26 mmHg] 22 mmHg  - Pulm: EWOB  ABG    Component Value Date/Time   PHART 7.342 (L) 02/08/2020 2029   PCO2ART 39.7 02/08/2020 2029   PO2ART 146 (H) 02/08/2020 2029   HCO3 21.7 02/08/2020 2029   TCO2 23 02/08/2020 2029   ACIDBASEDEF 4.0 (H) 02/08/2020 2029   O2SAT 99.0 02/08/2020 2029    - Abd: soft - Extremity: trace edema  .Intake/Output      05/02 0701 - 05/03 0700 05/03 0701 - 05/04 0700   P.O. 900    I.V. (mL/kg) 1443.6 (18.1) 13.6 (0.2)   Other 500    IV Piggyback 510.9    Total Intake(mL/kg) 3354.5 (42.1) 13.6 (0.2)   Urine (mL/kg/hr) 250 (0.1)    Chest Tube 0    Total Output 250    Net +3104.5 +13.6           _______________________________________________________________ Labs: CBC Latest Ref Rng & Units 02/11/2020 02/10/2020 02/09/2020  WBC 4.0 - 10.5 K/uL 10.1 13.0(H) 12.2(H)  Hemoglobin 12.0 - 15.0 g/dL 7.2(L) 8.1(L) 8.3(L)  Hematocrit 36.0 - 46.0 % 23.9(L) 25.7(L) 26.8(L)  Platelets 150 - 400 K/uL 80(L) 84(L) 61(L)   CMP Latest Ref Rng & Units 02/11/2020 02/10/2020 02/09/2020  Glucose 70 - 99 mg/dL 04/10/2020) 322(G) 254(Y)  BUN 8 - 23 mg/dL 706(C) 37(S) 21  Creatinine 0.44 - 1.00 mg/dL 28(B) 1.51(V) 6.16(W)  Sodium 135 - 145 mmol/L 140 139 140  Potassium 3.5 - 5.1 mmol/L 4.3 4.4 4.5  Chloride 98 - 111 mmol/L 111 112(H) 112(H)  CO2  22 - 32 mmol/L 23 21(L) 21(L)  Calcium 8.9 - 10.3 mg/dL 7.8(L) 7.8(L) 8.0(L)  Total Protein 6.0 - 8.5 g/dL - - -  Total Bilirubin 0.0 - 1.2 mg/dL - - -  Alkaline Phos 39 - 117 IU/L - - -  AST 0 - 40 IU/L - - -  ALT 0 - 32 IU/L - - -    CXR: PV congestion  _______________________________________________________________  Assessment and Plan: POD 3 s/p CABG  Neuro: pain controlled CV: BP much better today.  Will continue to wean neo.  Once off will remove CVL Pulm: continue pulm toilet Renal: decreased uop, creat continues to go up.  Will give lasix challenge.   GI: advancing diet.  Some nausea Heme: stable.  Low plts, will follow ID: afebrile Endo: SSI  Dispo: continue ICU care  7.37(T, MD 02/11/2020 9:14 AM

## 2020-02-11 NOTE — Plan of Care (Signed)

## 2020-02-11 NOTE — Discharge Instructions (Signed)
Endoscopic Saphenous Vein Harvesting, Care After This sheet gives you information about how to care for yourself after your procedure. Your health care provider may also give you more specific instructions. If you have problems or questions, contact your health care provider. What can I expect after the procedure? After the procedure, it is common to have:  Pain.  Bruising.  Swelling.  Numbness. Follow these instructions at home: Incision care   Follow instructions from your health care provider about how to take care of your incisions. Make sure you: ? Wash your hands with soap and water before and after you change your bandages (dressings). If soap and water are not available, use hand sanitizer. ? Change your dressings as told by your health care provider. ? Leave stitches (sutures), skin glue, or adhesive strips in place. These skin closures may need to stay in place for 2 weeks or longer. If adhesive strip edges start to loosen and curl up, you may trim the loose edges. Do not remove adhesive strips completely unless your health care provider tells you to do that.  Check your incision areas every day for signs of infection. Check for: ? More redness, swelling, or pain. ? Fluid or blood. ? Warmth. ? Pus or a bad smell. Medicines  Take over-the-counter and prescription medicines only as told by your health care provider.  Ask your health care provider if the medicine prescribed to you requires you to avoid driving or using heavy machinery. General instructions  Raise (elevate) your legs above the level of your heart while you are sitting or lying down.  Avoid crossing your legs.  Avoid sitting for long periods of time. Change positions every 30 minutes.  Do any exercises your health care providers have given you. These may include deep breathing, coughing, and walking exercises.  Do not take baths, swim, or use a hot tub until your health care provider approves. Ask your  health care provider if you may take showers. You may only be allowed to take sponge baths.  Wear compression stockings as told by your health care provider. These stockings help to prevent blood clots and reduce swelling in your legs.  Keep all follow-up visits as told by your health care provider. This is important. Contact a health care provider if:  Medicine does not help your pain.  Your pain gets worse.  You have new leg bruises or your leg bruises get bigger.  Your leg feels numb.  You have more redness, swelling, or pain around your incision.  You have fluid or blood coming from your incision.  Your incision feels warm to the touch.  You have pus or a bad smell coming from your incision.  You have a fever. Get help right away if:  Your pain is severe.  You develop pain, tenderness, warmth, redness, or swelling in any part of your leg.  You have chest pain.  You have trouble breathing. Summary  Raise (elevate) your legs above the level of your heart while you are sitting or lying down.  Wear compression stockings as told by your health care provider.  Make sure you know which symptoms should prompt you to contact your health care provider.  Keep all follow-up visits as told by your health care provider. This information is not intended to replace advice given to you by your health care provider. Make sure you discuss any questions you have with your health care provider. Document Revised: 09/04/2018 Document Reviewed: 09/04/2018 Elsevier Patient Education    2020 [employer] Coronary Artery Bypass Grafting, Care After This sheet gives you information about how to care for yourself after your procedure. Your doctor may also give you more specific instructions. If you have problems or questions, call your doctor. What can I expect after the procedure? After the procedure, it is common to:  Feel sick to your stomach (nauseous).  Not want to eat as much as  normal (lack of appetite).  Have trouble pooping (constipation).  Have weakness and tiredness (fatigue).  Feel sad (depressed) or grouchy (irritable).  Have pain or discomfort around the cuts from surgery (incisions). Follow these instructions at home: Medicines  Take over-the-counter and prescription medicines only as told by your doctor. Do not stop taking medicines or start any new medicines unless your doctor says it is okay.  If you were prescribed an antibiotic medicine, take it as told by your doctor. Do not stop taking the antibiotic even if you start to feel better. Incision care   Follow instructions from your doctor about how to take care of your cuts from surgery. Make sure you: ? Wash your hands with soap and water before and after you change your bandage (dressing). If you cannot use soap and water, use hand sanitizer. ? Change your bandage as told by your doctor. ? Leave stitches (sutures), skin glue, or skin tape (adhesive) strips in place. They may need to stay in place for 2 weeks or longer. If tape strips get loose and curl up, you may trim the loose edges. Do not remove tape strips completely unless your doctor says it is okay.  Make sure the surgery cuts are clean, dry, and protected.  Check your cut areas every day for signs of infection. Check for: ? More redness, swelling, or pain. ? More fluid or blood. ? Warmth. ? Pus or a bad smell.  If cuts were made in your legs: ? Avoid crossing your legs. ? Avoid sitting for long periods of time. Change positions every 30 minutes. ? Raise (elevate) your legs when you are sitting. Bathing  Do not take baths, swim, or use a hot tub until your doctor says it is okay.  You may take a shower Pat the surgery cuts dry. Do not rub the cuts to dry. Eating and drinking   Eat foods that are high in fiber, such as beans, nuts, whole grains, and raw fruits and vegetables. Any meats you eat should be lean cut. Avoid canned,  processed, and fried foods. This can help prevent trouble pooping. This is also a part of a heart-healthy diet.  Drink enough fluid to keep your pee (urine) pale yellow.  Do not drink alcohol until you are fully recovered. Ask your doctor when it is safe to drink alcohol. Activity  Rest and limit your activity as told by your doctor. You may be told to: ? Stop any activity right away if you have chest pain, shortness of breath, irregular heartbeats, or dizziness. Get help right away if you have any of these symptoms. ? Move around often for short periods or take short walks as told by your doctor. Slowly increase your activities. ? Avoid lifting, pushing, or pulling anything that is heavier than 10 lb (4.5 kg) for at least 6 weeks or as told by your doctor.  Do physical therapy or a cardiac rehab (cardiac rehabilitation) program as told by your doctor. ? Physical therapy involves doing exercises to maintain movement and build strength and endurance. ? A cardiac   rehab program includes:  Exercise training.  Education.  Counseling.  Do not drive until your doctor says it is okay.  Ask your doctor when you can go back to work.  Ask your doctor when you can be sexually active. General instructions  Do not drive or use heavy machinery while taking prescription pain medicine.  Do not use any products that contain nicotine or tobacco. These include cigarettes, e-cigarettes, and chewing tobacco. If you need help quitting, ask your doctor.  Take 2-3 deep breaths every few hours during the day while you get better. This helps expand your lungs and prevent problems.  If you were given a device called an incentive spirometer, use it several times a day to practice deep breathing. Support your chest with a pillow or your arms when you take deep breaths or cough.  Wear compression stockings as told by your doctor.  Weigh yourself every day. This helps to see if your body is holding  (retaining) fluid that may make your heart and lungs work harder.  Keep all follow-up visits as told by your doctor. This is important. Contact a doctor if:  You have more redness, swelling, or pain around any cut.  You have more fluid or blood coming from any cut.  Any cut feels warm to the touch.  You have pus or a bad smell coming from any cut.  You have a fever.  You have swelling in your ankles or legs.  You have pain in your legs.  You gain 2 lb (0.9 kg) or more a day.  You feel sick to your stomach or you throw up (vomit).  You have watery poop (diarrhea). Get help right away if:  You have chest pain that goes to your jaw or arms.  You are short of breath.  You have a fast or irregular heartbeat.  You notice a "clicking" in your breastbone (sternum) when you move.  You have any signs of a stroke. "BE FAST" is an easy way to remember the main warning signs: ? B - Balance. Signs are dizziness, sudden trouble walking, or loss of balance. ? E - Eyes. Signs are trouble seeing or a change in how you see. ? F - Face. Signs are sudden weakness or loss of feeling of the face, or the face or eyelid drooping on one side. ? A - Arms. Signs are weakness or loss of feeling in an arm. This happens suddenly and usually on one side of the body. ? S - Speech. Signs are sudden trouble speaking, slurred speech, or trouble understanding what people say. ? T - Time. Time to call emergency services. Write down what time symptoms started.  You have other signs of a stroke, such as: ? A sudden, very bad headache with no known cause. ? Feeling sick to your stomach. ? Throwing up. ? Jerky movements you cannot control (seizure). These symptoms may be an emergency. Do not wait to see if the symptoms will go away. Get medical help right away. Call your local emergency services (911 in the [place] Do not drive yourself to the hospital. Summary  After the procedure, it is common to have pain  or discomfort in the cuts from surgery (incisions).  Do not take baths, swim, or use a hot tub until your doctor says it is okay.  Slowly increase your activities. You may need physical therapy or cardiac rehab.  Weigh yourself every day. This helps to see if your body is holding fluid.   This information is not intended to replace advice given to you by your health care provider. Make sure you discuss any questions you have with your health care provider. Document Revised: 06/06/2018 Document Reviewed: 06/06/2018 Elsevier Patient Education  2020 [employer]  

## 2020-02-12 ENCOUNTER — Ambulatory Visit: Payer: Medicare Other | Admitting: Podiatry

## 2020-02-12 ENCOUNTER — Inpatient Hospital Stay (HOSPITAL_COMMUNITY): Payer: Medicare Other

## 2020-02-12 LAB — CBC
HCT: 24.8 % — ABNORMAL LOW (ref 36.0–46.0)
Hemoglobin: 7.7 g/dL — ABNORMAL LOW (ref 12.0–15.0)
MCH: 28.1 pg (ref 26.0–34.0)
MCHC: 31 g/dL (ref 30.0–36.0)
MCV: 90.5 fL (ref 80.0–100.0)
Platelets: 112 10*3/uL — ABNORMAL LOW (ref 150–400)
RBC: 2.74 MIL/uL — ABNORMAL LOW (ref 3.87–5.11)
RDW: 15.5 % (ref 11.5–15.5)
WBC: 9.3 10*3/uL (ref 4.0–10.5)
nRBC: 0.3 % — ABNORMAL HIGH (ref 0.0–0.2)

## 2020-02-12 LAB — TYPE AND SCREEN
ABO/RH(D): O POS
Antibody Screen: NEGATIVE

## 2020-02-12 LAB — GLUCOSE, CAPILLARY
Glucose-Capillary: 125 mg/dL — ABNORMAL HIGH (ref 70–99)
Glucose-Capillary: 127 mg/dL — ABNORMAL HIGH (ref 70–99)
Glucose-Capillary: 129 mg/dL — ABNORMAL HIGH (ref 70–99)
Glucose-Capillary: 134 mg/dL — ABNORMAL HIGH (ref 70–99)
Glucose-Capillary: 135 mg/dL — ABNORMAL HIGH (ref 70–99)
Glucose-Capillary: 139 mg/dL — ABNORMAL HIGH (ref 70–99)

## 2020-02-12 LAB — BASIC METABOLIC PANEL
Anion gap: 10 (ref 5–15)
BUN: 50 mg/dL — ABNORMAL HIGH (ref 8–23)
CO2: 21 mmol/L — ABNORMAL LOW (ref 22–32)
Calcium: 7.9 mg/dL — ABNORMAL LOW (ref 8.9–10.3)
Chloride: 109 mmol/L (ref 98–111)
Creatinine, Ser: 1.63 mg/dL — ABNORMAL HIGH (ref 0.44–1.00)
GFR calc Af Amer: 36 mL/min — ABNORMAL LOW (ref >60)
GFR calc non Af Amer: 31 mL/min — ABNORMAL LOW (ref >60)
Glucose, Bld: 135 mg/dL — ABNORMAL HIGH (ref 70–99)
Potassium: 3.7 mmol/L (ref 3.5–5.1)
Sodium: 140 mmol/L (ref 135–145)

## 2020-02-12 MED ORDER — INSULIN ASPART 100 UNIT/ML ~~LOC~~ SOLN
0.0000 [IU] | Freq: Three times a day (TID) | SUBCUTANEOUS | Status: DC
Start: 2020-02-12 — End: 2020-02-12

## 2020-02-12 MED ORDER — FUROSEMIDE 40 MG PO TABS
40.0000 mg | ORAL_TABLET | Freq: Every day | ORAL | Status: DC
  Administered 2020-02-12 – 2020-02-15 (×4): 40 mg via ORAL
  Filled 2020-02-12 (×4): qty 1

## 2020-02-12 MED ORDER — INSULIN ASPART 100 UNIT/ML ~~LOC~~ SOLN
0.0000 [IU] | Freq: Three times a day (TID) | SUBCUTANEOUS | Status: DC
  Administered 2020-02-12 – 2020-02-13 (×5): 2 [IU] via SUBCUTANEOUS
  Administered 2020-02-14: 8 [IU] via SUBCUTANEOUS
  Administered 2020-02-14 (×2): 2 [IU] via SUBCUTANEOUS

## 2020-02-12 NOTE — Progress Notes (Addendum)
Pt arrived to rm 4E22 from 2H. Initiated tele. VSS. CHG bath and assessment performed. Bed alarm on. Call bell within reach.   Lawson Radar, RN

## 2020-02-12 NOTE — Progress Notes (Addendum)
[address]       [place] [address]             [phone]      4 Days Post-Op Procedure(s) (LRB): CORONARY ARTERY BYPASS GRAFTING (CABG) TIMES FOUR USING LEFT INTERNAL MAMMARY ARTERY AND RIGHT SAPHENOUS LEG VEIN HARVESTED ENDOSCOPICALLY (N/A) TRANSESOPHAGEAL ECHOCARDIOGRAM (TEE) (N/A) Subjective: Feeling a little better  Objective: Vital signs in last 24 hours: Temp:  [97.5 F (36.4 C)-98.9 F (37.2 C)] 98.8 F (37.1 C) (05/04 0738) Pulse Rate:  [66-76] 75 (05/04 1011) Cardiac Rhythm: Normal sinus rhythm (05/04 0800) Resp:  [12-23] 23 (05/04 1011) BP: (87-148)/(51-109) 148/64 (05/04 1011) SpO2:  [94 %-99 %] 99 % (05/04 1011) Weight:  [78.6 kg] 78.6 kg (05/04 0500)  Hemodynamic parameters for last 24 hours: CVP:  [9 mmHg-21 mmHg] 9 mmHg  Intake/Output from previous day: 05/03 0701 - 05/04 0700 In: 381.4 [P.O.:120; I.V.:261.4] Out: 600 [Urine:600] Intake/Output this shift: Total I/O In: 261.4 [P.O.:240; I.V.:21.4] Out: -   General appearance: cooperative, distracted, fatigued and no distress Heart: regular rate and rhythm Lungs: mildly dim in bases Abdomen: soft, nontender Extremities: minor edema Wound: incis healing well  Lab Results: Recent Labs    02/11/20 0420 02/12/20 0449  WBC 10.1 9.3  HGB 7.2* 7.7*  HCT 23.9* 24.8*  PLT 80* 112*   BMET:  Recent Labs    02/11/20 0420 02/12/20 0449  NA 140 140  K 4.3 3.7  CL 111 109  CO2 23 21*  GLUCOSE 126* 135*  BUN 50* 50*  CREATININE 1.80* 1.63*  CALCIUM 7.8* 7.9*    PT/INR: No results for input(s): LABPROT, INR in the last 72 hours. ABG    Component Value Date/Time   PHART 7.342 (L) 02/08/2020 2029   HCO3 21.7 02/08/2020 2029   TCO2 23 02/08/2020 2029   ACIDBASEDEF 4.0 (H) 02/08/2020 2029   O2SAT 99.0 02/08/2020 2029   CBG (last 3)  Recent Labs    02/12/20 0022 02/12/20 0448 02/12/20 0734  GLUCAP 129* 125* 127*    Meds Scheduled Meds: . acetaminophen  1,000  mg Oral Q6H   Or  . acetaminophen (TYLENOL) oral liquid 160 mg/5 mL  1,000 mg Per Tube Q6H  . aspirin EC  325 mg Oral Daily   Or  . aspirin  324 mg Per Tube Daily  . atorvastatin  80 mg Oral Daily  . bisacodyl  10 mg Oral Daily   Or  . bisacodyl  10 mg Rectal Daily  . Chlorhexidine Gluconate Cloth  6 each Topical Once  . docusate sodium  200 mg Oral Daily  . furosemide  40 mg Oral Daily  . insulin aspart  0-24 Units Subcutaneous Q4H  . mouth rinse  15 mL Mouth Rinse BID  . metoprolol tartrate  12.5 mg Oral BID   Or  . metoprolol tartrate  12.5 mg Per Tube BID  . midodrine  10 mg Oral TID WC  . pantoprazole  40 mg Oral Daily  . sodium chloride flush  10-40 mL Intracatheter Q12H   Continuous Infusions: . lactated ringers    . lactated ringers Stopped (02/12/20 0908)  . phenylephrine Stopped (02/11/20 1631)   PRN Meds:.dextrose, lactated ringers, metoprolol tartrate, morphine injection, ondansetron (ZOFRAN) IV, oxyCODONE, sodium chloride flush, sodium chloride flush  Xrays DG Chest Port 1 View  Result Date: 02/12/2020 CLINICAL DATA:  Sore chest. EXAM: PORTABLE CHEST 1 VIEW COMPARISON:  02/10/2020. FINDINGS: Right IJ line  noted stable position. Interim removal of mediastinal drainage catheter left chest tube. Prior CABG. Cardiomegaly. No pulmonary venous congestion. Low lung volumes with bibasilar atelectasis. Mild bibasilar infiltrates cannot be excluded. Small left pleural effusion cannot be excluded. No right pleural effusion noted on today's exam. No pneumothorax. IMPRESSION: 1. Right IJ line in stable position. Interim removal of mediastinal drainage catheter and left chest tube. No pneumothorax. 2. Prior CABG. Stable cardiomegaly. No pulmonary venous congestion. Three low lung volumes with bibasilar atelectasis/infiltrates again noted. Small left pleural effusion cannot be excluded. No right pleural effusion noted on today's exam. Electronically Signed   By: Marcello Moores  Register   On:  02/12/2020 08:53    Assessment/Plan: S/P Procedure(s) (LRB): CORONARY ARTERY BYPASS GRAFTING (CABG) TIMES FOUR USING LEFT INTERNAL MAMMARY ARTERY AND RIGHT SAPHENOUS LEG VEIN HARVESTED ENDOSCOPICALLY (N/A) TRANSESOPHAGEAL ECHOCARDIOGRAM (TEE) (N/A)  1 stable off neo, SBP is pretty variable, may be able to stop midodrine soon. No ARB yet- was on preop. No fevers 2 sinus rhythm 3 sats good on RA 4 CXR stable, some atx/imfilt- cont pulm toilet and mobilize- PT assisting 5 ABL anemia is stable, fluid equalibrating. Still with some volume overload. UOP has improved- cont lasix, Creat peaked at 1.8- now trending down(1.6) 6 BS adeq control for hospitalized patient. Hopefully with improved renal fxn metformin can be resumed soon     LOS: 9 days    John Giovanni PA-C Pager [phone] 02/12/2020   Doing better this morning. Creatinine is down and she responded to Lasix appropriately. We will transfer to the floor.  Lancer Thurner Bary Leriche

## 2020-02-12 NOTE — Evaluation (Signed)
Physical Therapy Evaluation Patient Details Name: Bianca [name] MRN: [id number] DOB: 03/02/1946 Today's Date: 02/12/2020   History of Present Illness  74 yo admitted with chest pain and [place]. Pt with MI in 12/2019 who refused CABG and had cath 01/31/20. Pt now s/p CABG x 4 4/30. PMhx: HTN, HLD, DM, CAD, tobacco use  Clinical Impression  Pt sitting in chair on arrival unable to state place, situation or PLOF. Pt educated for sternal precautions, transfers and gait with ability to walk in hall with chair follow. PT with decreased cognition, strength, balance, functional mobility who will benefit from acute therapy to maximize function and independence adhering to precautions.  Pre 120/56 (73) Post 127/115 (121) Hr 76-90 95-99% on RA    Follow Up Recommendations SNF;Supervision/Assistance - 24 hour    Equipment Recommendations  Rolling walker with 5" wheels;3in1 (PT)    Recommendations for Other Services OT consult     Precautions / Restrictions Precautions Precautions: Sternal;Fall Precaution Booklet Issued: No Restrictions       Mobility  Bed Mobility               General bed mobility comments: in chair before and after session  Transfers Overall transfer level: Needs assistance   Transfers: Sit to/from Stand Sit to Stand: Min assist;+2 safety/equipment         General transfer comment: cues for hand placement with assist to rise from surface, max assist to scoot forward and back in chair  Ambulation/Gait Ambulation/Gait assistance: Min assist;+2 physical assistance Gait Distance (Feet): 130 Feet Assistive device: Rolling walker (2 wheeled) Gait Pattern/deviations: Step-through pattern;Decreased stride length;Trunk flexed   Gait velocity interpretation: 1.31 - 2.62 ft/sec, indicative of limited community ambulator General Gait Details: cues for posture, position in RW and safety. Assist to direct RW with close chair follow. pt unable to self regulate  activity  Stairs            Wheelchair Mobility    Modified Rankin (Stroke Patients Only)       Balance Overall balance assessment: Needs assistance   Sitting balance-Leahy Scale: Fair     Standing balance support: Bilateral upper extremity supported Standing balance-Leahy Scale: Poor                               Pertinent Vitals/Pain Pain Assessment: No/denies pain    Home Living Family/patient expects to be discharged to:: Private residence                      Prior Function           Comments: pt unable to recall who she lives with, if she has DME or PLOF. Attempted to call both son and daughter without answer     Hand Dominance        Extremity/Trunk Assessment   Upper Extremity Assessment Upper Extremity Assessment: Generalized weakness    Lower Extremity Assessment Lower Extremity Assessment: Generalized weakness    Cervical / Trunk Assessment Cervical / Trunk Assessment: Kyphotic  Communication      Cognition Arousal/Alertness: Awake/alert Behavior During Therapy: WFL for tasks assessed/performed Overall Cognitive Status: Impaired/Different from baseline Area of Impairment: Orientation;Memory;Safety/judgement;Awareness;Problem solving                 Orientation Level: Disoriented to;Time;Place;Situation   Memory: Decreased short-term memory;Decreased recall of precautions   Safety/Judgement: Decreased awareness of safety;Decreased awareness of deficits Awareness: Intellectual Problem  Solving: Slow processing;Decreased initiation;Requires verbal cues;Requires tactile cues General Comments: Pt oriented to self only. pt educated for sternal precautions but unable to recall and needed cues for all mobility      General Comments      Exercises     Assessment/Plan    PT Assessment Patient needs continued PT services  PT Problem List Decreased strength;Decreased mobility;Decreased safety  awareness;Decreased activity tolerance;Decreased balance;Decreased cognition;Decreased knowledge of use of DME;Cardiopulmonary status limiting activity;Decreased knowledge of precautions       PT Treatment Interventions Gait training;Balance training;Functional mobility training;Cognitive remediation;Therapeutic activities;Patient/family education;Stair training;DME instruction;Therapeutic exercise    PT Goals (Current goals can be found in the Care Plan section)  Acute Rehab PT Goals PT Goal Formulation: Patient unable to participate in goal setting Time For Goal Achievement: 02/26/20 Potential to Achieve Goals: Fair    Frequency Min 3X/week   Barriers to discharge Inaccessible home environment;Decreased caregiver support unsure as family not present and pt unable    Co-evaluation               AM-PAC PT "6 Clicks" Mobility  Outcome Measure Help needed turning from your back to your side while in a flat bed without using bedrails?: A Little Help needed moving from lying on your back to sitting on the side of a flat bed without using bedrails?: A Lot Help needed moving to and from a bed to a chair (including a wheelchair)?: A Little Help needed standing up from a chair using your arms (e.g., wheelchair or bedside chair)?: A Little Help needed to walk in hospital room?: A Lot Help needed climbing 3-5 steps with a railing? : Total 6 Click Score: 14    End of Session Equipment Utilized During Treatment: Gait belt Activity Tolerance: Patient tolerated treatment well Patient left: in chair;with call bell/phone within reach;with chair alarm set Nurse Communication: Mobility status PT Visit Diagnosis: Other abnormalities of gait and mobility (R26.89);Difficulty in walking, not elsewhere classified (R26.2);Muscle weakness (generalized) (M62.81)    Time: 3212-2482 PT Time Calculation (min) (ACUTE ONLY): 20 min   Charges:   PT Evaluation $PT Eval Moderate Complexity: 1 Mod           Keiffer Piper P, PT Acute Rehabilitation Services Pager: [phone] Office: [phone]   Enedina Finner Chasiti Waddington 02/12/2020, 12:46 PM

## 2020-02-12 NOTE — Progress Notes (Signed)
Chaplain engaged in initial visit with Bianca [name] and her son.  Chaplain offered support and explained the spiritual care chaplains offer.    Chaplain will follow-up as needed.

## 2020-02-13 ENCOUNTER — Ambulatory Visit: Payer: Medicare Other | Admitting: Cardiovascular Disease

## 2020-02-13 LAB — BASIC METABOLIC PANEL
Anion gap: 7 (ref 5–15)
BUN: 45 mg/dL — ABNORMAL HIGH (ref 8–23)
CO2: 23 mmol/L (ref 22–32)
Calcium: 8 mg/dL — ABNORMAL LOW (ref 8.9–10.3)
Chloride: 112 mmol/L — ABNORMAL HIGH (ref 98–111)
Creatinine, Ser: 1.55 mg/dL — ABNORMAL HIGH (ref 0.44–1.00)
GFR calc Af Amer: 38 mL/min — ABNORMAL LOW (ref >60)
GFR calc non Af Amer: 33 mL/min — ABNORMAL LOW (ref >60)
Glucose, Bld: 126 mg/dL — ABNORMAL HIGH (ref 70–99)
Potassium: 3.6 mmol/L (ref 3.5–5.1)
Sodium: 142 mmol/L (ref 135–145)

## 2020-02-13 LAB — GLUCOSE, CAPILLARY
Glucose-Capillary: 124 mg/dL — ABNORMAL HIGH (ref 70–99)
Glucose-Capillary: 125 mg/dL — ABNORMAL HIGH (ref 70–99)
Glucose-Capillary: 107 mg/dL — ABNORMAL HIGH (ref 70–99)
Glucose-Capillary: 149 mg/dL — ABNORMAL HIGH (ref 70–99)

## 2020-02-13 MED ORDER — POTASSIUM CHLORIDE CRYS ER 10 MEQ PO TBCR
10.0000 meq | EXTENDED_RELEASE_TABLET | Freq: Every day | ORAL | Status: DC
  Administered 2020-02-13: 10 meq via ORAL
  Filled 2020-02-13: qty 1

## 2020-02-13 MED ORDER — HEPARIN SODIUM (PORCINE) 5000 UNIT/ML IJ SOLN
5000.0000 [IU] | Freq: Three times a day (TID) | INTRAMUSCULAR | Status: DC
  Administered 2020-02-13 – 2020-02-14 (×3): 5000 [IU] via SUBCUTANEOUS
  Filled 2020-02-13 (×3): qty 1

## 2020-02-13 NOTE — Progress Notes (Signed)
CARDIAC REHAB PHASE I   PRE:  Rate/Rhythm: 73 SR  BP:  Supine: 115/95  Sitting:   Standing:    SaO2: 96%RA  MODE:  Ambulation: 72 ft   POST:  Rate/Rhythm: 84 SR  BP:  Supine:   Sitting: 136/74  Standing:    SaO2: 93%RA 1013-1050 Pt was incontinent of stool prior to walk. Helped RN clean pt and then put mesh panty and pad on. Pt walked 72 ft with gait belt use, rolling walker and asst x 2. Followed with rollator and pt sat at 36 ft. Pt needed reminding of sternal precautions, to look up, to walk in middle of hall and stay close to walker. Back to bed after walk and bed alarm on. Pt was incontinent of stool again. Notified NT.    Luetta Nutting, RN BSN  02/13/2020 10:45 AM

## 2020-02-13 NOTE — Evaluation (Signed)
Occupational Therapy Evaluation Patient Details Name: Bianca [name] MRN: [id number] DOB: 1946-07-22 Today's Date: 02/13/2020    History of Present Illness 74 yo admitted with chest pain and [place]. Pt with MI in 12/2019 who refused CABG and had cath 01/31/20. Pt now s/p CABG x 4 4/30. PMhx: HTN, HLD, DM, CAD, tobacco use   Clinical Impression   Pt PTA: Pt was independent with ADL and mobility; living with son, but driving and managing own medications. Pt currently limited by decreased activity tolerance, decreased mobility, decreased cognitive status, decreased ability to care for self. Pt Ox1 to self; pt unable to recall any precautions and verbalizing very little when directed a question. Pt's son was able to get pt to verbalize more. Pt modA overall for ADL at this time and constant cues to maintain sternal precautions. Pt would benefit from continued OT skilled services. OT following acutely.       Follow Up Recommendations  Home health OT;Supervision/Assistance - 24 hour    Equipment Recommendations  3 in 1 bedside commode    Recommendations for Other Services       Precautions / Restrictions Precautions Precautions: Sternal;Fall Precaution Booklet Issued: No Precaution Comments: Pt education on sternal precautions; unable to state them Restrictions Weight Bearing Restrictions: Yes Other Position/Activity Restrictions: sternal precautions      Mobility Bed Mobility Overal bed mobility: Needs Assistance Bed Mobility: Sit to Supine       Sit to supine: Min assist;HOB elevated   General bed mobility comments: Assist for UB/LB maneuvering; pt hugged heart pillow  Transfers Overall transfer level: Needs assistance Equipment used: Rolling walker (2 wheeled) Transfers: Sit to/from [facility] Sit to Stand: Min assist;From elevated surface Stand pivot transfers: Min assist       General transfer comment: Pt sit to stand from recliner    Balance  Overall balance assessment: Needs assistance   Sitting balance-Leahy Scale: Fair     Standing balance support: Bilateral upper extremity supported Standing balance-Leahy Scale: Poor                             ADL either performed or assessed with clinical judgement   ADL Overall ADL's : Needs assistance/impaired Eating/Feeding: Set up;Sitting   Grooming: Minimal assistance;Sitting   Upper Body Bathing: Minimal assistance;Sitting   Lower Body Bathing: Moderate assistance;Sitting/lateral leans;Sit to/from stand   Upper Body Dressing : Minimal assistance;Standing   Lower Body Dressing: Moderate assistance;Cueing for safety;Sitting/lateral leans;Sit to/from stand   Toilet Transfer: Minimal assistance;Stand-pivot;RW;B[employer] Armed forces technical officer Details (indicate cue type and reason): cues for hand placement Toileting- Clothing Manipulation and Hygiene: Moderate assistance;Cueing for safety;Cueing for sequencing;Sitting/lateral lean;Sit to/from stand Toileting - Clothing Manipulation Details (indicate cue type and reason): cues for hand placement     Functional mobility during ADLs: Minimal assistance;Rolling walker;Cueing for safety General ADL Comments: Pt limited by decreased activity tolerance, decreased mobility, decreased cognitive status, decreased ability to care for self.     Vision Baseline Vision/History: Wears glasses Wears Glasses: Reading only Patient Visual Report: No change from baseline Vision Assessment?: No apparent visual deficits     Perception     Praxis      Pertinent Vitals/Pain Pain Assessment: 0-10 Pain Score: 4  Pain Location: chest Pain Descriptors / Indicators: Discomfort;Jabbing;Sore Pain Intervention(s): Monitored during session;Repositioned     Hand Dominance Right   Extremity/Trunk Assessment Upper Extremity Assessment Upper Extremity Assessment: Generalized weakness   Lower Extremity Assessment  Lower Extremity Assessment:  Generalized weakness   Cervical / Trunk Assessment Cervical / Trunk Assessment: Kyphotic   Communication Communication Communication: Expressive difficulties(pt increased time to speak and nearly mute through convo)   Cognition Arousal/Alertness: Awake/alert Behavior During Therapy: WFL for tasks assessed/performed Overall Cognitive Status: Impaired/Different from baseline Area of Impairment: Orientation;Memory;Safety/judgement;Awareness;Problem solving                 Orientation Level: Disoriented to;Time;Place;Situation   Memory: Decreased short-term memory;Decreased recall of precautions   Safety/Judgement: Decreased awareness of safety;Decreased awareness of deficits Awareness: Intellectual Problem Solving: Slow processing;Decreased initiation;Requires verbal cues;Requires tactile cues General Comments: Pt Ox1 to self; pt unable to recall any precautions and verbalizing very little when directed a question. Pt's son was able to get pt to verbalize more.   General Comments  VSS on RA. HR <90 BPM with exertion.    Exercises     Shoulder Instructions      Home Living Family/patient expects to be discharged to:: Private residence Living Arrangements: Children Available Help at Discharge: Family;Available 24 hours/day Type of Home: Apartment Home Access: Stairs to enter Entrance Stairs-Number of Steps: 4 Entrance Stairs-Rails: Right Home Layout: One level     Bathroom Shower/Tub: Chief Strategy Officer: Standard     Home Equipment: Tub bench          Prior Functioning/Environment Level of Independence: Independent        Comments: Lives with son, but pt was totally independent with ADL, mobility and cognition        OT Problem List: Decreased strength;Decreased activity tolerance;Impaired balance (sitting and/or standing);Decreased cognition;Decreased safety awareness;Pain;Increased edema;Impaired UE functional use;Decreased knowledge of  use of DME or AE      OT Treatment/Interventions: Self-care/ADL training;Therapeutic exercise;Energy conservation;DME and/or AE instruction;Therapeutic activities;Cognitive remediation/compensation;Patient/family education;Balance training    OT Goals(Current goals can be found in the care plan section) Acute Rehab OT Goals Patient Stated Goal: to go home OT Goal Formulation: With patient/family Time For Goal Achievement: 02/27/20 Potential to Achieve Goals: Good ADL Goals Pt Will Perform Grooming: with supervision;standing Pt Will Perform Lower Body Dressing: with supervision;sitting/lateral leans;sit to/from stand Additional ADL Goal #1: Pt will recall sternal precautions with 0 cues. Additional ADL Goal #2: Pt will demonstrate ability to follow (2) multi-step commands with minimal cues for attention to task.  OT Frequency: Min 2X/week   Barriers to D/C:            Co-evaluation              AM-PAC OT "6 Clicks" Daily Activity     Outcome Measure Help from another person eating meals?: None Help from another person taking care of personal grooming?: A Little Help from another person toileting, which includes using toliet, bedpan, or urinal?: A Lot Help from another person bathing (including washing, rinsing, drying)?: A Lot Help from another person to put on and taking off regular upper body clothing?: A Little Help from another person to put on and taking off regular lower body clothing?: A Lot 6 Click Score: 16   End of Session Equipment Utilized During Treatment: Rolling walker Nurse Communication: Mobility status  Activity Tolerance: Patient tolerated treatment well Patient left: in bed;with call bell/phone within reach;with family/visitor present  OT Visit Diagnosis: Unsteadiness on feet (R26.81);Muscle weakness (generalized) (M62.81);Other symptoms and signs involving cognitive function                Time: 1438-1500 OT Time Calculation (min): 22  min Charges:   OT General Charges $OT Visit: 1 Visit OT Evaluation $OT Eval Moderate Complexity: 1 Mod  Flora Lipps, OTR/L Acute Rehabilitation Services Pager: [phone] Office: [phone]   Susane Bey C 02/13/2020, 4:24 PM

## 2020-02-13 NOTE — TOC Initial Note (Signed)
Transition of Care [facility]) - Initial/Assessment Note    Patient Details  Name: Bianca [name] MRN: [id number] Date of Birth: Feb 25, 1946  [facility] CM/SW Contact:    Vinie Sill, [place] Phone Number: 02/13/2020, 12:56 PM  Clinical Narrative:                  CSW spoke with patient's daughter,Joletta. CSW introduced self and explained role. CSW discussed PT recommendation of ST rehab at [facility] before discharging home. Family declined SNF.  CSW met with the patient's daughter,Joletta and her son,Timothy  to provide them with the medicare.gov HH choice list. Family confirmed plan of patient going home with HH. Patient's son , shared he and his sister has taken off work to be home with the patient 24/7. Family will review the medicare.gov list for [facility] choice. Family states no questions or concerns at this time.  RNCM updated.  Thurmond Butts, MSW, [place] Clinical Social Worker   Expected Discharge Plan: Home w Home Health Services Barriers to Discharge: No Barriers Identified   Patient Goals and CMS Choice     Choice offered to / list presented to : Adult Children  Expected Discharge Plan and Services Expected Discharge Plan: [place] In-house Referral: Clinical Social Work     Living arrangements for the past 2 months: Single Family Home                                      Prior Living Arrangements/Services Living arrangements for the past 2 months: Single Family Home Lives with:: Self Patient language and need for interpreter reviewed:: No        Need for Family Participation in Patient Care: Yes (Comment) Care giver support system in place?: Yes (comment)   Criminal Activity/Legal Involvement Pertinent to Current Situation/Hospitalization: No - Comment as needed  Activities of Daily Living Home Assistive Devices/Equipment: Eyeglasses ADL Screening (condition at time of admission) Patient's cognitive ability adequate to  safely complete daily activities?: Yes Is the patient deaf or have difficulty hearing?: Yes Does the patient have difficulty seeing, even when wearing glasses/contacts?: No Does the patient have difficulty concentrating, remembering, or making decisions?: No Patient able to express need for assistance with ADLs?: Yes Does the patient have difficulty dressing or bathing?: No Independently performs ADLs?: Yes (appropriate for developmental age) Does the patient have difficulty walking or climbing stairs?: Yes Weakness of Legs: Both Weakness of Arms/Hands: None  Permission Sought/Granted Permission sought to share information with : Family Supports Permission granted to share information with : Yes, Verbal Permission Granted  Share Information with NAME: Haadiya Frogge  Permission granted to share info w AGENCY: [place] granted to share info w Relationship: daughter  Permission granted to share info w Contact Information: [phone]  Emotional Assessment Appearance:: Appears stated age Attitude/Demeanor/Rapport: Unable to Assess(patient sleep) Affect (typically observed): Unable to Assess Orientation: : Oriented to Self Alcohol / Substance Use: Not Applicable Psych Involvement: No (comment)  Admission diagnosis:  Unstable angina ([place]) [I20.0] Hypertensive urgency [I16.0] Chest pain [R07.9] Substernal chest pain relieved by nitroglycerin [R07.2] Coronary artery disease [I25.10] Patient Active Problem List   Diagnosis Date Noted  . S/P CABG x 4 02/08/2020  . Coronary artery disease 02/08/2020  . Chest pain 02/03/2020  . Unstable angina ([place]) 02/03/2020  . Hypokalemia 02/03/2020  . CAD (coronary artery disease) 01/31/2020  . Pain  due to onychomycosis of toenails of both feet 01/30/2020  . Chest pain of uncertain etiology 22/29/7989  . Non-STEMI (non-ST elevated myocardial infarction) ([place]) 01/29/2020  . Essential hypertension 01/29/2020  . Hyperlipidemia 01/29/2020  .  Acute diastolic CHF (congestive heart failure) ([place]) 01/06/2020  . CHF (congestive heart failure) ([place]) 01/06/2020  . Noncompliance with medication regimen 08/17/2018  . Noncompliance with diagnostic testing 08/17/2018  . Type 2 diabetes mellitus ([place]) 04/17/2008  . TOBACCO ABUSE 04/17/2008   PCP:  Julian Hy, PA-C Pharmacy:   [facility] [address] [place] [address] Phone: [phone] Fax: [phone]  [facility] [place], [place] [address] [place] [address] Phone: [phone] Fax: [phone]     Social Determinants of Health (SDOH) Interventions    Readmission Risk Interventions No flowsheet data found.

## 2020-02-13 NOTE — Progress Notes (Addendum)
[address]       [place] [address]             [phone]      5 Days Post-Op Procedure(s) (LRB): CORONARY ARTERY BYPASS GRAFTING (CABG) TIMES FOUR USING LEFT INTERNAL MAMMARY ARTERY AND RIGHT SAPHENOUS LEG VEIN HARVESTED ENDOSCOPICALLY (N/A) TRANSESOPHAGEAL ECHOCARDIOGRAM (TEE) (N/A) Subjective: Confused, oriented x 0 but re-orients pretty easy with prompts  Objective: Vital signs in last 24 hours: Temp:  [97.6 F (36.4 C)-99.1 F (37.3 C)] 97.7 F (36.5 C) (05/05 0426) Pulse Rate:  [65-76] 70 (05/05 0426) Cardiac Rhythm: Heart block;Bundle branch block (05/04 2002) Resp:  [14-25] 16 (05/05 0426) BP: (105-148)/(58-109) 147/64 (05/05 0400) SpO2:  [97 %-100 %] 97 % (05/05 0426) Weight:  [77.6 kg] 77.6 kg (05/05 0600)  Hemodynamic parameters for last 24 hours:    Intake/Output from previous day: 05/04 0701 - 05/05 0700 In: 261.4 [P.O.:240; I.V.:21.4] Out: -  Intake/Output this shift: No intake/output data recorded.  General appearance: alert, cooperative and no distress Heart: regular rate and rhythm Lungs: dim in lower fields Abdomen: benign Extremities: minor edema Wound: incis healing well  Lab Results: Recent Labs    02/11/20 0420 02/12/20 0449  WBC 10.1 9.3  HGB 7.2* 7.7*  HCT 23.9* 24.8*  PLT 80* 112*   BMET:  Recent Labs    02/12/20 0449 02/13/20 0341  NA 140 142  K 3.7 3.6  CL 109 112*  CO2 21* 23  GLUCOSE 135* 126*  BUN 50* 45*  CREATININE 1.63* 1.55*  CALCIUM 7.9* 8.0*    PT/INR: No results for input(s): LABPROT, INR in the last 72 hours. ABG    Component Value Date/Time   PHART 7.342 (L) 02/08/2020 2029   HCO3 21.7 02/08/2020 2029   TCO2 23 02/08/2020 2029   ACIDBASEDEF 4.0 (H) 02/08/2020 2029   O2SAT 99.0 02/08/2020 2029   CBG (last 3)  Recent Labs    02/12/20 1517 02/12/20 2218 02/13/20 0615  GLUCAP 135* 139* 124*    Meds Scheduled Meds: . acetaminophen  1,000 mg Oral Q6H   Or  .  acetaminophen (TYLENOL) oral liquid 160 mg/5 mL  1,000 mg Per Tube Q6H  . aspirin EC  325 mg Oral Daily   Or  . aspirin  324 mg Per Tube Daily  . atorvastatin  80 mg Oral Daily  . bisacodyl  10 mg Oral Daily   Or  . bisacodyl  10 mg Rectal Daily  . Chlorhexidine Gluconate Cloth  6 each Topical Once  . docusate sodium  200 mg Oral Daily  . furosemide  40 mg Oral Daily  . insulin aspart  0-24 Units Subcutaneous TID AC & HS  . mouth rinse  15 mL Mouth Rinse BID  . metoprolol tartrate  12.5 mg Oral BID   Or  . metoprolol tartrate  12.5 mg Per Tube BID  . midodrine  10 mg Oral TID WC  . pantoprazole  40 mg Oral Daily   Continuous Infusions: . lactated ringers     PRN Meds:.dextrose, lactated ringers, metoprolol tartrate, morphine injection, ondansetron (ZOFRAN) IV, oxyCODONE, sodium chloride flush  Xrays DG Chest Port 1 View  Result Date: 02/12/2020 CLINICAL DATA:  Sore chest. EXAM: PORTABLE CHEST 1 VIEW COMPARISON:  02/10/2020. FINDINGS: Right IJ line noted stable position. Interim removal of mediastinal drainage catheter left chest tube. Prior CABG. Cardiomegaly. No pulmonary venous congestion. Low lung volumes with bibasilar atelectasis. Mild bibasilar infiltrates  cannot be excluded. Small left pleural effusion cannot be excluded. No right pleural effusion noted on today's exam. No pneumothorax. IMPRESSION: 1. Right IJ line in stable position. Interim removal of mediastinal drainage catheter and left chest tube. No pneumothorax. 2. Prior CABG. Stable cardiomegaly. No pulmonary venous congestion. Three low lung volumes with bibasilar atelectasis/infiltrates again noted. Small left pleural effusion cannot be excluded. No right pleural effusion noted on today's exam. Electronically Signed   By: Maisie Fus  Register   On: 02/12/2020 08:53    Assessment/Plan: S/P Procedure(s) (LRB): CORONARY ARTERY BYPASS GRAFTING (CABG) TIMES FOUR USING LEFT INTERNAL MAMMARY ARTERY AND RIGHT SAPHENOUS LEG VEIN  HARVESTED ENDOSCOPICALLY (N/A) TRANSESOPHAGEAL ECHOCARDIOGRAM (TEE) (N/A)  1 stable hemodynamics, with some HTN, I think we can stop midodrine and see how she does. 2 sinus rhythm 3 sats good on RA 4 renal fxn conts to trend better, Peak BUN/Creat50/1.80>>>now 45/1.55- UOP not accurate, weight conts to trend down and edema improving- cont to diurese 5 confusion- mostly sundowning , re-orients with prompting.  6 slow progress with progression, PT assisting and recs-SNF- will need to d/w family. Will get OT and SW consults 7 push pulm toilet/IS- educated on IS use as she couldn't remember how to use     7:20 AM      LOS: 10 days    Rowe Clack PA-C Pager [phone] 02/13/2020   Doing well Some confusion Needs to work with PT dispo planning  Corliss Skains

## 2020-02-14 LAB — BASIC METABOLIC PANEL
Anion gap: 7 (ref 5–15)
BUN: 32 mg/dL — ABNORMAL HIGH (ref 8–23)
CO2: 24 mmol/L (ref 22–32)
Calcium: 7.8 mg/dL — ABNORMAL LOW (ref 8.9–10.3)
Chloride: 109 mmol/L (ref 98–111)
Creatinine, Ser: 1.28 mg/dL — ABNORMAL HIGH (ref 0.44–1.00)
GFR calc Af Amer: 48 mL/min — ABNORMAL LOW (ref >60)
GFR calc non Af Amer: 41 mL/min — ABNORMAL LOW (ref >60)
Glucose, Bld: 104 mg/dL — ABNORMAL HIGH (ref 70–99)
Potassium: 3.3 mmol/L — ABNORMAL LOW (ref 3.5–5.1)
Sodium: 140 mmol/L (ref 135–145)

## 2020-02-14 LAB — GLUCOSE, CAPILLARY
Glucose-Capillary: 107 mg/dL — ABNORMAL HIGH (ref 70–99)
Glucose-Capillary: 155 mg/dL — ABNORMAL HIGH (ref 70–99)
Glucose-Capillary: 124 mg/dL — ABNORMAL HIGH (ref 70–99)
Glucose-Capillary: 212 mg/dL — ABNORMAL HIGH (ref 70–99)

## 2020-02-14 MED ORDER — POTASSIUM CHLORIDE CRYS ER 20 MEQ PO TBCR
40.0000 meq | EXTENDED_RELEASE_TABLET | Freq: Every day | ORAL | Status: DC
  Administered 2020-02-14 – 2020-02-15 (×2): 40 meq via ORAL
  Filled 2020-02-14 (×2): qty 2

## 2020-02-14 MED ORDER — ENOXAPARIN SODIUM 30 MG/0.3ML ~~LOC~~ SOLN
30.0000 mg | Freq: Every day | SUBCUTANEOUS | Status: DC
  Administered 2020-02-14 – 2020-02-15 (×2): 30 mg via SUBCUTANEOUS
  Filled 2020-02-14 (×2): qty 0.3

## 2020-02-14 MED ORDER — PHENYLEPHRINE HCL-NACL 10-0.9 MG/250ML-% IV SOLN
INTRAVENOUS | Status: AC
  Filled 2020-02-14: qty 500

## 2020-02-14 MED ORDER — AMLODIPINE BESYLATE 5 MG PO TABS
5.0000 mg | ORAL_TABLET | Freq: Every day | ORAL | Status: DC
  Administered 2020-02-14 – 2020-02-15 (×2): 5 mg via ORAL
  Filled 2020-02-14 (×2): qty 1

## 2020-02-14 MED ORDER — METOPROLOL TARTRATE 25 MG PO TABS
25.0000 mg | ORAL_TABLET | Freq: Two times a day (BID) | ORAL | Status: DC
  Administered 2020-02-14 – 2020-02-15 (×3): 25 mg via ORAL
  Filled 2020-02-14 (×3): qty 1

## 2020-02-14 NOTE — Progress Notes (Signed)
Physical Therapy Treatment Patient Details Name: Bianca [name] MRN: [id number] DOB: 10/09/1946 Today's Date: 02/14/2020    History of Present Illness 74 yo admitted with chest pain and [place]. Pt with MI in 12/2019 who refused CABG and had cath 01/31/20. Pt now s/p CABG x 4 4/30. PMhx: HTN, HLD, DM, CAD, tobacco use    PT Comments    Patient progressing this session able to transfer and ambulation with +1 A.  Fatigues with ambulation turning prior to goal, but VSS with ambulation.  She has son who stays with her.  Family will assist at d/c.  Recommend follow up HHPT at d/c.    Follow Up Recommendations  Home health PT;Supervision/Assistance - 24 hour     Equipment Recommendations  Rolling walker with 5" wheels;3in1 (PT)    Recommendations for Other Services       Precautions / Restrictions Precautions Precautions: Fall;Sternal Precaution Comments: needs frequent reminders for sternal precautions Restrictions Weight Bearing Restrictions: Yes RUE Weight Bearing: Partial weight bearing RUE Partial Weight Bearing Percentage or Pounds: Sternal Precautions LUE Weight Bearing: Partial weight bearing LUE Partial Weight Bearing Percentage or Pounds: Sternal Precautions    Mobility  Bed Mobility               General bed mobility comments: up on BSC upon entry  Transfers Overall transfer level: Needs assistance Equipment used: Rolling walker (2 wheeled) Transfers: Sit to/from Stand Sit to Stand: Min assist Stand pivot transfers: Min assist       General transfer comment: assist and cues to follow precautions  Ambulation/Gait Ambulation/Gait assistance: Min assist Gait Distance (Feet): 110 Feet Assistive device: Rolling walker (2 wheeled) Gait Pattern/deviations: Step-through pattern;Decreased stride length;Shuffle     General Gait Details: assist for walker safety, cues for posture and distance due to [employer] Mobility     Modified Rankin (Stroke Patients Only)       Balance Overall balance assessment: Needs assistance   Sitting balance-Leahy Scale: Fair     Standing balance support: Bilateral upper extremity supported Standing balance-Leahy Scale: Poor Standing balance comment: UE support for balance, assist for hygiene in standing while toileting                            Cognition Arousal/Alertness: Awake/alert Behavior During Therapy: WFL for tasks assessed/performed Overall Cognitive Status: Impaired/Different from baseline Area of Impairment: Memory;Following commands;Safety/judgement                     Memory: Decreased recall of precautions;Decreased short-term memory Following Commands: Follows one step commands consistently;Follows one step commands with increased time Safety/Judgement: Decreased awareness of safety;Decreased awareness of deficits   Problem Solving: Slow processing        Exercises      General Comments General comments (skin integrity, edema, etc.): VSS on RA      Pertinent Vitals/Pain      Home Living                      Prior Function            PT Goals (current goals can now be found in the care plan section) Progress towards PT goals: Progressing toward goals    Frequency    Min 3X/week      PT Plan Discharge plan needs to be  updated    Co-evaluation              AM-PAC PT "6 Clicks" Mobility   Outcome Measure  Help needed turning from your back to your side while in a flat bed without using bedrails?: A Little Help needed moving from lying on your back to sitting on the side of a flat bed without using bedrails?: A Lot Help needed moving to and from a bed to a chair (including a wheelchair)?: A Little Help needed standing up from a chair using your arms (e.g., wheelchair or bedside chair)?: A Little Help needed to walk in hospital room?: A Little Help needed climbing 3-5 steps with a railing?  : A Lot 6 Click Score: 16    End of Session   Activity Tolerance: Patient limited by fatigue Patient left: in chair;with call bell/phone within reach   PT Visit Diagnosis: Other abnormalities of gait and mobility (R26.89);Difficulty in walking, not elsewhere classified (R26.2);Muscle weakness (generalized) (M62.81)     Time: 8502-7741 PT Time Calculation (min) (ACUTE ONLY): 20 min  Charges:  $Gait Training: 8-22 mins                     Magda Kiel, [place] [phone] 02/14/2020    Reginia Naas 02/14/2020, 10:57 AM

## 2020-02-14 NOTE — Progress Notes (Addendum)
[address]       [place] [address]             [phone]      6 Days Post-Op Procedure(s) (LRB): CORONARY ARTERY BYPASS GRAFTING (CABG) TIMES FOUR USING LEFT INTERNAL MAMMARY ARTERY AND RIGHT SAPHENOUS LEG VEIN HARVESTED ENDOSCOPICALLY (N/A) TRANSESOPHAGEAL ECHOCARDIOGRAM (TEE) (N/A) Subjective: Confusion much improved this am, oriented x3 , much more alert and active  Objective: Vital signs in last 24 hours: Temp:  [97.9 F (36.6 C)-98.6 F (37 C)] 97.9 F (36.6 C) (05/06 0432) Pulse Rate:  [71-76] 76 (05/06 0432) Cardiac Rhythm: Heart block;Bundle branch block (05/05 2036) Resp:  [15-19] 15 (05/06 0432) BP: (117-152)/(62-73) 139/73 (05/06 0432) SpO2:  [97 %-100 %] 97 % (05/06 0432) Weight:  [77.5 kg] 77.5 kg (05/06 0432)  Hemodynamic parameters for last 24 hours:    Intake/Output from previous day: No intake/output data recorded. Intake/Output this shift: No intake/output data recorded.  General appearance: alert, cooperative and no distress Heart: regular rate and rhythm Lungs: mildly dim in bases Abdomen: benign Extremities: no edema Wound: incis healing well  Lab Results: Recent Labs    02/12/20 0449  WBC 9.3  HGB 7.7*  HCT 24.8*  PLT 112*   BMET:  Recent Labs    02/13/20 0341 02/14/20 0231  NA 142 140  K 3.6 3.3*  CL 112* 109  CO2 23 24  GLUCOSE 126* 104*  BUN 45* 32*  CREATININE 1.55* 1.28*  CALCIUM 8.0* 7.8*    PT/INR: No results for input(s): LABPROT, INR in the last 72 hours. ABG    Component Value Date/Time   PHART 7.342 (L) 02/08/2020 2029   HCO3 21.7 02/08/2020 2029   TCO2 23 02/08/2020 2029   ACIDBASEDEF 4.0 (H) 02/08/2020 2029   O2SAT 99.0 02/08/2020 2029   CBG (last 3)  Recent Labs    02/13/20 1610 02/13/20 2313 02/14/20 0613  GLUCAP 107* 149* 107*    Meds Scheduled Meds: . aspirin EC  325 mg Oral Daily   Or  . aspirin  324 mg Per Tube Daily  . atorvastatin  80 mg Oral Daily  .  bisacodyl  10 mg Oral Daily   Or  . bisacodyl  10 mg Rectal Daily  . Chlorhexidine Gluconate Cloth  6 each Topical Once  . docusate sodium  200 mg Oral Daily  . enoxaparin (LOVENOX) injection  30 mg Subcutaneous Q24H  . furosemide  40 mg Oral Daily  . insulin aspart  0-24 Units Subcutaneous TID AC & HS  . mouth rinse  15 mL Mouth Rinse BID  . metoprolol tartrate  12.5 mg Oral BID   Or  . metoprolol tartrate  12.5 mg Per Tube BID  . pantoprazole  40 mg Oral Daily  . potassium chloride  10 mEq Oral Daily   Continuous Infusions: . lactated ringers     PRN Meds:.dextrose, lactated ringers, metoprolol tartrate, ondansetron (ZOFRAN) IV, oxyCODONE, sodium chloride flush  Xrays No results found.  Assessment/Plan: S/P Procedure(s) (LRB): CORONARY ARTERY BYPASS GRAFTING (CABG) TIMES FOUR USING LEFT INTERNAL MAMMARY ARTERY AND RIGHT SAPHENOUS LEG VEIN HARVESTED ENDOSCOPICALLY (N/A) TRANSESOPHAGEAL ECHOCARDIOGRAM (TEE) (N/A)  1 conts to improve 2 VSS, some HTN, will resume norvasc, hold on ARB resume for now with recent bump in creat- improved to 1.28 today from peak of 1.80 3 sats good on RA 4 hypokalemia- replace 5 BS well controlled for hospitalized patient 6 will arrange for HHPT,  HHOT- family does not want SNF 7 improving cardiac rehab- requiring 2 person assist for short distancewith walker 8 disp- poss home 1-2 days  LOS: 11 days    Rowe Clack PA-C Pager [phone] 02/14/2020   Agree with above Continue PT dispo planning  Amiri Tritch O [place] Harder

## 2020-02-14 NOTE — TOC Progression Note (Signed)
Transition of Care (TOC) - Progression Note  Donn Pierini RN, BSN Transitions of Care Unit 4E- RN Case Manager [phone]   Patient Details  Name: Bianca [name] MRN: [id number] Date of Birth: [birth year]  Transition of Care [facility]) CM/SW Contact  Zenda Alpers Lenn Sink, RN Phone Number: 02/14/2020, 2:28 PM  Clinical Narrative:    Follow up done with patient's son and daughter regarding transition plan and HH choice- son at the bedside and call made per son to his sister Bianca [name] while CM was at the bedside to speak with via speaker phone. Per Bianca [name] she has selected HH agencies off the provided list as her selected agencies as [facility], [facility], or [facility]. Pt will need a 3n1 and RW also for home. HH and DME orders have been placed. Address, phone # and PCP all confirmed in epic- Daughter Bianca [name] will be first contact for [facility]- [phone].  Call made to [facility] with Bianca [name]- who has accepted referral for HHPT/OT needs.  Will call Bianca [name] with [employer] to have DME delivered to room prior to discharge.    Expected Discharge Plan: Home w Home Health Services Barriers to Discharge: No Barriers Identified  Expected Discharge Plan and Services Expected Discharge Plan: Home w Home Health Services In-house Referral: Clinical Social Work Discharge Planning Services: CM Consult Post Acute Care Choice: Durable Medical Equipment, Home Health Living arrangements for the past 2 months: Single Family Home                 DME Arranged: 3-N-1, Walker rolling DME Agency: [facility]       HH Arranged: PT, OT HH Agency: [name] Date HH Agency Contacted: 02/14/20 Time HH Agency Contacted: 1220 Representative spoke with at [facility] Agency: Bianca [name]   Social Determinants of Health (SDOH) Interventions    Readmission Risk Interventions Readmission Risk Prevention Plan 02/14/2020  HRI or Home Care Consult Complete  Social Work Consult for Recovery Care Planning/Counseling Complete  Palliative  Care Screening Not Applicable  Some recent data might be hidden

## 2020-02-14 NOTE — Progress Notes (Signed)
   02/14/20 1327  Therapy Vitals  Pulse Rate 82  BP (!) 148/76  Patient Position (if appropriate) Sitting  Oxygen Therapy  SpO2 97 %  O2 Device Room Air  Mobility  Activity Ambulated in hall  Level of Assistance Minimal assist, patient does 75% or more  Assistive Device Front wheel walker  Distance Ambulated (ft) 254 ft  Mobility Response Tolerated well  Mobility performed by Mobility specialist  Bed Position Chair  $Mobility charge 1 Mobility    Pt tolerated walking with RW well. Needed assist to get up from bed and to sit down. She needed a standing rest break once while in the hallway due to SOB. Denied dizziness/lightheadness the whole time. Required frequent cues for sternal precautions, poor carryover.   Mamie Levers Mobility Specialist

## 2020-02-15 ENCOUNTER — Other Ambulatory Visit: Payer: Self-pay | Admitting: Surgical

## 2020-02-15 LAB — BASIC METABOLIC PANEL
Anion gap: 7 (ref 5–15)
BUN: 17 mg/dL (ref 8–23)
CO2: 27 mmol/L (ref 22–32)
Calcium: 7.9 mg/dL — ABNORMAL LOW (ref 8.9–10.3)
Chloride: 108 mmol/L (ref 98–111)
Creatinine, Ser: 0.95 mg/dL (ref 0.44–1.00)
GFR calc Af Amer: >60 mL/min (ref >60)
GFR calc non Af Amer: 59 mL/min — ABNORMAL LOW (ref >60)
Glucose, Bld: 95 mg/dL (ref 70–99)
Potassium: 3.4 mmol/L — ABNORMAL LOW (ref 3.5–5.1)
Sodium: 142 mmol/L (ref 135–145)

## 2020-02-15 LAB — GLUCOSE, CAPILLARY
Glucose-Capillary: 98 mg/dL (ref 70–99)
Glucose-Capillary: 136 mg/dL — ABNORMAL HIGH (ref 70–99)

## 2020-02-15 MED ORDER — ASPIRIN 325 MG PO TBEC: 325.0000 mg | DELAYED_RELEASE_TABLET | Freq: Every day | ORAL | Status: DC

## 2020-02-15 MED ORDER — METFORMIN HCL 500 MG PO TABS
1000.0000 mg | ORAL_TABLET | Freq: Two times a day (BID) | ORAL | Status: DC
  Administered 2020-02-15: 1000 mg via ORAL
  Filled 2020-02-15: qty 2

## 2020-02-15 MED ORDER — LOSARTAN POTASSIUM 50 MG PO TABS
50.0000 mg | ORAL_TABLET | Freq: Every day | ORAL | Status: DC
  Administered 2020-02-15: 50 mg via ORAL
  Filled 2020-02-15: qty 1

## 2020-02-15 MED ORDER — POTASSIUM CHLORIDE CRYS ER 20 MEQ PO TBCR: 40.0000 meq | EXTENDED_RELEASE_TABLET | Freq: Every day | ORAL | 0 refills | Status: DC

## 2020-02-15 MED ORDER — LOSARTAN POTASSIUM 50 MG PO TABS: 50.0000 mg | ORAL_TABLET | Freq: Every day | ORAL | 1 refills | Status: DC

## 2020-02-15 MED ORDER — FUROSEMIDE 40 MG PO TABS: 40.0000 mg | ORAL_TABLET | Freq: Every day | ORAL | 0 refills | Status: DC

## 2020-02-15 NOTE — Consult Note (Signed)
   [facility] Inpatient Consult   02/15/2020  ELLYSA PARRACK 10-08-1946 [id number]  Chart reviewed for post hospital transition of care and found that the patient's primary care provider is with [facility] which is 'not' a [facility], art.   Patient needs will be met with home health care and General EMMI discharge at this time.   Of note, [facility] Care Management services does not replace or interfere with any services that are arranged by inpatient Transition of Care [TOC] team   For additional questions or referrals please contact:    For questions, please call:  Natividad Brood, RN BSN [facility] Liaison  [phone] business mobile phone Toll free office [phone]  Fax number: [phone] [email or web address]

## 2020-02-15 NOTE — TOC Transition Note (Addendum)
Transition of Care [facility]) - CM/SW Discharge Note Donn Pierini RN, BSN Transitions of Care Unit 4E- RN Case Manager [phone]   Patient Details  Name: Bianca [name] MRN: [id number] Date of Birth: August 05, 1946  Transition of Care [facility]) CM/SW Contact:  Darrold Span, RN Phone Number: 02/15/2020, 10:15 AM   Clinical Narrative:    Pt stable for transition home today, HH has been set up with Delila Spence notified of discharge today. DME has been delivered to the room by [employer]. Son at the bedside informed that Frances Furbish had accepted [facility] referral and would be calling to set up start of care. Son requested MD office phone # for [facility] paperwork- Dr. Irving Copas office # provided.  1130- daughter requested to speak with CM when she arrived- went back to room to speak with daughter and answer questions regarding HH arrangements- all questions answered and son/daughter voiced no further questions or needs from CM.   Final next level of care: Home w Home Health Services Barriers to Discharge: No Barriers Identified   Patient Goals and CMS Choice Patient states their goals for this hospitalization and ongoing recovery are:: return home CMS Medicare.gov Compare Post Acute Care list provided to:: Patient Choice offered to / list presented to : Adult Children  Discharge Placement               Home with [facility]        Discharge Plan and Services In-house Referral: Clinical Social Work Discharge Planning Services: CM Consult Post Acute Care Choice: Durable Medical Equipment, Home Health          DME Arranged: 3-N-1, Walker rolling DME Agency: [employer] Date DME Agency Contacted: 02/14/20 Time DME Agency Contacted: 1600 Representative spoke with at DME Agency: Ian Malkin HH Arranged: PT, OT HH Agency: [facility] Date [facility] Agency Contacted: 02/14/20 Time HH Agency Contacted: 1220 Representative spoke with at [facility] Agency: Kandee Keen  Social Determinants of Health (SDOH) Interventions      Readmission Risk Interventions Readmission Risk Prevention Plan 02/15/2020 02/14/2020  Transportation Screening Complete -  PCP or Specialist Appt within 5-7 Days Complete -  Home Care Screening Complete -  Medication Review (RN CM) Complete -  HRI or Home Care Consult - Complete  Social Work Consult for Recovery Care Planning/Counseling - Complete  Palliative Care Screening - Not Applicable  Some recent data might be hidden

## 2020-02-15 NOTE — Progress Notes (Addendum)
[address]       [place] [address]             [phone]      7 Days Post-Op Procedure(s) (LRB): CORONARY ARTERY BYPASS GRAFTING (CABG) TIMES FOUR USING LEFT INTERNAL MAMMARY ARTERY AND RIGHT SAPHENOUS LEG VEIN HARVESTED ENDOSCOPICALLY (N/A) TRANSESOPHAGEAL ECHOCARDIOGRAM (TEE) (N/A) Subjective: conts to make good progress  Objective: Vital signs in last 24 hours: Temp:  [97.4 F (36.3 C)-98.3 F (36.8 C)] 98.3 F (36.8 C) (05/07 0335) Pulse Rate:  [75-87] 87 (05/07 0335) Cardiac Rhythm: Normal sinus rhythm (05/07 0335) Resp:  [16-20] 20 (05/07 0335) BP: (134-174)/(67-82) 144/75 (05/07 0335) SpO2:  [97 %-100 %] 97 % (05/07 0335) Weight:  [74.5 kg] 74.5 kg (05/07 0335)  Hemodynamic parameters for last 24 hours:    Intake/Output from previous day: 05/06 0701 - 05/07 0700 In: 120 [P.O.:120] Out: 1100 [Urine:1100] Intake/Output this shift: No intake/output data recorded.  General appearance: alert, cooperative and no distress Heart: regular rate and rhythm Lungs: dim in bases Abdomen: benign Extremities: reace edema Wound: incis healing well  Lab Results: No results for input(s): WBC, HGB, HCT, PLT in the last 72 hours. BMET:  Recent Labs    02/14/20 0231 02/15/20 0323  NA 140 142  K 3.3* 3.4*  CL 109 108  CO2 24 27  GLUCOSE 104* 95  BUN 32* 17  CREATININE 1.28* 0.95  CALCIUM 7.8* 7.9*    PT/INR: No results for input(s): LABPROT, INR in the last 72 hours. ABG    Component Value Date/Time   PHART 7.342 (L) 02/08/2020 2029   HCO3 21.7 02/08/2020 2029   TCO2 23 02/08/2020 2029   ACIDBASEDEF 4.0 (H) 02/08/2020 2029   O2SAT 99.0 02/08/2020 2029   CBG (last 3)  Recent Labs    02/14/20 1636 02/14/20 2106 02/15/20 0610  GLUCAP 124* 212* 98    Meds Scheduled Meds: . amLODipine  5 mg Oral Daily  . aspirin EC  325 mg Oral Daily   Or  . aspirin  324 mg Per Tube Daily  . atorvastatin  80 mg Oral Daily  . bisacodyl  10 mg  Oral Daily   Or  . bisacodyl  10 mg Rectal Daily  . Chlorhexidine Gluconate Cloth  6 each Topical Once  . docusate sodium  200 mg Oral Daily  . enoxaparin (LOVENOX) injection  30 mg Subcutaneous Daily  . furosemide  40 mg Oral Daily  . insulin aspart  0-24 Units Subcutaneous TID AC & HS  . mouth rinse  15 mL Mouth Rinse BID  . metoprolol tartrate  25 mg Oral BID  . pantoprazole  40 mg Oral Daily  . potassium chloride  40 mEq Oral Daily   Continuous Infusions: . lactated ringers     PRN Meds:.dextrose, lactated ringers, metoprolol tartrate, ondansetron (ZOFRAN) IV, oxyCODONE, sodium chloride flush  Xrays No results found.  Assessment/Plan: S/P Procedure(s) (LRB): CORONARY ARTERY BYPASS GRAFTING (CABG) TIMES FOUR USING LEFT INTERNAL MAMMARY ARTERY AND RIGHT SAPHENOUS LEG VEIN HARVESTED ENDOSCOPICALLY (N/A) TRANSESOPHAGEAL ECHOCARDIOGRAM (TEE) (N/A)  1 steady progress in overall recovery, conts to make slow/steady progress in physical recovery 2 HTN at times- with creat now in normal range can restart ARB at lower dose initially  3 replace K+ , cont diuresis for short term, will not need much longer 4 BS fair control, will reinitiate glucophage 5 cont PT/rehab modalities- HH has been arranged to cont 6 poss home  today v tomorrow- will d/w MD    LOS: 12 days    Rowe Clack PA-C Pager [phone] 02/15/2020   Agree with above Continue PT HH arranged Ok for discharge  Corliss Skains

## 2020-02-15 NOTE — Progress Notes (Signed)
Physical Therapy Treatment Patient Details Name: Bianca [name] MRN: [id number] DOB: 1946-01-05 Today's Date: 02/15/2020    History of Present Illness 74 yo admitted with chest pain and [place]. Pt with MI in 12/2019 who refused CABG and had cath 01/31/20. Pt now s/p CABG x 4 4/30. PMhx: HTN, HLD, DM, CAD, tobacco use    PT Comments    Patient progressing slowly towards PT goals. Improved ambulation distance today but continues to require standing rest breaks due to SOB and fatigue. VSS on RA. Pt with poor memory/recall of sternal precautions and needs cues to adhere to them during mobility. Pt with cognitive deficits relating to safety and awareness as well. Able to state the date today. Recommend 24/7 supervision for mobility. Will follow.    Follow Up Recommendations  Home health PT;Supervision/Assistance - 24 hour     Equipment Recommendations  Rolling walker with 5" wheels;3in1 (PT)    Recommendations for Other Services       Precautions / Restrictions Precautions Precautions: Fall;Sternal Precaution Booklet Issued: No Precaution Comments: needs frequent reminders for sternal precautions Restrictions Weight Bearing Restrictions: Yes RUE Weight Bearing: Partial weight bearing RUE Partial Weight Bearing Percentage or Pounds: Sternal Precautions LUE Weight Bearing: Partial weight bearing LUE Partial Weight Bearing Percentage or Pounds: Sternal Precautions    Mobility  Bed Mobility Overal bed mobility: Needs Assistance Bed Mobility: Supine to Sit     Supine to sit: Min assist;HOB elevated     General bed mobility comments: Assist with trunk to get to EOB, cues to refrain from pulling with UEs.  Transfers Overall transfer level: Needs assistance Equipment used: Rolling walker (2 wheeled) Transfers: Sit to/from Stand Sit to Stand: Min assist         General transfer comment: Assist to power to standing using momentum with cues for hand placement as pt trying to pull  up onR W. Transferred to chair post ambulation.  Ambulation/Gait Ambulation/Gait assistance: Min guard Gait Distance (Feet): 180 Feet Assistive device: Rolling walker (2 wheeled) Gait Pattern/deviations: Step-through pattern;Decreased stride length;Shuffle Gait velocity: .62 ft/sec Gait velocity interpretation: <1.31 ft/sec, indicative of household ambulator General Gait Details: Slow, mostly steady gait with 2 standing rest breaks due to 2/4 DOE. Cues for RW proximity/posture.   Stairs             Wheelchair Mobility    Modified Rankin (Stroke Patients Only)       Balance Overall balance assessment: Needs assistance Sitting-balance support: Feet supported;No upper extremity supported Sitting balance-Leahy Scale: Fair Sitting balance - Comments: supervision   Standing balance support: During functional activity Standing balance-Leahy Scale: Poor Standing balance comment: UE support for balance                            Cognition Arousal/Alertness: Awake/alert Behavior During Therapy: WFL for tasks assessed/performed Overall Cognitive Status: Impaired/Different from baseline Area of Impairment: Memory;Following commands;Safety/judgement                     Memory: Decreased recall of precautions;Decreased short-term memory Following Commands: Follows one step commands consistently;Follows one step commands with increased time Safety/Judgement: Decreased awareness of safety;Decreased awareness of deficits     General Comments: unable to recall any precautions and verbalizaing very little when directed a question.      Exercises      General Comments General comments (skin integrity, edema, etc.): VSS on RA.  Pertinent Vitals/Pain Pain Assessment: No/denies pain    Home Living                      Prior Function            PT Goals (current goals can now be found in the care plan section) Progress towards PT goals:  Progressing toward goals(slowly)    Frequency    Min 3X/week      PT Plan Current plan remains appropriate    Co-evaluation              AM-PAC PT "6 Clicks" Mobility   Outcome Measure  Help needed turning from your back to your side while in a flat bed without using bedrails?: A Little Help needed moving from lying on your back to sitting on the side of a flat bed without using bedrails?: A Little Help needed moving to and from a bed to a chair (including a wheelchair)?: A Little Help needed standing up from a chair using your arms (e.g., wheelchair or bedside chair)?: A Little Help needed to walk in hospital room?: A Little Help needed climbing 3-5 steps with a railing? : A Lot 6 Click Score: 17    End of Session Equipment Utilized During Treatment: Gait belt Activity Tolerance: Patient limited by fatigue Patient left: in chair;with call bell/phone within reach Nurse Communication: Mobility status PT Visit Diagnosis: Other abnormalities of gait and mobility (R26.89);Difficulty in walking, not elsewhere classified (R26.2);Muscle weakness (generalized) (M62.81)     Time: 6384-6659 PT Time Calculation (min) (ACUTE ONLY): 18 min  Charges:  $Gait Training: 8-22 mins                     Marisa Severin, PT, DPT Acute Rehabilitation Services Pager [phone] Office [place] 02/15/2020, 10:30 AM

## 2020-02-15 NOTE — Progress Notes (Addendum)
[id number] Began education with son and pt. Asked if I come return when daughter here. Pt was incontinent of urine as stated she had called and could not hold.  Helped pt get cleaned up and put on BSC. Then back to recliner with call bell.  [id number] Reviewed sternal precautions, IS, gave diabetic and heart healthy diets and discussed some healthy choices, written walking instructions with daughter. Pt has walker in room and needs assistance when up. Discussed CRP 2 and will refer to [place]. Pt's daughter would like to be notified if transportation assistance is available. 3n1 in room and briefly reviewed per request. Also encouraged them to review handout that came with it. Notified RN that family would like to see case manager, PT re stairs, PA.    Encouraged smoking cessation and gave handout. Pointed out 1800quitnow number if needed.  Luetta Nutting RN BSN 02/15/2020 10:50 AM

## 2020-02-15 NOTE — Progress Notes (Signed)
Progress Note  Patient Name: Bianca [name] Date of Encounter: 02/15/2020  Primary Cardiologist: Quay Burow, MD   Subjective   Patient is POD 7 from CABG x4. Discharge today. Patient feels ready to go home. Denies chest pain or sob. She is volume up on exam.   Inpatient Medications    Scheduled Meds: . amLODipine  5 mg Oral Daily  . aspirin EC  325 mg Oral Daily   Or  . aspirin  324 mg Per Tube Daily  . atorvastatin  80 mg Oral Daily  . bisacodyl  10 mg Oral Daily   Or  . bisacodyl  10 mg Rectal Daily  . Chlorhexidine Gluconate Cloth  6 each Topical Once  . docusate sodium  200 mg Oral Daily  . enoxaparin (LOVENOX) injection  30 mg Subcutaneous Daily  . furosemide  40 mg Oral Daily  . insulin aspart  0-24 Units Subcutaneous TID AC & HS  . losartan  50 mg Oral Daily  . mouth rinse  15 mL Mouth Rinse BID  . metFORMIN  1,000 mg Oral BID WC  . metoprolol tartrate  25 mg Oral BID  . pantoprazole  40 mg Oral Daily  . potassium chloride  40 mEq Oral Daily   Continuous Infusions: . lactated ringers     PRN Meds: dextrose, lactated ringers, metoprolol tartrate, ondansetron (ZOFRAN) IV, oxyCODONE, sodium chloride flush   Vital Signs    Vitals:   02/14/20 2004 02/14/20 2328 02/15/20 0335 02/15/20 0758  BP: (!) 146/75 134/67 (!) 144/75 (!) 142/74  Pulse: 86 75 87 89  Resp: 20 16 20  (!) 21  Temp: 98.2 F (36.8 C) 98.3 F (36.8 C) 98.3 F (36.8 C) 98.3 F (36.8 C)  TempSrc: Oral Oral Oral Oral  SpO2: 98% 99% 97% 98%  Weight:   74.5 kg   Height:        Intake/Output Summary (Last 24 hours) at 02/15/2020 0904 Last data filed at 02/14/2020 2100 Gross per 24 hour  Intake 120 ml  Output 1100 ml  Net -980 ml   Last 3 Weights 02/15/2020 02/14/2020 02/13/2020  Weight (lbs) 164 lb 3.2 oz 170 lb 12.8 oz 171 lb 1.6 oz  Weight (kg) 74.481 kg 77.474 kg 77.61 kg      Telemetry    NSR HR 80-90s - Personally Reviewed  ECG    No new - Personally Reviewed  Physical Exam    GEN: No acute distress.   Neck: + JVD Cardiac: RRR, no murmurs, rubs, or gallops.  Respiratory: Clear to auscultation bilaterally. GI: Soft, nontender, non-distended  MS: 1+ edema; No deformity. Neuro:  Nonfocal  Psych: Normal affect   Labs    High Sensitivity Troponin:   Recent Labs  Lab 02/02/20 1642 02/02/20 2235 02/03/20 1712  TROPONINIHS 25* 28* 24*      Chemistry Recent Labs  Lab 02/13/20 0341 02/14/20 0231 02/15/20 0323  NA 142 140 142  K 3.6 3.3* 3.4*  CL 112* 109 108  CO2 23 24 27   GLUCOSE 126* 104* 95  BUN 45* 32* 17  CREATININE 1.55* 1.28* 0.95  CALCIUM 8.0* 7.8* 7.9*  GFRNONAA 33* 41* 59*  GFRAA 38* 48* >60  ANIONGAP 7 7 7      Hematology Recent Labs  Lab 02/10/20 0321 02/11/20 0420 02/12/20 0449  WBC 13.0* 10.1 9.3  RBC 2.96* 2.67* 2.74*  HGB 8.1* 7.2* 7.7*  HCT 25.7* 23.9* 24.8*  MCV 86.8 89.5 90.5  MCH 27.4 27.0  28.1  MCHC 31.5 30.1 31.0  RDW 14.7 15.2 15.5  PLT 84* 80* 112*    BNPNo results for input(s): BNP, PROBNP in the last 168 hours.   DDimer No results for input(s): DDIMER in the last 168 hours.   Radiology    No results found.  Cardiac Studies   ECHO:01/06/2020 1. Left ventricular ejection fraction, by estimation, is 50 to 55%. The  left ventricle has low normal function. The left ventricle has no regional  wall motion abnormalities. There is mild concentric left ventricular  hypertrophy. Indeterminate diastolic filling due to E-A fusion.  2. Right ventricular systolic function is normal. The right ventricular  size is normal. Tricuspid regurgitation signal is inadequate for assessing  PA pressure.  3. The mitral valve is grossly normal. Trivial mitral valve  regurgitation.  4. The aortic valve is tricuspid. Aortic valve regurgitation is not  visualized. No aortic stenosis is present.  5. The inferior vena cava is normal in size with greater than 50%  respiratory variability, suggesting right atrial  pressure of 3 mmHg.   CATH:01/31/2020 Diagnostic Dominance: Left    Intraoperative TEE 02/08/20 Complications: No known complications during this procedure.  POST-OP IMPRESSIONS  - Left Ventricle: The left ventricle is unchanged from pre-bypass.  - Right Ventricle: The right ventricle appears unchanged from pre-bypass.  - Aorta: The aorta appears unchanged from pre-bypass.  - Left Atrium: The left atrium appears unchanged from pre-bypass.  - Left Atrial Appendage: The left atrial appendage appears unchanged from  pre-bypass.  - Aortic Valve: The aortic valve appears unchanged from pre-bypass.  - Mitral Valve: The mitral valve appears unchanged from pre-bypass.  - Tricuspid Valve: The tricuspid valve appears unchanged from pre-bypass.  - Interatrial Septum: The interatrial septum appears unchanged from  pre-bypass.  - Interventricular Septum: The interventricular septum appears unchanged  from  pre-bypass.  - Pericardium: The pericardium appears unchanged from pre-bypass.  Patient Profile     74 y.o. female with a hx of hypertensive urgency and acute pulmonary edemawith NSTEMI03/2021,HTN,HLD, DM,tobacco abuse, CAD, RBBB,who was seen for unstable angina and underwent cath showing 3V disease now s/p CABG x4.   Assessment & Plan   CAD s/p CABG x4/POD 7 - Pt has been recovering well with no immediate complications - continue ASA, statin, BB - pt has follow-up with general cards 5/18  HTN - amlodipine 5 mg, Lopressor 25mg  BID, losartan 100mg  - creatinine 0.95 - pressures reasonable  HLD - Lipitor 80 mg daily - LDL 152 - lipid panel and LFTs in 6-8 weeks  Volume overload - plan to discharge with short course of lasix 40 mg daily  Weakness - HH at discharge  For questions or updates, please contact [facility] Please consult [email or web address] for contact info under        Signed, Alline Pio , PA-C  02/15/2020, 9:04 AM

## 2020-02-15 NOTE — Progress Notes (Signed)
Occupational Therapy Treatment Patient Details Name: Bianca [name] MRN: [id number] DOB: 12-Sep-1946 Today's Date: 02/15/2020    History of present illness 74 yo admitted with chest pain and [place]. Pt with MI in 12/2019 who refused CABG and had cath 01/31/20. Pt now s/p CABG x 4 4/30. PMhx: HTN, HLD, DM, CAD, tobacco use   OT comments  Pt. Seen for skilled OT treatment session with focus on sternal precauations during adl completion.  Pt. Unable to recall precautions with multiple review and demo. Heavy mod a to transition into standing while utilizing "no pulling".  Pt. Reports son with her at home but not on the days he works. At this point pt. Will require 24/7 with caregiver ability to physically assist. Need to verify pt. Will have this when planning for home.   Follow Up Recommendations  Home health OT;Supervision/Assistance - 24 hour    Equipment Recommendations  3 in 1 bedside commode    Recommendations for Other Services      Precautions / Restrictions Precautions Precautions: Fall;Sternal Precaution Booklet Issued: No Precaution Comments: needs frequent reminders for sternal precautions Restrictions Weight Bearing Restrictions: Yes RUE Weight Bearing: Partial weight bearing RUE Partial Weight Bearing Percentage or Pounds: Sternal Precautions LUE Weight Bearing: Partial weight bearing LUE Partial Weight Bearing Percentage or Pounds: Sternal Precautions       Mobility Bed Mobility Overal bed mobility: Needs Assistance Bed Mobility: Supine to Sit     Supine to sit: Min assist;HOB elevated     General bed mobility comments: seated in recliner at beginning and end of session  Transfers Overall transfer level: Needs assistance Equipment used: Rolling walker (2 wheeled) Transfers: Sit to/from Stand Sit to Stand: Mod assist         General transfer comment: Assist to power to standing using momentum with cues for hand placement with use of pillow    Balance  Overall balance assessment: Needs assistance Sitting-balance support: Feet supported;No upper extremity supported Sitting balance-Leahy Scale: Fair Sitting balance - Comments: supervision   Standing balance support: During functional activity Standing balance-Leahy Scale: Poor Standing balance comment: UE support for balance                           ADL either performed or assessed with clinical judgement   ADL Overall ADL's : Needs assistance/impaired                     Lower Body Dressing: Min guard;Sitting/lateral leans Lower Body Dressing Details (indicate cue type and reason): able to bend forward to reach b les and also cross each leg over knees to reach les for LB adls (practiced with socks today)               General ADL Comments: pt. limited by cognitive presentation-ie: when asked about precautions pt. states "i cant remember". reviewed and provided demo, asked pt. to repeat and demo back. she cont. to stare at the tv and then with cues for redirection stated "i dont remember".  heavy assist to transtion into standing. max cues for sternal precautions. poor activity tolerance wanting to sit after standing for seconds     Vision       Perception     Praxis      Cognition Arousal/Alertness: Awake/alert Behavior During Therapy: WFL for tasks assessed/performed Overall Cognitive Status: Impaired/Different from baseline Area of Impairment: Memory;Following commands;Safety/judgement  Memory: Decreased recall of precautions;Decreased short-term memory Following Commands: Follows one step commands consistently;Follows one step commands with increased time Safety/Judgement: Decreased awareness of safety;Decreased awareness of deficits Awareness: Intellectual Problem Solving: Slow processing General Comments: unable to recall any precautions and verbalizaing very little when directed a question.        Exercises      Shoulder Instructions       General Comments VSS on RA.    Pertinent Vitals/ Pain       Pain Assessment: No/denies pain  Home Living                                          Prior Functioning/Environment              Frequency  Min 2X/week        Progress Toward Goals  OT Goals(current goals can now be found in the care plan section)  Progress towards OT goals: Progressing toward goals     Plan      Co-evaluation                 AM-PAC OT "6 Clicks" Daily Activity     Outcome Measure   Help from another person eating meals?: None Help from another person taking care of personal grooming?: A Little Help from another person toileting, which includes using toliet, bedpan, or urinal?: A Lot Help from another person bathing (including washing, rinsing, drying)?: A Lot Help from another person to put on and taking off regular upper body clothing?: A Little Help from another person to put on and taking off regular lower body clothing?: A Lot 6 Click Score: 16    End of Session Equipment Utilized During Treatment: Other (comment)(heart pillow)  OT Visit Diagnosis: Unsteadiness on feet (R26.81);Muscle weakness (generalized) (M62.81);Other symptoms and signs involving cognitive function   Activity Tolerance Patient tolerated treatment well   Patient Left in chair;with call bell/phone within reach   Nurse Communication          Time: 0823-0832 OT Time Calculation (min): 9 min  Charges: OT General Charges $OT Visit: 1 Visit OT Treatments $Self Care/Home Management : 8-22 mins  Sonia Baller, COTA/L Acute Rehabilitation [phone]   Janice Coffin 02/15/2020, 10:49 AM

## 2020-02-16 DIAGNOSIS — B351 Tinea unguium: Secondary | ICD-10-CM | POA: Diagnosis not present

## 2020-02-16 DIAGNOSIS — E876 Hypokalemia: Secondary | ICD-10-CM | POA: Diagnosis not present

## 2020-02-16 DIAGNOSIS — Z48812 Encounter for surgical aftercare following surgery on the circulatory system: Secondary | ICD-10-CM | POA: Diagnosis not present

## 2020-02-16 DIAGNOSIS — I110 Hypertensive heart disease with heart failure: Secondary | ICD-10-CM | POA: Diagnosis not present

## 2020-02-16 DIAGNOSIS — E119 Type 2 diabetes mellitus without complications: Secondary | ICD-10-CM | POA: Diagnosis not present

## 2020-02-16 DIAGNOSIS — I214 Non-ST elevation (NSTEMI) myocardial infarction: Secondary | ICD-10-CM | POA: Diagnosis not present

## 2020-02-16 DIAGNOSIS — Z7984 Long term (current) use of oral hypoglycemic drugs: Secondary | ICD-10-CM | POA: Diagnosis not present

## 2020-02-16 DIAGNOSIS — I25110 Atherosclerotic heart disease of native coronary artery with unstable angina pectoris: Secondary | ICD-10-CM | POA: Diagnosis not present

## 2020-02-16 DIAGNOSIS — R410 Disorientation, unspecified: Secondary | ICD-10-CM | POA: Diagnosis not present

## 2020-02-16 DIAGNOSIS — I5030 Unspecified diastolic (congestive) heart failure: Secondary | ICD-10-CM | POA: Diagnosis not present

## 2020-02-16 DIAGNOSIS — E785 Hyperlipidemia, unspecified: Secondary | ICD-10-CM | POA: Diagnosis not present

## 2020-02-16 DIAGNOSIS — Z7982 Long term (current) use of aspirin: Secondary | ICD-10-CM | POA: Diagnosis not present

## 2020-02-16 DIAGNOSIS — D62 Acute posthemorrhagic anemia: Secondary | ICD-10-CM | POA: Diagnosis not present

## 2020-02-16 DIAGNOSIS — F17210 Nicotine dependence, cigarettes, uncomplicated: Secondary | ICD-10-CM | POA: Diagnosis not present

## 2020-02-16 DIAGNOSIS — Z951 Presence of aortocoronary bypass graft: Secondary | ICD-10-CM | POA: Diagnosis not present

## 2020-02-18 ENCOUNTER — Telehealth: Payer: Self-pay

## 2020-02-18 ENCOUNTER — Telehealth (HOSPITAL_COMMUNITY): Payer: Self-pay

## 2020-02-18 DIAGNOSIS — I5030 Unspecified diastolic (congestive) heart failure: Secondary | ICD-10-CM | POA: Diagnosis not present

## 2020-02-18 DIAGNOSIS — I110 Hypertensive heart disease with heart failure: Secondary | ICD-10-CM | POA: Diagnosis not present

## 2020-02-18 DIAGNOSIS — I25110 Atherosclerotic heart disease of native coronary artery with unstable angina pectoris: Secondary | ICD-10-CM | POA: Diagnosis not present

## 2020-02-18 DIAGNOSIS — E119 Type 2 diabetes mellitus without complications: Secondary | ICD-10-CM | POA: Diagnosis not present

## 2020-02-18 DIAGNOSIS — Z48812 Encounter for surgical aftercare following surgery on the circulatory system: Secondary | ICD-10-CM | POA: Diagnosis not present

## 2020-02-18 DIAGNOSIS — I214 Non-ST elevation (NSTEMI) myocardial infarction: Secondary | ICD-10-CM | POA: Diagnosis not present

## 2020-02-18 MED FILL — Electrolyte-R (PH 7.4) Solution: INTRAVENOUS | Qty: 6000 | Status: AC

## 2020-02-18 MED FILL — Calcium Chloride Inj 10%: INTRAVENOUS | Qty: 10 | Status: AC

## 2020-02-18 MED FILL — Sodium Bicarbonate IV Soln 8.4%: INTRAVENOUS | Qty: 50 | Status: AC

## 2020-02-18 MED FILL — Albumin, Human Inj 5%: INTRAVENOUS | Qty: 250 | Status: AC

## 2020-02-18 MED FILL — Sodium Chloride IV Soln 0.9%: INTRAVENOUS | Qty: 2000 | Status: AC

## 2020-02-18 NOTE — Telephone Encounter (Signed)
Pt insurance is active and benefits verified through Medicare A/B. Co-pay $0.00, DED $0.00/$0.00 met, out of pocket $0.00/$0.00 met, co-insurance 0%. No pre-authorization required. Passport, 02/18/20 @ 10:13AM, OHF#[id number]  Pt insurance is active through Medicaid. VVK#[id number]  Will fax over Medicaid Reimbursement form to Dr. Gwenlyn Found  Will contact patient to see if she is interested in the Cardiac Rehab Program. If interested, patient will need to complete follow up appt. Once completed, patient will be contacted for scheduling upon review by the RN Navigator.

## 2020-02-18 NOTE — Telephone Encounter (Signed)
Attempted to call patient in regards to Cardiac Rehab - LM on VM 

## 2020-02-18 NOTE — Telephone Encounter (Signed)
Patient's daughter, Amado Coe contacted the office concerned about her mother's [facility] site post CABG with Dr. Cliffton Asters.  Stated her mother was discharged from the hospital on Friday and thinks she may have "bumped" the side of her leg getting out of the bed.  The scab that was formed over the incision site was removed and it started to bleed.  She reiterated the fact that it was not a lot and it did stop once back in the bed.  I advised her to wrap the area with gauze and an ace bandage to place pressure to prevent further trauma to the leg.  Advised to contact the office back if it did not stop bleeding or if there were signs/ symptoms of infection such as redness, red streaks, puss, increased drainage from the site, temperature.  She acknowledged receipt.

## 2020-02-19 DIAGNOSIS — I25110 Atherosclerotic heart disease of native coronary artery with unstable angina pectoris: Secondary | ICD-10-CM | POA: Diagnosis not present

## 2020-02-19 DIAGNOSIS — I214 Non-ST elevation (NSTEMI) myocardial infarction: Secondary | ICD-10-CM | POA: Diagnosis not present

## 2020-02-19 DIAGNOSIS — I5030 Unspecified diastolic (congestive) heart failure: Secondary | ICD-10-CM | POA: Diagnosis not present

## 2020-02-19 DIAGNOSIS — I110 Hypertensive heart disease with heart failure: Secondary | ICD-10-CM | POA: Diagnosis not present

## 2020-02-19 DIAGNOSIS — Z48812 Encounter for surgical aftercare following surgery on the circulatory system: Secondary | ICD-10-CM | POA: Diagnosis not present

## 2020-02-19 DIAGNOSIS — E119 Type 2 diabetes mellitus without complications: Secondary | ICD-10-CM | POA: Diagnosis not present

## 2020-02-20 ENCOUNTER — Telehealth: Payer: Self-pay

## 2020-02-20 NOTE — Telephone Encounter (Signed)
Patient's daughter, Amado Coe contacted the office concerned about her mother's EVH site right leg.  She is s/p CABG with Dr. Cliffton Asters.  Patient is complaining of incision site pain and swelling.  Daughter states that she is limping as well.  She stated that she is only taking Tylenol for pain.  Patient advised to also take no more than 2 doses of Ibuprofen for pain as well.  Daughter acknowledged receipt.  Threasa Alpha, PT with [facility] contacted the office [phone] advising patient's [facility] site looked well, is draining clear/ blood tinged fluid at times but she was not running at fever and the site did not look infected.  Verbal orders were given for a nurse visit for evaluation and treatment.  Patient does have a follow-up appointment with Dr. Cliffton Asters tomorrow, 5/13 which patient and daughter is aware of.

## 2020-02-21 ENCOUNTER — Other Ambulatory Visit: Payer: Self-pay | Admitting: Thoracic Surgery (Cardiothoracic Vascular Surgery)

## 2020-02-21 ENCOUNTER — Other Ambulatory Visit: Payer: Self-pay

## 2020-02-21 ENCOUNTER — Ambulatory Visit (INDEPENDENT_AMBULATORY_CARE_PROVIDER_SITE_OTHER): Payer: Self-pay | Admitting: Thoracic Surgery (Cardiothoracic Vascular Surgery)

## 2020-02-21 ENCOUNTER — Encounter: Payer: Self-pay | Admitting: Thoracic Surgery (Cardiothoracic Vascular Surgery)

## 2020-02-21 VITALS — BP 100/53 | HR 75 | Temp 97.7°F | Resp 20 | Ht 66.0 in | Wt 162.0 lb

## 2020-02-21 DIAGNOSIS — Z951 Presence of aortocoronary bypass graft: Secondary | ICD-10-CM

## 2020-02-21 MED ORDER — CEPHALEXIN 500 MG PO CAPS: 500.0000 mg | ORAL_CAPSULE | Freq: Three times a day (TID) | ORAL | 0 refills | Status: DC

## 2020-02-21 MED ORDER — POTASSIUM CHLORIDE CRYS ER 20 MEQ PO TBCR: 40.0000 meq | EXTENDED_RELEASE_TABLET | Freq: Every day | ORAL | 0 refills | Status: DC

## 2020-02-21 MED ORDER — FUROSEMIDE 40 MG PO TABS: 40.0000 mg | ORAL_TABLET | Freq: Every day | ORAL | 0 refills | Status: DC

## 2020-02-21 NOTE — Progress Notes (Signed)
      [address]       [place] [address]             [phone]        [facility] Medical Record #[id number] Date of Birth: Feb 26, 1946  Referring: Runell Gess, MD Primary Care: Luciano Cutter, PA-C Primary Cardiologist:Jonathan Allyson Sabal, MD  Reason for visit:   follow-up  History of Present Illness:     [name] comes in for her first follow-up.  She has some confusion but this is improving.  Right lower extremity leg incision has opened and had some drainage for the past few days.  She continues with physical therapy but remains very weak and deconditioned.  Physical Exam: BP (!) 100/53 (BP Location: Right Arm)   Pulse 75   Temp 97.7 F (36.5 C) (Temporal)   Resp 20   Ht 5\' 6"  (1.676 m)   Wt 162 lb (73.5 kg)   SpO2 98% Comment: RA  BMI 26.15 kg/m   Alert NAD Incision clean.  Sternum stable Abdomen soft, ND 1+ peripheral edema.  Her incision by her knee has had some necrosis superiorly.  There is some firmness but no significant erythema.      Assessment / Plan:   74 year old female status post CABG.  Mild dehiscence of her right lower extremity incision. Recommend wet-to-dry dressing changes twice daily to her leg but also prescribe a 7-day course of Keflex. She will return to clinic in 1 month with a chest x-ray   66 02/21/2020 1:14 PM

## 2020-02-21 NOTE — Addendum Note (Signed)
Addended by: Corliss Skains on: 02/21/2020 01:22 PM   Modules accepted: Orders

## 2020-02-22 DIAGNOSIS — I110 Hypertensive heart disease with heart failure: Secondary | ICD-10-CM | POA: Diagnosis not present

## 2020-02-22 DIAGNOSIS — Z48812 Encounter for surgical aftercare following surgery on the circulatory system: Secondary | ICD-10-CM | POA: Diagnosis not present

## 2020-02-22 DIAGNOSIS — I25110 Atherosclerotic heart disease of native coronary artery with unstable angina pectoris: Secondary | ICD-10-CM | POA: Diagnosis not present

## 2020-02-22 DIAGNOSIS — E119 Type 2 diabetes mellitus without complications: Secondary | ICD-10-CM | POA: Diagnosis not present

## 2020-02-22 DIAGNOSIS — I214 Non-ST elevation (NSTEMI) myocardial infarction: Secondary | ICD-10-CM | POA: Diagnosis not present

## 2020-02-22 DIAGNOSIS — I5030 Unspecified diastolic (congestive) heart failure: Secondary | ICD-10-CM | POA: Diagnosis not present

## 2020-02-23 DIAGNOSIS — I214 Non-ST elevation (NSTEMI) myocardial infarction: Secondary | ICD-10-CM | POA: Diagnosis not present

## 2020-02-23 DIAGNOSIS — E119 Type 2 diabetes mellitus without complications: Secondary | ICD-10-CM | POA: Diagnosis not present

## 2020-02-23 DIAGNOSIS — I110 Hypertensive heart disease with heart failure: Secondary | ICD-10-CM | POA: Diagnosis not present

## 2020-02-23 DIAGNOSIS — I5030 Unspecified diastolic (congestive) heart failure: Secondary | ICD-10-CM | POA: Diagnosis not present

## 2020-02-23 DIAGNOSIS — Z48812 Encounter for surgical aftercare following surgery on the circulatory system: Secondary | ICD-10-CM | POA: Diagnosis not present

## 2020-02-23 DIAGNOSIS — I25110 Atherosclerotic heart disease of native coronary artery with unstable angina pectoris: Secondary | ICD-10-CM | POA: Diagnosis not present

## 2020-02-25 ENCOUNTER — Inpatient Hospital Stay (HOSPITAL_COMMUNITY): Payer: Medicare Other

## 2020-02-25 ENCOUNTER — Inpatient Hospital Stay (HOSPITAL_COMMUNITY)
Admission: AD | Admit: 2020-02-25 | Discharge: 2020-03-04 | DRG: 902 | Disposition: A | Discharge status: Home Health | Payer: Medicare Other | Attending: Thoracic Surgery (Cardiothoracic Vascular Surgery) | Admitting: Thoracic Surgery (Cardiothoracic Vascular Surgery)

## 2020-02-25 ENCOUNTER — Telehealth: Payer: Self-pay

## 2020-02-25 ENCOUNTER — Other Ambulatory Visit: Payer: Self-pay

## 2020-02-25 ENCOUNTER — Encounter (HOSPITAL_COMMUNITY): Payer: Self-pay | Admitting: Internal Medicine

## 2020-02-25 DIAGNOSIS — Z7984 Long term (current) use of oral hypoglycemic drugs: Secondary | ICD-10-CM | POA: Diagnosis not present

## 2020-02-25 DIAGNOSIS — D638 Anemia in other chronic diseases classified elsewhere: Secondary | ICD-10-CM | POA: Diagnosis present

## 2020-02-25 DIAGNOSIS — T8131XA Disruption of external operation (surgical) wound, not elsewhere classified, initial encounter: Principal | ICD-10-CM | POA: Diagnosis present

## 2020-02-25 DIAGNOSIS — I252 Old myocardial infarction: Secondary | ICD-10-CM | POA: Diagnosis not present

## 2020-02-25 DIAGNOSIS — Z79899 Other long term (current) drug therapy: Secondary | ICD-10-CM

## 2020-02-25 DIAGNOSIS — I110 Hypertensive heart disease with heart failure: Secondary | ICD-10-CM | POA: Diagnosis not present

## 2020-02-25 DIAGNOSIS — E785 Hyperlipidemia, unspecified: Secondary | ICD-10-CM | POA: Diagnosis present

## 2020-02-25 DIAGNOSIS — I2510 Atherosclerotic heart disease of native coronary artery without angina pectoris: Secondary | ICD-10-CM | POA: Diagnosis present

## 2020-02-25 DIAGNOSIS — Z87891 Personal history of nicotine dependence: Secondary | ICD-10-CM

## 2020-02-25 DIAGNOSIS — E1169 Type 2 diabetes mellitus with other specified complication: Secondary | ICD-10-CM | POA: Diagnosis not present

## 2020-02-25 DIAGNOSIS — M7989 Other specified soft tissue disorders: Secondary | ICD-10-CM | POA: Diagnosis not present

## 2020-02-25 DIAGNOSIS — I25118 Atherosclerotic heart disease of native coronary artery with other forms of angina pectoris: Secondary | ICD-10-CM

## 2020-02-25 DIAGNOSIS — Z48812 Encounter for surgical aftercare following surgery on the circulatory system: Secondary | ICD-10-CM | POA: Diagnosis not present

## 2020-02-25 DIAGNOSIS — E782 Mixed hyperlipidemia: Secondary | ICD-10-CM | POA: Diagnosis not present

## 2020-02-25 DIAGNOSIS — E440 Moderate protein-calorie malnutrition: Secondary | ICD-10-CM | POA: Insufficient documentation

## 2020-02-25 DIAGNOSIS — I10 Essential (primary) hypertension: Secondary | ICD-10-CM | POA: Diagnosis not present

## 2020-02-25 DIAGNOSIS — I5030 Unspecified diastolic (congestive) heart failure: Secondary | ICD-10-CM | POA: Diagnosis not present

## 2020-02-25 DIAGNOSIS — Y832 Surgical operation with anastomosis, bypass or graft as the cause of abnormal reaction of the patient, or of later complication, without mention of misadventure at the time of the procedure: Secondary | ICD-10-CM | POA: Diagnosis present

## 2020-02-25 DIAGNOSIS — Z951 Presence of aortocoronary bypass graft: Secondary | ICD-10-CM | POA: Diagnosis not present

## 2020-02-25 DIAGNOSIS — M79609 Pain in unspecified limb: Secondary | ICD-10-CM

## 2020-02-25 DIAGNOSIS — I5032 Chronic diastolic (congestive) heart failure: Secondary | ICD-10-CM

## 2020-02-25 DIAGNOSIS — I25110 Atherosclerotic heart disease of native coronary artery with unstable angina pectoris: Secondary | ICD-10-CM | POA: Diagnosis not present

## 2020-02-25 DIAGNOSIS — Z7982 Long term (current) use of aspirin: Secondary | ICD-10-CM

## 2020-02-25 DIAGNOSIS — T8130XA Disruption of wound, unspecified, initial encounter: Secondary | ICD-10-CM | POA: Diagnosis present

## 2020-02-25 DIAGNOSIS — R197 Diarrhea, unspecified: Secondary | ICD-10-CM | POA: Diagnosis present

## 2020-02-25 DIAGNOSIS — E876 Hypokalemia: Secondary | ICD-10-CM | POA: Diagnosis present

## 2020-02-25 DIAGNOSIS — Z20822 Contact with and (suspected) exposure to covid-19: Secondary | ICD-10-CM | POA: Diagnosis present

## 2020-02-25 DIAGNOSIS — J9811 Atelectasis: Secondary | ICD-10-CM | POA: Diagnosis not present

## 2020-02-25 DIAGNOSIS — I214 Non-ST elevation (NSTEMI) myocardial infarction: Secondary | ICD-10-CM | POA: Diagnosis not present

## 2020-02-25 DIAGNOSIS — Z09 Encounter for follow-up examination after completed treatment for conditions other than malignant neoplasm: Secondary | ICD-10-CM

## 2020-02-25 DIAGNOSIS — E119 Type 2 diabetes mellitus without complications: Secondary | ICD-10-CM | POA: Diagnosis present

## 2020-02-25 DIAGNOSIS — J90 Pleural effusion, not elsewhere classified: Secondary | ICD-10-CM | POA: Diagnosis not present

## 2020-02-25 DIAGNOSIS — Z6825 Body mass index (BMI) 25.0-25.9, adult: Secondary | ICD-10-CM

## 2020-02-25 DIAGNOSIS — I509 Heart failure, unspecified: Secondary | ICD-10-CM

## 2020-02-25 LAB — CBC WITH DIFFERENTIAL/PLATELET
Abs Immature Granulocytes: 0.05 10*3/uL (ref 0.00–0.07)
Basophils Absolute: 0 10*3/uL (ref 0.0–0.1)
Basophils Relative: 1 %
Eosinophils Absolute: 0.1 10*3/uL (ref 0.0–0.5)
Eosinophils Relative: 2 %
HCT: 26.7 % — ABNORMAL LOW (ref 36.0–46.0)
Hemoglobin: 8.1 g/dL — ABNORMAL LOW (ref 12.0–15.0)
Immature Granulocytes: 1 %
Lymphocytes Relative: 15 %
Lymphs Abs: 1.2 10*3/uL (ref 0.7–4.0)
MCH: 26.9 pg (ref 26.0–34.0)
MCHC: 30.3 g/dL (ref 30.0–36.0)
MCV: 88.7 fL (ref 80.0–100.0)
Monocytes Absolute: 0.5 10*3/uL (ref 0.1–1.0)
Monocytes Relative: 7 %
Neutro Abs: 6.2 10*3/uL (ref 1.7–7.7)
Neutrophils Relative %: 74 %
Platelets: 341 10*3/uL (ref 150–400)
RBC: 3.01 MIL/uL — ABNORMAL LOW (ref 3.87–5.11)
RDW: 15.5 % (ref 11.5–15.5)
WBC: 8.2 10*3/uL (ref 4.0–10.5)
nRBC: 0 % (ref 0.0–0.2)

## 2020-02-25 LAB — COMPREHENSIVE METABOLIC PANEL
ALT: 23 U/L (ref 0–44)
AST: 14 U/L — ABNORMAL LOW (ref 15–41)
Albumin: 2.9 g/dL — ABNORMAL LOW (ref 3.5–5.0)
Alkaline Phosphatase: 233 U/L — ABNORMAL HIGH (ref 38–126)
Anion gap: 10 (ref 5–15)
BUN: 21 mg/dL (ref 8–23)
CO2: 24 mmol/L (ref 22–32)
Calcium: 8.9 mg/dL (ref 8.9–10.3)
Chloride: 106 mmol/L (ref 98–111)
Creatinine, Ser: 1.26 mg/dL — ABNORMAL HIGH (ref 0.44–1.00)
GFR calc Af Amer: 48 mL/min — ABNORMAL LOW (ref >60)
GFR calc non Af Amer: 42 mL/min — ABNORMAL LOW (ref >60)
Glucose, Bld: 107 mg/dL — ABNORMAL HIGH (ref 70–99)
Potassium: 4.9 mmol/L (ref 3.5–5.1)
Sodium: 140 mmol/L (ref 135–145)
Total Bilirubin: 0.6 mg/dL (ref 0.3–1.2)
Total Protein: 6.4 g/dL — ABNORMAL LOW (ref 6.5–8.1)

## 2020-02-25 LAB — SARS CORONAVIRUS 2 BY RT PCR (HOSPITAL ORDER, PERFORMED IN ~~LOC~~ HOSPITAL LAB): SARS Coronavirus 2: NEGATIVE

## 2020-02-25 LAB — GLUCOSE, CAPILLARY
Glucose-Capillary: 96 mg/dL (ref 70–99)
Glucose-Capillary: 162 mg/dL — ABNORMAL HIGH (ref 70–99)

## 2020-02-25 LAB — PROTIME-INR
INR: 1.2 (ref 0.8–1.2)
Prothrombin Time: 14.4 seconds (ref 11.4–15.2)

## 2020-02-25 MED ORDER — CALCIUM CARBONATE ANTACID 500 MG PO CHEW: 1.0000 | CHEWABLE_TABLET | Freq: Every day | ORAL | Status: DC | PRN

## 2020-02-25 MED ORDER — ASPIRIN EC 325 MG PO TBEC
325.0000 mg | DELAYED_RELEASE_TABLET | Freq: Every day | ORAL | Status: DC
  Administered 2020-02-26 – 2020-03-04 (×7): 325 mg via ORAL
  Filled 2020-02-25 (×8): qty 1

## 2020-02-25 MED ORDER — ACETAMINOPHEN 650 MG RE SUPP: 650.0000 mg | Freq: Four times a day (QID) | RECTAL | Status: DC | PRN

## 2020-02-25 MED ORDER — LOSARTAN POTASSIUM 50 MG PO TABS
50.0000 mg | ORAL_TABLET | Freq: Every day | ORAL | Status: DC
  Administered 2020-02-26 – 2020-03-04 (×7): 50 mg via ORAL
  Filled 2020-02-25 (×8): qty 1

## 2020-02-25 MED ORDER — BISMUTH SUBSALICYLATE 262 MG/15ML PO SUSP
30.0000 mL | Freq: Four times a day (QID) | ORAL | Status: DC | PRN
  Filled 2020-02-25: qty 236

## 2020-02-25 MED ORDER — SODIUM CHLORIDE 0.9% FLUSH
3.0000 mL | Freq: Two times a day (BID) | INTRAVENOUS | Status: DC
  Administered 2020-02-25 – 2020-03-04 (×15): 3 mL via INTRAVENOUS

## 2020-02-25 MED ORDER — AMLODIPINE BESYLATE 5 MG PO TABS
5.0000 mg | ORAL_TABLET | Freq: Every day | ORAL | Status: DC
  Administered 2020-02-26 – 2020-03-04 (×7): 5 mg via ORAL
  Filled 2020-02-25 (×7): qty 1

## 2020-02-25 MED ORDER — ACETAMINOPHEN 325 MG PO TABS
650.0000 mg | ORAL_TABLET | Freq: Four times a day (QID) | ORAL | Status: DC | PRN
  Administered 2020-02-29 – 2020-03-03 (×4): 650 mg via ORAL
  Filled 2020-02-25 (×4): qty 2

## 2020-02-25 MED ORDER — MORPHINE SULFATE (PF) 2 MG/ML IV SOLN: 2.0000 mg | INTRAVENOUS | Status: DC | PRN

## 2020-02-25 MED ORDER — CEPHALEXIN 500 MG PO CAPS
500.0000 mg | ORAL_CAPSULE | Freq: Three times a day (TID) | ORAL | Status: DC
  Administered 2020-02-25 – 2020-03-04 (×23): 500 mg via ORAL
  Filled 2020-02-25 (×24): qty 1

## 2020-02-25 MED ORDER — INSULIN ASPART 100 UNIT/ML ~~LOC~~ SOLN
0.0000 [IU] | Freq: Three times a day (TID) | SUBCUTANEOUS | Status: DC
  Administered 2020-02-26 (×3): 1 [IU] via SUBCUTANEOUS
  Administered 2020-02-27: 2 [IU] via SUBCUTANEOUS
  Administered 2020-02-27 – 2020-02-29 (×4): 1 [IU] via SUBCUTANEOUS
  Administered 2020-03-01: 2 [IU] via SUBCUTANEOUS
  Administered 2020-03-01: 5 [IU] via SUBCUTANEOUS
  Administered 2020-03-01: 2 [IU] via SUBCUTANEOUS
  Administered 2020-03-02: 1 [IU] via SUBCUTANEOUS
  Administered 2020-03-02: 2 [IU] via SUBCUTANEOUS
  Administered 2020-03-03: 1 [IU] via SUBCUTANEOUS

## 2020-02-25 MED ORDER — HEPARIN SODIUM (PORCINE) 5000 UNIT/ML IJ SOLN
5000.0000 [IU] | Freq: Three times a day (TID) | INTRAMUSCULAR | Status: DC
  Administered 2020-02-25 – 2020-03-03 (×19): 5000 [IU] via SUBCUTANEOUS
  Filled 2020-02-25 (×19): qty 1

## 2020-02-25 MED ORDER — CARVEDILOL 25 MG PO TABS
25.0000 mg | ORAL_TABLET | Freq: Two times a day (BID) | ORAL | Status: DC
  Administered 2020-02-25 – 2020-03-04 (×15): 25 mg via ORAL
  Filled 2020-02-25 (×16): qty 1

## 2020-02-25 MED ORDER — ATORVASTATIN CALCIUM 80 MG PO TABS
80.0000 mg | ORAL_TABLET | Freq: Every day | ORAL | Status: DC
  Administered 2020-02-26 – 2020-03-03 (×7): 80 mg via ORAL
  Filled 2020-02-25 (×8): qty 1

## 2020-02-25 MED ORDER — OXYCODONE HCL 5 MG PO TABS
5.0000 mg | ORAL_TABLET | ORAL | Status: DC | PRN
  Administered 2020-02-26 – 2020-03-01 (×5): 5 mg via ORAL
  Filled 2020-02-25 (×5): qty 1

## 2020-02-25 MED ORDER — INSULIN ASPART 100 UNIT/ML ~~LOC~~ SOLN
0.0000 [IU] | Freq: Every day | SUBCUTANEOUS | Status: DC
  Administered 2020-02-29: 2 [IU] via SUBCUTANEOUS

## 2020-02-25 MED ORDER — POTASSIUM CHLORIDE CRYS ER 20 MEQ PO TBCR
40.0000 meq | EXTENDED_RELEASE_TABLET | Freq: Every day | ORAL | Status: DC
  Administered 2020-02-26: 40 meq via ORAL
  Filled 2020-02-25: qty 2

## 2020-02-25 NOTE — H&P (Addendum)
History and Physical    Bianca [name] QJJ:[id number] DOB: [birth year] DOA: 02/25/2020  PCP: Julian Hy, PA-C  Patient coming from: Home  Chief Complaint: Leg pain  HPI: Bianca [name] is a 73 y.o. female with medical history significant of CAD s/p CABG 02/08/20, HTN, DM2, HLD who presents for wound delitescence from her right leg vein harvest.  Daughter in room with the patient assists with the history.  Patient did well with surgery, returned home for home health PT.   Patient and daughter note that the incision opened 5-6 days ago.  Since that time they have noticed more swelling of the right leg, more pain in the leg and she has had decreased exercise tolerance.  She also has had diarrhea since given a stool softener after the surgery.  She did restart her metformin at full dose after the surgery.  Her daughter reports 3-4 stools per day, soft to watery.  Sometimes she sees undigested food in the stool.  It is worse after a metformin dose.  Started before taking keflex for wound dehiscence.  Further they note continued confusion since the hospital and needing reminders frequently.  Finally, noted to have white discharge from the distal part of the chest surgical wound.    ED Course: Patient was a direct admission.  Labs pending.   Review of Systems: As per HPI otherwise all other systems reviewed and are negative.   Past Medical History:  Diagnosis Date  . CAD (coronary artery disease) 01/31/2020   2 v dz  . Diabetes mellitus without complication ([place])   . Dyspnea   . Hypertension   . Hypertensive urgency 01/06/2020  . NSTEMI (non-ST elevated myocardial infarction) ([place]) 01/06/2020   in setting of HTN urgency and pulm edema    Past Surgical History:  Procedure Laterality Date  . CORONARY ARTERY BYPASS GRAFT N/A 02/08/2020   Procedure: CORONARY ARTERY BYPASS GRAFTING (CABG) TIMES FOUR USING LEFT INTERNAL MAMMARY ARTERY AND RIGHT SAPHENOUS LEG VEIN HARVESTED ENDOSCOPICALLY;   Surgeon: Lajuana Matte, MD;  Location: [facility];  Service: Open Heart Surgery;  Laterality: N/A;  . LEFT HEART CATH AND CORONARY ANGIOGRAPHY N/A 01/31/2020   Procedure: LEFT HEART CATH AND CORONARY ANGIOGRAPHY;  Surgeon: Lorretta Harp, MD;  Location: [place] [facility];  Service: Cardiovascular;  Laterality: N/A;  . TEE WITHOUT CARDIOVERSION N/A 02/08/2020   Procedure: TRANSESOPHAGEAL ECHOCARDIOGRAM (TEE);  Surgeon: Lajuana Matte, MD;  Location: [facility];  Service: Open Heart Surgery;  Laterality: N/A;    Social History  reports that she quit smoking about 8 weeks ago. Her smoking use included cigarettes. She smoked 0.50 packs per day. She has never used smokeless tobacco. She reports that she does not drink alcohol or use drugs.  Allergies  Allergen Reactions  . Tramadol Nausea And Vomiting    Family History  Problem Relation Age of Onset  . Diabetes Mother   . Diabetes Sister   . Diabetes Brother   . Heart disease Brother     Prior to Admission medications   Medication Sig Start Date End Date Taking? Authorizing Provider  amLODipine (NORVASC) 5 MG tablet Take 5 mg by mouth daily.    [provider]  aspirin EC 325 MG EC tablet Take 1 tablet (325 mg total) by mouth daily. 02/16/20   Gold, Wayne E, PA-C  atorvastatin (LIPITOR) 80 MG tablet Take 1 tablet (80 mg total) by mouth daily. 02/01/20   Leanor Kail, PA  bismuth subsalicylate (PEPTO  BISMOL) 262 MG/15ML suspension Take 30 mLs by mouth every 6 (six) hours as needed for indigestion.    [provider]  Blood Pressure Monitoring (ADULT BLOOD PRESSURE CUFF LG) KIT Use to check blood pressure twice a day, keep a running log of your blood pressures 01/31/15   Linton Flemings, MD  calcium carbonate (TUMS - DOSED IN MG ELEMENTAL CALCIUM) 500 MG chewable tablet Chew 1 tablet by mouth daily as needed for indigestion or heartburn.    [provider]  carvedilol (COREG) 25 MG tablet Take 1 tablet (25 mg  total) by mouth 2 (two) times daily with a meal. 02/01/20   Bhagat, Bhavinkumar, PA  cephALEXin (KEFLEX) 500 MG capsule Take 1 capsule (500 mg total) by mouth 3 (three) times daily. 02/21/20   Lightfoot, Lucile Crater, MD  furosemide (LASIX) 40 MG tablet Take 1 tablet (40 mg total) by mouth daily. 02/21/20   Lajuana Matte, MD  glucose blood (CONTOUR NEXT TEST) test strip Use as instructed 02/24/17   McVey, Gelene Mink, PA-C  glucose monitoring kit (FREESTYLE) monitoring kit 1 each by Does not apply route 2 (two) times daily. 01/18/14   Charlann Lange, PA-C  losartan (COZAAR) 50 MG tablet Take 1 tablet (50 mg total) by mouth daily. 02/16/20   Gold, Wilder Glade, PA-C  metFORMIN (GLUCOPHAGE) 1000 MG tablet TAKE 1 TABLET(1000 MG) BY MOUTH TWICE DAILY Patient taking differently: Take 1,000 mg by mouth 2 (two) times daily with a meal.  08/27/19   Forrest Moron, MD  MICROLET LANCETS MISC USE TO TEST BLOOD SUGAR ONCE DAILY 02/25/17   McVey, Gelene Mink, PA-C  potassium chloride SA (KLOR-CON) 20 MEQ tablet Take 2 tablets (40 mEq total) by mouth daily for 7 days. 02/21/20 02/28/20  Lajuana Matte, MD    Physical Exam: Vitals:   02/25/20 1405  BP: (!) 114/57  Pulse: 79  Resp: 15  Temp: 98 F (36.7 C)  TempSrc: Oral  SpO2: 99%  Weight: 71.7 kg  Height: 5' 6" (1.676 m)    Constitutional: NAD, calm, comfortable Eyes:  lids and conjunctivae normal ENMT: Mucous membranes are moist. Neck: normal, supple Respiratory: Clear, Easy work of breathing, no wheezing Cardiovascular: RR, NR, no murmur.  She has 1+ pitting edema on the right lower extremity to knee.  Trace edema on LLE Abdomen: No TTP, +BS, ND Musculoskeletal: Mild clubbing of fingers, no contractures.  Skin: She has an open wound on the right medial upper calf, opening of previous surgical site for vein graft.  Serous drainage, no obvious pus.  Distal surgical site is clean/dry.  Neurologic: Speaking clearly, strength intact.  ROM  of lower legs inhibited by pain.  Psychiatric: Normal judgment and insight. Fatigued.     Labs on Admission: Labs pending at the time of note    Assessment/Plan Wound dehiscence Leg swelling, unilateral - Pain from leg wound noted, also swelling - Does not appear to be acutely infected, certainly draining however,  - Continue Keflex - Dr. Kipp Brood with [facility] to take over care tomorrow of this issue - Right lower extremity ultrasound given recent surgery and decreased ambulation in last week.   - PT/OT to be ordered after any surgery needs completed - NPO at midnight.  - Pain control with oxycodone, IV morphine for severe pain only.   CAD (coronary artery disease) S/P CABG x 4 - Continue blood pressure control, statin - She has no chest pain at this time - Continue telemetry  Diarrhea -  The timing does not appear to be related to antibiotic use - She was off metformin for ~ 2 weeks, possibly related to restarting metformin at high dose - Hold metformin, monitor for improvement - Monitor for abdominal pain, fever, other signs of infectious diarrhea - Consider loperamide if no signs of infection  Type 2 diabetes mellitus - Hold metformin - SSI    Chronic diastolic CHF (congestive heart failure) - Unilateral lower extremity swelling, no obvious signs of acute process - BNP not checked - Continue lasix and KCL    Essential hypertension - Continue home BP medications, monitor for hypotension    Hyperlipidemia - Continue statin    Hypokalemia - Continue home KCl - Check BMET     DVT prophylaxis: Heparin SQ  Code Status:   Full  Family Communication:  Daughter Joletta at bedside Disposition Plan:   Patient is from:  Home  Anticipated DC to:  Home  Anticipated DC date:  5/19 - 5/20  Anticipated DC barriers: Possible need for surgery, debility  Consults called:  Dr. Melodie Bouillon [facility]  Admission status:  Telemetry, INP  Severity of Illness: The appropriate  patient status for this patient is INPATIENT. Inpatient status is judged to be reasonable and necessary in order to provide the required intensity of service to ensure the patient's safety. The patient's presenting symptoms, physical exam findings, and initial radiographic and laboratory data in the context of their chronic comorbidities is felt to place them at high risk for further clinical deterioration. Furthermore, it is not anticipated that the patient will be medically stable for discharge from the hospital within 2 midnights of admission. The following factors support the patient status of inpatient.   " The patient's presenting symptoms include pain, drainage. " The worrisome physical exam findings include Open wound, diarrhea. " The initial radiographic and laboratory data are worrisome because of Labs pending. " The chronic co-morbidities include DM2, CHF, recent CABG.   * I certify that at the point of admission it is my clinical judgment that the patient will require inpatient hospital care spanning beyond 2 midnights from the point of admission due to high intensity of service, high risk for further deterioration and high frequency of surveillance required.Gilles Chiquito MD [facility]  How to contact the [facility] Attending or Consulting provider [place] or covering provider during after hours [place], for this patient?   1. Check the care team in [facility] and look for a) attending/consulting [facility] provider listed and b) the [facility] team listed 2. Log into [email or web address] and use [facility]'s universal password to access. If you do not have the password, please contact the hospital operator. 3. Locate the [facility] provider you are looking for under [facility] and page to a number that you can be directly reached. 4. If you still have difficulty reaching the provider, please page the [facility] (Director on Call) for the Hospitalists listed on amion for assistance.  02/25/2020, 3:06 PM

## 2020-02-25 NOTE — Progress Notes (Deleted)
Cardiology Clinic Note   Patient Name: Bianca [name] Date of Encounter: 02/25/2020  Primary Care Provider:  Julian Hy, PA-C Primary Cardiologist:  Quay Burow, MD  Patient Profile    Bianca [name]. Bianca [name] 74 year old female presents today for follow-up of her  Past Medical History    Past Medical History:  Diagnosis Date  . CAD (coronary artery disease) 01/31/2020   2 v dz  . Diabetes mellitus without complication ([place])   . Dyspnea   . Hypertension   . Hypertensive urgency 01/06/2020  . NSTEMI (non-ST elevated myocardial infarction) ([place]) 01/06/2020   in setting of HTN urgency and pulm edema   Past Surgical History:  Procedure Laterality Date  . CORONARY ARTERY BYPASS GRAFT N/A 02/08/2020   Procedure: CORONARY ARTERY BYPASS GRAFTING (CABG) TIMES FOUR USING LEFT INTERNAL MAMMARY ARTERY AND RIGHT SAPHENOUS LEG VEIN HARVESTED ENDOSCOPICALLY;  Surgeon: Lajuana Matte, MD;  Location: [place];  Service: Open Heart Surgery;  Laterality: N/A;  . LEFT HEART CATH AND CORONARY ANGIOGRAPHY N/A 01/31/2020   Procedure: LEFT HEART CATH AND CORONARY ANGIOGRAPHY;  Surgeon: Lorretta Harp, MD;  Location: [place] [facility];  Service: Cardiovascular;  Laterality: N/A;  . NO PAST SURGERIES    . TEE WITHOUT CARDIOVERSION N/A 02/08/2020   Procedure: TRANSESOPHAGEAL ECHOCARDIOGRAM (TEE);  Surgeon: Lajuana Matte, MD;  Location: [facility];  Service: Open Heart Surgery;  Laterality: N/A;    Allergies  Allergies  Allergen Reactions  . Tramadol Nausea And Vomiting    History of Present Illness    ***  Home Medications    Prior to Admission medications   Medication Sig Start Date End Date Taking? Authorizing Provider  amLODipine (NORVASC) 5 MG tablet Take 5 mg by mouth daily.    [provider]  aspirin EC 325 MG EC tablet Take 1 tablet (325 mg total) by mouth daily. 02/16/20   Gold, Wayne E, PA-C  atorvastatin (LIPITOR) 80 MG tablet Take 1 tablet (80 mg total)  by mouth daily. 02/01/20   Bhagat, Crista Luria, PA  bismuth subsalicylate (PEPTO BISMOL) 262 MG/15ML suspension Take 30 mLs by mouth every 6 (six) hours as needed for indigestion.    [provider]  Blood Pressure Monitoring (ADULT BLOOD PRESSURE CUFF LG) KIT Use to check blood pressure twice a day, keep a running log of your blood pressures 01/31/15   Linton Flemings, MD  calcium carbonate (TUMS - DOSED IN MG ELEMENTAL CALCIUM) 500 MG chewable tablet Chew 1 tablet by mouth daily as needed for indigestion or heartburn.    [provider]  carvedilol (COREG) 25 MG tablet Take 1 tablet (25 mg total) by mouth 2 (two) times daily with a meal. 02/01/20   Bhagat, Bhavinkumar, PA  cephALEXin (KEFLEX) 500 MG capsule Take 1 capsule (500 mg total) by mouth 3 (three) times daily. 02/21/20   Lightfoot, Lucile Crater, MD  furosemide (LASIX) 40 MG tablet Take 1 tablet (40 mg total) by mouth daily. 02/21/20   Lajuana Matte, MD  glucose blood (CONTOUR NEXT TEST) test strip Use as instructed 02/24/17   McVey, Gelene Mink, PA-C  glucose monitoring kit (FREESTYLE) monitoring kit 1 each by Does not apply route 2 (two) times daily. 01/18/14   Charlann Lange, PA-C  losartan (COZAAR) 50 MG tablet Take 1 tablet (50 mg total) by mouth daily. 02/16/20   Gold, Wayne E, PA-C  metFORMIN (GLUCOPHAGE) 1000 MG tablet TAKE 1 TABLET(1000 MG) BY MOUTH TWICE DAILY Patient taking differently: Take  1,000 mg by mouth 2 (two) times daily with a meal.  08/27/19   Forrest Moron, MD  MICROLET LANCETS MISC USE TO TEST BLOOD SUGAR ONCE DAILY 02/25/17   McVey, Gelene Mink, PA-C  potassium chloride SA (KLOR-CON) 20 MEQ tablet Take 2 tablets (40 mEq total) by mouth daily for 7 days. 02/21/20 02/28/20  Lajuana Matte, MD    Family History    Family History  Problem Relation Age of Onset  . Diabetes Mother   . Diabetes Sister   . Diabetes Brother   . Heart disease Brother    She indicated that her mother is  deceased. She indicated that her father is deceased. She indicated that her sister is alive. She indicated that her brother is alive.  Social History    Social History   Socioeconomic History  . Marital status: Divorced    Spouse name: Not on file  . Number of children: Not on file  . Years of education: Not on file  . Highest education level: Not on file  Occupational History  . Not on file  Tobacco Use  . Smoking status: Former Smoker    Packs/day: 0.50    Types: Cigarettes  . Smokeless tobacco: Never Used  Substance and Sexual Activity  . Alcohol use: No  . Drug use: No  . Sexual activity: Not on file  Other Topics Concern  . Not on file  Social History Narrative  . Not on file   Social Determinants of Health   Financial Resource Strain:   . Difficulty of Paying Living Expenses:   Food Insecurity:   . Worried About Charity fundraiser in the Last Year:   . Arboriculturist in the Last Year:   Transportation Needs:   . Film/video editor (Medical):   Marland Kitchen Lack of Transportation (Non-Medical):   Physical Activity:   . Days of Exercise per Week:   . Minutes of Exercise per Session:   Stress:   . Feeling of Stress :   Social Connections:   . Frequency of Communication with Friends and Family:   . Frequency of Social Gatherings with Friends and Family:   . Attends Religious Services:   . Active Member of Clubs or Organizations:   . Attends Archivist Meetings:   Marland Kitchen Marital Status:   Intimate Partner Violence:   . Fear of Current or Ex-Partner:   . Emotionally Abused:   Marland Kitchen Physically Abused:   . Sexually Abused:      Review of Systems    General:  No chills, fever, night sweats or weight changes.  Cardiovascular:  No chest pain, dyspnea on exertion, edema, orthopnea, palpitations, paroxysmal nocturnal dyspnea. Dermatological: No rash, lesions/masses Respiratory: No cough, dyspnea Urologic: No hematuria, dysuria Abdominal:   No nausea, vomiting,  diarrhea, bright red blood per rectum, melena, or hematemesis Neurologic:  No visual changes, wkns, changes in mental status. All other systems reviewed and are otherwise negative except as noted above.  Physical Exam    VS:  There were no vitals taken for this visit. , BMI There is no height or weight on file to calculate BMI. GEN: Well nourished, well developed, in no acute distress. HEENT: normal. Neck: Supple, no JVD, carotid bruits, or masses. Cardiac: RRR, no murmurs, rubs, or gallops. No clubbing, cyanosis, edema.  Radials/DP/PT 2+ and equal bilaterally.  Respiratory:  Respirations regular and unlabored, clear to auscultation bilaterally. GI: Soft, nontender, nondistended, BS + x 4.  MS: no deformity or atrophy. Skin: warm and dry, no rash. Neuro:  Strength and sensation are intact. Psych: Normal affect.  Accessory Clinical Findings    ECG personally reviewed by me today- *** - No acute changes  Assessment & Plan   1.  ***  Jossie Ng. Dwan Hemmelgarn NP-C    02/25/2020, 7:28 AM [place] [address] Office [phone] Fax [phone]

## 2020-02-25 NOTE — Progress Notes (Signed)
Pt arrived to 4E-23 from home as a direct admit. Admitting MD notified. Pt given CHG bath. Tele applied, CCMD notified. Pt oriented to call bell, bed and room. Call bell within reach. VSS. Will continue to monitor.  Theophilus Kinds, RN

## 2020-02-25 NOTE — Progress Notes (Signed)
[address]       [place] [address]             [phone]        Shontell S Suriano [facility] Medical Record #[id number] Date of Birth: 1946-09-13   Primary Care: Julian Hy, PA-C Primary Cardiologist:Jonathan Gwenlyn Found, MD  Chief Complaint:   No chief complaint on file.   History of Present Illness:    Bianca [name] is a pleasant 74 year old female with past medical history significant for type 2 diabetes mellitus, hypertension, dyslipidemia, former tobacco use, and coronary artery disease.  He was admitted with non-ST elevation myocardial infarction on 01/06/2020.  She refused left heart catheterization during that admission and left the hospital AMA.  She presented to see Dr. Gwenlyn Found, cardiologist, in the office on 01/29/20 and left heart cath was again recommended.  she underwent left heart cath on 01/31/2020 was found to have severe two-vessel coronary artery disease.  Coronary bypass grafting was recommended to the patient at that time but she declined.  She was discharged from the hospital but returned to the ED a few days later with unstable angina pectoris.  She was seen in consultation by Dr. Kipp Brood and ultimately underwent four-vessel coronary bypass grafting by him on 02/08/2020. Her postoperative course was uneventful except for some mild hypertension and renal insufficiency.  Her creatinine peaked at 1.8 and was trending toward baseline at 1.2 by the time of discharge.  We arranged for home health physical therapy following discharge.  She was seen in our office 5 days ago and at that time had some mild separation of her right lower extremity EVH incision.  She was started on oral Keflex.  Since that visit, her daughter reports that she has had increasing drainage and further separation of the incision.  She also has more swelling and more pain in her right lower extremity that is making it difficult to progress with her ambulation.  Ms. Koppelman's daughter  reports that she is requiring dressing changes 5 or 6 times daily due to saturation of the gauze.  The family denies her having any fever at home.  Her appetite has been trailing off over the past few days but was better right after discharge.  Current Activity/ Functional Status:   Zubrod Score: At the time of surgery this patient's most appropriate activity status/level should be described as: '[]'$     0    Normal activity, no symptoms '[]'$     1    Restricted in physical strenuous activity but ambulatory, able to do out light work '[]'$     2    Ambulatory and capable of self care, unable to do work activities, up and about                 more than 50%  Of the time                            '[x]'$     3    Only limited self care, in bed greater than 50% of waking hours '[]'$     4    Completely disabled, no self care, confined to bed or chair '[]'$     5    Moribund  Past Medical History:  Diagnosis Date  . CAD (coronary artery disease) 01/31/2020   2 v dz  . Diabetes mellitus without complication ([facility])   . Dyspnea   . Hypertension   .  Hypertensive urgency 01/06/2020  . NSTEMI (non-ST elevated myocardial infarction) ([facility]) 01/06/2020   in setting of HTN urgency and pulm edema    Past Surgical History:  Procedure Laterality Date  . CORONARY ARTERY BYPASS GRAFT N/A 02/08/2020   Procedure: CORONARY ARTERY BYPASS GRAFTING (CABG) TIMES FOUR USING LEFT INTERNAL MAMMARY ARTERY AND RIGHT SAPHENOUS LEG VEIN HARVESTED ENDOSCOPICALLY;  Surgeon: Lajuana Matte, MD;  Location: [facility];  Service: Open Heart Surgery;  Laterality: N/A;  . LEFT HEART CATH AND CORONARY ANGIOGRAPHY N/A 01/31/2020   Procedure: LEFT HEART CATH AND CORONARY ANGIOGRAPHY;  Surgeon: Lorretta Harp, MD;  Location: [facility];  Service: Cardiovascular;  Laterality: N/A;  . TEE WITHOUT CARDIOVERSION N/A 02/08/2020   Procedure: TRANSESOPHAGEAL ECHOCARDIOGRAM (TEE);  Surgeon: Lajuana Matte, MD;  Location: [facility];  Service: Open  Heart Surgery;  Laterality: N/A;    Social History   Tobacco Use  Smoking Status Former Smoker  . Packs/day: 0.50  . Types: Cigarettes  . Quit date: 12/31/2019  . Years since quitting: 0.1  Smokeless Tobacco Never Used    Social History   Substance and Sexual Activity  Alcohol Use No     Allergies  Allergen Reactions  . Tramadol Nausea And Vomiting    Current Facility-Administered Medications  Medication Dose Route Frequency Provider Last Rate Last Admin  . acetaminophen (TYLENOL) tablet 650 mg  650 mg Oral Q6H PRN Sid Falcon, MD       Or  . acetaminophen (TYLENOL) suppository 650 mg  650 mg Rectal Q6H PRN Sid Falcon, MD      . Derrill Memo ON 02/26/2020] amLODipine (NORVASC) tablet 5 mg  5 mg Oral Daily Sid Falcon, MD      . Derrill Memo ON 02/26/2020] aspirin EC tablet 325 mg  325 mg Oral Daily Gilles Chiquito B, MD      . atorvastatin (LIPITOR) tablet 80 mg  80 mg Oral q1800 Gilles Chiquito B, MD      . bismuth subsalicylate (PEPTO BISMOL) 262 MG/15ML suspension 30 mL  30 mL Oral Q6H PRN Sid Falcon, MD      . calcium carbonate (TUMS - dosed in mg elemental calcium) chewable tablet 200 mg of elemental calcium  1 tablet Oral Daily PRN Gilles Chiquito B, MD      . carvedilol (COREG) tablet 25 mg  25 mg Oral BID WC Gilles Chiquito B, MD      . cephALEXin (KEFLEX) capsule 500 mg  500 mg Oral TID Gilles Chiquito B, MD      . heparin injection 5,000 Units  5,000 Units Subcutaneous Q8H Gilles Chiquito B, MD      . insulin aspart (novoLOG) injection 0-5 Units  0-5 Units Subcutaneous QHS Gilles Chiquito B, MD      . insulin aspart (novoLOG) injection 0-9 Units  0-9 Units Subcutaneous TID WC Sid Falcon, MD      . Derrill Memo ON 02/26/2020] losartan (COZAAR) tablet 50 mg  50 mg Oral Daily Gilles Chiquito B, MD      . morphine 2 MG/ML injection 2 mg  2 mg Intravenous Q2H PRN Gilles Chiquito B, MD      . oxyCODONE (Oxy IR/ROXICODONE) immediate release tablet 5 mg  5 mg Oral Q3H PRN Sid Falcon,  MD      . Derrill Memo ON 02/26/2020] potassium chloride SA (KLOR-CON) CR tablet 40 mEq  40 mEq Oral Daily Sid Falcon, MD      .  sodium chloride flush (NS) 0.9 % injection 3 mL  3 mL Intravenous Q12H Sid Falcon, MD        Medications Prior to Admission  Medication Sig Dispense Refill Last Dose  . amLODipine (NORVASC) 5 MG tablet Take 5 mg by mouth daily.     Marland Kitchen aspirin EC 325 MG EC tablet Take 1 tablet (325 mg total) by mouth daily.     Marland Kitchen atorvastatin (LIPITOR) 80 MG tablet Take 1 tablet (80 mg total) by mouth daily. 30 tablet 6   . bismuth subsalicylate (PEPTO BISMOL) 262 MG/15ML suspension Take 30 mLs by mouth every 6 (six) hours as needed for indigestion.     . Blood Pressure Monitoring (ADULT BLOOD PRESSURE CUFF LG) KIT Use to check blood pressure twice a day, keep a running log of your blood pressures 1 each 0   . calcium carbonate (TUMS - DOSED IN MG ELEMENTAL CALCIUM) 500 MG chewable tablet Chew 1 tablet by mouth daily as needed for indigestion or heartburn.     . carvedilol (COREG) 25 MG tablet Take 1 tablet (25 mg total) by mouth 2 (two) times daily with a meal. 60 tablet 6   . cephALEXin (KEFLEX) 500 MG capsule Take 1 capsule (500 mg total) by mouth 3 (three) times daily. 21 capsule 0   . furosemide (LASIX) 40 MG tablet Take 1 tablet (40 mg total) by mouth daily. 30 tablet 0   . glucose blood (CONTOUR NEXT TEST) test strip Use as instructed 100 each 12   . glucose monitoring kit (FREESTYLE) monitoring kit 1 each by Does not apply route 2 (two) times daily. 1 each 0   . losartan (COZAAR) 50 MG tablet Take 1 tablet (50 mg total) by mouth daily. 30 tablet 1   . metFORMIN (GLUCOPHAGE) 1000 MG tablet TAKE 1 TABLET(1000 MG) BY MOUTH TWICE DAILY (Patient taking differently: Take 1,000 mg by mouth 2 (two) times daily with a meal. ) 60 tablet 0   . MICROLET LANCETS MISC USE TO TEST BLOOD SUGAR ONCE DAILY 300 each 0   . potassium chloride SA (KLOR-CON) 20 MEQ tablet Take 2 tablets (40 mEq  total) by mouth daily for 7 days. 60 tablet 0     Family History  Problem Relation Age of Onset  . Diabetes Mother   . Diabetes Sister   . Diabetes Brother   . Heart disease Brother      Review of Systems:   ROS    Cardiac Review of Systems: Y or  [    ]= no  Chest Pain [    ]  Resting SOB [   ] Exertional SOB  [  ]  Orthopnea [  ]   Pedal Edema [   ]    Palpitations [  ] Syncope  [  ]   Presyncope [   ]  General Review of Systems: [Y] = yes [  ]=no Constitional: recent weight change [  ]; anorexia [  ]; fatigue [  ]; nausea [  ]; night sweats [  ]; fever [  ]; or chills [  ]  Dental: Last Dentist visit:   Eye : blurred vision [  ]; diplopia [   ]; vision changes [  ];  Amaurosis fugax[  ]; Resp: cough [  ];  wheezing[  ];  hemoptysis[  ]; shortness of breath[  ]; paroxysmal nocturnal dyspnea[  ]; dyspnea on exertion[  ]; or orthopnea[  ];  GI:  gallstones[  ], vomiting[  ];  dysphagia[  ]; melena[  ];  hematochezia [  ]; heartburn[  ];   Hx of  Colonoscopy[  ]; GU: kidney stones [  ]; hematuria[  ];   dysuria [  ];  nocturia[  ];  history of     obstruction [  ]; urinary frequency [  ]             Skin: rash, swelling[  ];, hair loss[  ];  peripheral edema[  ];  or itching[  ]; Musculosketetal: myalgias[  ];  joint swelling[  ];  joint erythema[  ];  joint pain[  ];  back pain[  ];  Heme/Lymph: bruising[  ];  bleeding[  ];  anemia[  ];  Neuro: TIA[  ];  headaches[  ];  stroke[  ];  vertigo[  ];  seizures[  ];   paresthesias[  ];  difficulty walking[  ];  Psych:depression[  ]; anxiety[  ];  Endocrine: diabetes[x  ];  thyroid dysfunction[  ];               Physical Exam: BP (!) 114/57 (BP Location: Right Arm)   Pulse 79   Temp 98 F (36.7 C) (Oral)   Resp 15   Ht '5\' 6"'$  (1.676 m)   Wt 71.7 kg   SpO2 99%   BMI 25.52 kg/m    General appearance: alert, cooperative and no distress Head: Normocephalic, without  obvious abnormality, atraumatic Neck: no adenopathy, no carotid bruit, no JVD, supple, symmetrical, trachea midline and thyroid not enlarged, symmetric, no tenderness/mass/nodules Lymph nodes: Cervical, supraclavicular, and axillary nodes normal. Resp: Breath sounds are clear anterior and posterior Cardio: The heart is in a regular rate and rhythm.  There is no murmur. GI: Soft, nontender.  Active bowel sounds.  (Ate food tray was delivered to the room during the exam and she was anxious to start eating) Extremities: All are warm and well-perfused.  No obvious deformity.  There is mild swelling of the right lower extremity.  The incision just below the the right knee overlying the saphenous harvest tunnel has separated completely.  The wound measures about 4 cm x 5 cm.  There is some devitalized tissue at the base of the wound.  There is copious strong colored thin fluid draining from the wound.  There is some mild expected induration along the saphenous harvest tunnel.  There is expected bruising but no tenderness and no erythema.  I am not able to palpate dorsalis pedis or posterior tibial pulses in either foot but both feet are warm and have brisk capillary refill. Neurologic: Ms. Greb is awake and alert.  She is slow to respond on her own with her daughter in the room who answers most questions on her behalf.  Ms. Madrigal is cooperative and gives appropriate answers when questions are directed to her.  She has no gross motor or sensory deficits.  Diagnostic Studies & Laboratory data:     Recent Radiology Findings:   No results found.   Recent Lab Findings: Lab Results  Component Value Date  WBC 8.2 02/25/2020   HGB 8.1 (L) 02/25/2020   HCT 26.7 (L) 02/25/2020   PLT 341 02/25/2020   GLUCOSE 95 02/15/2020   CHOL 230 (H) 01/06/2020   TRIG 103 01/06/2020   HDL 57 01/06/2020   LDLCALC 152 (H) 01/06/2020   ALT 7 02/24/2017   AST 12 02/24/2017   NA 142 02/15/2020   K 3.4 (L) 02/15/2020    CL 108 02/15/2020   CREATININE 0.95 02/15/2020   BUN 17 02/15/2020   CO2 27 02/15/2020   TSH 1.037 01/06/2020   INR 1.2 02/25/2020   HGBA1C 8.1 (H) 02/07/2020      Assessment / Plan:    74 year old female well-known to our practice with past history of type 2 diabetes mellitus, hypertension, dyslipidemia, coronary artery disease status post CABG x4 on 02/08/2020.  She presents with dehiscence of the right lower extremity EVH incision resulting in open, continuously draining wound VAC bounded by some devitalized tissue.  The lower portion of the sternotomy incision is also separated much more slightly with some clear drainage.  Sternum is stable.  Laboratory evaluation is unremarkable.  White blood count is 8200 hematocrit has improved to 26.7% from 24% since discharge.  Electrolytes are within normal limits.  Her creatinine is near baseline at 1.26 Agree with broad-spectrum antibiotics and right lower extremity venous duplex scan to rule out DVT.  Her lower extremity screening arterial ultrasound just prior to her coronary bypass grafting suggested only mild bilateral peripheral arterial disease.  We will continue with wound care with dry dressing changes as needed.  The RLE wound will need to be debrided in the OR.  We will try to schedule for tomorrow.  The wound would probably be best managed in the long term with negative pressure therapy. The sternal wound does not appear to require surgical intervention at this time but will need to be kept clean and dry and watched closely.  We will plan to obtain wound cultures in the operating room and will tailor antibiotic therapy accordingly. Will get a CXR to check alignment of the sternal wires and to r/o effusions.  We appreciate hospital admission and medical management by the medicine service.  Will keep NPO after midnight for anticipated debridement in the OR tomorrow.      I  spent 25 minutes counseling the patient face to face.   Antony Odea, PA-C 02/25/2020 3:58 PM

## 2020-02-25 NOTE — Progress Notes (Signed)
Right lower extremity venous duplex completed. Refer to "CV Proc" under chart review to view preliminary results.  02/25/2020 5:37 PM Eula Fried., MHA, RVT, RDCS, RDMS

## 2020-02-25 NOTE — Telephone Encounter (Addendum)
Pt's PT w/ [facility] calls to report that pt has white drainage from lower part of sternal wound, also that her R leg wound has been draining a significant amount of serosanguinous drainage, to the point where family is having to change the dressing at least five times per day. Also reporting that pt's pain to R leg is not controlled w/ OTC Tylenol and Advil, and this is impacting her ability to walk/participate in PT. TC to pt's daughter, who verifies the above info. Photos of leg and sternal wounds sent per request and later observed by Dr. Cliffton Asters. Informed by supervisor, R. Shon Baton, RN that Dr. Cliffton Asters wants pt to be admitted to the hospital for wound care. Informed pt and daughter.

## 2020-02-26 ENCOUNTER — Ambulatory Visit: Payer: Medicare Other | Admitting: General Practice

## 2020-02-26 DIAGNOSIS — E876 Hypokalemia: Secondary | ICD-10-CM

## 2020-02-26 LAB — BASIC METABOLIC PANEL
Anion gap: 10 (ref 5–15)
BUN: 18 mg/dL (ref 8–23)
CO2: 24 mmol/L (ref 22–32)
Calcium: 8.6 mg/dL — ABNORMAL LOW (ref 8.9–10.3)
Chloride: 105 mmol/L (ref 98–111)
Creatinine, Ser: 1.11 mg/dL — ABNORMAL HIGH (ref 0.44–1.00)
GFR calc Af Amer: 56 mL/min — ABNORMAL LOW (ref >60)
GFR calc non Af Amer: 49 mL/min — ABNORMAL LOW (ref >60)
Glucose, Bld: 143 mg/dL — ABNORMAL HIGH (ref 70–99)
Potassium: 4.6 mmol/L (ref 3.5–5.1)
Sodium: 139 mmol/L (ref 135–145)

## 2020-02-26 LAB — HEMOGLOBIN A1C
Hgb A1c MFr Bld: 7.1 % — ABNORMAL HIGH (ref 4.8–5.6)
Mean Plasma Glucose: 157 mg/dL

## 2020-02-26 LAB — CBC
HCT: 26.8 % — ABNORMAL LOW (ref 36.0–46.0)
Hemoglobin: 8.2 g/dL — ABNORMAL LOW (ref 12.0–15.0)
MCH: 27 pg (ref 26.0–34.0)
MCHC: 30.6 g/dL (ref 30.0–36.0)
MCV: 88.2 fL (ref 80.0–100.0)
Platelets: 333 10*3/uL (ref 150–400)
RBC: 3.04 MIL/uL — ABNORMAL LOW (ref 3.87–5.11)
RDW: 15.7 % — ABNORMAL HIGH (ref 11.5–15.5)
WBC: 7.3 10*3/uL (ref 4.0–10.5)
nRBC: 0 % (ref 0.0–0.2)

## 2020-02-26 LAB — GLUCOSE, CAPILLARY
Glucose-Capillary: 128 mg/dL — ABNORMAL HIGH (ref 70–99)
Glucose-Capillary: 126 mg/dL — ABNORMAL HIGH (ref 70–99)
Glucose-Capillary: 131 mg/dL — ABNORMAL HIGH (ref 70–99)
Glucose-Capillary: 172 mg/dL — ABNORMAL HIGH (ref 70–99)

## 2020-02-26 NOTE — Consult Note (Addendum)
WOC Nurse Consult Note: Consult requested for right leg incision prior to CT team involvement.  They are now following for assessment and plan of care. Their progress notes indicate: "We will continue with wound care with dry dressing changes as needed.  The RLE wound will need to be debrided in the OR.  We will try to schedule for tomorrow."  Please refer to their team for further questions regarding plan of care.and re-consult if further assistance is needed.    Post-note 0920: CT team plans to possibly take pt to the OR today, according to progress notes. Requested to assess leg wound on 5/19 and apply Vac at that time if appropriate.  Cammie Mcgee MSN, RN, CWOCN, [place], CNS [phone]

## 2020-02-26 NOTE — Discharge Summary (Signed)
Physician Discharge Summary  Patient ID: FREDERICA CHRESTMAN MRN: [id number] DOB/AGE: [birth year] 74 y.o.  Admit date: 02/25/2020 Discharge date: 03/04/2020  Admission Diagnoses: Right leg EVH site wound dehiscence/infection  Discharge Diagnoses:  Active Problems:   Type 2 diabetes mellitus (HCC)   CHF (congestive heart failure) (HCC)   Essential hypertension   Hyperlipidemia   CAD (coronary artery disease)   Hypokalemia   S/P CABG x 4   Wound dehiscence   Malnutrition of moderate degree  Patient Active Problem List   Diagnosis Date Noted  . Malnutrition of moderate degree 02/29/2020  . Wound dehiscence 02/25/2020  . S/P CABG x 4 02/08/2020  . Coronary artery disease 02/08/2020  . Chest pain 02/03/2020  . Unstable angina ([facility]) 02/03/2020  . Hypokalemia 02/03/2020  . CAD (coronary artery disease) 01/31/2020  . Pain due to onychomycosis of toenails of both feet 01/30/2020  . Chest pain of uncertain etiology 01/77/9390  . Non-STEMI (non-ST elevated myocardial infarction) ([facility]) 01/29/2020  . Essential hypertension 01/29/2020  . Hyperlipidemia 01/29/2020  . Acute diastolic CHF (congestive heart failure) ([facility]) 01/06/2020  . CHF (congestive heart failure) ([facility]) 01/06/2020  . Noncompliance with medication regimen 08/17/2018  . Noncompliance with diagnostic testing 08/17/2018  . Type 2 diabetes mellitus ([facility]) 04/17/2008  . TOBACCO ABUSE 04/17/2008   History of Present Illness:   at time of re-admissionMs. Metzer is a pleasant 74 year old female with past medical history significant for type 2 diabetes mellitus, hypertension, dyslipidemia, former tobacco use, and coronary artery disease.  He was admitted with non-ST elevation myocardial infarction on 01/06/2020.  She refused left heart catheterization during that admission and left the hospital AMA.  She presented to see Dr. Gwenlyn Found, cardiologist, in the office on 01/29/20 and left heart cath was again recommended.  she underwent left heart  cath on 01/31/2020 was found to have severe two-vessel coronary artery disease.  Coronary bypass grafting was recommended to the patient at that time but she declined.  She was discharged from the hospital but returned to the ED a few days later with unstable angina pectoris.  She was seen in consultation by Dr. Kipp Brood and ultimately underwent four-vessel coronary bypass grafting by him on 02/08/2020. Her postoperative course was uneventful except for some mild hypertension and renal insufficiency.  Her creatinine peaked at 1.8 and was trending toward baseline at 1.2 by the time of discharge.  We arranged for home health physical therapy following discharge.  She was seen in our office 5 days ago and at that time had some mild separation of her right lower extremity EVH incision.  She was started on oral Keflex.  Since that visit, her daughter reports that she has had increasing drainage and further separation of the incision.  She also has more swelling and more pain in her right lower extremity that is making it difficult to progress with her ambulation.  Ms. Baldonado's daughter reports that she is requiring dressing changes 5 or 6 times daily due to saturation of the gauze.  The family denies her having any fever at home.  Her appetite has been trailing off over the past few days but was better right after discharge.   Discharged Condition: good  Hospital Course: Patient was admitted for continued management of her EVH site wound.  Additionally her sternal incision was closely monitored.  She was placed on oral Keflex and twice daily wet-to-dry dressing changes were done as well as bedside wound debridement in anticipation of VAC placement.  The [facility]  was placed on and arrangements were made for home care.  She has otherwise remained clinically quite stable.  Consults: wound care  Significant Diagnostic Studies: routine labs   Treatments: bedside wound debridement of wound and placement of  VAC  Discharge Exam: Blood pressure (!) 115/58, pulse 87, temperature 97.9 F (36.6 C), temperature source Oral, resp. rate (!) 25, height '5\' 6"'$  (1.676 m), weight 71 kg, SpO2 95 %.  General appearance: alert, cooperative and no distress Heart: regular rate and rhythm Lungs: clear to auscultation bilaterally Abdomen: benign Extremities: no edema Wound: VAC in place, no erethema  Disposition:  Discharge disposition: 01-Home or Self Care       Discharge Instructions    Discharge patient   Complete by: As directed    Discharge disposition: 01-Home or Self Care   Discharge patient date: 03/04/2020     Allergies as of 03/04/2020      Reactions   Tramadol Nausea And Vomiting      Medication List    STOP taking these medications   cephALEXin 500 MG capsule Commonly known as: KEFLEX   ibuprofen 200 MG tablet Commonly known as: ADVIL   metFORMIN 1000 MG tablet Commonly known as: GLUCOPHAGE     TAKE these medications   acetaminophen 325 MG tablet Commonly known as: TYLENOL Take 650 mg by mouth every 6 (six) hours. What changed: Another medication with the same name was added. Make sure you understand how and when to take each.   acetaminophen 325 MG tablet Commonly known as: TYLENOL Take 2 tablets (650 mg total) by mouth QID. What changed: You were already taking a medication with the same name, and this prescription was added. Make sure you understand how and when to take each.   Adult Blood Pressure Cuff Lg Kit Use to check blood pressure twice a day, keep a running log of your blood pressures   amLODipine 5 MG tablet Commonly known as: NORVASC Take 5 mg by mouth daily.   aspirin 325 MG EC tablet Take 1 tablet (325 mg total) by mouth daily.   atorvastatin 80 MG tablet Commonly known as: LIPITOR Take 1 tablet (80 mg total) by mouth daily.   bismuth subsalicylate 850 YD/74JO suspension Commonly known as: PEPTO BISMOL Take 30 mLs by mouth every 6 (six) hours  as needed for indigestion.   calcium carbonate 500 MG chewable tablet Commonly known as: TUMS - dosed in mg elemental calcium Chew 1 tablet (200 mg of elemental calcium total) by mouth daily as needed for indigestion or heartburn.   carvedilol 25 MG tablet Commonly known as: COREG Take 1 tablet (25 mg total) by mouth 2 (two) times daily with a meal.   feeding supplement (GLUCERNA SHAKE) Liqd Take 237 mLs by mouth 2 (two) times daily between meals.   ferrous [id number] C-folic acid capsule Commonly known as: TRINSICON / FOLTRIN Take 1 capsule by mouth 3 (three) times daily after meals.   furosemide 40 MG tablet Commonly known as: LASIX Take 0.5 tablets (20 mg total) by mouth daily. What changed: how much to take   glucose blood test strip Commonly known as: Contour Next Test Use as instructed   glucose monitoring kit monitoring kit 1 each by Does not apply route 2 (two) times daily.   insulin glargine 100 UNIT/ML injection Commonly known as: LANTUS Inject 0.1 mLs (10 Units total) into the skin daily. Start taking on: Mar 05, 2020   loratadine 10 MG tablet Commonly known as: CLARITIN  Take 10 mg by mouth every other day.   losartan 50 MG tablet Commonly known as: COZAAR Take 1 tablet (50 mg total) by mouth daily.   Microlet Lancets Misc USE TO TEST BLOOD SUGAR ONCE DAILY   potassium chloride SA 20 MEQ tablet Commonly known as: KLOR-CON Take 1 tablet (20 mEq total) by mouth daily. What changed: how much to take            Durable Medical Equipment  (From admission, onward)         Start     Ordered   03/04/20 0852  For home use only DME Bedside commode  Once    Question:  Patient needs a bedside commode to treat with the following condition  Answer:  Physical deconditioning   03/04/20 0851   03/04/20 0851  For home use only DME Walker rolling  Once    Question Answer Comment  Walker: With [place]   Patient needs a walker to treat with the  following condition Physical deconditioning      03/04/20 0851   03/02/20 1301  For home use only DME Walker  Once    Comments: 5" wheeled walker  Question:  Patient needs a walker to treat with the following condition  Answer:  Ambulatory dysfunction   03/02/20 1301         Follow-up Information    [place], Lucile Crater, MD Follow up.   Specialty: Cardiothoracic Surgery Why: Please see discharge paperwork for follow-up appointment with your surgeon. Contact information: [address] [place] [address] [phone]        [facility], [facility] Follow up.   Specialty: Home Health Services Why: HHRN/PT/aide resumed- (RN to start wound VAC drsg in the home on 5/26)- [facility] wound VAC has been arranged- supplies and home VAC to be delivered to room prior to discharge Contact information: [place] [address] [phone]           Signed: John Giovanni  PA-C 03/04/2020, 9:58 AM

## 2020-02-26 NOTE — Discharge Instructions (Signed)
Negative Pressure Wound Therapy Home Guide °Negative pressure wound therapy (NPWT) uses a sponge or foam-like material (dressing) placed on or inside the wound. The wound is then covered and sealed with a cover dressing that sticks to your skin (is adhesive). This keeps air out. A tube is attached to the cover dressing, and this tube connects to a small pump. The pump sucks fluid and germs from the wound. NPWT helps to increase blood flow to the wound and heal it from the inside. °What are the risks? °NPWT is usually safe to use. However, problems can occur, including: °· Skin irritation from the dressing adhesive. °· Bleeding. °· Infection. °· Dehydration. Wounds with large amounts of drainage can cause excessive fluid loss. °· Pain. °Supplies needed: °· A disposable garbage bag. °· Soap and water, or hand sanitizer. °· Wound cleanser or salt-water solution (saline). °· New sponge and cover dressing. °· Protective clothing. °· Gauze pad. °· Vinyl gloves. °· Tape. °· Skin protectant. This may be a wipe, film, or spray. °· Clean or germ-free (sterile) scissors. °· Eye protection. °How to change your dressing °Prepare to change your dressing ° °1. If told by your health care provider, take pain medicine 30 minutes before changing the dressing. °2. Wash your hands with soap and water. Dry your hands with a clean towel. If soap and water are not available, use hand sanitizer. °3. Set up a clean station for wound care. °4. Open the dressing package so that the sponge dressing remains on the inside of the package. °5. Wear gloves, protective clothing, and eye protection. °Remove old dressing ° °1. Turn off the pump and disconnect the tubing from the dressing. °2. Carefully remove the adhesive cover dressing in the direction of your hair growth. °3. Remove the sponge dressing that is inside the wound. If the sponge sticks, use a wound cleanser or saline solution to wet the sponge and help it come off more easily. °4. Throw  the old sponge and cover dressing supplies into the garbage bag. °5. Remove your gloves by grabbing the cuff and turning the glove inside out. Place the gloves in the trash immediately. °6. Wash your hands with soap and water. Dry your hands with a clean towel. If soap and water are not available, use hand sanitizer. °Clean your wound °· Wear gloves, protective clothing, and eye protection. °Follow your health care provider's instructions on how to clean your wound. You may be told to: °1. Clean the wound using a saline solution or a wound cleanser and a clean gauze pad. °2. Pat the wound dry with a gauze pad. Do not rub the wound. °3. Throw the gauze pad into the garbage bag. °4. Remove your gloves by grabbing the cuff and turning the glove inside out. Place the gloves in the trash immediately. °5. Wash your hands with soap and water. Dry your hands with a clean towel. If soap and water are not available, use hand sanitizer. °Apply new dressing °· Wear gloves, protective clothing, and eye protection. °1. If told by your health care provider, apply a skin protectant to any skin that will be exposed to adhesive. Let the skin protectant dry. °2. Cut a piece of new sponge dressing and put it on or in the wound. °3. Using clean scissors, cut a nickel-sized hole in the new cover dressing. °4. Apply the cover dressing. °5. Attach the suction tube over the hole in the cover dressing. °6. Take off your gloves. Put them in the   plastic bag with the old dressing. Tie the bag shut and throw it away. °7. Wash your hands with soap and water. Dry your hands with a clean towel. If soap and water are not available, use hand sanitizer. °8. Turn the pump back on. The sponge dressing should collapse. Do not change the settings on the machine without talking to a health care provider. °9. Replace the container in the pump that collects fluid if it is full. Replace the container per the manufacturer's instructions or at least once a  week, even if it is not full. °General tips and recommendations °If the alarm sounds: °· Stay calm. °· Do not turn off the pump or do anything with the dressing. °· Reasons the alarm may go off: °? The battery is low. Change the battery or plug the device into electrical power. °? The dressing has a leak. Find the leak and put tape over the leak. °? The fluid collection container is full. Change the fluid container. °· Call your health care provider right away if you cannot fix the problem. °· Explain to your health care provider what is happening. Follow his or her instructions. °General instructions °· Do not turn off the pump unless told to do so by your health care provider. °· Do not turn off the pump for more than 2 hours. If the pump is off for more than 2 hours, the dressing will need to be changed. °· If your health care provider says it is okay to shower: °? Do not take the pump into the shower. °? Make sure the wound dressing is protected and sealed. The wound dressing must stay dry. °· Check frequently that the machine indicates that therapy is on and that all clamps are open. °· Do not use over-the-counter medicated or antiseptic creams, sprays, liquids, or dressings unless your health care provider approves. °Contact a health care provider if: °· You have new pain. °· You develop irritation, a rash, or itching around the wound or dressing. °· You see new black or yellow tissue in your wound. °· The dressing changes are painful or cause bleeding. °· The pump has been off for more than 2 hours, and you do not know how to change the dressing. °· The pump alarm goes off, and you do not know what to do. °Get help right away if: °· You have a lot of bleeding. °· The wound breaks open. °· You have severe pain. °· You have signs of infection, such as: °? More redness, swelling, or pain. °? More fluid or blood. °? Warmth. °? Pus or a bad smell. °? Red streaks leading from the wound. °? A fever. °· You see a  sudden change in the color or texture of the drainage. °· You have signs of dehydration, such as: °? Little or no tears, urine, or sweat. °? Muscle cramps. °? Very dry mouth. °? Headache. °? Dizziness. °Summary °· Negative pressure wound therapy (NPWT) is a device that helps your wound heal. °· Set up a clean station for wound care. Your health care provider will tell you what supplies to use. °· Follow your health care provider's instructions on how to clean your wound and how to change the dressing. °· Contact a health care provider if you have new pain, an irritation, or a rash, or if the alarm goes off and you do not know what to do. °· Get help right away if you have a lot of bleeding, your wound breaks   open, or you have severe pain. Also, get help if you have signs of infection. °This information is not intended to replace advice given to you by your health care provider. Make sure you discuss any questions you have with your health care provider. °Document Revised: 01/19/2019 Document Reviewed: 12/15/2018 °Elsevier Patient Education © 2020 [employer] ° °

## 2020-02-26 NOTE — Progress Notes (Addendum)
[address]       [place] [address]             [phone]         Subjective: Some leg discomfort, o/w feels pretty well  Objective: Vital signs in last 24 hours: Temp:  [98 F (36.7 C)-98.4 F (36.9 C)] 98.4 F (36.9 C) (05/18 0804) Pulse Rate:  [76-91] 90 (05/18 0804) Cardiac Rhythm: Heart block (05/18 0340) Resp:  [15-25] 18 (05/18 0804) BP: (110-139)/(55-65) 137/65 (05/18 0804) SpO2:  [99 %-100 %] 99 % (05/18 0804) Weight:  [71 kg-71.7 kg] 71 kg (05/18 0415)  Hemodynamic parameters for last 24 hours:    Intake/Output from previous day: No intake/output data recorded. Intake/Output this shift: No intake/output data recorded.  Wound: RLE wound debrided of necrotic fatty tissue, exudate is somewhat foul smelling and lightly purulent  Lab Results: Recent Labs    02/25/20 1450 02/26/20 0257  WBC 8.2 7.3  HGB 8.1* 8.2*  HCT 26.7* 26.8*  PLT 341 333   BMET:  Recent Labs    02/25/20 1450 02/26/20 0257  NA 140 139  K 4.9 4.6  CL 106 105  CO2 24 24  GLUCOSE 107* 143*  BUN 21 18  CREATININE 1.26* 1.11*  CALCIUM 8.9 8.6*    PT/INR:  Recent Labs    02/25/20 1450  LABPROT 14.4  INR 1.2   ABG    Component Value Date/Time   PHART 7.342 (L) 02/08/2020 2029   HCO3 21.7 02/08/2020 2029   TCO2 23 02/08/2020 2029   ACIDBASEDEF 4.0 (H) 02/08/2020 2029   O2SAT 99.0 02/08/2020 2029   CBG (last 3)  Recent Labs    02/25/20 1654 02/25/20 2143 02/26/20 0635  GLUCAP 96 162* 128*    Meds Scheduled Meds: . amLODipine  5 mg Oral Daily  . aspirin  325 mg Oral Daily  . atorvastatin  80 mg Oral q1800  . carvedilol  25 mg Oral BID WC  . cephALEXin  500 mg Oral TID  . heparin  5,000 Units Subcutaneous Q8H  . insulin aspart  0-5 Units Subcutaneous QHS  . insulin aspart  0-9 Units Subcutaneous TID WC  . losartan  50 mg Oral Daily  . potassium chloride SA  40 mEq Oral Daily  . sodium chloride flush  3 mL Intravenous Q12H    Continuous Infusions: PRN Meds:.acetaminophen **OR** acetaminophen, bismuth subsalicylate, calcium carbonate, morphine injection, oxyCODONE  Xrays DG CHEST PORT 1 VIEW  Result Date: 02/25/2020 CLINICAL DATA:  Postop EXAM: PORTABLE CHEST 1 VIEW COMPARISON:  02/12/2020 FINDINGS: Changes of CABG. Interval removal of right central line. Cardiomegaly. Bibasilar atelectasis with small bilateral effusions. No pneumothorax or acute bony abnormality. IMPRESSION: Bibasilar atelectasis, small bilateral effusions. Electronically Signed   By: Rolm Baptise M.D.   On: 02/25/2020 17:25   VAS [place] LOWER EXTREMITY VENOUS (DVT)  Result Date: 02/25/2020  Lower Venous DVTStudy Indications: Edema.  Comparison Study: No prior study Performing Technologist: Maudry Mayhew MHA, RDMS, RVT, RDCS  Examination Guidelines: A complete evaluation includes B-mode imaging, spectral Doppler, color Doppler, and power Doppler as needed of all accessible portions of each vessel. Bilateral testing is considered an integral part of a complete examination. Limited examinations for reoccurring indications may be performed as noted. The reflux portion of the exam is performed with the patient in reverse Trendelenburg.  +---------+---------------+---------+-----------+----------+--------------+ RIGHT    CompressibilityPhasicitySpontaneityPropertiesThrombus Aging +---------+---------------+---------+-----------+----------+--------------+ CFV      Full  No       Yes                                 +---------+---------------+---------+-----------+----------+--------------+ SFJ      Full                                                        +---------+---------------+---------+-----------+----------+--------------+ FV Prox  Full                                                        +---------+---------------+---------+-----------+----------+--------------+ FV Mid                           Yes                                  +---------+---------------+---------+-----------+----------+--------------+ FV Distal                        Yes                                 +---------+---------------+---------+-----------+----------+--------------+ PFV      Full                                                        +---------+---------------+---------+-----------+----------+--------------+ POP                     No       Yes                                 +---------+---------------+---------+-----------+----------+--------------+ PTV      Full                    Yes                                 +---------+---------------+---------+-----------+----------+--------------+ PERO     Full                    Yes                                 +---------+---------------+---------+-----------+----------+--------------+ patient unable to tolerate some compression maneuvers secondary to pain.  +----+---------------+---------+-----------+----------+--------------+ LEFTCompressibilityPhasicitySpontaneityPropertiesThrombus Aging +----+---------------+---------+-----------+----------+--------------+ CFV Full           No       Yes                                 +----+---------------+---------+-----------+----------+--------------+  Summary: RIGHT: - There is no evidence of deep vein thrombosis in the lower extremity. However, portions of this examination were limited- see technologist comments above.  - No cystic structure found in the popliteal fossa.  LEFT: - No evidence of common femoral vein obstruction.  Pulsatile lower extremity venous flow is suggestive of possibly elevated right heart pressure.  *See table(s) above for measurements and observations. Electronically signed by Sherald Hess MD on 02/25/2020 at 5:57:33 PM.    Final     Assessment/Plan: 1 S/P Right EVH site debridement per Dr Cliffton Asters at bedside. Patient tolerated well with minor discomfort . Plan  will be for wet-dry saline BID dressing changes and then VAC placement. 2 vitals stable,sats good on RA, no fevers  sinus with PVC's 3 labs are stable, no DVT by duplex 4 can transfer to TCTS  LOS: 1 day    Rowe Clack PA-C Pager [phone] 02/26/2020   Lower extremity wound was debrided at the bedside.  There was necrotic tissue and does necrosis was debrided.  There was some healthy tissue underneath.  We will continue wet-to-dry dressing changes hopefully place a VAC tomorrow.  Lashana Spang Keane Scrape

## 2020-02-26 NOTE — Progress Notes (Signed)
PROGRESS NOTE  Bianca [name] VZD:[id number] DOB: [birth year] DOA: 02/25/2020 PCP: Julian Hy, PA-C  Brief History   Bianca [name] is a 74 y.o. female with medical history significant of CAD s/p CABG 02/08/20, HTN, DM2, HLD who presents for wound delitescence from her right leg vein harvest.  Daughter in room with the patient assists with the history.  Patient did well with surgery, returned home for home health PT.   Patient and daughter note that the incision opened 5-6 days ago.  Since that time they have noticed more swelling of the right leg, more pain in the leg and she has had decreased exercise tolerance.  She also has had diarrhea since given a stool softener after the surgery.  She did restart her metformin at full dose after the surgery.  Her daughter reports 3-4 stools per day, soft to watery.  Sometimes she sees undigested food in the stool.  It is worse after a metformin dose.  Started before taking keflex for wound dehiscence.  Further they note continued confusion since the hospital and needing reminders frequently.  Finally, noted to have white discharge from the distal part of the chest surgical wound.    [facility] were consulted to admit the patient for further evaluation and treatment. [facility] has been consulted for management of the wound.  Consultants  . TCTS  Procedures  . None  Antibiotics   Anti-infectives (From admission, onward)   Start     Dose/Rate Route Frequency Ordered Stop   02/25/20 1600  cephALEXin (KEFLEX) capsule 500 mg     500 mg Oral 3 times daily 02/25/20 1403      .  Subjective  The patient is sitting up in a chair at bedside. No new complaints.   Objective   Vitals:  Vitals:   02/26/20 1015 02/26/20 1142  BP: 136/63 110/63  Pulse:  89  Resp:  20  Temp:  98.3 F (36.8 C)  SpO2:  100%    Exam:  Constitutional:  . The patient is awake, alert, and oriented x 3. No acute  distress. . No increased work of breathing. . No wheezes, rales, or rhonchi . No tactile fremitus Cardiovascular:  . Regular rate and rhythm . No murmurs, ectopy, or gallups. . No lateral PMI. No thrills. Abdomen:  . Abdomen is soft, non-tender, non-distended . No hernias, masses, or organomegaly . Normoactive bowel sounds.  Musculoskeletal:  . No cyanosis, clubbing, or edema. Right lower extremity wound is bandaged. Skin:  . No rashes, lesions, ulcers . palpation of skin: no induration or nodules Neurologic:  . CN 2-12 intact . Sensation all 4 extremities intact Psychiatric:  . Mental status o Mood, affect appropriate o Orientation to person, place, time  . judgment and insight appear intact  I have personally reviewed the following:   Today's Data  . Vitals, CBC, BMP  Micro Data  . MRSA and staphylococcus negative PCR  Scheduled Meds: . amLODipine  5 mg Oral Daily  . aspirin  325 mg Oral Daily  . atorvastatin  80 mg Oral q1800  . carvedilol  25 mg Oral BID WC  . cephALEXin  500 mg Oral TID  . heparin  5,000 Units Subcutaneous Q8H  . insulin aspart  0-5 Units Subcutaneous QHS  . insulin aspart  0-9 Units Subcutaneous TID WC  . losartan  50 mg Oral Daily  . potassium chloride SA  40 mEq Oral Daily  . sodium chloride  flush  3 mL Intravenous Q12H   Continuous Infusions:  Active Problems:   Type 2 diabetes mellitus (HCC)   CHF (congestive heart failure) (HCC)   Essential hypertension   Hyperlipidemia   CAD (coronary artery disease)   Hypokalemia   S/P CABG x 4   Wound dehiscence   LOS: 1 day   A & P  Wound dehiscence/Unilateral Leg swelling: Wound is painful and draining. TCTS has been consulted to address the wound. It is anticipated that the patient will go for operative debridement and cultures by TCTS. There is also a small open area at the distal end of the sternal wound. The patient will receive oxycodone and morphine for severe pain only. Doppler of  lower extremities was negative for DVT.   CAD (coronary artery disease) S/P CABG x 4: The patient is receiving ASA 325 mg daily, lipitor 80mg  daily, Coreg 25 mg bid, and Cozaar 50 mg daily. The patient denies chest pain. She is being monitored on telemetry.  Diarrhea:  The timing does not appear to be related to antibiotic use. Metformin has been held. No signs of infection.   Type 2 diabetes mellitus: Glucoses will be followed with FSBS and SSI. Metformin has been held.    Chronic diastolic CHF (congestive heart failure): Unilateral lower extremity swelling, no obvious signs of acute process. Continue lasix and potassium as at home.     Essential hypertension: Continue home BP medications, monitor for hypotension    Hyperlipidemia: Continue statin    Hypokalemia: Monitor and supplement potassium as necessary.  I have seen and examined this patient myself. I have spent 34 minutes in her evaluation and care.    DVT prophylaxis:      Heparin SQ  Code Status:              Full  Family Communication:       Daughter Joletta at bedside Disposition Plan: The patient is from home, anticipate discharge to home. Barrier to discharge: Need for surgical attention to dehisced wound with cultures and stabilization.  Eames Dibiasio, DO [facility] Direct contact: see [email or web address]  7PM-7AM contact night coverage as above 02/26/2020, 1:35 PM  LOS: 1 day

## 2020-02-27 LAB — CBC WITH DIFFERENTIAL/PLATELET
Abs Immature Granulocytes: 0.07 10*3/uL (ref 0.00–0.07)
Basophils Absolute: 0 10*3/uL (ref 0.0–0.1)
Basophils Relative: 0 %
Eosinophils Absolute: 0 10*3/uL (ref 0.0–0.5)
Eosinophils Relative: 0 %
HCT: 27.1 % — ABNORMAL LOW (ref 36.0–46.0)
Hemoglobin: 8.4 g/dL — ABNORMAL LOW (ref 12.0–15.0)
Immature Granulocytes: 1 %
Lymphocytes Relative: 18 %
Lymphs Abs: 1.1 10*3/uL (ref 0.7–4.0)
MCH: 27.5 pg (ref 26.0–34.0)
MCHC: 31 g/dL (ref 30.0–36.0)
MCV: 88.9 fL (ref 80.0–100.0)
Monocytes Absolute: 0.5 10*3/uL (ref 0.1–1.0)
Monocytes Relative: 9 %
Neutro Abs: 4.3 10*3/uL (ref 1.7–7.7)
Neutrophils Relative %: 72 %
Platelets: 325 10*3/uL (ref 150–400)
RBC: 3.05 MIL/uL — ABNORMAL LOW (ref 3.87–5.11)
RDW: 15.6 % — ABNORMAL HIGH (ref 11.5–15.5)
WBC: 6 10*3/uL (ref 4.0–10.5)
nRBC: 0 % (ref 0.0–0.2)

## 2020-02-27 LAB — BASIC METABOLIC PANEL
Anion gap: 12 (ref 5–15)
BUN: 13 mg/dL (ref 8–23)
CO2: 22 mmol/L (ref 22–32)
Calcium: 8.8 mg/dL — ABNORMAL LOW (ref 8.9–10.3)
Chloride: 105 mmol/L (ref 98–111)
Creatinine, Ser: 0.95 mg/dL (ref 0.44–1.00)
GFR calc Af Amer: >60 mL/min (ref >60)
GFR calc non Af Amer: 59 mL/min — ABNORMAL LOW (ref >60)
Glucose, Bld: 161 mg/dL — ABNORMAL HIGH (ref 70–99)
Potassium: 5.1 mmol/L (ref 3.5–5.1)
Sodium: 139 mmol/L (ref 135–145)

## 2020-02-27 LAB — GLUCOSE, CAPILLARY
Glucose-Capillary: 121 mg/dL — ABNORMAL HIGH (ref 70–99)
Glucose-Capillary: 111 mg/dL — ABNORMAL HIGH (ref 70–99)
Glucose-Capillary: 167 mg/dL — ABNORMAL HIGH (ref 70–99)
Glucose-Capillary: 166 mg/dL — ABNORMAL HIGH (ref 70–99)

## 2020-02-27 MED ORDER — QUETIAPINE FUMARATE 25 MG PO TABS
25.0000 mg | ORAL_TABLET | Freq: Two times a day (BID) | ORAL | Status: DC
  Administered 2020-02-28: 25 mg via ORAL
  Filled 2020-02-27: qty 1

## 2020-02-27 MED ORDER — POTASSIUM CHLORIDE CRYS ER 20 MEQ PO TBCR
40.0000 meq | EXTENDED_RELEASE_TABLET | Freq: Every day | ORAL | Status: DC
  Administered 2020-02-28 – 2020-03-04 (×6): 40 meq via ORAL
  Filled 2020-02-27 (×6): qty 2

## 2020-02-27 NOTE — Progress Notes (Signed)
PROGRESS NOTE  Bianca [name] AOZ:[id number] DOB: January 12, 1946 DOA: 02/25/2020 PCP: Julian Hy, PA-C  Brief History   Bianca [name] is a 74 y.o. female with medical history significant of CAD s/p CABG 02/08/20, HTN, DM2, HLD who presents for wound delitescence from her right leg vein harvest.  Daughter in room with the patient assists with the history.  Patient did well with surgery, returned home for home health PT.   Patient and daughter note that the incision opened 5-6 days ago.  Since that time they have noticed more swelling of the right leg, more pain in the leg and she has had decreased exercise tolerance.  She also has had diarrhea since given a stool softener after the surgery.  She did restart her metformin at full dose after the surgery.  Her daughter reports 3-4 stools per day, soft to watery.  Sometimes she sees undigested food in the stool.  It is worse after a metformin dose.  Started before taking keflex for wound dehiscence.  Further they note continued confusion since the hospital and needing reminders frequently.  Finally, noted to have white discharge from the distal part of the chest surgical wound.    [facility] were consulted to admit the patient for further evaluation and treatment. [facility] has been consulted for management of the wound.  Consultants  . TCTS  Procedures  . None  Antibiotics   Anti-infectives (From admission, onward)   Start     Dose/Rate Route Frequency Ordered Stop   02/25/20 1600  cephALEXin (KEFLEX) capsule 500 mg     500 mg Oral 3 times daily 02/25/20 1403       Subjective  The patient is sitting up in a chair at bedside. No new complaints. Per nursing today the patient has been oppositional to all medications and wound care.  Objective   Vitals:  Vitals:   02/27/20 0833 02/27/20 1127  BP: 134/64 (!) 146/68  Pulse: 89 89  Resp: 20 18  Temp: 97.6 F (36.4 C) 98.3 F (36.8 C)  SpO2:  100% 100%    Exam:  Constitutional:  . The patient is awake, alert, and oriented x 3. No acute distress. . No increased work of breathing. . No wheezes, rales, or rhonchi . No tactile fremitus Cardiovascular:  . Regular rate and rhythm . No murmurs, ectopy, or gallups. . No lateral PMI. No thrills. Abdomen:  . Abdomen is soft, non-tender, non-distended . No hernias, masses, or organomegaly . Normoactive bowel sounds.  Musculoskeletal:  . No cyanosis, clubbing, or edema. Right lower extremity wound is bandaged. Skin:  . No rashes, lesions, ulcers . palpation of skin: no induration or nodules Neurologic:  . CN 2-12 intact . Sensation all 4 extremities intact Psychiatric:  . Mental status o Mood, affect appropriate o Orientation to person, place, time  . judgment and insight appear intact  I have personally reviewed the following:   Today's Data  . Vitals, CBC, BMP  Micro Data  . MRSA and staphylococcus negative PCR  Scheduled Meds: . amLODipine  5 mg Oral Daily  . aspirin  325 mg Oral Daily  . atorvastatin  80 mg Oral q1800  . carvedilol  25 mg Oral BID WC  . cephALEXin  500 mg Oral TID  . heparin  5,000 Units Subcutaneous Q8H  . insulin aspart  0-5 Units Subcutaneous QHS  . insulin aspart  0-9 Units Subcutaneous TID WC  . losartan  50 mg  Oral Daily  . [START ON 02/28/2020] potassium chloride SA  40 mEq Oral Daily  . QUEtiapine  25 mg Oral BID  . sodium chloride flush  3 mL Intravenous Q12H   Continuous Infusions:  Active Problems:   Type 2 diabetes mellitus (HCC)   CHF (congestive heart failure) (HCC)   Essential hypertension   Hyperlipidemia   CAD (coronary artery disease)   Hypokalemia   S/P CABG x 4   Wound dehiscence   LOS: 2 days   A & P  Wound dehiscence/Unilateral Leg swelling: Wound is painful and draining. TCTS has been consulted to address the wound. It is anticipated that the patient will go for operative debridement and cultures by TCTS.  There is also a small open area at the distal end of the sternal wound. The patient will receive oxycodone and morphine for severe pain only. Doppler of lower extremities was negative for DVT.   CAD (coronary artery disease) S/P CABG x 4: The patient is receiving ASA 325 mg daily, lipitor 80mg  daily, Coreg 25 mg bid, and Cozaar 50 mg daily. The patient denies chest pain. She is being monitored on telemetry.  Agitation: Pt has been oppositional today refusing medications and wound care. I will start her on low dose seroquel.  Diarrhea:  The timing does not appear to be related to antibiotic use. Metformin has been held. No signs of infection.   Type 2 diabetes mellitus: Glucoses will be followed with FSBS and SSI. Metformin has been held.    Chronic diastolic CHF (congestive heart failure): Unilateral lower extremity swelling, no obvious signs of acute process. Continue lasix and potassium as at home.     Essential hypertension: Continue home BP medications, monitor for hypotension    Hyperlipidemia: Continue statin    Hypokalemia: Monitor and supplement potassium as necessary.  I have seen and examined this patient myself. I have spent 32 minutes in her evaluation and care.    DVT prophylaxis:      Heparin SQ  Code Status:              Full  Family Communication:       Daughter Joletta at bedside Disposition Plan: The patient is from home, anticipate discharge to home. Barrier to discharge: Need for surgical attention to dehisced wound with cultures and stabilization.  Ava Swayze, DO [facility] Direct contact: see [email or web address]  7PM-7AM contact night coverage as above 02/27/2020, 3:10 PM  LOS: 1 day

## 2020-02-27 NOTE — Progress Notes (Signed)
Charge RN went to assist pt w/ bathroom as primary RN and NT unavailable. When I went in, she was in the process of taking her leg dressing off, stating it needed changing. It had a moderate amount of serous drainage on kerlix. I offered to chabge it for her but when I went to pack wound w/ wet gauze she swatted my hand away and stated she only wanted a dry dressing on it. I explained the necessity of a wet to dry dressing for her wound, but she still refused the recommended dressing. She only allowed me to place 1 2x2 gauze on top of wound and then wrap with small amount of kerlix. Assisted back to bed. Relayed to primary RN.  Margarito Liner, RN

## 2020-02-27 NOTE — Progress Notes (Signed)
[address]       [place]ian[name] [address]             [phone]         Subjective: Patient wouldn't allow wound care nurse to change dressing properly  Objective: Vital signs in last 24 hours: Temp:  [97.6 F (36.4 C)-98.5 F (36.9 C)] 98 F (36.7 C) (05/19 0421) Pulse Rate:  [77-90] 82 (05/19 0421) Cardiac Rhythm: Heart block (05/19 0400) Resp:  [15-20] 17 (05/19 0421) BP: (110-137)/(59-68) 123/68 (05/19 0421) SpO2:  [97 %-100 %] 97 % (05/19 0421)  Hemodynamic parameters for last 24 hours:    Intake/Output from previous day: No intake/output data recorded. Intake/Output this shift: No intake/output data recorded.  Wounds: sternal wound has more drainage from lower pole incision, probable fatty necrosis, no erethema. Sternum feels stable RLE : evaluated with wound nurse- not ready for vac( may need further debridement) . Seropurulent drainage- moderate    Lab Results: Recent Labs    02/26/20 0257 02/27/20 0231  WBC 7.3 6.0  HGB 8.2* 8.4*  HCT 26.8* 27.1*  PLT 333 325   BMET:  Recent Labs    02/26/20 0257 02/27/20 0231  NA 139 139  K 4.6 5.1  CL 105 105  CO2 24 22  GLUCOSE 143* 161*  BUN 18 13  CREATININE 1.11* 0.95  CALCIUM 8.6* 8.8*    PT/INR:  Recent Labs    02/25/20 1450  LABPROT 14.4  INR 1.2   ABG    Component Value Date/Time   PHART 7.342 (L) 02/08/2020 2029   HCO3 21.7 02/08/2020 2029   TCO2 23 02/08/2020 2029   ACIDBASEDEF 4.0 (H) 02/08/2020 2029   O2SAT 99.0 02/08/2020 2029   CBG (last 3)  Recent Labs    02/26/20 1614 02/26/20 2144 02/27/20 0628  GLUCAP 131* 172* 121*    Meds Scheduled Meds: . amLODipine  5 mg Oral Daily  . aspirin  325 mg Oral Daily  . atorvastatin  80 mg Oral q1800  . carvedilol  25 mg Oral BID WC  . cephALEXin  500 mg Oral TID  . heparin  5,000 Units Subcutaneous Q8H  . insulin aspart  0-5 Units Subcutaneous QHS  . insulin aspart  0-9 Units Subcutaneous TID WC  . losartan   50 mg Oral Daily  . [START ON 02/28/2020] potassium chloride SA  40 mEq Oral Daily  . sodium chloride flush  3 mL Intravenous Q12H   Continuous Infusions: PRN Meds:.acetaminophen **OR** acetaminophen, bismuth subsalicylate, calcium carbonate, morphine injection, oxyCODONE  Xrays DG CHEST PORT 1 VIEW  Result Date: 02/25/2020 CLINICAL DATA:  Postop EXAM: PORTABLE CHEST 1 VIEW COMPARISON:  02/12/2020 FINDINGS: Changes of CABG. Interval removal of right central line. Cardiomegaly. Bibasilar atelectasis with small bilateral effusions. No pneumothorax or acute bony abnormality. IMPRESSION: Bibasilar atelectasis, small bilateral effusions. Electronically Signed   By: Charlett Nose M.D.   On: 02/25/2020 17:25   VAS [place] LOWER EXTREMITY VENOUS (DVT)  Result Date: 02/25/2020  Lower Venous DVTStudy Indications: Edema.  Comparison Study: No prior study Performing Technologist: Gertie Fey MHA, RDMS, RVT, RDCS  Examination Guidelines: A complete evaluation includes B-mode imaging, spectral Doppler, color Doppler, and power Doppler as needed of all accessible portions of each vessel. Bilateral testing is considered an integral part of a complete examination. Limited examinations for reoccurring indications may be performed as noted. The reflux portion of the exam is performed with the patient in reverse  Trendelenburg.  +---------+---------------+---------+-----------+----------+--------------+ RIGHT    CompressibilityPhasicitySpontaneityPropertiesThrombus Aging +---------+---------------+---------+-----------+----------+--------------+ CFV      Full           No       Yes                                 +---------+---------------+---------+-----------+----------+--------------+ SFJ      Full                                                        +---------+---------------+---------+-----------+----------+--------------+ FV Prox  Full                                                         +---------+---------------+---------+-----------+----------+--------------+ FV Mid                           Yes                                 +---------+---------------+---------+-----------+----------+--------------+ FV Distal                        Yes                                 +---------+---------------+---------+-----------+----------+--------------+ PFV      Full                                                        +---------+---------------+---------+-----------+----------+--------------+ POP                     No       Yes                                 +---------+---------------+---------+-----------+----------+--------------+ PTV      Full                    Yes                                 +---------+---------------+---------+-----------+----------+--------------+ PERO     Full                    Yes                                 +---------+---------------+---------+-----------+----------+--------------+ patient unable to tolerate some compression maneuvers secondary to pain.  +----+---------------+---------+-----------+----------+--------------+ LEFTCompressibilityPhasicitySpontaneityPropertiesThrombus Aging +----+---------------+---------+-----------+----------+--------------+ CFV Full           No       Yes                                 +----+---------------+---------+-----------+----------+--------------+  Summary: RIGHT: - There is no evidence of deep vein thrombosis in the lower extremity. However, portions of this examination were limited- see technologist comments above.  - No cystic structure found in the popliteal fossa.  LEFT: - No evidence of common femoral vein obstruction.  Pulsatile lower extremity venous flow is suggestive of possibly elevated right heart pressure.  *See table(s) above for measurements and observations. Electronically signed by Monica Martinez MD on 02/25/2020 at 5:57:33 PM.    Final      Assessment/Plan:    1 afebrile, VSS , SR with Occ PVC's . afebrile 2 sats good on RA 3 unclear why she doesn't want to cooperate with dressing changes but insisted on only a dry dressing and tape. May need further debridement. Hopefully wont have to open sternal incision further. May need to consider antibiotics.     LOS: 2 days    John Giovanni PA-C Pager [phone] 02/27/2020

## 2020-02-27 NOTE — Progress Notes (Addendum)
Pt getting agitated, uncooperative with the nurse. Refused taking meds. Kept saying "i'm going home". Took off the dressing on the R leg and refused the nurse's help to changing the new dressing for the pt. Pt oriented x3, disoriented to time. Will attempt other time. Bed alarm on. PA aware.  Lawson Radar, RN   Updated: at [phone], attempted 2nd time to administered the meds with pt's daughter in the rm. Explained the benefits of meds. Pt refused again.   Lawson Radar, RN

## 2020-02-27 NOTE — Consult Note (Signed)
WOC Nurse Consult Note: Reason for Consult: Requested by CT surgical team to assess right leg wound for possible Vac placement Wound type: Right calf with full thickness wound Measurement: 3X4X1cm; 90% yellow, 10% red, mod amt yellow drainage, no odor Periwound: intact skin surrounding Dressing procedure/placement/frequency: Wound is not ready for Vac placement; literature suggests less that 20% of yellow tissue for optimal placement.  Suggest continuing moist gauze dressings to assist with autolytic debridement.  Pt will not allow me to repack dressing and only wants a dry dressing placed on top of the wound.  Explained importance of absorbing drainage and keeping the wound damp to promote healing but she refused.  Assessed wound appearance with PA at bedside; please refer to their team for further plan of care.  Please re-consult if further assistance is needed.  Thank-you,  Cammie Mcgee MSN, RN, CWOCN, [place], CNS [phone]

## 2020-02-28 LAB — GLUCOSE, CAPILLARY
Glucose-Capillary: 122 mg/dL — ABNORMAL HIGH (ref 70–99)
Glucose-Capillary: 133 mg/dL — ABNORMAL HIGH (ref 70–99)
Glucose-Capillary: 125 mg/dL — ABNORMAL HIGH (ref 70–99)
Glucose-Capillary: 108 mg/dL — ABNORMAL HIGH (ref 70–99)

## 2020-02-28 LAB — BASIC METABOLIC PANEL
Anion gap: 6 (ref 5–15)
BUN: 10 mg/dL (ref 8–23)
CO2: 26 mmol/L (ref 22–32)
Calcium: 8.6 mg/dL — ABNORMAL LOW (ref 8.9–10.3)
Chloride: 106 mmol/L (ref 98–111)
Creatinine, Ser: 0.87 mg/dL (ref 0.44–1.00)
GFR calc Af Amer: >60 mL/min (ref >60)
GFR calc non Af Amer: >60 mL/min (ref >60)
Glucose, Bld: 174 mg/dL — ABNORMAL HIGH (ref 70–99)
Potassium: 3.9 mmol/L (ref 3.5–5.1)
Sodium: 138 mmol/L (ref 135–145)

## 2020-02-28 LAB — CBC
HCT: 25.6 % — ABNORMAL LOW (ref 36.0–46.0)
Hemoglobin: 7.8 g/dL — ABNORMAL LOW (ref 12.0–15.0)
MCH: 27 pg (ref 26.0–34.0)
MCHC: 30.5 g/dL (ref 30.0–36.0)
MCV: 88.6 fL (ref 80.0–100.0)
Platelets: 333 10*3/uL (ref 150–400)
RBC: 2.89 MIL/uL — ABNORMAL LOW (ref 3.87–5.11)
RDW: 15.6 % — ABNORMAL HIGH (ref 11.5–15.5)
WBC: 6.4 10*3/uL (ref 4.0–10.5)
nRBC: 0 % (ref 0.0–0.2)

## 2020-02-28 MED ORDER — LORAZEPAM 0.5 MG PO TABS
0.5000 mg | ORAL_TABLET | Freq: Four times a day (QID) | ORAL | Status: DC | PRN
  Administered 2020-02-28: 0.5 mg via ORAL
  Filled 2020-02-28: qty 1

## 2020-02-28 NOTE — Progress Notes (Signed)
      [address]       [place] [address]             [phone]         Subjective: Feels better today, less discomfort  Objective: Vital signs in last 24 hours: Temp:  [97.6 F (36.4 C)-98.3 F (36.8 C)] 98 F (36.7 C) (05/20 0747) Pulse Rate:  [82-91] 85 (05/20 0747) Cardiac Rhythm: Normal sinus rhythm;Bundle branch block;Heart block (05/20 0245) Resp:  [13-22] 22 (05/20 0747) BP: (104-154)/(60-76) 134/73 (05/20 0747) SpO2:  [99 %-100 %] 99 % (05/20 0747)  Hemodynamic parameters for last 24 hours:    Intake/Output from previous day: 05/19 0701 - 05/20 0700 In: 240 [P.O.:240] Out: -  Intake/Output this shift: No intake/output data recorded.  General appearance: alert, cooperative and no distress Wound: moderate drainage from [facility] wound, sternal drainage appears to have stopped, no cellulitis noted  Lab Results: Recent Labs    02/27/20 0231 02/28/20 0242  WBC 6.0 6.4  HGB 8.4* 7.8*  HCT 27.1* 25.6*  PLT 325 333   BMET:  Recent Labs    02/27/20 0231 02/28/20 0242  NA 139 138  K 5.1 3.9  CL 105 106  CO2 22 26  GLUCOSE 161* 174*  BUN 13 10  CREATININE 0.95 0.87  CALCIUM 8.8* 8.6*    PT/INR:  Recent Labs    02/25/20 1450  LABPROT 14.4  INR 1.2   ABG    Component Value Date/Time   PHART 7.342 (L) 02/08/2020 2029   HCO3 21.7 02/08/2020 2029   TCO2 23 02/08/2020 2029   ACIDBASEDEF 4.0 (H) 02/08/2020 2029   O2SAT 99.0 02/08/2020 2029   CBG (last 3)  Recent Labs    02/27/20 1126 02/27/20 1650 02/27/20 2158  GLUCAP 111* 167* 166*    Meds Scheduled Meds: . amLODipine  5 mg Oral Daily  . aspirin  325 mg Oral Daily  . atorvastatin  80 mg Oral q1800  . carvedilol  25 mg Oral BID WC  . cephALEXin  500 mg Oral TID  . heparin  5,000 Units Subcutaneous Q8H  . insulin aspart  0-5 Units Subcutaneous QHS  . insulin aspart  0-9 Units Subcutaneous TID WC  . losartan  50 mg Oral Daily  . potassium chloride SA  40 mEq Oral  Daily  . QUEtiapine  25 mg Oral BID  . sodium chloride flush  3 mL Intravenous Q12H   Continuous Infusions: PRN Meds:.acetaminophen **OR** acetaminophen, bismuth subsalicylate, calcium carbonate, morphine injection, oxyCODONE  Xrays No results found.  Assessment/Plan:  1 afeb, VSS 2 sats good on RA 3 evh wound site still with mod drainage, fatty necrosis, may need further debridement prior to [facility] placement. Chest incis looks good 4 no leukocytosis 5 anemia is stable 6 renal fxn stable     LOS: 3 days    Rowe Clack PA-C Pager [phone] 02/28/2020

## 2020-02-28 NOTE — Care Management (Signed)
[address] [place] [address]  joeletta

## 2020-02-28 NOTE — Plan of Care (Signed)
Continue to monitor

## 2020-02-28 NOTE — TOC Initial Note (Addendum)
Transition of Care [facility]) - Initial/Assessment Note    Patient Details  Name: Bianca [name] MRN: [id number] Date of Birth: 1946/04/09  Transition of Care [facility]) CM/SW Contact:    Verdell Carmine, RN Phone Number: 02/28/2020, 5:11 PM  Clinical Narrative:                 Patient admitted with dehiscence of leg incision.from vein harvest for CABG. Spoke to daughter over the phone. Discussed  What need she feels she has for patient at discharge. Daughter will take patient to her home in [place]. Daughter Delina Kruczek [place] [id number]    She has concerns about stopping patient metformin, last time it was stopped the patient got diarrheal She also requests that we give some notice for discharge, if we can tell her the day before if known to prepare and be here to speak to the doctor.  I called and spoke to [facility] regarding these concerns. The metformin is not on currently due to worry of patient getting hypoglycemic. The patient is currently stable blood sugars. As far as discharge, It is sometimes difficult to know, but the team makes every effort to make sure the plan is in place and the family is aware. They have used [facility] home [facility] after last hospitalization and will use them again.  CM will follow for needs. Did speak to daughter about patients noncompliance with treatments.    Expected Discharge Plan: [place] Barriers to Discharge: Continued Medical Work up   Patient Goals and CMS Choice  Chooses [facility] for home health. Will need PT eval when able Recommend full [place] services (RN, Aide PT OT)   Choice offered to / list presented to : (Daughter states she had [place] home health previously)  Expected Discharge Plan and Services Expected Discharge Plan: [place]   Discharge Planning Services: CM Consult Post Acute Care Choice: Durable Medical Equipment Living arrangements for the past 2 months: Apartment                                       Prior Living Arrangements/Services Living arrangements for the past 2 months: Apartment Lives with:: Self(WIll be going to live with daughter) Patient language and need for interpreter reviewed:: Yes        Need for Family Participation in Patient Care: Yes (Comment) Care giver support system in place?: Yes (comment)   Criminal Activity/Legal Involvement Pertinent to Current Situation/Hospitalization: No - Comment as needed  Activities of Daily Living Home Assistive Devices/Equipment: Eyeglasses ADL Screening (condition at time of admission) Patient's cognitive ability adequate to safely complete daily activities?: Yes Is the patient deaf or have difficulty hearing?: Yes Does the patient have difficulty seeing, even when wearing glasses/contacts?: No Does the patient have difficulty concentrating, remembering, or making decisions?: No Patient able to express need for assistance with ADLs?: Yes Does the patient have difficulty dressing or bathing?: No Independently performs ADLs?: Yes (appropriate for developmental age) Does the patient have difficulty walking or climbing stairs?: Yes Weakness of Legs: Both Weakness of Arms/Hands: None  Permission Sought/Granted Permission sought to share information with : Case Manager    Share Information with NAME: Crissie Aloi  Permission granted to share info w AGENCY: [place] granted to share info w Relationship: daughter  Permission granted to share info w Contact Information: [phone]  Emotional Assessment Appearance:: Appears stated age Attitude/Demeanor/Rapport: Unable to Assess Affect (typically observed): Unable to Assess(Patient resting)   Alcohol / Substance Use: Not Applicable Psych Involvement: No (comment)  Admission diagnosis:  Wound dehiscence [T81.30XA] Patient Active Problem List   Diagnosis Date Noted  . Wound dehiscence 02/25/2020  . S/P CABG x 4 02/08/2020  . Coronary  artery disease 02/08/2020  . Chest pain 02/03/2020  . Unstable angina (HCC) 02/03/2020  . Hypokalemia 02/03/2020  . CAD (coronary artery disease) 01/31/2020  . Pain due to onychomycosis of toenails of both feet 01/30/2020  . Chest pain of uncertain etiology 01/29/2020  . Non-STEMI (non-ST elevated myocardial infarction) (HCC) 01/29/2020  . Essential hypertension 01/29/2020  . Hyperlipidemia 01/29/2020  . Acute diastolic CHF (congestive heart failure) (HCC) 01/06/2020  . CHF (congestive heart failure) (HCC) 01/06/2020  . Noncompliance with medication regimen 08/17/2018  . Noncompliance with diagnostic testing 08/17/2018  . Type 2 diabetes mellitus (HCC) 04/17/2008  . TOBACCO ABUSE 04/17/2008   PCP:  Luciano Cutter, PA-C Pharmacy:   [id number] [place], [place] - [phone] AT [facility] & [address] [place] [address] Phone: [phone] Fax: [phone]     Social Determinants of Health (SDOH) Interventions    Readmission Risk Interventions Readmission Risk Prevention Plan 02/15/2020 02/14/2020  Transportation Screening Complete -  PCP or Specialist Appt within 5-7 Days Complete -  Home Care Screening Complete -  Medication Review (RN CM) Complete -  HRI or Home Care Consult - Complete  Social Work Consult for Recovery Care Planning/Counseling - Complete  Palliative Care Screening - Not Applicable  Some recent data might be hidden

## 2020-02-28 NOTE — Progress Notes (Signed)
PROGRESS NOTE  Bianca [name] RCV:[id number] DOB: Oct 15, 1945 DOA: 02/25/2020 PCP: Julian Hy, PA-C  Brief History   Bianca [name] is a 74 y.o. female with medical history significant of CAD s/p CABG 02/08/20, HTN, DM2, HLD who presents for wound delitescence from her right leg vein harvest.  Daughter in room with the patient assists with the history.  Patient did well with surgery, returned home for home health PT.   Patient and daughter note that the incision opened 5-6 days ago.  Since that time they have noticed more swelling of the right leg, more pain in the leg and she has had decreased exercise tolerance.  She also has had diarrhea since given a stool softener after the surgery.  She did restart her metformin at full dose after the surgery.  Her daughter reports 3-4 stools per day, soft to watery.  Sometimes she sees undigested food in the stool.  It is worse after a metformin dose.  Started before taking keflex for wound dehiscence.  Further they note continued confusion since the hospital and needing reminders frequently.  Finally, noted to have white discharge from the distal part of the chest surgical wound.    [facility] were consulted to admit the patient for further evaluation and treatment. [facility] has been consulted for management of the wound.  The patient has intermittently been oppositional to wound care and administration of antibiotics, antihypertensives, etc. There is family in the room today, and they state that she is more agreeable today.  Consultants  . TCTS  Procedures  . None  Antibiotics   Anti-infectives (From admission, onward)   Start     Dose/Rate Route Frequency Ordered Stop   02/25/20 1600  cephALEXin (KEFLEX) capsule 500 mg     500 mg Oral 3 times daily 02/25/20 1403       Subjective  The patient is resting quietly in bed. No new complaints.  Objective   Vitals:  Vitals:   02/28/20 0859  02/28/20 1110  BP: (!) 155/79 138/67  Pulse:  87  Resp: 19 15  Temp:  98.5 F (36.9 C)  SpO2:  98%    Exam:  Constitutional:  . The patient is awake, alert, and oriented x 3. No acute distress. . No increased work of breathing. . No wheezes, rales, or rhonchi . No tactile fremitus Cardiovascular:  . Regular rate and rhythm . No murmurs, ectopy, or gallups. . No lateral PMI. No thrills. Abdomen:  . Abdomen is soft, non-tender, non-distended . No hernias, masses, or organomegaly . Normoactive bowel sounds.  Musculoskeletal:  . No cyanosis, clubbing, or edema. Right lower extremity wound is bandaged. Skin:  . No rashes, lesions, ulcers . palpation of skin: no induration or nodules Neurologic:  . CN 2-12 intact . Sensation all 4 extremities intact Psychiatric:  . Mental status o Mood, affect appropriate o Orientation to person, place, time  . judgment and insight appear intact  I have personally reviewed the following:   Today's Data  . Vitals, CBC, BMP  Micro Data  . MRSA and staphylococcus negative PCR  Scheduled Meds: . amLODipine  5 mg Oral Daily  . aspirin  325 mg Oral Daily  . atorvastatin  80 mg Oral q1800  . carvedilol  25 mg Oral BID WC  . cephALEXin  500 mg Oral TID  . heparin  5,000 Units Subcutaneous Q8H  . insulin aspart  0-5 Units Subcutaneous QHS  . insulin aspart  0-9 Units Subcutaneous TID WC  . losartan  50 mg Oral Daily  . potassium chloride SA  40 mEq Oral Daily  . sodium chloride flush  3 mL Intravenous Q12H   Continuous Infusions:  Active Problems:   Type 2 diabetes mellitus (HCC)   CHF (congestive heart failure) (HCC)   Essential hypertension   Hyperlipidemia   CAD (coronary artery disease)   Hypokalemia   S/P CABG x 4   Wound dehiscence   LOS: 3 days   A & P  Wound dehiscence/Unilateral Leg swelling: Wound is painful and draining. TCTS has been consulted to address the wound. It is anticipated that the patient will go for  operative debridement and cultures by TCTS. There is also a small open area at the distal end of the sternal wound. The patient will receive oxycodone and morphine for severe pain only. Doppler of lower extremities was negative for DVT.   CAD (coronary artery disease) S/P CABG x 4: The patient is receiving ASA 325 mg daily, lipitor 80mg  daily, Coreg 25 mg bid, and Cozaar 50 mg daily. The patient denies chest pain. She is being monitored on telemetry.  Agitation: Pt was oppositional yesterday refusing medications and wound care. Family and patient have also refused low dose seroquel. Family at bedside state that she is not refusing interventions today.  Diarrhea:  The timing does not appear to be related to antibiotic use. Metformin has been held. No signs of infection.   Type 2 diabetes mellitus: Glucoses will be followed with FSBS and SSI. Metformin has been held. (122-167).    Chronic diastolic CHF (congestive heart failure): Unilateral lower extremity swelling, no obvious signs of acute process. Continue lasix and potassium as at home.     Essential hypertension: Continue home BP medications, monitor for hypotension    Hyperlipidemia: Continue statin    Hypokalemia: Monitor and supplement potassium as necessary.  I have seen and examined this patient myself. I have spent 34 minutes in her evaluation and care.    DVT prophylaxis:      Heparin SQ  Code Status:              Full  Family Communication:       Son is at bedside Disposition Plan: The patient is from home, anticipate discharge to home. Barrier to discharge: Need for surgical attention to dehisced wound with cultures and stabilization. Possible need for operative debridement.  Priyansh Pry, DO [facility] Direct contact: see [email or web address]  7PM-7AM contact night coverage as above 02/28/2020, 4:11 PM  LOS: 1 day

## 2020-02-29 ENCOUNTER — Ambulatory Visit: Payer: Medicare Other | Admitting: Thoracic Surgery (Cardiothoracic Vascular Surgery)

## 2020-02-29 DIAGNOSIS — E440 Moderate protein-calorie malnutrition: Secondary | ICD-10-CM | POA: Insufficient documentation

## 2020-02-29 LAB — CBC WITH DIFFERENTIAL/PLATELET
Abs Immature Granulocytes: 0.11 10*3/uL — ABNORMAL HIGH (ref 0.00–0.07)
Basophils Absolute: 0 10*3/uL (ref 0.0–0.1)
Basophils Relative: 0 %
Eosinophils Absolute: 0 10*3/uL (ref 0.0–0.5)
Eosinophils Relative: 0 %
HCT: 26.2 % — ABNORMAL LOW (ref 36.0–46.0)
Hemoglobin: 8.1 g/dL — ABNORMAL LOW (ref 12.0–15.0)
Immature Granulocytes: 1 %
Lymphocytes Relative: 14 %
Lymphs Abs: 1.7 10*3/uL (ref 0.7–4.0)
MCH: 27.2 pg (ref 26.0–34.0)
MCHC: 30.9 g/dL (ref 30.0–36.0)
MCV: 87.9 fL (ref 80.0–100.0)
Monocytes Absolute: 1 10*3/uL (ref 0.1–1.0)
Monocytes Relative: 8 %
Neutro Abs: 9.5 10*3/uL — ABNORMAL HIGH (ref 1.7–7.7)
Neutrophils Relative %: 77 %
Platelets: 334 10*3/uL (ref 150–400)
RBC: 2.98 MIL/uL — ABNORMAL LOW (ref 3.87–5.11)
RDW: 15.5 % (ref 11.5–15.5)
WBC: 12.5 10*3/uL — ABNORMAL HIGH (ref 4.0–10.5)
nRBC: 0 % (ref 0.0–0.2)

## 2020-02-29 LAB — GLUCOSE, CAPILLARY
Glucose-Capillary: 95 mg/dL (ref 70–99)
Glucose-Capillary: 132 mg/dL — ABNORMAL HIGH (ref 70–99)
Glucose-Capillary: 135 mg/dL — ABNORMAL HIGH (ref 70–99)
Glucose-Capillary: 250 mg/dL — ABNORMAL HIGH (ref 70–99)

## 2020-02-29 MED ORDER — ENSURE ENLIVE PO LIQD
237.0000 mL | Freq: Two times a day (BID) | ORAL | Status: DC
  Administered 2020-02-29 – 2020-03-02 (×5): 237 mL via ORAL

## 2020-02-29 MED ORDER — ADULT MULTIVITAMIN W/MINERALS CH
1.0000 | ORAL_TABLET | Freq: Every day | ORAL | Status: DC
  Administered 2020-02-29: 1 via ORAL
  Filled 2020-02-29: qty 1

## 2020-02-29 NOTE — Plan of Care (Signed)
Continue to monitor

## 2020-02-29 NOTE — Care Management Important Message (Signed)
Important Message  Patient Details  Name: Bianca [name] MRN: [id number] Date of Birth: 06/11/1946   Medicare Important Message Given:  Yes     Renie Ora 02/29/2020, 10:55 AM

## 2020-02-29 NOTE — TOC Progression Note (Signed)
Transition of Care [facility]) - Progression Note    Patient Details  Name: CARIE KAPUSCINSKI MRN: [id number] Date of Birth: [birth year]  Transition of Care [facility]) CM/SW Contact  Lockie Pares, RN Phone Number: 02/29/2020, 10:47 AM  Clinical Narrative:    Spoke to daughter early this morning directed to Gershon Crane who was walking in to see patient. Plan today is to debride SVG site at bedside and potentially  place wound vac. Leonia Reeves from [place] called to give notice on probable wound vac placement. Wound vac paperwork on shadow chart. On call TCTS [place] notified that paperwork on shadow chart.   Expected Discharge Plan: Home w Home Health Services Barriers to Discharge: Continued Medical Work up  Expected Discharge Plan and Services Expected Discharge Plan: Home w Home Health Services   Discharge Planning Services: CM Consult Post Acute Care Choice: Durable Medical Equipment Living arrangements for the past 2 months: Apartment                    Patient will be residing at daughters house post hospitalization.                    Social Determinants of Health (SDOH) Interventions    Readmission Risk Interventions Readmission Risk Prevention Plan 02/15/2020 02/14/2020  Transportation Screening Complete -  PCP or Specialist Appt within 5-7 Days Complete -  Home Care Screening Complete -  Medication Review (RN CM) Complete -  HRI or Home Care Consult - Complete  Social Work Consult for Recovery Care Planning/Counseling - Complete  Palliative Care Screening - Not Applicable  Some recent data might be hidden

## 2020-02-29 NOTE — Evaluation (Signed)
Physical Therapy Evaluation Patient Details Name: Bianca [name] MRN: [id number] DOB: 09-Jun-1946 Today's Date: 02/29/2020   History of Present Illness  74 y.o. female with medical history significant of CAD s/p CABG 02/08/20, HTN, DM2, HLD who presents for wound delitescence from her right leg vein harvest. PT also experiencing 3-4 days of soft stool immediately prior to admission. Pt underwent wound vac placement to RLE on 5/21.  Clinical Impression  Pt presents to PT with deficits in functional mobility, gait, balance, cognition, endurance, strength, power. Pt is generally weak and has no recall of sternal precautions at this time. PT providing significant education on sternal precautions and application of precautions during mobility as pt often attempting to push/pull with BUE. Pt with reduced LE strength and requiring PT assistance to power up to standing. Pt will benefit from aggressive mobilization and continued acute PT POC to improve transfer and gait quality and tolerance. PT recommending home with home health PT and assistance form family for all OOB mobility, anticipating pt will progress with continued mobilization. Pt reports owning no DME however this will need to be confirmed with son, patient will need a RW and 3 in one commode to utilize at home if these are not already owned by the patient.    Follow Up Recommendations Home health PT;Supervision/Assistance - 24 hour(PT anticipates progression in strength and mobility)    Equipment Recommendations  Rolling walker with 5" wheels;3in1 (PT)(need to confirm DME, pt may have this since last admission)    Recommendations for Other Services       Precautions / Restrictions Precautions Precautions: Fall;Sternal Precaution Booklet Issued: No Precaution Comments: RLE wound vac Restrictions Weight Bearing Restrictions: Yes RUE Partial Weight Bearing Percentage or Pounds: Sternal Precautions LUE Partial Weight Bearing Percentage or  Pounds: Sternal Precautions      Mobility  Bed Mobility Overal bed mobility: Needs Assistance Bed Mobility: Supine to Sit     Supine to sit: Mod assist     General bed mobility comments: modA to turn hips to edge of bed as pt unable to push with UE due to sternal precautions  Transfers Overall transfer level: Needs assistance   Transfers: Sit to/from Stand Sit to Stand: Mod assist         General transfer comment: assist to power up into standing, PT providing multiple cues for hand placement in order to maintain sternal precautions  Ambulation/Gait Ambulation/Gait assistance: Min guard Gait Distance (Feet): 30 Feet Assistive device: Rolling walker (2 wheeled) Gait Pattern/deviations: Step-to pattern Gait velocity: reduced Gait velocity interpretation: <1.8 ft/sec, indicate of risk for recurrent falls General Gait Details: pt with short step to gait with increased trunk flexion, slowed gait speed  Stairs            Wheelchair Mobility    Modified Rankin (Stroke Patients Only)       Balance Overall balance assessment: Needs assistance Sitting-balance support: Single extremity supported;Feet supported Sitting balance-Leahy Scale: Fair Sitting balance - Comments: close supervision   Standing balance support: Bilateral upper extremity supported Standing balance-Leahy Scale: Fair Standing balance comment: close supervision                             Pertinent Vitals/Pain Pain Assessment: Faces Faces Pain Scale: Hurts little more Pain Location: RLE at wound vac site Pain Descriptors / Indicators: Grimacing Pain Intervention(s): Monitored during session    Home Living Family/patient expects to be discharged to:: Private residence  Living Arrangements: Children Available Help at Discharge: Family;Available 24 hours/day Type of Home: Apartment Home Access: Stairs to enter Entrance Stairs-Rails: Right Entrance Stairs-Number of Steps: 5 Home  Layout: One level Home Equipment: Tub bench(pt denies RW, will need to confirm with family)      Prior Function Level of Independence: Independent         Comments: independent prior to CABG     Hand Dominance   Dominant Hand: Right    Extremity/Trunk Assessment   Upper Extremity Assessment Upper Extremity Assessment: Generalized weakness    Lower Extremity Assessment Lower Extremity Assessment: Generalized weakness    Cervical / Trunk Assessment Cervical / Trunk Assessment: Kyphotic  Communication   Communication: No difficulties(delayed responses at times)  Cognition Arousal/Alertness: Awake/alert Behavior During Therapy: Flat affect;Impulsive Overall Cognitive Status: Impaired/Different from baseline Area of Impairment: Memory;Following commands;Safety/judgement;Awareness;Problem solving                     Memory: Decreased recall of precautions;Decreased short-term memory Following Commands: Follows one step commands with increased time Safety/Judgement: Decreased awareness of safety;Decreased awareness of deficits Awareness: Intellectual Problem Solving: Slow processing;Decreased initiation;Requires verbal cues;Requires tactile cues General Comments: unable to recall sternal precautions, requires multiple verbal cues for maintenance of sternal precautions during mobility      General Comments General comments (skin integrity, edema, etc.): VSS on RA, pt is impulsive, not wanting to walk back to recliner because she "doesn't feel like walking anymore". PT pushing recliner to patient as patient expressing the desire to sit in chair without armrests of brakes. Pt assisted into recliner.    Exercises     Assessment/Plan    PT Assessment Patient needs continued PT services  PT Problem List Decreased strength;Decreased activity tolerance;Decreased balance;Decreased mobility;Decreased cognition;Decreased knowledge of use of DME;Decreased safety  awareness;Decreased knowledge of precautions       PT Treatment Interventions DME instruction;Gait training;Stair training;Functional mobility training;Therapeutic activities;Therapeutic exercise;Balance training;Neuromuscular re-education;Cognitive remediation;Patient/family education    PT Goals (Current goals can be found in the Care Plan section)  Acute Rehab PT Goals Patient Stated Goal: to improve mobility and go home PT Goal Formulation: With patient/family Time For Goal Achievement: 03/14/20 Potential to Achieve Goals: Good Additional Goals Additional Goal #1: pt will maintain dynamic standing balance within 10 inches of her base of support with unilateral UE support of the LRAD    Frequency Min 3X/week   Barriers to discharge        Co-evaluation               AM-PAC PT "6 Clicks" Mobility  Outcome Measure Help needed turning from your back to your side while in a flat bed without using bedrails?: A Little Help needed moving from lying on your back to sitting on the side of a flat bed without using bedrails?: A Lot Help needed moving to and from a bed to a chair (including a wheelchair)?: A Little Help needed standing up from a chair using your arms (e.g., wheelchair or bedside chair)?: A Lot Help needed to walk in hospital room?: A Little Help needed climbing 3-5 steps with a railing? : A Lot 6 Click Score: 15    End of Session   Activity Tolerance: Other (comment)(self-limiting) Patient left: in chair;with call bell/phone within reach;with family/visitor present Nurse Communication: Mobility status PT Visit Diagnosis: Muscle weakness (generalized) (M62.81);Other abnormalities of gait and mobility (R26.89)    Time: 4132-4401 PT Time Calculation (min) (ACUTE ONLY): 25 min  Charges:   PT Evaluation $PT Eval Moderate Complexity: 1 Mod PT Treatments $Therapeutic Activity: 8-22 mins        Arlyss Gandy, PT, DPT Acute Rehabilitation Pager:  [phone]   Arlyss Gandy 02/29/2020, 3:55 PM

## 2020-02-29 NOTE — Progress Notes (Signed)
Patient's temp 100.6 this am. Tylenol given. Jari Favre, PA aware. Will continue to monitor.

## 2020-02-29 NOTE — Progress Notes (Addendum)
      [address]       Bianca [name] [address]             [phone]         Subjective: Low grade temp- tmax 100.6  Objective: Vital signs in last 24 hours: Temp:  [98.2 F (36.8 C)-100.6 F (38.1 C)] 100.6 F (38.1 C) (05/21 0800) Pulse Rate:  [87-103] 98 (05/21 0400) Cardiac Rhythm: Normal sinus rhythm;Bundle branch block;Heart block (05/21 0700) Resp:  [15-23] 20 (05/21 0800) BP: (115-155)/(57-81) 117/57 (05/21 0800) SpO2:  [95 %-98 %] 98 % (05/21 0800)  Hemodynamic parameters for last 24 hours:    Intake/Output from previous day: 05/20 0701 - 05/21 0700 In: 490 [P.[name].:490] Out: -  Intake/Output this shift: Total I/[name] In: 240 [P.[name].:240] Out: -   General appearance: alert, cooperative and no distress Wound: evh site with some more granulation tissue, mod seropurulent drainage with some fatty necrosis. Sternal incis without current drainage or cellulitis  Lab Results: Recent Labs    02/28/20 0242 02/29/20 0319  WBC 6.4 12.5*  HGB 7.8* 8.1*  HCT 25.6* 26.2*  PLT 333 334   BMET:  Recent Labs    02/27/20 0231 02/28/20 0242  NA 139 138  K 5.1 3.9  CL 105 106  CO2 22 26  GLUCOSE 161* 174*  BUN 13 10  CREATININE 0.95 0.87  CALCIUM 8.8* 8.6*    PT/INR: No results for input(s): LABPROT, INR in the last 72 hours. ABG    Component Value Date/Time   PHART 7.342 (L) 02/08/2020 2029   HCO3 21.7 02/08/2020 2029   TCO2 23 02/08/2020 2029   ACIDBASEDEF 4.0 (H) 02/08/2020 2029   O2SAT 99.0 02/08/2020 2029   CBG (last 3)  Recent Labs    02/28/20 1700 02/28/20 2140 02/29/20 0633  GLUCAP 125* 108* 95    Meds Scheduled Meds: . amLODipine  5 mg Oral Daily  . aspirin  325 mg Oral Daily  . atorvastatin  80 mg Oral q1800  . carvedilol  25 mg Oral BID WC  . cephALEXin  500 mg Oral TID  . heparin  5,000 Units Subcutaneous Q8H  . insulin aspart  0-5 Units Subcutaneous QHS  . insulin aspart  0-9 Units Subcutaneous TID WC  . losartan  50  mg Oral Daily  . potassium chloride SA  40 mEq Oral Daily  . sodium chloride flush  3 mL Intravenous Q12H   Continuous Infusions: PRN Meds:.acetaminophen **OR** acetaminophen, bismuth subsalicylate, calcium carbonate, LORazepam, morphine injection, oxyCODONE  Xrays No results found.  Assessment/Plan:   1 overall very stable  2 wound slowly improving, cont dressing changes and keflex, may need further debridement prior to vac placement.      LOS: 4 days    Rowe Clack PA-C Pager [phone] 02/29/2020   Will continue wound care Needs aggressive physical therapy Bianca [name] Bianca [name]

## 2020-02-29 NOTE — Progress Notes (Signed)
Plan of care reviewed. Pt appeared drowsy, flat affect, lethargy, when staff nurse asked questions, she did not answer and closed her eyes.  She was more drowsy today after taking Ativan per RN day shift reported.  Pt's hemodynamically stable.  Temp 98.4 - 99.9,  BP 118/69 - 119/ 61 mmHg, HR 90s-100, NSR,BBB,1st degree AB-block on monitor. RR 18-22, SPO2 96-98%, MEW score was yellow from RR and low grade of high tempurature.   Right leg presented with minimal serosanguinous drainage, wet to dry dressing changed BID.  Sternal incision presented with minimal serous drainage, cleaned with Betadine and dry dressing applied.  No immediate distress noted. Will continue to monitor.  Filiberto Pinks, RN

## 2020-02-29 NOTE — Progress Notes (Signed)
Dr. Cliffton Asters at bedside for wound vac placement to right leg wound. Patient tolerated well. Wound vac at 125 continuous suction.

## 2020-02-29 NOTE — Progress Notes (Signed)
Initial Nutrition Assessment  DOCUMENTATION CODES:   Non-severe (moderate) malnutrition in context of acute illness/injury  INTERVENTION:    Ensure Enlive po BID, each supplement provides 350 kcal and 20 grams of protein  MVI daily  NUTRITION DIAGNOSIS:   Moderate Malnutrition related to acute illness(CAD s/p CABG x4) as evidenced by energy intake < 75% for > 7 days, mild fat depletion, mild muscle depletion.  GOAL:   Patient will meet greater than or equal to 90% of their needs  MONITOR:   PO intake, Supplement acceptance, Weight trends, Labs, I & O's  REASON FOR ASSESSMENT:   Consult Assessment of nutrition requirement/status  ASSESSMENT:   Patient with PMH significant for CAD s/p CABG x4 (02/09/2020), HTN, DM, and HLD. Presents this admission with R leg wound dehiscence and unilateral leg swelling.   Pt may need further debridement.   Son at bedside endorses pt has loss in appetite over the last month. States she is provided with three meals daily (eggs, fish, vegetables, grains) but only consumes a few bites at each. Does not use supplementation. Appetite slow to progress this admission. Last two meal completions charted as 25%. RD observed 1/2 of a [place] sandwich left on pts tray. Discussed the importance of protein intake for preservation of lean body mass and to promote wound healing.   Son denies pt had recent wt loss. Pt is weighed daily by family and weights have remained consistent. Weight shows to fluctuate from 74.5 kg on 5/7 to 71 kg this admission. Unsure if this is actual dry weight loss or fluid fluctuation.   Medications: SS novolog, 40 mEq KCl daily  Labs: CBG 95-133  NUTRITION - FOCUSED PHYSICAL EXAM:    Most Recent Value  Orbital Region  No depletion  Upper Arm Region  Mild depletion  Thoracic and Lumbar Region  Unable to assess  Buccal Region  Mild depletion  Temple Region  Mild depletion  Clavicle Bone Region  Mild depletion  Clavicle and  Acromion Bone Region  Mild depletion  Scapular Bone Region  Unable to assess  Dorsal Hand  Unable to assess  Patellar Region  Mild depletion  Anterior Thigh Region  Mild depletion  Posterior Calf Region  Mild depletion  Edema (RD Assessment)  Moderate  Hair  Reviewed  Eyes  Reviewed  Mouth  Reviewed  Skin  Reviewed  Nails  Reviewed      Diet Order:   Diet Order            Diet heart healthy/carb modified Room service appropriate? Yes; Fluid consistency: Thin  Diet effective now              EDUCATION NEEDS:   Not appropriate for education at this time  Skin:  Skin Assessment: Skin Integrity Issues: Skin Integrity Issues:: Incisions Incisions: R pretibial, chest  Last BM:  5/18  Height:   Ht Readings from Last 1 Encounters:  02/25/20 5\' 6"  (1.676 m)    Weight:   Wt Readings from Last 1 Encounters:  02/26/20 71 kg    BMI:  Body mass index is 25.26 kg/m.  Estimated Nutritional Needs:   Kcal:  1900-2100 kcal  Protein:  95-115 grams  Fluid:  >/= 1.9 L/day   02/28/20 RD, LDN Clinical Nutrition Pager listed in AMION

## 2020-02-29 NOTE — Progress Notes (Signed)
PROGRESS NOTE  Bianca [name] XTG:[id number] DOB: 17-Apr-1946 DOA: 02/25/2020 PCP: Luciano Cutter, PA-C  Brief History   Bianca [name] is a 74 y.o. female with medical history significant of CAD s/p CABG 02/08/20, HTN, DM2, HLD who presents for wound delitescence from her right leg vein harvest.  Daughter in room with the patient assists with the history.  Patient did well with surgery, returned home for home health PT.   Patient and daughter note that the incision opened 5-6 days ago.  Since that time they have noticed more swelling of the right leg, more pain in the leg and she has had decreased exercise tolerance.  She also has had diarrhea since given a stool softener after the surgery.  She did restart her metformin at full dose after the surgery.  Her daughter reports 3-4 stools per day, soft to watery.  Sometimes she sees undigested food in the stool.  It is worse after a metformin dose.  Started before taking keflex for wound dehiscence.  Further they note continued confusion since the hospital and needing reminders frequently.  Finally, noted to have white discharge from the distal part of the chest surgical wound.    [facility] were consulted to admit the patient for further evaluation and treatment. [facility] has been consulted for management of the wound.  The patient has intermittently been oppositional to wound care and administration of antibiotics, antihypertensives, etc. She seems to be much more agreeable with family at bedside. Wound vac to be placed today per TCTS. Consultants  . TCTS  Procedures  . None  Antibiotics   Anti-infectives (From admission, onward)   Start     Dose/Rate Route Frequency Ordered Stop   02/25/20 1600  cephALEXin (KEFLEX) capsule 500 mg     500 mg Oral 3 times daily 02/25/20 1403       Subjective  The patient is resting quietly in bed. No new complaints.  Objective   Vitals:  Vitals:    02/29/20 1030 02/29/20 1200  BP: (!) 117/59 113/73  Pulse: 82 85  Resp: 18 20  Temp: 98.2 F (36.8 C) 97.9 F (36.6 C)  SpO2:  99%    Exam:  Constitutional:  . The patient is awake, alert, and oriented x 3. No acute distress. . No increased work of breathing. . No wheezes, rales, or rhonchi . No tactile fremitus Cardiovascular:  . Regular rate and rhythm . No murmurs, ectopy, or gallups. . No lateral PMI. No thrills. Abdomen:  . Abdomen is soft, non-tender, non-distended . No hernias, masses, or organomegaly . Normoactive bowel sounds.  Musculoskeletal:  . No cyanosis, clubbing, or edema. Right lower extremity wound is bandaged. Skin:  . No rashes, lesions, ulcers . palpation of skin: no induration or nodules Neurologic:  . CN 2-12 intact . Sensation all 4 extremities intact Psychiatric:  . Mental status o Mood, affect appropriate o Orientation to person, place, time  . judgment and insight appear intact  I have personally reviewed the following:   Today's Data  . Vitals, CBC, BMP  Micro Data  . MRSA and staphylococcus negative PCR  Scheduled Meds: . amLODipine  5 mg Oral Daily  . aspirin  325 mg Oral Daily  . atorvastatin  80 mg Oral q1800  . carvedilol  25 mg Oral BID WC  . cephALEXin  500 mg Oral TID  . heparin  5,000 Units Subcutaneous Q8H  . insulin aspart  0-5 Units Subcutaneous  QHS  . insulin aspart  0-9 Units Subcutaneous TID WC  . losartan  50 mg Oral Daily  . potassium chloride SA  40 mEq Oral Daily  . sodium chloride flush  3 mL Intravenous Q12H   Continuous Infusions:  Active Problems:   Type 2 diabetes mellitus (HCC)   CHF (congestive heart failure) (HCC)   Essential hypertension   Hyperlipidemia   CAD (coronary artery disease)   Hypokalemia   S/P CABG x 4   Wound dehiscence   LOS: 4 days   A & P  Wound dehiscence/Unilateral Leg swelling: Wound is painful and draining. TCTS has been consulted to address the wound. It is  anticipated that the patient will go for operative debridement and cultures by TCTS. There is also a small open area at the distal end of the sternal wound. The patient will receive oxycodone and morphine for severe pain only. Doppler of lower extremities was negative for DVT. Wound vac placement as per TCTS today.   CAD (coronary artery disease) S/P CABG x 4: The patient is receiving ASA 325 mg daily, lipitor 80mg  daily, Coreg 25 mg bid, and Cozaar 50 mg daily. The patient denies chest pain. She is being monitored on telemetry.  Agitation: Improved. Pt was oppositional yesterday refusing medications and wound care. Family and patient have also refused low dose seroquel. Family at bedside state that she is not refusing interventions today.  Diarrhea:  The timing does not appear to be related to antibiotic use. Metformin has been held. No signs of infection.   Type 2 diabetes mellitus: Glucoses will be followed with FSBS and SSI. Metformin has been held. (95-132).    Chronic diastolic CHF (congestive heart failure): Unilateral lower extremity swelling, no obvious signs of acute process. Continue lasix and potassium as at home.     Essential hypertension: Continue home BP medications, monitor for hypotension    Hyperlipidemia: Continue statin    Hypokalemia: Monitor and supplement potassium as necessary.  I have seen and examined this patient myself. I have spent 30 minutes in her evaluation and care.    DVT prophylaxis:      Heparin SQ  Code Status:              Full  Family Communication:       Daughter and Son at bedside Disposition Plan: The patient is from home, anticipate discharge to home. Barrier to discharge: Need for surgical attention to dehisced wound with cultures and stabilization. Wound vac placement.  Possible need for operative debridement per TCTS.  Narcissa Melder, DO [facility] Direct contact: see [email or web address]  7PM-7AM contact night coverage as above 02/28/2020,  4:11 PM  LOS: 1 day

## 2020-02-29 NOTE — Consult Note (Signed)
   [facility] Inpatient Consult   02/29/2020  Bianca [name] [birth year] [id number]  We have reviewed your referral request per inpatient Transition of Care team member and request for assignment for  post hospital outreach.  Patient is noted to have a primary care provider in the [facility] which is not in network.  Follow up with inpatient [facility] RNCM reveals patient's primary care is with [facility]. Patient will have home health to follow per notes.  Contacted referral source for particular needs post hospital needs.  Charlesetta Shanks, RN BSN CCM [facility]  [phone] business mobile phone Toll free office [phone]  Fax number: [phone] [email or web address]

## 2020-03-01 LAB — BASIC METABOLIC PANEL
Anion gap: 10 (ref 5–15)
BUN: 18 mg/dL (ref 8–23)
CO2: 24 mmol/L (ref 22–32)
Calcium: 8.5 mg/dL — ABNORMAL LOW (ref 8.9–10.3)
Chloride: 105 mmol/L (ref 98–111)
Creatinine, Ser: 1.06 mg/dL — ABNORMAL HIGH (ref 0.44–1.00)
GFR calc Af Amer: 59 mL/min — ABNORMAL LOW (ref >60)
GFR calc non Af Amer: 51 mL/min — ABNORMAL LOW (ref >60)
Glucose, Bld: 188 mg/dL — ABNORMAL HIGH (ref 70–99)
Potassium: 4.4 mmol/L (ref 3.5–5.1)
Sodium: 139 mmol/L (ref 135–145)

## 2020-03-01 LAB — CBC WITH DIFFERENTIAL/PLATELET
Abs Immature Granulocytes: 0.12 10*3/uL — ABNORMAL HIGH (ref 0.00–0.07)
Basophils Absolute: 0 10*3/uL (ref 0.0–0.1)
Basophils Relative: 0 %
Eosinophils Absolute: 0.1 10*3/uL (ref 0.0–0.5)
Eosinophils Relative: 1 %
HCT: 25 % — ABNORMAL LOW (ref 36.0–46.0)
Hemoglobin: 7.6 g/dL — ABNORMAL LOW (ref 12.0–15.0)
Immature Granulocytes: 1 %
Lymphocytes Relative: 15 %
Lymphs Abs: 1.4 10*3/uL (ref 0.7–4.0)
MCH: 26.6 pg (ref 26.0–34.0)
MCHC: 30.4 g/dL (ref 30.0–36.0)
MCV: 87.4 fL (ref 80.0–100.0)
Monocytes Absolute: 0.8 10*3/uL (ref 0.1–1.0)
Monocytes Relative: 8 %
Neutro Abs: 7 10*3/uL (ref 1.7–7.7)
Neutrophils Relative %: 75 %
Platelets: 318 10*3/uL (ref 150–400)
RBC: 2.86 MIL/uL — ABNORMAL LOW (ref 3.87–5.11)
RDW: 15.6 % — ABNORMAL HIGH (ref 11.5–15.5)
WBC: 9.4 10*3/uL (ref 4.0–10.5)
nRBC: 0 % (ref 0.0–0.2)

## 2020-03-01 LAB — GLUCOSE, CAPILLARY
Glucose-Capillary: 157 mg/dL — ABNORMAL HIGH (ref 70–99)
Glucose-Capillary: 287 mg/dL — ABNORMAL HIGH (ref 70–99)
Glucose-Capillary: 253 mg/dL — ABNORMAL HIGH (ref 70–99)
Glucose-Capillary: 178 mg/dL — ABNORMAL HIGH (ref 70–99)
Glucose-Capillary: 191 mg/dL — ABNORMAL HIGH (ref 70–99)

## 2020-03-01 LAB — IRON AND TIBC
Iron: 16 ug/dL — ABNORMAL LOW (ref 28–170)
Saturation Ratios: 8 % — ABNORMAL LOW (ref 10.4–31.8)
TIBC: 196 ug/dL — ABNORMAL LOW (ref 250–450)
UIBC: 180 ug/dL

## 2020-03-01 MED ORDER — FE FUMARATE-B12-VIT C-FA-IFC PO CAPS
1.0000 | ORAL_CAPSULE | Freq: Three times a day (TID) | ORAL | Status: DC
  Administered 2020-03-01 – 2020-03-04 (×11): 1 via ORAL
  Filled 2020-03-01 (×11): qty 1

## 2020-03-01 MED ORDER — INSULIN GLARGINE 100 UNIT/ML ~~LOC~~ SOLN
10.0000 [IU] | Freq: Every day | SUBCUTANEOUS | Status: DC
  Administered 2020-03-01 – 2020-03-04 (×4): 10 [IU] via SUBCUTANEOUS
  Filled 2020-03-01 (×4): qty 0.1

## 2020-03-01 NOTE — Progress Notes (Signed)
PROGRESS NOTE  Bianca [name] HGD:[id number] DOB: 31-Aug-1946 DOA: 02/25/2020 PCP: Julian Hy, PA-C  Brief History   Bianca [name] is a 74 y.o. female with medical history significant of CAD s/p CABG 02/08/20, HTN, DM2, HLD who presents for wound delitescence from her right leg vein harvest.  Daughter in room with the patient assists with the history.  Patient did well with surgery, returned home for home health PT.   Patient and daughter note that the incision opened 5-6 days ago.  Since that time they have noticed more swelling of the right leg, more pain in the leg and she has had decreased exercise tolerance.  She also has had diarrhea since given a stool softener after the surgery.  She did restart her metformin at full dose after the surgery.  Her daughter reports 3-4 stools per day, soft to watery.  Sometimes she sees undigested food in the stool.  It is worse after a metformin dose.  Started before taking keflex for wound dehiscence.  Further they note continued confusion since the hospital and needing reminders frequently.  Finally, noted to have white discharge from the distal part of the chest surgical wound.    [facility] were consulted to admit the patient for further evaluation and treatment. [facility] has been consulted for management of the wound.  The patient has intermittently been oppositional to wound care and administration of antibiotics, antihypertensives, etc. She seems to be much more agreeable with family at bedside. Wound vac to be placed today per TCTS. Consultants  . TCTS  Procedures  . Debridement . Wound vac placement  Antibiotics   Anti-infectives (From admission, onward)   Start     Dose/Rate Route Frequency Ordered Stop   02/25/20 1600  cephALEXin (KEFLEX) capsule 500 mg     500 mg Oral 3 times daily 02/25/20 1403       Subjective  The patient is resting quietly in bed. No new complaints.  Objective    Vitals:  Vitals:   03/01/20 0750 03/01/20 1205  BP: 118/69 107/60  Pulse: 89 (!) 117  Resp: (!) 24 (!) 24  Temp: 97.9 F (36.6 C) 98.8 F (37.1 C)  SpO2: 100% 100%    Exam:  Constitutional:  . The patient is awake, alert, and oriented x 3. No acute distress. . No increased work of breathing. . No wheezes, rales, or rhonchi . No tactile fremitus Cardiovascular:  . Regular rate and rhythm . No murmurs, ectopy, or gallups. . No lateral PMI. No thrills. Abdomen:  . Abdomen is soft, non-tender, non-distended . No hernias, masses, or organomegaly . Normoactive bowel sounds.  Musculoskeletal:  . No cyanosis, clubbing, or edema. Right lower extremity wound is bandaged. Skin:  . No rashes, lesions, ulcers . palpation of skin: no induration or nodules Neurologic:  . CN 2-12 intact . Sensation all 4 extremities intact Psychiatric:  . Mental status o Mood, affect appropriate o Orientation to person, place, time  . judgment and insight appear intact  I have personally reviewed the following:   Today's Data  . Vitals, CBC, BMP  Micro Data  . MRSA and staphylococcus negative PCR  Scheduled Meds: . amLODipine  5 mg Oral Daily  . aspirin  325 mg Oral Daily  . atorvastatin  80 mg Oral q1800  . carvedilol  25 mg Oral BID WC  . cephALEXin  500 mg Oral TID  . feeding supplement (ENSURE ENLIVE)  237 mL Oral  BID BM  . ferrous fumarate-b12-vitamic C-folic acid  1 capsule Oral TID PC  . heparin  5,000 Units Subcutaneous Q8H  . insulin aspart  0-5 Units Subcutaneous QHS  . insulin aspart  0-9 Units Subcutaneous TID WC  . losartan  50 mg Oral Daily  . potassium chloride SA  40 mEq Oral Daily  . sodium chloride flush  3 mL Intravenous Q12H   Continuous Infusions:  Active Problems:   Type 2 diabetes mellitus (HCC)   CHF (congestive heart failure) (HCC)   Essential hypertension   Hyperlipidemia   CAD (coronary artery disease)   Hypokalemia   S/P CABG x 4   Wound  dehiscence   Malnutrition of moderate degree   LOS: 5 days   A & P  Wound dehiscence/Unilateral Leg swelling: Wound is painful and draining. TCTS has been consulted to address the wound. It is anticipated that the patient will go for operative debridement and cultures by TCTS. There is also a small open area at the distal end of the sternal wound. The patient will receive oxycodone and morphine for severe pain only. Doppler of lower extremities was negative for DVT. Wound vac placement as per TCTS today.   CAD (coronary artery disease) S/P CABG x 4: The patient is receiving ASA 325 mg daily, lipitor 80mg  daily, Coreg 25 mg bid, and Cozaar 50 mg daily. The patient denies chest pain. She is being monitored on telemetry.  Agitation: Resolved. . Pt was oppositional yesterday refusing medications and wound care. Family and patient have also refused low dose seroquel. Family at bedside state that she is not refusing interventions today.  Diarrhea:  The timing does not appear to be related to antibiotic use. Metformin has been held. No signs of infection.   Anemia: Likely anemia of chronic disease. Will check iron studies. Iron supplementation by primary service. Transfuse for hemoglobin less than 7.0. Monitor.  Type 2 diabetes mellitus: Glucoses will be followed with FSBS and SSI. Metformin has been held. [phone]). Will add lantus.    Chronic diastolic CHF (congestive heart failure): Unilateral lower extremity swelling, no obvious signs of acute process. Continue lasix and potassium as at home.     Essential hypertension: Continue home BP medications, monitor for hypotension    Hyperlipidemia: Continue statin    Hypokalemia: Monitor and supplement potassium as necessary.  I have seen and examined this patient myself. I have spent 34 minutes in her evaluation and care.    DVT prophylaxis:      Heparin SQ  Code Status:              Full  Family Communication:       Daughter and Son at  bedside Disposition Plan: The patient is from home, anticipate discharge to home. Barrier to discharge: Need for surgical attention to dehisced wound with cultures and stabilization. Wound vac placement.    Rabecka Brendel, DO [facility] Direct contact: see [email or web address]  7PM-7AM contact night coverage as above 03/01/2020, 3:45 PM  LOS: 1 day

## 2020-03-01 NOTE — Progress Notes (Addendum)
  Subjective: Awake, alert, and cooperative this am.  York Spaniel she had some pain in the left lateral ankle earlier while in bed but this has resolved. No pain in the RLE.  Objective: Vital signs in last 24 hours: Temp:  [97.8 F (36.6 C)-98.5 F (36.9 C)] 97.9 F (36.6 C) (05/22 0750) Pulse Rate:  [81-90] 89 (05/22 0750) Cardiac Rhythm: Normal sinus rhythm;Bundle branch block;Heart block (05/21 2030) Resp:  [16-24] 24 (05/22 0750) BP: (102-118)/(55-73) 118/69 (05/22 0750) SpO2:  [96 %-100 %] 100 % (05/22 0750)   Intake/Output from previous day: 05/21 0701 - 05/22 0700 In: 240 [P.O.:240] Out: 50 [Drains:50] Intake/Output this shift: Total I/O In: 340 [P.O.:340] Out: -   General appearance: alert, cooperative and no distress Neurologic: No gross deficits Heart: SR Lungs: Breat sounds are clear. Good sats on RA. Extremities: Wound vac in place to right medial calf and functioning appropriately  Wound: The sternal wound remains approximated and is dry.  Lab Results: Recent Labs    02/29/20 0319 03/01/20 0314  WBC 12.5* 9.4  HGB 8.1* 7.6*  HCT 26.2* 25.0*  PLT 334 318   BMET:  Recent Labs    02/28/20 0242 03/01/20 0314  NA 138 139  K 3.9 4.4  CL 106 105  CO2 26 24  GLUCOSE 174* 188*  BUN 10 18  CREATININE 0.87 1.06*  CALCIUM 8.6* 8.5*    PT/INR: No results for input(s): LABPROT, INR in the last 72 hours. ABG    Component Value Date/Time   PHART 7.342 (L) 02/08/2020 2029   HCO3 21.7 02/08/2020 2029   TCO2 23 02/08/2020 2029   ACIDBASEDEF 4.0 (H) 02/08/2020 2029   O2SAT 99.0 02/08/2020 2029   CBG (last 3)  Recent Labs    02/29/20 1713 02/29/20 2147 03/01/20 0646  GLUCAP 135* 250* 157*    Assessment/Plan:  -Re-admission post CABG in April for RLE EVH incision dehiscence. Debridement again yesterday and wound vac in place. Plan vac change on Monday. Encouraging nutrition and ambulation. Continue oral Keflex. Appreciate PT eval. Pt has a rolling  walker at home.   -DM, Type2- continue SSI  -Anemia- suspect she is still recovering from surgical losses. Add Trinsicon.    LOS: 5 days    Leary Roca, [place] [phone] 03/01/2020

## 2020-03-02 LAB — CBC WITH DIFFERENTIAL/PLATELET
Abs Immature Granulocytes: 0.14 10*3/uL — ABNORMAL HIGH (ref 0.00–0.07)
Basophils Absolute: 0 10*3/uL (ref 0.0–0.1)
Basophils Relative: 0 %
Eosinophils Absolute: 0.1 10*3/uL (ref 0.0–0.5)
Eosinophils Relative: 1 %
HCT: 24 % — ABNORMAL LOW (ref 36.0–46.0)
Hemoglobin: 7.6 g/dL — ABNORMAL LOW (ref 12.0–15.0)
Immature Granulocytes: 1 %
Lymphocytes Relative: 15 %
Lymphs Abs: 1.6 10*3/uL (ref 0.7–4.0)
MCH: 27.6 pg (ref 26.0–34.0)
MCHC: 31.7 g/dL (ref 30.0–36.0)
MCV: 87.3 fL (ref 80.0–100.0)
Monocytes Absolute: 1 10*3/uL (ref 0.1–1.0)
Monocytes Relative: 9 %
Neutro Abs: 7.8 10*3/uL — ABNORMAL HIGH (ref 1.7–7.7)
Neutrophils Relative %: 74 %
Platelets: 333 10*3/uL (ref 150–400)
RBC: 2.75 MIL/uL — ABNORMAL LOW (ref 3.87–5.11)
RDW: 15.4 % (ref 11.5–15.5)
WBC: 10.7 10*3/uL — ABNORMAL HIGH (ref 4.0–10.5)
nRBC: 0 % (ref 0.0–0.2)

## 2020-03-02 LAB — GLUCOSE, CAPILLARY
Glucose-Capillary: 128 mg/dL — ABNORMAL HIGH (ref 70–99)
Glucose-Capillary: 191 mg/dL — ABNORMAL HIGH (ref 70–99)
Glucose-Capillary: 116 mg/dL — ABNORMAL HIGH (ref 70–99)
Glucose-Capillary: 117 mg/dL — ABNORMAL HIGH (ref 70–99)

## 2020-03-02 MED ORDER — GLUCERNA SHAKE PO LIQD
237.0000 mL | Freq: Two times a day (BID) | ORAL | Status: DC
  Administered 2020-03-02 – 2020-03-04 (×3): 237 mL via ORAL
  Filled 2020-03-02: qty 237

## 2020-03-02 MED ORDER — GLUCERNA SHAKE PO LIQD: 237.0000 mL | Freq: Two times a day (BID) | ORAL | Status: DC

## 2020-03-02 NOTE — Progress Notes (Signed)
PROGRESS NOTE  Bianca [name] VQM:[id number] DOB: 08/13/1946 DOA: 02/25/2020 PCP: Julian Hy, PA-C  Brief History   Bianca [name] is a 74 y.o. female with medical history significant of CAD s/p CABG 02/08/20, HTN, DM2, HLD who presents for wound delitescence from her right leg vein harvest.  Daughter in room with the patient assists with the history.  Patient did well with surgery, returned home for home health PT.   Patient and daughter note that the incision opened 5-6 days ago.  Since that time they have noticed more swelling of the right leg, more pain in the leg and she has had decreased exercise tolerance.  She also has had diarrhea since given a stool softener after the surgery.  She did restart her metformin at full dose after the surgery.  Her daughter reports 3-4 stools per day, soft to watery.  Sometimes she sees undigested food in the stool.  It is worse after a metformin dose.  Started before taking keflex for wound dehiscence.  Further they note continued confusion since the hospital and needing reminders frequently.  Finally, noted to have white discharge from the distal part of the chest surgical wound.    [facility] were consulted to admit the patient for further evaluation and treatment. [facility] has been consulted for management of the wound.  The patient has intermittently been oppositional to wound care and administration of antibiotics, antihypertensives, etc. She seems to be much more agreeable with family at bedside. Wound vac to be placed today per TCTS. Consultants  . TCTS  Procedures  . Debridement . Wound vac placement  Antibiotics   Anti-infectives (From admission, onward)   Start     Dose/Rate Route Frequency Ordered Stop   02/25/20 1600  cephALEXin (KEFLEX) capsule 500 mg     500 mg Oral 3 times daily 02/25/20 1403       Subjective  The patient is resting quietly in bed. No new complaints.  Objective    Vitals:  Vitals:   03/02/20 0820 03/02/20 1110  BP: (!) 112/58 (!) 118/54  Pulse: 86 85  Resp: 20 (!) 25  Temp: 98.1 F (36.7 C) 98.2 F (36.8 C)  SpO2: 100% 99%    Exam:  Constitutional:  . The patient is awake, alert, and oriented x 3. No acute distress. . No increased work of breathing. . No wheezes, rales, or rhonchi . No tactile fremitus Cardiovascular:  . Regular rate and rhythm . No murmurs, ectopy, or gallups. . No lateral PMI. No thrills. Abdomen:  . Abdomen is soft, non-tender, non-distended . No hernias, masses, or organomegaly . Normoactive bowel sounds.  Musculoskeletal:  . No cyanosis, clubbing, or edema. Right lower extremity wound is bandaged. Skin:  . No rashes, lesions, ulcers . palpation of skin: no induration or nodules Neurologic:  . CN 2-12 intact . Sensation all 4 extremities intact Psychiatric:  . Mental status o Mood, affect appropriate o Orientation to person, place, time  . judgment and insight appear intact  I have personally reviewed the following:   Today's Data  . Vitals, CBC, BMP  Micro Data  . MRSA and staphylococcus negative PCR  Scheduled Meds: . amLODipine  5 mg Oral Daily  . aspirin  325 mg Oral Daily  . atorvastatin  80 mg Oral q1800  . carvedilol  25 mg Oral BID WC  . cephALEXin  500 mg Oral TID  . feeding supplement (ENSURE ENLIVE)  237 mL Oral  BID BM  . ferrous fumarate-b12-vitamic C-folic acid  1 capsule Oral TID PC  . heparin  5,000 Units Subcutaneous Q8H  . insulin aspart  0-5 Units Subcutaneous QHS  . insulin aspart  0-9 Units Subcutaneous TID WC  . insulin glargine  10 Units Subcutaneous Daily  . losartan  50 mg Oral Daily  . potassium chloride SA  40 mEq Oral Daily  . sodium chloride flush  3 mL Intravenous Q12H   Continuous Infusions:  Active Problems:   Type 2 diabetes mellitus (HCC)   CHF (congestive heart failure) (HCC)   Essential hypertension   Hyperlipidemia   CAD (coronary artery  disease)   Hypokalemia   S/P CABG x 4   Wound dehiscence   Malnutrition of moderate degree   LOS: 6 days   A & P  Wound dehiscence/Unilateral Leg swelling: Wound is painful and draining. TCTS has been consulted to address the wound. It is anticipated that the patient will go for operative debridement and cultures by TCTS. There is also a small open area at the distal end of the sternal wound. The patient will receive oxycodone and morphine for severe pain only. Doppler of lower extremities was negative for DVT. Wound vac placement as per TCTS today.   CAD (coronary artery disease) S/P CABG x 4: The patient is receiving ASA 325 mg daily, lipitor 80mg  daily, Coreg 25 mg bid, and Cozaar 50 mg daily. The patient denies chest pain. She is being monitored on telemetry.  Agitation: Resolved. . Pt was oppositional yesterday refusing medications and wound care. Family and patient have also refused low dose seroquel. Family at bedside state that she is not refusing interventions today.  Diarrhea:  The timing does not appear to be related to antibiotic use. Metformin has been held. No signs of infection.   Anemia: Likely anemia of chronic disease. Will check iron studies. Iron supplementation by primary service. Transfuse for hemoglobin less than 7.0. Monitor.  Type 2 diabetes mellitus: Glucoses will be followed with FSBS and SSI. Metformin has been held. [phone]). Will add lantus.    Chronic diastolic CHF (congestive heart failure): Unilateral lower extremity swelling, no obvious signs of acute process. Continue lasix and potassium as at home.     Essential hypertension: Continue home BP medications, monitor for hypotension    Hyperlipidemia: Continue statin    Hypokalemia: Monitor and supplement potassium as necessary.  I have seen and examined this patient myself. I have spent 34 minutes in her evaluation and care.    DVT prophylaxis:      Heparin SQ  Code Status:              Full   Family Communication:       Daughter and Son at bedside Disposition Plan: The patient is from home, anticipate discharge to home with home health PT/OT and 24 hr supervision and assistance as recommended by PT. She will also need 5" rolling walker. Barrier to discharge: Need for surgical attention to dehisced wound with cultures and stabilization. Wound vac placement.    Dhalia Zingaro, DO [facility] Direct contact: see [email or web address]  7PM-7AM contact night coverage as above 03/02/2020, 12:58 PM  LOS: 1 day

## 2020-03-02 NOTE — Progress Notes (Signed)
      [address]       [place] [address]             [phone]       Subjective: Sitting up in bed with family at the bedside.  She has no new complaints or concerns.  She reports occasional pain in her right lower extremity adjacent to the wound that comes and goes. She remains afebrile.  Objective: Vital signs in last 24 hours: Temp:  [97.8 F (36.6 C)-99.4 F (37.4 C)] 97.8 F (36.6 C) (05/23 1625) Pulse Rate:  [78-93] 81 (05/23 1625) Cardiac Rhythm: Normal sinus rhythm;Bundle branch block;Junctional rhythm (05/23 0700) Resp:  [20-25] 21 (05/23 1625) BP: (111-141)/(52-62) 111/60 (05/23 1625) SpO2:  [99 %-100 %] 100 % (05/23 1625)    Intake/Output from previous day: 05/22 0701 - 05/23 0700 In: 340 [P.O.:340] Out: 50 [Drains:50] Intake/Output this shift: Total I/O In: -  Out: 300 [Urine:300]  General appearance: alert, cooperative and no distress Neurologic: No gross deficits Heart: SR Lungs: Breathsounds are clear. Good sats on RA. Extremities: Wound vac in place to right medial calf and functioning appropriately  Wound: The sternal wound remains approximated and is dry.  Lab Results: Recent Labs    03/01/20 0314 03/02/20 0405  WBC 9.4 10.7*  HGB 7.6* 7.6*  HCT 25.0* 24.0*  PLT 318 333   BMET:  Recent Labs    03/01/20 0314  NA 139  K 4.4  CL 105  CO2 24  GLUCOSE 188*  BUN 18  CREATININE 1.06*  CALCIUM 8.5*    PT/INR: No results for input(s): LABPROT, INR in the last 72 hours. ABG    Component Value Date/Time   PHART 7.342 (L) 02/08/2020 2029   HCO3 21.7 02/08/2020 2029   TCO2 23 02/08/2020 2029   ACIDBASEDEF 4.0 (H) 02/08/2020 2029   O2SAT 99.0 02/08/2020 2029   CBG (last 3)  Recent Labs    03/02/20 0635 03/02/20 1108 03/02/20 1637  GLUCAP 128* 191* 116*    Assessment/Plan:   -Re-admission post CABG in April for RLE EVH incision dehiscence. Debridement at the bedside on 5/21 and wound vac placed. Plan vac  change on tomorrow. Encouraging nutrition and ambulation. Continue oral Keflex. Appreciate PT eval. Pt has a rolling walker at home.   -DM, Type2- continue SSI  -Anemia- suspect she is still recovering from surgical losses. Continue Trinsicon.    LOS: 6 days    Leary Roca , [place] [phone] 03/02/2020

## 2020-03-02 NOTE — Progress Notes (Signed)
Physical Therapy Treatment Patient Details Name: Bianca [name] MRN: [id number] DOB: 1946-08-26 Today's Date: 03/02/2020    History of Present Illness 74 y.o. female with medical history significant of CAD s/p CABG 02/08/20, HTN, DM2, HLD who presents for wound delitescence from her right leg vein harvest. PT also experiencing 3-4 days of soft stool immediately prior to admission. Pt underwent wound vac placement to RLE on 5/21.    PT Comments    Pt remains limited in activity tolerance, reporting fatigue which significantly limits ambulation distance. Pt continues to require frequent cues for sternal precautions, having no recall of these precautions from previous visit or admission. Pt lacks LE power to stand without significant use of UEs, PT encourages pushing from knees in an attempt to maintain sternal precautions. Pt also with significant difficulty rocking to gain momentum and performing a forward lean to improve transfer quality. Pt will benefit from having family member present at next session to observe mobility and assistance requirements, and to be further educated on sternal precautions. PT updating recommendations to SNF as pt continues to require significant assistance to transfer and demonstrates no recall of precautions, placing her at a high risk for wound dehiscence.  Follow Up Recommendations  SNF(HHPT if family refuses placement)     Equipment Recommendations  Rolling walker with 5" wheels;3in1 (PT)(pt may already own DME)    Recommendations for Other Services       Precautions / Restrictions Precautions Precautions: Fall;Sternal Precaution Booklet Issued: No Precaution Comments: RLE wound vac, pt with no recall of sternal precautions Restrictions Weight Bearing Restrictions: Yes RUE Partial Weight Bearing Percentage or Pounds: Sternal Precautions LUE Partial Weight Bearing Percentage or Pounds: Sternal Precautions    Mobility  Bed Mobility Overal bed  mobility: Needs Assistance Bed Mobility: Supine to Sit     Supine to sit: Min assist        Transfers Overall transfer level: Needs assistance Equipment used: Rolling walker (2 wheeled) Transfers: Sit to/from Stand Sit to Stand: Mod assist         General transfer comment: pt requires frequent cues for hand placement to maintain sternal precautions. Pt requires significant assistance to power up to standing due to LE weakness  Ambulation/Gait Ambulation/Gait assistance: Min guard Gait Distance (Feet): 10 Feet(2 trials of 10' and 2 trials of 7') Assistive device: Rolling walker (2 wheeled) Gait Pattern/deviations: Step-to pattern Gait velocity: reduced Gait velocity interpretation: <1.8 ft/sec, indicate of risk for recurrent falls General Gait Details: pt requries cues to maintain RW closer to BOS. pt ambulation distances is significantly limited by reports of needing to sit due to fatigue   Stairs             Wheelchair Mobility    Modified Rankin (Stroke Patients Only)       Balance Overall balance assessment: Needs assistance Sitting-balance support: No upper extremity supported;Feet supported Sitting balance-Leahy Scale: Fair Sitting balance - Comments: close supervision   Standing balance support: Bilateral upper extremity supported Standing balance-Leahy Scale: Poor Standing balance comment: minA when attempting to perform hygiene tasks after urinating                            Cognition Arousal/Alertness: Awake/alert Behavior During Therapy: Flat affect Overall Cognitive Status: Impaired/Different from baseline Area of Impairment: Memory;Following commands;Safety/judgement;Awareness;Problem solving                     Memory: Decreased recall  of precautions;Decreased short-term memory Following Commands: Follows one step commands consistently Safety/Judgement: Decreased awareness of safety;Decreased awareness of  deficits Awareness: Intellectual Problem Solving: Slow processing General Comments: unable to recall sternal precautions, requires frequent cues. Forgets PT instructions within 30 seconds at times      Exercises      General Comments General comments (skin integrity, edema, etc.): pt with incontinence of urine during session, is able to report the urge but unable to control it for >30 seconds      Pertinent Vitals/Pain Pain Assessment: No/denies pain    Home Living                      Prior Function            PT Goals (current goals can now be found in the care plan section) Acute Rehab PT Goals Patient Stated Goal: to improve mobility and go home Progress towards PT goals: Progressing toward goals(slowly, increased activity tolerance)    Frequency    Min 3X/week      PT Plan Current plan remains appropriate    Co-evaluation              AM-PAC PT "6 Clicks" Mobility   Outcome Measure  Help needed turning from your back to your side while in a flat bed without using bedrails?: A Little Help needed moving from lying on your back to sitting on the side of a flat bed without using bedrails?: A Little Help needed moving to and from a bed to a chair (including a wheelchair)?: A Lot Help needed standing up from a chair using your arms (e.g., wheelchair or bedside chair)?: A Lot Help needed to walk in hospital room?: A Little Help needed climbing 3-5 steps with a railing? : Total 6 Click Score: 14    End of Session   Activity Tolerance: Patient limited by fatigue Patient left: in chair;with call bell/phone within reach;with family/visitor present Nurse Communication: Mobility status PT Visit Diagnosis: Muscle weakness (generalized) (M62.81);Other abnormalities of gait and mobility (R26.89)     Time: 0932-6712 PT Time Calculation (min) (ACUTE ONLY): 31 min  Charges:  $Gait Training: 8-22 mins $Therapeutic Activity: 8-22 mins                      Zenaida Niece, PT, DPT Acute Rehabilitation Pager: [phone]    Zenaida Niece 03/02/2020, 4:55 PM

## 2020-03-03 LAB — CBC
HCT: 25.4 % — ABNORMAL LOW (ref 36.0–46.0)
Hemoglobin: 7.6 g/dL — ABNORMAL LOW (ref 12.0–15.0)
MCH: 26.5 pg (ref 26.0–34.0)
MCHC: 29.9 g/dL — ABNORMAL LOW (ref 30.0–36.0)
MCV: 88.5 fL (ref 80.0–100.0)
Platelets: 366 10*3/uL (ref 150–400)
RBC: 2.87 MIL/uL — ABNORMAL LOW (ref 3.87–5.11)
RDW: 15.5 % (ref 11.5–15.5)
WBC: 9.7 10*3/uL (ref 4.0–10.5)
nRBC: 0 % (ref 0.0–0.2)

## 2020-03-03 LAB — GLUCOSE, CAPILLARY
Glucose-Capillary: 90 mg/dL (ref 70–99)
Glucose-Capillary: 121 mg/dL — ABNORMAL HIGH (ref 70–99)
Glucose-Capillary: 88 mg/dL (ref 70–99)
Glucose-Capillary: 181 mg/dL — ABNORMAL HIGH (ref 70–99)

## 2020-03-03 MED ORDER — ENOXAPARIN SODIUM 30 MG/0.3ML ~~LOC~~ SOLN
30.0000 mg | SUBCUTANEOUS | Status: DC
  Administered 2020-03-03: 30 mg via SUBCUTANEOUS
  Filled 2020-03-03: qty 0.3

## 2020-03-03 MED ORDER — SODIUM CHLORIDE 0.9 % IV SOLN
510.0000 mg | Freq: Once | INTRAVENOUS | Status: AC
  Administered 2020-03-03: 510 mg via INTRAVENOUS
  Filled 2020-03-03: qty 17

## 2020-03-03 MED ORDER — ACETAMINOPHEN 650 MG RE SUPP: 650.0000 mg | Freq: Four times a day (QID) | RECTAL | Status: DC

## 2020-03-03 MED ORDER — ACETAMINOPHEN 325 MG PO TABS
650.0000 mg | ORAL_TABLET | Freq: Four times a day (QID) | ORAL | Status: DC
  Administered 2020-03-03 – 2020-03-04 (×5): 650 mg via ORAL
  Filled 2020-03-03 (×5): qty 2

## 2020-03-03 NOTE — Progress Notes (Signed)
Mobility Specialist - Progress Note   03/03/20 1615  Mobility  Activity Ambulated in room  Level of Assistance Moderate assist, patient does 50-74%  Assistive Device Front wheel walker  Distance Ambulated (ft) 40 ft  Mobility performed by Mobility specialist  $Mobility charge 1 Mobility    Pre-mobility: 71 HR, 96% SpO2 Post-mobility: 76 HR, 94% SPO2  Pt needed mod assist when standing up. I assisted her to her bedside commode and then walked her to the door of her room and then to her chair at the end of her room. After sitting, she decided to she'd rather lay in her bed so I transferred her. I left her lying in her bed with the head and foot of the bed elevated to her preference. Her call bell and phone were at her side and the bed alarm was turned on.  Mamie Levers Mobility Specialist

## 2020-03-03 NOTE — Consult Note (Signed)
WOC Nurse Consult Note: Reason for Consult: Placement of NPWT to RLE full thickness wounds Wound type: Surgical Pressure Injury POA: N/A Measurement: 3.5cm x 4.5cm x 1.2cm Wound bed: Beefy red, small amount bleeding with old dressing removal Drainage (amount, consistency, odor) see above Periwound: Intact, dry Dressing procedure/placement/frequency: One piece of black foam removed, wound cleansed with NS, and patted dry. One piece of black foam used to fill defect, drape is used to cover. T.R.A.C.C. pad used to attach to continuous negative pressure. An immediate seal is achieved. Patient was premedicated prior to procedure and tolerated it well.  WOC nursing team will not follow, but will remain available to this patient, the nursing and medical teams.  Please re-consult if needed. Thanks, Ladona Mow, MSN, RN, GNP, [place]  Pager# [phone]

## 2020-03-03 NOTE — TOC Progression Note (Signed)
Transition of Care (TOC) - Progression Note  Donn Pierini RN, BSN Transitions of Care Unit 4E- RN Case Manager [phone]   Patient Details  Name: Bianca [name] MRN: [id number] Date of Birth: Jul 17, 1946  Transition of Care [facility]) CM/SW Contact  Zenda Alpers, Lenn Sink, RN Phone Number: 03/03/2020, 3:33 PM  Clinical Narrative:    F/u done for home wound VAC needs- WOC RN to change dressing today and provide wound measurements. Spoke with Bianca [name]. Regarding [employer] home wound VAC form needing signature- he will come to sign today- once signed will fax to [place] with [employer]- spoke with Bianca [name] who is waiting for form and will start insurance process for approval once form received. Once home VAC approved [employer] to deliver to bedside.  Spoke with son at the bedside who call sisterAmado [name] over TC-to speak via speaker regarding transition plans- plan is for pt to go to daughter's home in [place] at discharge- they have needed DME- RW and [facility]- daughter- Bianca [name] will provide transport home.  Address for Bianca [name]:  [address] [place] [address]      Per daughter they would like to stay with Bianca [name] for [facility] services- orders have been placed for HHRN/PT/aide- call made to [facility] with Bianca [name] -to confirm resumption of services and that they could service at daughters- [facility] has been confirmed with Bianca [name].    Expected Discharge Plan: Home w Home Health Services Barriers to Discharge: Continued Medical Work up  Expected Discharge Plan and Services Expected Discharge Plan: Home w Home Health Services   Discharge Planning Services: CM Consult Post Acute Care Choice: Home Health, Resumption of Svcs/PTA Provider Living arrangements for the past 2 months: Apartment                 DME Arranged: Vac DME Agency: [employer] Date DME Agency Contacted: 03/03/20 Time DME Agency Contacted: [phone] Representative spoke with at DME Agency: Bianca [name] HH Arranged: RN, PT, Nurse's Aide HH Agency: [facility]  Care Date [facility] Agency Contacted: 03/03/20 Time HH Agency Contacted: [phone] Representative spoke with at [facility] Agency: Kandee Keen   Social Determinants of Health (SDOH) Interventions    Readmission Risk Interventions Readmission Risk Prevention Plan 02/15/2020 02/14/2020  Transportation Screening Complete -  PCP or Specialist Appt within 5-7 Days Complete -  Home Care Screening Complete -  Medication Review (RN CM) Complete -  HRI or Home Care Consult - Complete  Social Work Consult for Recovery Care Planning/Counseling - Complete  Palliative Care Screening - Not Applicable  Some recent data might be hidden

## 2020-03-03 NOTE — Progress Notes (Signed)
PROGRESS NOTE  Bianca [name]:[id number] DOB: January 08, 1946 DOA: 02/25/2020 PCP: Julian Hy, PA-C  Brief History   Bianca [name] is a 74 y.o. female with medical history significant of CAD s/p CABG 02/08/20, HTN, DM2, HLD who presents for wound delitescence from her right leg vein harvest.  Daughter in room with the patient assists with the history.  Patient did well with surgery, returned home for home health PT.   Patient and daughter note that the incision opened 5-6 days ago.  Since that time they have noticed more swelling of the right leg, more pain in the leg and she has had decreased exercise tolerance.  She also has had diarrhea since given a stool softener after the surgery.  She did restart her metformin at full dose after the surgery.  Her daughter reports 3-4 stools per day, soft to watery.  Sometimes she sees undigested food in the stool.  It is worse after a metformin dose.  Started before taking keflex for wound dehiscence.  Further they note continued confusion since the hospital and needing reminders frequently.  Finally, noted to have white discharge from the distal part of the chest surgical wound.    [facility] were consulted to admit the patient for further evaluation and treatment. [facility] has been consulted for management of the wound.  The patient has intermittently been oppositional to wound care and administration of antibiotics, antihypertensives, etc. She seems to be much more agreeable with family at bedside. Wound vac to be placed today per TCTS.  Pt family is concerned about low hemoglobin and black stools. Hemoglobins have been stable within a range from 7.2 on 02/10/2020 to 8.4 on 02/27/2020 to 7.6 on 5/22-25/2021. She is receiving oral iron supplementation which is the likely cause of the dark stools. I have givent he patient a dose of feraheme today.  Consultants  . TCTS . Wound Care  Procedures   . Debridement . Wound vac placement  Antibiotics   Anti-infectives (From admission, onward)   Start     Dose/Rate Route Frequency Ordered Stop   02/25/20 1600  cephALEXin (KEFLEX) capsule 500 mg     500 mg Oral 3 times daily 02/25/20 1403       Subjective  The patient is resting quietly in bed. No new complaints.  Objective   Vitals:  Vitals:   03/03/20 0803 03/03/20 1202  BP: (!) 114/57 (!) 108/57  Pulse: 90 75  Resp: 18 20  Temp: 97.9 F (36.6 C) 98 F (36.7 C)  SpO2: 99% 99%    Exam:  Constitutional:  . The patient is awake, alert, and oriented x 3. No acute distress. . No increased work of breathing. . No wheezes, rales, or rhonchi . No tactile fremitus Cardiovascular:  . Regular rate and rhythm . No murmurs, ectopy, or gallups. . No lateral PMI. No thrills. Abdomen:  . Abdomen is soft, non-tender, non-distended . No hernias, masses, or organomegaly . Normoactive bowel sounds.  Musculoskeletal:  . No cyanosis, clubbing, or edema. Right lower extremity wound is bandaged. Skin:  . No rashes, lesions, ulcers . palpation of skin: no induration or nodules Neurologic:  . CN 2-12 intact . Sensation all 4 extremities intact Psychiatric:  . Mental status o Mood, affect appropriate o Orientation to person, place, time  . judgment and insight appear intact  I have personally reviewed the following:   Today's Data  . Vitals, CBC  Micro Data  . MRSA  and staphylococcus negative PCR  Scheduled Meds: . acetaminophen  650 mg Oral QID   Or  . acetaminophen  650 mg Rectal QID  . amLODipine  5 mg Oral Daily  . aspirin  325 mg Oral Daily  . atorvastatin  80 mg Oral q1800  . carvedilol  25 mg Oral BID WC  . cephALEXin  500 mg Oral TID  . [START ON 03/04/2020] enoxaparin (LOVENOX) injection  30 mg Subcutaneous Q24H  . feeding supplement (GLUCERNA SHAKE)  237 mL Oral BID BM  . ferrous fumarate-b12-vitamic C-folic acid  1 capsule Oral TID PC  . insulin aspart   0-5 Units Subcutaneous QHS  . insulin aspart  0-9 Units Subcutaneous TID WC  . insulin glargine  10 Units Subcutaneous Daily  . losartan  50 mg Oral Daily  . potassium chloride SA  40 mEq Oral Daily  . sodium chloride flush  3 mL Intravenous Q12H   Continuous Infusions: . ferumoxytol      Active Problems:   Type 2 diabetes mellitus (HCC)   CHF (congestive heart failure) (HCC)   Essential hypertension   Hyperlipidemia   CAD (coronary artery disease)   Hypokalemia   S/P CABG x 4   Wound dehiscence   Malnutrition of moderate degree   LOS: 7 days   A & P  Wound dehiscence/Unilateral Leg swelling: Wound is painful and draining. TCTS has been consulted to address the wound. It is anticipated that the patient will go for operative debridement and cultures by TCTS. There is also a small open area at the distal end of the sternal wound. The patient will receive oxycodone and morphine for severe pain only. Doppler of lower extremities was negative for DVT. Wound vac placement as per TCTS today.   CAD (coronary artery disease) S/P CABG x 4: The patient is receiving ASA 325 mg daily, lipitor 80mg  daily, Coreg 25 mg bid, and Cozaar 50 mg daily. The patient denies chest pain. She is being monitored on telemetry.  Agitation: Resolved. . Pt was oppositional yesterday refusing medications and wound care. Family and patient have also refused low dose seroquel. Family at bedside state that she is not refusing interventions today.  Diarrhea:  The timing does not appear to be related to antibiotic use. Metformin has been held. No signs of infection.   Anemia: Likely anemia of chronic disease. Iron studied demonstrate low iron stores. Iron supplementation by primary service. Pt family is concerned about low hemoglobin and black stools. Hemoglobins have been stable within a range from 7.2 on 02/10/2020 to 8.4 on 02/27/2020 to 7.6 on 5/22-25/2021. She is receiving oral iron supplementation which is the  likely cause of the dark stools. I have givent he patient a dose of feraheme today. Transfuse for hemoglobin less than 7.0. Monitor.  Type 2 diabetes mellitus: Glucoses will be followed with FSBS and SSI. She is also receiving Lantus 10 units sub Q daily. Metformin has been held. Glucoses have been (90-121) in the last 24 hours.    Chronic diastolic CHF (congestive heart failure): Unilateral lower extremity swelling, no obvious signs of acute process. Continue lasix and potassium as at home.     Essential hypertension: Continue home BP medications, monitor for hypotension    Hyperlipidemia: Continue statin    Hypokalemia: Resolved. Monitor and supplement potassium as necessary.  I have seen and examined this patient myself. I have spent 36 minutes in her evaluation and care.    DVT prophylaxis:  Heparin SQ  Code Status:              Full  Family Communication:       Daughter and Son at bedside Disposition Plan: The patient is from home, anticipate discharge to home with home health PT/OT and 24 hr supervision and assistance as recommended by PT. She will also need 5" rolling walker. Barrier to discharge: Need for surgical attention to dehisced wound with cultures and stabilization.   Turon Kilmer, DO [facility] Direct contact: see [email or web address]  7PM-7AM contact night coverage as above 03/02/2020, 12:58 PM  LOS: 1 day

## 2020-03-03 NOTE — Progress Notes (Signed)
Physical Therapy Treatment Patient Details Name: Bianca [name] MRN: [id number] DOB: [birth year] Today's Date: 03/03/2020    History of Present Illness 74 y.o. female with medical history significant of CAD s/p CABG 02/08/20, HTN, DM2, HLD who presents for wound delitescence from her right leg vein harvest. PT also experiencing 3-4 days of soft stool immediately prior to admission. Pt underwent wound vac placement to RLE on 5/21.    PT Comments    Pt making steady progress. Daughter present and confirms that family can assist at dc.    Follow Up Recommendations  Home health PT;Supervision/Assistance - 24 hour     Equipment Recommendations  None recommended by PT    Recommendations for Other Services       Precautions / Restrictions Precautions Precautions: Fall;Sternal Precaution Booklet Issued: No Precaution Comments: RLE wound vac, pt with no recall of sternal precautions    Mobility  Bed Mobility Overal bed mobility: Needs Assistance Bed Mobility: Supine to Sit     Supine to sit: Min assist     General bed mobility comments: Assist to elevate trunk into sitting and to bring hips to EOB  Transfers Overall transfer level: Needs assistance Equipment used: Rolling walker (2 wheeled) Transfers: Sit to/from Stand Sit to Stand: Min assist         General transfer comment: Assist to bring hips up. Verbal cues for hands on knees. Used rocking momentum.  Ambulation/Gait Ambulation/Gait assistance: Min guard Gait Distance (Feet): 75 Feet(75' x 1, 50' x 1) Assistive device: Rolling walker (2 wheeled) Gait Pattern/deviations: Trunk flexed;Step-through pattern;Decreased step length - right;Decreased step length - left Gait velocity: decr Gait velocity interpretation: <1.31 ft/sec, indicative of household ambulator General Gait Details: Verbal cues to stay closer to walker and stand more erect   Stairs             Wheelchair Mobility    Modified Rankin  (Stroke Patients Only)       Balance Overall balance assessment: Needs assistance Sitting-balance support: No upper extremity supported;Feet supported Sitting balance-Leahy Scale: Fair     Standing balance support: Bilateral upper extremity supported Standing balance-Leahy Scale: Poor Standing balance comment: walker and min guard for static standing                            Cognition Arousal/Alertness: Awake/alert Behavior During Therapy: Flat affect Overall Cognitive Status: Impaired/Different from baseline Area of Impairment: Memory;Following commands;Safety/judgement;Awareness;Problem solving                     Memory: Decreased recall of precautions;Decreased short-term memory Following Commands: Follows one step commands consistently Safety/Judgement: Decreased awareness of safety;Decreased awareness of deficits Awareness: Intellectual Problem Solving: Slow processing General Comments: Verbal cues for sternal precautions      Exercises      General Comments General comments (skin integrity, edema, etc.): VSS      Pertinent Vitals/Pain Pain Assessment: No/denies pain    Home Living                      Prior Function            PT Goals (current goals can now be found in the care plan section) Acute Rehab PT Goals Patient Stated Goal: return home Progress towards PT goals: Progressing toward goals    Frequency    Min 3X/week      PT Plan Discharge plan needs to  be updated    Co-evaluation              AM-PAC PT "6 Clicks" Mobility   Outcome Measure  Help needed turning from your back to your side while in a flat bed without using bedrails?: A Little Help needed moving from lying on your back to sitting on the side of a flat bed without using bedrails?: A Little Help needed moving to and from a bed to a chair (including a wheelchair)?: A Little Help needed standing up from a chair using your arms (e.g.,  wheelchair or bedside chair)?: A Little Help needed to walk in hospital room?: A Little Help needed climbing 3-5 steps with a railing? : Total 6 Click Score: 16    End of Session Equipment Utilized During Treatment: Gait belt Activity Tolerance: Patient tolerated treatment well Patient left: in chair;with call bell/phone within reach;with family/visitor present Nurse Communication: Mobility status PT Visit Diagnosis: Muscle weakness (generalized) (M62.81);Other abnormalities of gait and mobility (R26.89)     Time: 6579-0383 PT Time Calculation (min) (ACUTE ONLY): 27 min  Charges:  $Gait Training: 23-37 mins                     [facility] PT Acute Rehabilitation Services Pager [phone] Office [phone]    [name] [facility] 03/03/2020, 11:53 AM

## 2020-03-03 NOTE — Progress Notes (Signed)
      [address]       [place] [address]             [phone]         Subjective: Feels ok  Objective: Vital signs in last 24 hours: Temp:  [97.8 F (36.6 C)-98.2 F (36.8 C)] 98 F (36.7 C) (05/24 1202) Pulse Rate:  [79-90] 90 (05/24 0803) Cardiac Rhythm: Normal sinus rhythm;Bundle branch block;Heart block (05/24 0712) Resp:  [14-21] 18 (05/24 0803) BP: (101-114)/(54-63) 108/57 (05/24 1202) SpO2:  [99 %-100 %] 99 % (05/24 0803)  Hemodynamic parameters for last 24 hours:    Intake/Output from previous day: 05/23 0701 - 05/24 0700 In: 122 [P.O.:122] Out: 875 [Urine:800; Drains:75] Intake/Output this shift: Total I/O In: 240 [P.O.:240] Out: -   General appearance: alert, cooperative and no distress Heart: regular rate and rhythm Lungs: clear to auscultation bilaterally Wound: VAC in place, no erethema  Lab Results: Recent Labs    03/01/20 0314 03/02/20 0405  WBC 9.4 10.7*  HGB 7.6* 7.6*  HCT 25.0* 24.0*  PLT 318 333   BMET:  Recent Labs    03/01/20 0314  NA 139  K 4.4  CL 105  CO2 24  GLUCOSE 188*  BUN 18  CREATININE 1.06*  CALCIUM 8.5*    PT/INR: No results for input(s): LABPROT, INR in the last 72 hours. ABG    Component Value Date/Time   PHART 7.342 (L) 02/08/2020 2029   HCO3 21.7 02/08/2020 2029   TCO2 23 02/08/2020 2029   ACIDBASEDEF 4.0 (H) 02/08/2020 2029   O2SAT 99.0 02/08/2020 2029   CBG (last 3)  Recent Labs    03/02/20 1637 03/02/20 2144 03/03/20 0613  GLUCAP 116* 117* 90    Meds Scheduled Meds: . amLODipine  5 mg Oral Daily  . aspirin  325 mg Oral Daily  . atorvastatin  80 mg Oral q1800  . carvedilol  25 mg Oral BID WC  . cephALEXin  500 mg Oral TID  . feeding supplement (GLUCERNA SHAKE)  237 mL Oral BID BM  . ferrous fumarate-b12-vitamic C-folic acid  1 capsule Oral TID PC  . heparin  5,000 Units Subcutaneous Q8H  . insulin aspart  0-5 Units Subcutaneous QHS  . insulin aspart  0-9 Units  Subcutaneous TID WC  . insulin glargine  10 Units Subcutaneous Daily  . losartan  50 mg Oral Daily  . potassium chloride SA  40 mEq Oral Daily  . sodium chloride flush  3 mL Intravenous Q12H   Continuous Infusions: PRN Meds:.acetaminophen **OR** acetaminophen, bismuth subsalicylate, calcium carbonate, LORazepam, morphine injection, oxyCODONE  Xrays No results found.  Assessment/Plan:  1 will ask wound care to assist with [facility] management 2 afeb, VSS 3 family is concerned with black stools, she is having slow drop in Hct but is on Iron- will recheck HCT, change to lovenox from sub q heparin 4 will ask TOC for assist with hom earrangements, poss d/c  tomorrow     LOS: 7 days    Rowe Clack PA-C Pager [phone] 03/03/2020

## 2020-03-04 LAB — HEMOGLOBIN AND HEMATOCRIT, BLOOD
HCT: 25.3 % — ABNORMAL LOW (ref 36.0–46.0)
Hemoglobin: 7.8 g/dL — ABNORMAL LOW (ref 12.0–15.0)

## 2020-03-04 LAB — GLUCOSE, CAPILLARY
Glucose-Capillary: 112 mg/dL — ABNORMAL HIGH (ref 70–99)
Glucose-Capillary: 121 mg/dL — ABNORMAL HIGH (ref 70–99)

## 2020-03-04 MED ORDER — ONDANSETRON HCL 4 MG/2ML IJ SOLN
4.0000 mg | Freq: Once | INTRAMUSCULAR | Status: AC
  Administered 2020-03-04: 4 mg via INTRAVENOUS
  Filled 2020-03-04: qty 2

## 2020-03-04 MED ORDER — INSULIN GLARGINE 100 UNIT/ML ~~LOC~~ SOLN: 10.0000 [IU] | Freq: Every day | SUBCUTANEOUS | 11 refills | Status: DC

## 2020-03-04 MED ORDER — GLUCERNA SHAKE PO LIQD: 237.0000 mL | Freq: Two times a day (BID) | ORAL | 0 refills | Status: DC

## 2020-03-04 MED ORDER — CALCIUM CARBONATE ANTACID 500 MG PO CHEW: 1.0000 | CHEWABLE_TABLET | Freq: Every day | ORAL | Status: DC | PRN

## 2020-03-04 MED ORDER — ACETAMINOPHEN 325 MG PO TABS: 650.0000 mg | ORAL_TABLET | Freq: Four times a day (QID) | ORAL | Status: DC

## 2020-03-04 MED ORDER — FUROSEMIDE 40 MG PO TABS: 20.0000 mg | ORAL_TABLET | Freq: Every day | ORAL | 0 refills | Status: DC

## 2020-03-04 MED ORDER — POTASSIUM CHLORIDE CRYS ER 20 MEQ PO TBCR: 20.0000 meq | EXTENDED_RELEASE_TABLET | Freq: Every day | ORAL | 0 refills | Status: DC

## 2020-03-04 MED ORDER — FE FUMARATE-B12-VIT C-FA-IFC PO CAPS: 1.0000 | ORAL_CAPSULE | Freq: Three times a day (TID) | ORAL | 0 refills | Status: DC

## 2020-03-04 NOTE — Progress Notes (Addendum)
      [address]       [place] [address]             [phone]         Subjective: Wound conts to heal well with VAC  Objective: Vital signs in last 24 hours: Temp:  [97.8 F (36.6 C)-98.4 F (36.9 C)] 97.9 F (36.6 C) (05/25 0845) Pulse Rate:  [73-83] 82 (05/25 0314) Cardiac Rhythm: Normal sinus rhythm;Bundle branch block;Heart block (05/25 0710) Resp:  [18-25] 25 (05/25 0314) BP: (102-128)/(54-64) 115/58 (05/25 0845) SpO2:  [95 %-100 %] 95 % (05/25 0845)  Hemodynamic parameters for last 24 hours:    Intake/Output from previous day: 05/24 0701 - 05/25 0700 In: 597 [P.O.:480; IV Piggyback:117] Out: 680 [Urine:600; Drains:80] Intake/Output this shift: No intake/output data recorded.  General appearance: alert, cooperative and no distress Heart: regular rate and rhythm Lungs: clear to auscultation bilaterally Abdomen: benign Extremities: no edema Wound: VAC in place, no erethema   Lab Results: Recent Labs    03/02/20 0405 03/02/20 0405 03/03/20 1247 03/04/20 0335  WBC 10.7*  --  9.7  --   HGB 7.6*   < > 7.6* 7.8*  HCT 24.0*   < > 25.4* 25.3*  PLT 333  --  366  --    < > = values in this interval not displayed.   BMET: No results for input(s): NA, K, CL, CO2, GLUCOSE, BUN, CREATININE, CALCIUM in the last 72 hours.  PT/INR: No results for input(s): LABPROT, INR in the last 72 hours. ABG    Component Value Date/Time   PHART 7.342 (L) 02/08/2020 2029   HCO3 21.7 02/08/2020 2029   TCO2 23 02/08/2020 2029   ACIDBASEDEF 4.0 (H) 02/08/2020 2029   O2SAT 99.0 02/08/2020 2029   CBG (last 3)  Recent Labs    03/03/20 1643 03/03/20 2113 03/04/20 0628  GLUCAP 88 181* 112*    Meds Scheduled Meds: . acetaminophen  650 mg Oral QID   Or  . acetaminophen  650 mg Rectal QID  . amLODipine  5 mg Oral Daily  . aspirin  325 mg Oral Daily  . atorvastatin  80 mg Oral q1800  . carvedilol  25 mg Oral BID WC  . cephALEXin  500 mg Oral TID  .  enoxaparin (LOVENOX) injection  30 mg Subcutaneous Q24H  . feeding supplement (GLUCERNA SHAKE)  237 mL Oral BID BM  . ferrous fumarate-b12-vitamic C-folic acid  1 capsule Oral TID PC  . insulin aspart  0-5 Units Subcutaneous QHS  . insulin aspart  0-9 Units Subcutaneous TID WC  . insulin glargine  10 Units Subcutaneous Daily  . losartan  50 mg Oral Daily  . potassium chloride SA  40 mEq Oral Daily  . sodium chloride flush  3 mL Intravenous Q12H   Continuous Infusions: PRN Meds:.bismuth subsalicylate, calcium carbonate, LORazepam, morphine injection, oxyCODONE  Xrays No results found.  Assessment/Plan:  1 conts to be stable with wound improving, cont VAC at home, change q 3 days- home arrangements appear to be in place. SW assisting 2 stable from surgical perspective for D/C 3 family requests not using metformin with side effect of diarhhea 4 other plans as per medicine service    LOS: 8 days    Rowe Clack PA-C Pager [phone] 03/04/2020   Doing well dispo planning, home today  Corliss Skains

## 2020-03-04 NOTE — Progress Notes (Signed)
Discharge instructions provided to patient and daughter. All medications, follow up appointments, and discharge instructions provided. Wound vac care reviewed. IV out. Monitor off CCMD notified. Patient discharging home with daughter.  Ginette Otto, RN

## 2020-03-04 NOTE — Progress Notes (Signed)
PROGRESS NOTE  Bianca GOOGE DPO:[id number] DOB: 07-Nov-1945 DOA: 02/25/2020 PCP: Julian Hy, PA-C  Brief History   Bianca [name] is a 74 y.o. female with medical history significant of CAD s/p CABG 02/08/20, HTN, DM2, HLD who presents for wound delitescence from her right leg vein harvest.  Daughter in room with the patient assists with the history.  Patient did well with surgery, returned home for home health PT.   Patient and daughter note that the incision opened 5-6 days ago.  Since that time they have noticed more swelling of the right leg, more pain in the leg and she has had decreased exercise tolerance.  She also has had diarrhea since given a stool softener after the surgery.  She did restart her metformin at full dose after the surgery.  Her daughter reports 3-4 stools per day, soft to watery.  Sometimes she sees undigested food in the stool.  It is worse after a metformin dose.  Started before taking keflex for wound dehiscence.  Further they note continued confusion since the hospital and needing reminders frequently.  Finally, noted to have white discharge from the distal part of the chest surgical wound.    [facility] were consulted to admit the patient for further evaluation and treatment. [facility] has been consulted for management of the wound.  The patient has intermittently been oppositional to wound care and administration of antibiotics, antihypertensives, etc. She seems to be much more agreeable with family at bedside. Wound vac to be placed today per TCTS.  Pt family is concerned about low hemoglobin and black stools. Hemoglobins have been stable within a range from 7.2 on 02/10/2020 to 8.4 on 02/27/2020 to 7.6 on 5/22-25/2021. She is receiving oral iron supplementation which is the likely cause of the dark stools. I have given the patient a dose of feraheme on 03/03/2020.  She is medically cleared for discharge. Consultants  .  TCTS . Wound Care  Procedures  . Debridement . Wound vac placement  Antibiotics   Anti-infectives (From admission, onward)   Start     Dose/Rate Route Frequency Ordered Stop   02/25/20 1600  cephALEXin (KEFLEX) capsule 500 mg     500 mg Oral 3 times daily 02/25/20 1403       Subjective  The patient is resting quietly in bed. No new complaints.  Objective   Vitals:  Vitals:   03/04/20 0900 03/04/20 1117  BP:    Pulse: 87   Resp: (!) 25   Temp:  97.7 F (36.5 C)  SpO2:      Exam:  Constitutional:  . The patient is awake, alert, and oriented x 3. No acute distress. . No increased work of breathing. . No wheezes, rales, or rhonchi . No tactile fremitus Cardiovascular:  . Regular rate and rhythm . No murmurs, ectopy, or gallups. . No lateral PMI. No thrills. Abdomen:  . Abdomen is soft, non-tender, non-distended . No hernias, masses, or organomegaly . Normoactive bowel sounds.  Musculoskeletal:  . No cyanosis, clubbing, or edema. Right lower extremity wound is bandaged. Skin:  . No rashes, lesions, ulcers . palpation of skin: no induration or nodules Neurologic:  . CN 2-12 intact . Sensation all 4 extremities intact Psychiatric:  . Mental status o Mood, affect appropriate o Orientation to person, place, time  . judgment and insight appear intact  I have personally reviewed the following:   Today's Data  . Vitals, CBC  Micro Data  .  MRSA and staphylococcus negative PCR  Scheduled Meds: . acetaminophen  650 mg Oral QID   Or  . acetaminophen  650 mg Rectal QID  . amLODipine  5 mg Oral Daily  . aspirin  325 mg Oral Daily  . atorvastatin  80 mg Oral q1800  . carvedilol  25 mg Oral BID WC  . cephALEXin  500 mg Oral TID  . enoxaparin (LOVENOX) injection  30 mg Subcutaneous Q24H  . feeding supplement (GLUCERNA SHAKE)  237 mL Oral BID BM  . ferrous fumarate-b12-vitamic C-folic acid  1 capsule Oral TID PC  . insulin aspart  0-5 Units Subcutaneous QHS   . insulin aspart  0-9 Units Subcutaneous TID WC  . insulin glargine  10 Units Subcutaneous Daily  . losartan  50 mg Oral Daily  . potassium chloride SA  40 mEq Oral Daily  . sodium chloride flush  3 mL Intravenous Q12H   Continuous Infusions:   Active Problems:   Type 2 diabetes mellitus (HCC)   CHF (congestive heart failure) (HCC)   Essential hypertension   Hyperlipidemia   CAD (coronary artery disease)   Hypokalemia   S/P CABG x 4   Wound dehiscence   Malnutrition of moderate degree   LOS: 8 days   A & P  Wound dehiscence/Unilateral Leg swelling: Wound is painful and draining. TCTS has been consulted to address the wound. It is anticipated that the patient will go for operative debridement and cultures by TCTS. There is also a small open area at the distal end of the sternal wound. The patient will receive oxycodone and morphine for severe pain only. Doppler of lower extremities was negative for DVT. Wound vac placement as per TCTS today.   CAD (coronary artery disease) S/P CABG x 4: The patient is receiving ASA 325 mg daily, lipitor 80mg  daily, Coreg 25 mg bid, and Cozaar 50 mg daily. The patient denies chest pain. She is being monitored on telemetry.  Agitation: Resolved. . Pt was oppositional yesterday refusing medications and wound care. Family and patient have also refused low dose seroquel. Family at bedside state that she is not refusing interventions today.  Diarrhea:  The timing does not appear to be related to antibiotic use. Metformin has been held. No signs of infection.   Anemia: Likely anemia of chronic disease. Iron studied demonstrate low iron stores. Iron supplementation by primary service. Pt family is concerned about low hemoglobin and black stools. Hemoglobins have been stable within a range from 7.2 on 02/10/2020 to 8.4 on 02/27/2020 to 7.6 on 5/22-25/2021. She is receiving oral iron supplementation which is the likely cause of the dark stools. I have givent he  patient a dose of feraheme today. Transfuse for hemoglobin less than 7.0. Monitor.  Type 2 diabetes mellitus: Glucoses will be followed with FSBS and SSI. She is also receiving Lantus 10 units sub Q daily. Metformin has been held. Glucoses have been (90-121) in the last 24 hours.    Chronic diastolic CHF (congestive heart failure): Unilateral lower extremity swelling, no obvious signs of acute process. Continue lasix and potassium as at home.     Essential hypertension: Continue home BP medications, monitor for hypotension    Hyperlipidemia: Continue statin    Hypokalemia: Resolved. Monitor and supplement potassium as necessary.  I have seen and examined this patient myself. I have spent 32 minutes in her evaluation and care.    DVT prophylaxis:      Heparin SQ  Code Status:  Full  Family Communication:       Daughter at bedside Disposition Plan: The patient is from home, anticipate discharge to home with home health PT/OT and 24 hr supervision and assistance as recommended by PT. She will also need 5" rolling walker. Barrier to discharge: The patient is medically and surgically cleared for discharge. Discharged medication reconciliation has been addressed. Ava Swayze, DO [facility] Direct contact: see [email or web address]  7PM-7AM contact night coverage as above 03/04/2020, 12:45 PM  LOS: 1 day

## 2020-03-04 NOTE — Care Management Important Message (Signed)
Important Message  Patient Details  Name: Bianca [name] MRN: [id number] Date of Birth: 1946-07-12   Medicare Important Message Given:  Yes     Renie Ora 03/04/2020, 12:11 PM

## 2020-03-04 NOTE — TOC Transition Note (Signed)
Transition of Care [facility]) - CM/SW Discharge Note Donn Pierini RN, BSN Transitions of Care Unit 4E- RN Case Manager [phone]   Patient Details  Name: Bianca [name] MRN: [id number] Date of Birth: 10/31/1945  Transition of Care [facility]) CM/SW Contact:  Darrold Span, RN Phone Number: 03/04/2020, 12:48 PM   Clinical Narrative:    Pt stable for transition home today, have confirmed with French Ana at [facility]- pt has been approved for home wound VAC and it should be delivered today after lunchtime. HH has been confirmed with [facility]- and Cory notified of discharge today.  Daughter here to transport home.    Final next level of care: Home w Home Health Services Barriers to Discharge: No Barriers Identified, Barriers Resolved   Patient Goals and CMS Choice Patient states their goals for this hospitalization and ongoing recovery are:: return home CMS Medicare.gov Compare Post Acute Care list provided to:: Patient Represenative (must comment) Choice offered to / list presented to : Patient, Adult Children  Discharge Placement                 Home with [facility]      Discharge Plan and Services   Discharge Planning Services: CM Consult Post Acute Care Choice: Home Health, Resumption of Svcs/PTA Provider          DME Arranged: Vac DME Agency: [facility] Date DME Agency Contacted: 03/03/20 Time DME Agency Contacted: 1345 Representative spoke with at DME Agency: French Ana HH Arranged: RN, PT, Nurse's Aide HH Agency: [facility] Date [facility] Agency Contacted: 03/03/20 Time HH Agency Contacted: 1320 Representative spoke with at [facility] Agency: Kandee Keen  Social Determinants of Health (SDOH) Interventions     Readmission Risk Interventions Readmission Risk Prevention Plan 03/04/2020 02/15/2020 02/14/2020  Transportation Screening Complete Complete -  PCP or Specialist Appt within 5-7 Days Complete Complete -  Home Care Screening Complete Complete -  Medication Review (RN CM) Complete Complete -   HRI or Home Care Consult - - Complete  Social Work Consult for Recovery Care Planning/Counseling - - Complete  Palliative Care Screening - - Not Applicable  Some recent data might be hidden

## 2020-03-04 NOTE — Progress Notes (Signed)
Physical Therapy Treatment Patient Details Name: Bianca [name] MRN: [id number] DOB: 13-Sep-1946 Today's Date: 03/04/2020    History of Present Illness 74 y.o. female with medical history significant of CAD s/p CABG 02/08/20, HTN, DM2, HLD who presents for wound delitescence from her right leg vein harvest. PT also experiencing 3-4 days of soft stool immediately prior to admission. Pt underwent wound vac placement to RLE on 5/21.    PT Comments    Pt making steady progress. Family very supportive and able to assist pt at home.    Follow Up Recommendations  Home health PT;Supervision/Assistance - 24 hour     Equipment Recommendations  None recommended by PT    Recommendations for Other Services       Precautions / Restrictions Precautions Precautions: Fall;Sternal Precaution Booklet Issued: No Precaution Comments: RLE wound vac, cues for sternal precautions    Mobility  Bed Mobility               General bed mobility comments: pt up in chair  Transfers Overall transfer level: Needs assistance Equipment used: Rolling walker (2 wheeled) Transfers: Sit to/from Stand Sit to Stand: Min assist         General transfer comment: Assist to bring hips up. Verbal cues for hands on knees. Used rocking momentum.  Ambulation/Gait Ambulation/Gait assistance: Min guard Gait Distance (Feet): 75 Feet Assistive device: Rolling walker (2 wheeled) Gait Pattern/deviations: Trunk flexed;Step-through pattern;Decreased step length - right;Decreased step length - left Gait velocity: decr Gait velocity interpretation: <1.31 ft/sec, indicative of household ambulator General Gait Details: Verbal cues to stay closer to walker and stand more erect   Stairs             Wheelchair Mobility    Modified Rankin (Stroke Patients Only)       Balance Overall balance assessment: Needs assistance Sitting-balance support: No upper extremity supported;Feet supported Sitting  balance-Leahy Scale: Fair     Standing balance support: Bilateral upper extremity supported Standing balance-Leahy Scale: Poor Standing balance comment: walker and min guard for static standing                            Cognition Arousal/Alertness: Awake/alert Behavior During Therapy: Flat affect Overall Cognitive Status: Impaired/Different from baseline Area of Impairment: Memory;Following commands;Safety/judgement;Awareness;Problem solving                     Memory: Decreased recall of precautions;Decreased short-term memory Following Commands: Follows one step commands consistently Safety/Judgement: Decreased awareness of deficits Awareness: Intellectual Problem Solving: Slow processing General Comments: Verbal cues for sternal precautions      Exercises      General Comments General comments (skin integrity, edema, etc.): VSS      Pertinent Vitals/Pain Pain Assessment: No/denies pain    Home Living                      Prior Function            PT Goals (current goals can now be found in the care plan section) Acute Rehab PT Goals Patient Stated Goal: return home Progress towards PT goals: Progressing toward goals    Frequency    Min 3X/week      PT Plan Current plan remains appropriate    Co-evaluation              AM-PAC PT "6 Clicks" Mobility   Outcome Measure  Help  needed turning from your back to your side while in a flat bed without using bedrails?: A Little Help needed moving from lying on your back to sitting on the side of a flat bed without using bedrails?: A Little Help needed moving to and from a bed to a chair (including a wheelchair)?: A Little Help needed standing up from a chair using your arms (e.g., wheelchair or bedside chair)?: A Little Help needed to walk in hospital room?: A Little Help needed climbing 3-5 steps with a railing? : Total 6 Click Score: 16    End of Session Equipment  Utilized During Treatment: Gait belt Activity Tolerance: Patient tolerated treatment well Patient left: in chair;with call bell/phone within reach;with family/visitor present   PT Visit Diagnosis: Muscle weakness (generalized) (M62.81);Other abnormalities of gait and mobility (R26.89)     Time: 7121-9758 PT Time Calculation (min) (ACUTE ONLY): 17 min  Charges:  $Gait Training: 8-22 mins                     [place] Pager [phone] Office [place] 03/04/2020, 10:01 AM

## 2020-03-05 ENCOUNTER — Encounter: Payer: Self-pay | Admitting: *Deleted

## 2020-03-05 DIAGNOSIS — E119 Type 2 diabetes mellitus without complications: Secondary | ICD-10-CM | POA: Diagnosis not present

## 2020-03-05 DIAGNOSIS — I110 Hypertensive heart disease with heart failure: Secondary | ICD-10-CM | POA: Diagnosis not present

## 2020-03-05 DIAGNOSIS — Z48812 Encounter for surgical aftercare following surgery on the circulatory system: Secondary | ICD-10-CM | POA: Diagnosis not present

## 2020-03-05 DIAGNOSIS — I25110 Atherosclerotic heart disease of native coronary artery with unstable angina pectoris: Secondary | ICD-10-CM | POA: Diagnosis not present

## 2020-03-05 DIAGNOSIS — I5030 Unspecified diastolic (congestive) heart failure: Secondary | ICD-10-CM | POA: Diagnosis not present

## 2020-03-05 DIAGNOSIS — I214 Non-ST elevation (NSTEMI) myocardial infarction: Secondary | ICD-10-CM | POA: Diagnosis not present

## 2020-03-05 NOTE — Progress Notes (Signed)
Ms. Patricks's daughter called with concerns that her mother's Lantus insulin that was presribed on discharge yesterday needs PA.  Upon review, I found this med to be ordered by the [facility] and the PA was faxed to their office.  I told her this was a process and may take a day or two.  In the meantime she says her mother's blood sugar this morning was 89.  I said to keep a log of her twice a day readings.  I would contact her PCP for further instructions if the insulin issue is not reconciled.  She agreed.

## 2020-03-06 ENCOUNTER — Encounter: Payer: Self-pay | Admitting: Thoracic Surgery (Cardiothoracic Vascular Surgery)

## 2020-03-06 ENCOUNTER — Telehealth: Payer: Self-pay

## 2020-03-06 DIAGNOSIS — I5030 Unspecified diastolic (congestive) heart failure: Secondary | ICD-10-CM | POA: Diagnosis not present

## 2020-03-06 DIAGNOSIS — I25110 Atherosclerotic heart disease of native coronary artery with unstable angina pectoris: Secondary | ICD-10-CM | POA: Diagnosis not present

## 2020-03-06 DIAGNOSIS — E119 Type 2 diabetes mellitus without complications: Secondary | ICD-10-CM | POA: Diagnosis not present

## 2020-03-06 DIAGNOSIS — Z48812 Encounter for surgical aftercare following surgery on the circulatory system: Secondary | ICD-10-CM | POA: Diagnosis not present

## 2020-03-06 DIAGNOSIS — I214 Non-ST elevation (NSTEMI) myocardial infarction: Secondary | ICD-10-CM | POA: Diagnosis not present

## 2020-03-06 DIAGNOSIS — I110 Hypertensive heart disease with heart failure: Secondary | ICD-10-CM | POA: Diagnosis not present

## 2020-03-06 NOTE — Telephone Encounter (Signed)
Lafonda Mosses, RN with [facility] contacted the office, [phone] to advised patient's base wound looks, "gray".  She stated just to make [place] aware.  Patient has a follow-up wound check with Dr. Cliffton Asters tomorrow 03/05/20.  She also stated that the patient did not have any appetite.  She is not eating much.  She asked if her PCP could be contact.  Advised patient's PCP can be contacted.

## 2020-03-07 ENCOUNTER — Ambulatory Visit: Payer: Self-pay | Admitting: Thoracic Surgery (Cardiothoracic Vascular Surgery)

## 2020-03-07 ENCOUNTER — Other Ambulatory Visit: Payer: Self-pay

## 2020-03-07 ENCOUNTER — Ambulatory Visit (INDEPENDENT_AMBULATORY_CARE_PROVIDER_SITE_OTHER): Payer: Self-pay | Admitting: Thoracic Surgery (Cardiothoracic Vascular Surgery)

## 2020-03-07 ENCOUNTER — Encounter: Payer: Self-pay | Admitting: Thoracic Surgery (Cardiothoracic Vascular Surgery)

## 2020-03-07 VITALS — BP 95/61 | HR 98 | Temp 97.9°F | Resp 16 | Ht 66.0 in | Wt 141.8 lb

## 2020-03-07 DIAGNOSIS — Z951 Presence of aortocoronary bypass graft: Secondary | ICD-10-CM

## 2020-03-07 NOTE — Progress Notes (Signed)
     [address]       [place] [address]             [phone]       Ms. Friend comes in for wound check.  She continues to do poorly at home in regards to ambulation, energy and eating.  The daughter is considering getting her into a nursing facility.  Today's Vitals   03/07/20 1053 03/07/20 1055  BP: (!) 79/49 95/61  Pulse: 98   Resp: 16   Temp: 97.9 F (36.6 C)   TempSrc: Temporal   SpO2: 93%   Weight: 141 lb 12.8 oz (64.3 kg)   Height: 5\' 6"  (1.676 m)    Body mass index is 22.89 kg/m.  The wound VAC was changed in clinic today. There is a good granulation base.  The wound measures 5 x 3 cm and is approximately 2 cm deep.  Proteinaceous exudate was debrided from the wound, and the wound VAC was reapplied.  74 year old female status post CABG with a right lower extremity wound at the vein harvest site.  Is not thriving well at home. We will see the patient in 2 weeks for another wound check.

## 2020-03-09 ENCOUNTER — Encounter (HOSPITAL_COMMUNITY): Payer: Self-pay | Admitting: Emergency Medicine

## 2020-03-09 ENCOUNTER — Other Ambulatory Visit: Payer: Self-pay

## 2020-03-09 ENCOUNTER — Inpatient Hospital Stay (HOSPITAL_COMMUNITY)
Admission: EM | Admit: 2020-03-09 | Discharge: 2020-03-25 | DRG: 862 | Disposition: A | Discharge status: Hospice | Payer: Medicare Other | Attending: Student | Admitting: Student

## 2020-03-09 ENCOUNTER — Emergency Department (HOSPITAL_COMMUNITY): Payer: Medicare Other

## 2020-03-09 DIAGNOSIS — Z66 Do not resuscitate: Secondary | ICD-10-CM

## 2020-03-09 DIAGNOSIS — K5900 Constipation, unspecified: Secondary | ICD-10-CM | POA: Diagnosis present

## 2020-03-09 DIAGNOSIS — Z87891 Personal history of nicotine dependence: Secondary | ICD-10-CM

## 2020-03-09 DIAGNOSIS — R439 Unspecified disturbances of smell and taste: Secondary | ICD-10-CM

## 2020-03-09 DIAGNOSIS — E119 Type 2 diabetes mellitus without complications: Secondary | ICD-10-CM

## 2020-03-09 DIAGNOSIS — F509 Eating disorder, unspecified: Secondary | ICD-10-CM

## 2020-03-09 DIAGNOSIS — R531 Weakness: Secondary | ICD-10-CM | POA: Diagnosis not present

## 2020-03-09 DIAGNOSIS — T8149XA Infection following a procedure, other surgical site, initial encounter: Principal | ICD-10-CM | POA: Diagnosis present

## 2020-03-09 DIAGNOSIS — I9589 Other hypotension: Secondary | ICD-10-CM | POA: Diagnosis not present

## 2020-03-09 DIAGNOSIS — R627 Adult failure to thrive: Secondary | ICD-10-CM | POA: Diagnosis present

## 2020-03-09 DIAGNOSIS — R609 Edema, unspecified: Secondary | ICD-10-CM | POA: Diagnosis not present

## 2020-03-09 DIAGNOSIS — N179 Acute kidney failure, unspecified: Secondary | ICD-10-CM | POA: Diagnosis not present

## 2020-03-09 DIAGNOSIS — Z7982 Long term (current) use of aspirin: Secondary | ICD-10-CM

## 2020-03-09 DIAGNOSIS — Z7189 Other specified counseling: Secondary | ICD-10-CM

## 2020-03-09 DIAGNOSIS — F329 Major depressive disorder, single episode, unspecified: Secondary | ICD-10-CM | POA: Diagnosis present

## 2020-03-09 DIAGNOSIS — E43 Unspecified severe protein-calorie malnutrition: Secondary | ICD-10-CM | POA: Insufficient documentation

## 2020-03-09 DIAGNOSIS — Z20822 Contact with and (suspected) exposure to covid-19: Secondary | ICD-10-CM | POA: Diagnosis present

## 2020-03-09 DIAGNOSIS — I4510 Unspecified right bundle-branch block: Secondary | ICD-10-CM | POA: Diagnosis not present

## 2020-03-09 DIAGNOSIS — I639 Cerebral infarction, unspecified: Secondary | ICD-10-CM | POA: Diagnosis present

## 2020-03-09 DIAGNOSIS — E1165 Type 2 diabetes mellitus with hyperglycemia: Secondary | ICD-10-CM | POA: Diagnosis present

## 2020-03-09 DIAGNOSIS — R0689 Other abnormalities of breathing: Secondary | ICD-10-CM | POA: Diagnosis not present

## 2020-03-09 DIAGNOSIS — J90 Pleural effusion, not elsewhere classified: Secondary | ICD-10-CM | POA: Diagnosis not present

## 2020-03-09 DIAGNOSIS — L8990 Pressure ulcer of unspecified site, unspecified stage: Secondary | ICD-10-CM | POA: Insufficient documentation

## 2020-03-09 DIAGNOSIS — I959 Hypotension, unspecified: Secondary | ICD-10-CM | POA: Diagnosis not present

## 2020-03-09 DIAGNOSIS — I2510 Atherosclerotic heart disease of native coronary artery without angina pectoris: Secondary | ICD-10-CM | POA: Diagnosis present

## 2020-03-09 DIAGNOSIS — Z885 Allergy status to narcotic agent status: Secondary | ICD-10-CM

## 2020-03-09 DIAGNOSIS — I110 Hypertensive heart disease with heart failure: Secondary | ICD-10-CM | POA: Diagnosis present

## 2020-03-09 DIAGNOSIS — E861 Hypovolemia: Secondary | ICD-10-CM | POA: Diagnosis present

## 2020-03-09 DIAGNOSIS — E785 Hyperlipidemia, unspecified: Secondary | ICD-10-CM | POA: Diagnosis present

## 2020-03-09 DIAGNOSIS — I952 Hypotension due to drugs: Secondary | ICD-10-CM | POA: Diagnosis not present

## 2020-03-09 DIAGNOSIS — R1110 Vomiting, unspecified: Secondary | ICD-10-CM | POA: Diagnosis not present

## 2020-03-09 DIAGNOSIS — I493 Ventricular premature depolarization: Secondary | ICD-10-CM | POA: Diagnosis present

## 2020-03-09 DIAGNOSIS — Z8249 Family history of ischemic heart disease and other diseases of the circulatory system: Secondary | ICD-10-CM

## 2020-03-09 DIAGNOSIS — Z794 Long term (current) use of insulin: Secondary | ICD-10-CM

## 2020-03-09 DIAGNOSIS — I6330 Cerebral infarction due to thrombosis of unspecified cerebral artery: Secondary | ICD-10-CM | POA: Insufficient documentation

## 2020-03-09 DIAGNOSIS — E1151 Type 2 diabetes mellitus with diabetic peripheral angiopathy without gangrene: Secondary | ICD-10-CM | POA: Diagnosis present

## 2020-03-09 DIAGNOSIS — E860 Dehydration: Secondary | ICD-10-CM | POA: Diagnosis present

## 2020-03-09 DIAGNOSIS — Z951 Presence of aortocoronary bypass graft: Secondary | ICD-10-CM

## 2020-03-09 DIAGNOSIS — D72829 Elevated white blood cell count, unspecified: Secondary | ICD-10-CM

## 2020-03-09 DIAGNOSIS — I5023 Acute on chronic systolic (congestive) heart failure: Secondary | ICD-10-CM | POA: Diagnosis present

## 2020-03-09 DIAGNOSIS — I10 Essential (primary) hypertension: Secondary | ICD-10-CM | POA: Diagnosis present

## 2020-03-09 DIAGNOSIS — R400 Somnolence: Secondary | ICD-10-CM

## 2020-03-09 DIAGNOSIS — R339 Retention of urine, unspecified: Secondary | ICD-10-CM | POA: Diagnosis present

## 2020-03-09 DIAGNOSIS — T8130XA Disruption of wound, unspecified, initial encounter: Secondary | ICD-10-CM | POA: Diagnosis present

## 2020-03-09 DIAGNOSIS — Z515 Encounter for palliative care: Secondary | ICD-10-CM

## 2020-03-09 DIAGNOSIS — R5383 Other fatigue: Secondary | ICD-10-CM | POA: Diagnosis not present

## 2020-03-09 DIAGNOSIS — Z833 Family history of diabetes mellitus: Secondary | ICD-10-CM

## 2020-03-09 DIAGNOSIS — Z6827 Body mass index (BMI) 27.0-27.9, adult: Secondary | ICD-10-CM

## 2020-03-09 DIAGNOSIS — Z79899 Other long term (current) drug therapy: Secondary | ICD-10-CM

## 2020-03-09 DIAGNOSIS — K5730 Diverticulosis of large intestine without perforation or abscess without bleeding: Secondary | ICD-10-CM | POA: Diagnosis not present

## 2020-03-09 LAB — COMPREHENSIVE METABOLIC PANEL
ALT: 17 U/L (ref 0–44)
AST: 23 U/L (ref 15–41)
Albumin: 2.2 g/dL — ABNORMAL LOW (ref 3.5–5.0)
Alkaline Phosphatase: 136 U/L — ABNORMAL HIGH (ref 38–126)
Anion gap: 14 (ref 5–15)
BUN: 43 mg/dL — ABNORMAL HIGH (ref 8–23)
CO2: 23 mmol/L (ref 22–32)
Calcium: 8.4 mg/dL — ABNORMAL LOW (ref 8.9–10.3)
Chloride: 97 mmol/L — ABNORMAL LOW (ref 98–111)
Creatinine, Ser: 1.68 mg/dL — ABNORMAL HIGH (ref 0.44–1.00)
GFR calc Af Amer: 34 mL/min — ABNORMAL LOW (ref >60)
GFR calc non Af Amer: 29 mL/min — ABNORMAL LOW (ref >60)
Glucose, Bld: 178 mg/dL — ABNORMAL HIGH (ref 70–99)
Potassium: 4.4 mmol/L (ref 3.5–5.1)
Sodium: 134 mmol/L — ABNORMAL LOW (ref 135–145)
Total Bilirubin: 0.5 mg/dL (ref 0.3–1.2)
Total Protein: 6 g/dL — ABNORMAL LOW (ref 6.5–8.1)

## 2020-03-09 LAB — CBC WITH DIFFERENTIAL/PLATELET
Abs Immature Granulocytes: 0 10*3/uL (ref 0.00–0.07)
Basophils Absolute: 0 10*3/uL (ref 0.0–0.1)
Basophils Relative: 0 %
Eosinophils Absolute: 0 10*3/uL (ref 0.0–0.5)
Eosinophils Relative: 0 %
HCT: 25.1 % — ABNORMAL LOW (ref 36.0–46.0)
Hemoglobin: 7.9 g/dL — ABNORMAL LOW (ref 12.0–15.0)
Lymphocytes Relative: 1 %
Lymphs Abs: 0.3 10*3/uL — ABNORMAL LOW (ref 0.7–4.0)
MCH: 27.1 pg (ref 26.0–34.0)
MCHC: 31.5 g/dL (ref 30.0–36.0)
MCV: 86.3 fL (ref 80.0–100.0)
Monocytes Absolute: 0.3 10*3/uL (ref 0.1–1.0)
Monocytes Relative: 1 %
Neutro Abs: 24.6 10*3/uL — ABNORMAL HIGH (ref 1.7–7.7)
Neutrophils Relative %: 98 %
Platelets: 349 10*3/uL (ref 150–400)
RBC: 2.91 MIL/uL — ABNORMAL LOW (ref 3.87–5.11)
RDW: 16.3 % — ABNORMAL HIGH (ref 11.5–15.5)
WBC: 25.1 10*3/uL — ABNORMAL HIGH (ref 4.0–10.5)
nRBC: 0 /100 WBC
nRBC: 0.1 % (ref 0.0–0.2)

## 2020-03-09 LAB — URINALYSIS, ROUTINE W REFLEX MICROSCOPIC
Bacteria, UA: NONE SEEN
Bilirubin Urine: NEGATIVE
Glucose, UA: NEGATIVE mg/dL
Hgb urine dipstick: NEGATIVE
Ketones, ur: NEGATIVE mg/dL
Leukocytes,Ua: NEGATIVE
Nitrite: NEGATIVE
Protein, ur: 100 mg/dL — AB
Specific Gravity, Urine: 1.02 (ref 1.005–1.030)
pH: 5 (ref 5.0–8.0)

## 2020-03-09 LAB — GLUCOSE, CAPILLARY
Glucose-Capillary: 148 mg/dL — ABNORMAL HIGH (ref 70–99)
Glucose-Capillary: 120 mg/dL — ABNORMAL HIGH (ref 70–99)

## 2020-03-09 LAB — PROTIME-INR
INR: 1.3 — ABNORMAL HIGH (ref 0.8–1.2)
Prothrombin Time: 15.3 seconds — ABNORMAL HIGH (ref 11.4–15.2)

## 2020-03-09 LAB — LACTIC ACID, PLASMA: Lactic Acid, Venous: 1.3 mmol/L (ref 0.5–1.9)

## 2020-03-09 LAB — CBG MONITORING, ED: Glucose-Capillary: 181 mg/dL — ABNORMAL HIGH (ref 70–99)

## 2020-03-09 LAB — APTT: aPTT: 29 seconds (ref 24–36)

## 2020-03-09 LAB — BRAIN NATRIURETIC PEPTIDE: B Natriuretic Peptide: 504.3 pg/mL — ABNORMAL HIGH (ref 0.0–100.0)

## 2020-03-09 MED ORDER — LORATADINE 10 MG PO TABS
10.0000 mg | ORAL_TABLET | Freq: Every day | ORAL | Status: DC
  Administered 2020-03-09 – 2020-03-23 (×14): 10 mg via ORAL
  Filled 2020-03-09 (×17): qty 1

## 2020-03-09 MED ORDER — ONDANSETRON HCL 4 MG PO TABS: 4.0000 mg | ORAL_TABLET | Freq: Four times a day (QID) | ORAL | Status: DC | PRN

## 2020-03-09 MED ORDER — ENOXAPARIN SODIUM 30 MG/0.3ML ~~LOC~~ SOLN
30.0000 mg | Freq: Every day | SUBCUTANEOUS | Status: DC
  Administered 2020-03-09: 30 mg via SUBCUTANEOUS
  Filled 2020-03-09 (×3): qty 0.3

## 2020-03-09 MED ORDER — INSULIN GLARGINE 100 UNIT/ML ~~LOC~~ SOLN
10.0000 [IU] | Freq: Every day | SUBCUTANEOUS | Status: DC
  Administered 2020-03-09 – 2020-03-20 (×11): 10 [IU] via SUBCUTANEOUS
  Filled 2020-03-09 (×12): qty 0.1

## 2020-03-09 MED ORDER — BISACODYL 5 MG PO TBEC
5.0000 mg | DELAYED_RELEASE_TABLET | Freq: Once | ORAL | Status: AC
  Administered 2020-03-09: 5 mg via ORAL
  Filled 2020-03-09: qty 1

## 2020-03-09 MED ORDER — SODIUM CHLORIDE 0.9 % IV BOLUS (SEPSIS)
500.0000 mL | Freq: Once | INTRAVENOUS | Status: AC
  Administered 2020-03-09: 500 mL via INTRAVENOUS

## 2020-03-09 MED ORDER — POLYETHYLENE GLYCOL 3350 17 G PO PACK
17.0000 g | PACK | Freq: Every day | ORAL | Status: DC
  Administered 2020-03-11 – 2020-03-23 (×8): 17 g via ORAL
  Filled 2020-03-09 (×10): qty 1

## 2020-03-09 MED ORDER — DOCUSATE SODIUM 100 MG PO CAPS
100.0000 mg | ORAL_CAPSULE | Freq: Two times a day (BID) | ORAL | Status: DC
  Administered 2020-03-11 – 2020-03-23 (×16): 100 mg via ORAL
  Filled 2020-03-09 (×23): qty 1

## 2020-03-09 MED ORDER — LACTATED RINGERS IV SOLN: INTRAVENOUS | Status: DC

## 2020-03-09 MED ORDER — ASPIRIN EC 325 MG PO TBEC
325.0000 mg | DELAYED_RELEASE_TABLET | Freq: Every day | ORAL | Status: DC
  Administered 2020-03-09 – 2020-03-18 (×9): 325 mg via ORAL
  Filled 2020-03-09 (×11): qty 1

## 2020-03-09 MED ORDER — CHLORHEXIDINE GLUCONATE CLOTH 2 % EX PADS
6.0000 | MEDICATED_PAD | Freq: Every day | CUTANEOUS | Status: DC
  Administered 2020-03-09 – 2020-03-22 (×10): 6 via TOPICAL

## 2020-03-09 MED ORDER — ACETAMINOPHEN 325 MG PO TABS
650.0000 mg | ORAL_TABLET | Freq: Once | ORAL | Status: AC
  Administered 2020-03-09: 650 mg via ORAL
  Filled 2020-03-09: qty 2

## 2020-03-09 MED ORDER — HYDRALAZINE HCL 20 MG/ML IJ SOLN: 5.0000 mg | INTRAMUSCULAR | Status: DC | PRN

## 2020-03-09 MED ORDER — INSULIN ASPART 100 UNIT/ML ~~LOC~~ SOLN: 0.0000 [IU] | Freq: Every day | SUBCUTANEOUS | Status: DC

## 2020-03-09 MED ORDER — ACETAMINOPHEN 325 MG PO TABS
650.0000 mg | ORAL_TABLET | Freq: Four times a day (QID) | ORAL | Status: DC
  Administered 2020-03-09 – 2020-03-24 (×47): 650 mg via ORAL
  Filled 2020-03-09 (×59): qty 2

## 2020-03-09 MED ORDER — ONDANSETRON HCL 4 MG/2ML IJ SOLN
4.0000 mg | Freq: Four times a day (QID) | INTRAMUSCULAR | Status: DC | PRN
  Administered 2020-03-22: 4 mg via INTRAVENOUS
  Filled 2020-03-09: qty 2

## 2020-03-09 MED ORDER — SODIUM CHLORIDE 0.9 % IV SOLN
1000.0000 mL | INTRAVENOUS | Status: DC
  Administered 2020-03-09: 1000 mL via INTRAVENOUS

## 2020-03-09 MED ORDER — IBUPROFEN 400 MG PO TABS
400.0000 mg | ORAL_TABLET | Freq: Two times a day (BID) | ORAL | Status: DC
  Administered 2020-03-09: 400 mg via ORAL
  Filled 2020-03-09: qty 1

## 2020-03-09 MED ORDER — ATORVASTATIN CALCIUM 80 MG PO TABS
80.0000 mg | ORAL_TABLET | Freq: Every day | ORAL | Status: DC
  Administered 2020-03-09 – 2020-03-23 (×14): 80 mg via ORAL
  Filled 2020-03-09 (×17): qty 1

## 2020-03-09 MED ORDER — INSULIN ASPART 100 UNIT/ML ~~LOC~~ SOLN
0.0000 [IU] | Freq: Three times a day (TID) | SUBCUTANEOUS | Status: DC
  Administered 2020-03-09: 1 [IU] via SUBCUTANEOUS
  Administered 2020-03-11: 3 [IU] via SUBCUTANEOUS
  Administered 2020-03-11 (×2): 2 [IU] via SUBCUTANEOUS
  Administered 2020-03-12: 3 [IU] via SUBCUTANEOUS
  Administered 2020-03-12: 2 [IU] via SUBCUTANEOUS
  Administered 2020-03-12: 3 [IU] via SUBCUTANEOUS
  Administered 2020-03-13: 5 [IU] via SUBCUTANEOUS
  Administered 2020-03-13 (×2): 2 [IU] via SUBCUTANEOUS
  Administered 2020-03-14: 1 [IU] via SUBCUTANEOUS
  Administered 2020-03-15 (×2): 2 [IU] via SUBCUTANEOUS
  Administered 2020-03-16 (×2): 1 [IU] via SUBCUTANEOUS
  Administered 2020-03-17: 2 [IU] via SUBCUTANEOUS
  Administered 2020-03-19 – 2020-03-21 (×2): 1 [IU] via SUBCUTANEOUS

## 2020-03-09 MED ORDER — SODIUM CHLORIDE 0.9% FLUSH
3.0000 mL | Freq: Two times a day (BID) | INTRAVENOUS | Status: DC
  Administered 2020-03-10 – 2020-03-24 (×19): 3 mL via INTRAVENOUS

## 2020-03-09 NOTE — Progress Notes (Signed)
Assisted pt up to [facility] with max assist of 2, pt was very weak. She had a larger amount of softer BM on the BSC. Converted home VAC to hospital VAC at 125 without difficulty.

## 2020-03-09 NOTE — ED Triage Notes (Addendum)
Pt coming from home after daughter called EMS due to pt's increase in lethargy since the 28th. Pt 1 month post CABG, and has wound vac placed on R leg. Recent medication changes and antibiotics stopped by pt's MD this week per EMS. Hx of DM, anemia, DM. Pt hypotensive initially @ 86/50, pt received 500cc bolus and pressure increased to 103/45. RR 35, 97% on RA. No temp reported by EMS or daughter who has been checking it frequently. A&O x4, but slow to respond. Vomited several times today and been struggling with constiapation

## 2020-03-09 NOTE — ED Notes (Signed)
Lunch Tray Ordered @ 1104.  

## 2020-03-09 NOTE — Progress Notes (Signed)
Pt has a mid chest post CABG incision which is 14 cm long. There is one area that is 3 cm long x 1.0 cm wide which has opened slightly.  I cleansed the incision line with sterile saline. There is no drainage noted from the whole incision.  Daughter states that the lower area of the incision had some drainage but the opened area had not been there.

## 2020-03-09 NOTE — Progress Notes (Signed)
Pt received from ED via stretcher with RLE wound vac on at 125 ( home unit).  This VAC dressing was changed on Friday at the drs office.  Started her on cardiac monitoring and verified with second verifier.  Son at bedside.  Sacral foam placed for stage one sacral area.  Pt has foley that was placed in the ED due to urinary retention.

## 2020-03-09 NOTE — ED Notes (Signed)
Pt taken to CT.

## 2020-03-09 NOTE — Progress Notes (Signed)
Pt had 1 hard BM ball after sitting on the bedpan and I could feel that there was at least 1 more but pt did not want me to disimpact her or give her a tap water enema.  Pt had been on iron at home and the BM was dark. Abd is soft and non-tender at present.  Daughter is at bedside and we discussed all aspects of her care.

## 2020-03-09 NOTE — ED Provider Notes (Signed)
[facility] EMERGENCY DEPARTMENT Provider Note  CSN: [id number] Arrival date & time: 03/09/20 0200  Chief Complaint(s) No chief complaint on file.  HPI Bianca [name] is a 74 y.o. female   HPI   CC: generalized fatigue  Onset/Duration: 2 days Timing: constant  Severity: moderate to severe Modifying Factors:  Improved by: nothing  Worsened by: activity Associated Signs/Symptoms:  Pertinent (+): decreased appetite, constipation, n/v. decreased oral tolerance. Low BP at home (80/40) [Normal BPs from 120s-140s].  Pertinent (-): fever, chills, chest pain, sob, abd pain, dysuria Context:  S/p CABG x4 on 8/25 complicated by wound dehiscence of EVH of right leg requiring wound VAC. She was taken off of Keflex during last admission for wound debridement. Lasix was decreased as well.    Past Medical History Past Medical History:  Diagnosis Date  . CAD (coronary artery disease) 01/31/2020   2 v dz  . Diabetes mellitus without complication ([place])   . Dyspnea   . Hypertension   . Hypertensive urgency 01/06/2020  . NSTEMI (non-ST elevated myocardial infarction) ([place]) 01/06/2020   in setting of HTN urgency and pulm edema   Patient Active Problem List   Diagnosis Date Noted  . Malnutrition of moderate degree 02/29/2020  . Wound dehiscence 02/25/2020  . S/P CABG x 4 02/08/2020  . Coronary artery disease 02/08/2020  . Chest pain 02/03/2020  . Unstable angina ([place]) 02/03/2020  . Hypokalemia 02/03/2020  . CAD (coronary artery disease) 01/31/2020  . Pain due to onychomycosis of toenails of both feet 01/30/2020  . Chest pain of uncertain etiology 05/39/7673  . Non-STEMI (non-ST elevated myocardial infarction) ([place]) 01/29/2020  . Essential hypertension 01/29/2020  . Hyperlipidemia 01/29/2020  . Acute diastolic CHF (congestive heart failure) ([place]) 01/06/2020  . CHF (congestive heart failure) ([place]) 01/06/2020  . Noncompliance with medication regimen 08/17/2018  .  Noncompliance with diagnostic testing 08/17/2018  . Type 2 diabetes mellitus ([place]) 04/17/2008  . TOBACCO ABUSE 04/17/2008   Home Medication(s) Prior to Admission medications   Medication Sig Start Date End Date Taking? Authorizing Provider  acetaminophen (TYLENOL) 325 MG tablet Take 650 mg by mouth every 6 (six) hours.    [provider]  amLODipine (NORVASC) 5 MG tablet Take 5 mg by mouth daily.    [provider]  aspirin EC 325 MG EC tablet Take 1 tablet (325 mg total) by mouth daily. 02/16/20   Gold, Wayne E, PA-C  atorvastatin (LIPITOR) 80 MG tablet Take 1 tablet (80 mg total) by mouth daily. 02/01/20   Bhagat, Crista Luria, PA  bismuth subsalicylate (PEPTO BISMOL) 262 MG/15ML suspension Take 30 mLs by mouth every 6 (six) hours as needed for indigestion.    [provider]  Blood Pressure Monitoring (ADULT BLOOD PRESSURE CUFF LG) KIT Use to check blood pressure twice a day, keep a running log of your blood pressures 01/31/15   Linton Flemings, MD  calcium carbonate (TUMS - DOSED IN MG ELEMENTAL CALCIUM) 500 MG chewable tablet Chew 1 tablet (200 mg of elemental calcium total) by mouth daily as needed for indigestion or heartburn. 03/04/20   Swayze, Ava, DO  carvedilol (COREG) 25 MG tablet Take 1 tablet (25 mg total) by mouth 2 (two) times daily with a meal. 02/01/20   Bhagat, Bhavinkumar, PA  feeding supplement, GLUCERNA SHAKE, (GLUCERNA SHAKE) LIQD Take 237 mLs by mouth 2 (two) times daily between meals. 03/04/20   Swayze, Ava, DO  ferrous [id number] C-folic acid (TRINSICON / FOLTRIN) capsule Take 1  capsule by mouth 3 (three) times daily after meals. 03/04/20   Swayze, Ava, DO  furosemide (LASIX) 40 MG tablet Take 0.5 tablets (20 mg total) by mouth daily. 03/04/20   Swayze, Ava, DO  glucose blood (CONTOUR NEXT TEST) test strip Use as instructed 02/24/17   McVey, Gelene Mink, PA-C  glucose monitoring kit (FREESTYLE) monitoring kit 1 each by Does not apply route 2  (two) times daily. 01/18/14   Charlann Lange, PA-C  insulin glargine (LANTUS) 100 UNIT/ML injection Inject 0.1 mLs (10 Units total) into the skin daily. Patient not taking: Reported on 03/07/2020 03/05/20   Swayze, Ava, DO  loratadine (CLARITIN) 10 MG tablet Take 10 mg by mouth every other day.    [provider]  losartan (COZAAR) 50 MG tablet Take 1 tablet (50 mg total) by mouth daily. 02/16/20   Gold, Wilder Glade, PA-C  MICROLET LANCETS MISC USE TO TEST BLOOD SUGAR ONCE DAILY 02/25/17   McVey, Gelene Mink, PA-C  potassium chloride SA (KLOR-CON) 20 MEQ tablet Take 1 tablet (20 mEq total) by mouth daily. 03/04/20   Swayze, Ava, DO                                                                                                                                    Past Surgical History Past Surgical History:  Procedure Laterality Date  . CORONARY ARTERY BYPASS GRAFT N/A 02/08/2020   Procedure: CORONARY ARTERY BYPASS GRAFTING (CABG) TIMES FOUR USING LEFT INTERNAL MAMMARY ARTERY AND RIGHT SAPHENOUS LEG VEIN HARVESTED ENDOSCOPICALLY;  Surgeon: Lajuana Matte, MD;  Location: [facility];  Service: Open Heart Surgery;  Laterality: N/A;  . LEFT HEART CATH AND CORONARY ANGIOGRAPHY N/A 01/31/2020   Procedure: LEFT HEART CATH AND CORONARY ANGIOGRAPHY;  Surgeon: Lorretta Harp, MD;  Location: [facility];  Service: Cardiovascular;  Laterality: N/A;  . TEE WITHOUT CARDIOVERSION N/A 02/08/2020   Procedure: TRANSESOPHAGEAL ECHOCARDIOGRAM (TEE);  Surgeon: Lajuana Matte, MD;  Location: [facility];  Service: Open Heart Surgery;  Laterality: N/A;   Family History Family History  Problem Relation Age of Onset  . Diabetes Mother   . Diabetes Sister   . Diabetes Brother   . Heart disease Brother     Social History Social History   Tobacco Use  . Smoking status: Former Smoker    Packs/day: 0.50    Types: Cigarettes    Quit date: 12/31/2019    Years since quitting: 0.1  . Smokeless tobacco:  Never Used  Substance Use Topics  . Alcohol use: No  . Drug use: No   Allergies Tramadol  Review of Systems Review of Systems All other systems are reviewed and are negative for acute change except as noted in the HPI  Physical Exam Vital Signs  I have reviewed the triage vital signs BP (!) 75/52 (BP Location: Right Arm)   Pulse 88   Temp 98.1 F (36.7  C) (Oral)   Resp (!) 31   Ht '5\' 6"'$  (1.676 m)   Wt 64 kg   SpO2 100%   BMI 22.77 kg/m   Physical Exam Vitals reviewed.  Constitutional:      General: She is not in acute distress.    Appearance: She is well-developed. She is not diaphoretic.  HENT:     Head: Normocephalic and atraumatic.     Nose: Nose normal.  Eyes:     General: No scleral icterus.       Right eye: No discharge.        Left eye: No discharge.     Conjunctiva/sclera: Conjunctivae normal.     Pupils: Pupils are equal, round, and reactive to light.  Cardiovascular:     Rate and Rhythm: Normal rate and regular rhythm.     Heart sounds: No murmur. No friction rub. No gallop.   Pulmonary:     Effort: Pulmonary effort is normal. No respiratory distress.     Breath sounds: Normal breath sounds. No stridor. No rales.  Chest:    Abdominal:     General: There is no distension.     Palpations: Abdomen is soft.     Tenderness: There is no abdominal tenderness.  Musculoskeletal:        General: No tenderness.     Cervical back: Normal range of motion and neck supple.       Legs:  Skin:    General: Skin is warm and dry.     Findings: No erythema or rash.  Neurological:     Mental Status: She is alert and oriented to person, place, and time.     ED Results and Treatments Labs (all labs ordered are listed, but only abnormal results are displayed) Labs Reviewed  COMPREHENSIVE METABOLIC PANEL - Abnormal; Notable for the following components:      Result Value   Sodium 134 (*)    Chloride 97 (*)    Glucose, Bld 178 (*)    BUN 43 (*)     Creatinine, Ser 1.68 (*)    Calcium 8.4 (*)    Total Protein 6.0 (*)    Albumin 2.2 (*)    Alkaline Phosphatase 136 (*)    GFR calc non Af Amer 29 (*)    GFR calc Af Amer 34 (*)    All other components within normal limits  CBC WITH DIFFERENTIAL/PLATELET - Abnormal; Notable for the following components:   WBC 25.1 (*)    RBC 2.91 (*)    Hemoglobin 7.9 (*)    HCT 25.1 (*)    RDW 16.3 (*)    Neutro Abs 24.6 (*)    Lymphs Abs 0.3 (*)    All other components within normal limits  PROTIME-INR - Abnormal; Notable for the following components:   Prothrombin Time 15.3 (*)    INR 1.3 (*)    All other components within normal limits  URINALYSIS, ROUTINE W REFLEX MICROSCOPIC - Abnormal; Notable for the following components:   APPearance HAZY (*)    Protein, ur 100 (*)    All other components within normal limits  BRAIN NATRIURETIC PEPTIDE - Abnormal; Notable for the following components:   B Natriuretic Peptide 504.3 (*)    All other components within normal limits  URINE CULTURE  LACTIC ACID, PLASMA  APTT  I-STAT CHEM 8, ED  EKG  EKG Interpretation  Date/Time:  Sunday Mar 09 2020 02:10:00 EDT Ventricular Rate:  91 PR Interval:    QRS Duration: 150 QT Interval:  407 QTC Calculation: 501 R Axis:   -38 Text Interpretation: Sinus rhythm Prolonged PR interval Right bundle branch block Left ventricular hypertrophy Inferior infarct, old NO STEMI. Confirmed by Addison Lank [phone]) on 03/09/2020 3:33:13 AM      Radiology CT ABDOMEN PELVIS WO CONTRAST  Result Date: 03/09/2020 CLINICAL DATA:  Vomiting. Constipation. Concern for bowel obstruction. EXAM: CT ABDOMEN AND PELVIS WITHOUT CONTRAST TECHNIQUE: Multidetector CT imaging of the abdomen and pelvis was performed following the standard protocol without IV contrast. COMPARISON:  None. FINDINGS: Lower chest: Small  dependent left pleural effusion. Mild cardiomegaly. Intact lower sternotomy wires. Hepatobiliary: Normal liver size. No liver mass. Normal gallbladder with no radiopaque cholelithiasis. No biliary ductal dilatation. Pancreas: Normal, with no mass or duct dilation. Spleen: Normal size. No mass. Adrenals/Urinary Tract: No discrete adrenal nodules. No renal stones. No hydronephrosis. No contour deforming renal masses. Normal bladder. Stomach/Bowel: Normal non-distended stomach. Normal caliber small bowel with no small bowel wall thickening. Normal appendix. Moderate diffuse colonic diverticulosis, most prominent in sigmoid colon, with no large bowel wall thickening or significant pericolonic fat stranding. Vascular/Lymphatic: Atherosclerotic nonaneurysmal abdominal aorta. No pathologically enlarged lymph nodes in the abdomen or pelvis. Reproductive: Coarsely calcified degenerated 1.6 cm uterine fibroid. No adnexal masses. Other: No pneumoperitoneum, ascites or focal fluid collection. Musculoskeletal: No aggressive appearing focal osseous lesions. Moderate lumbar spondylosis. IMPRESSION: 1. No acute abnormality. No evidence of bowel obstruction or acute bowel inflammation. Moderate diffuse colonic diverticulosis, with no evidence of acute diverticulitis. 2. Small dependent left pleural effusion. 3. Mild cardiomegaly. 4. Aortic Atherosclerosis (ICD10-I70.0). Electronically Signed   By: Ilona Sorrel M.D.   On: 03/09/2020 06:15   DG Chest Port 1 View  Result Date: 03/09/2020 CLINICAL DATA:  Lethargy EXAM: PORTABLE CHEST 1 VIEW COMPARISON:  None. FINDINGS: There is mild cardiomegaly. Overlying median sternotomy wires are present. Aortic knob calcifications are seen. A small left pleural effusion is present. The right lung is clear. No focal airspace consolidation. No acute osseous abnormality. IMPRESSION: Small left pleural effusion.  For Electronically Signed   By: Prudencio Pair M.D.   On: 03/09/2020 02:41     Pertinent labs & imaging results that were available during my care of the patient were reviewed by me and considered in my medical decision making (see chart for details).  Medications Ordered in ED Medications  sodium chloride 0.9 % bolus 500 mL (0 mLs Intravenous Stopped 03/09/20 0805)    Followed by  0.9 %  sodium chloride infusion (1,000 mLs Intravenous New Bag/Given 03/09/20 0537)  acetaminophen (TYLENOL) tablet 650 mg (650 mg Oral Given 03/09/20 0811)  Procedures .1-3 Lead EKG Interpretation Performed by: Fatima Blank, MD Authorized by: Fatima Blank, MD     Interpretation: normal     ECG rate:  88   ECG rate assessment: normal     Rhythm: sinus rhythm     Ectopy: PVCs     Conduction: normal   Ultrasound ED Echo  Date/Time: 03/09/2020 8:15 AM Performed by: Fatima Blank, MD Authorized by: Fatima Blank, MD   Procedure details:    Indications: hypotension     Views: subxiphoid, parasternal long axis view, parasternal short axis view and IVC view     Images: archived   Findings:    Pericardium: no pericardial effusion     RV Diameter: normal     IVC: collapsed   Impression:    Impression: normal   .Critical Care Performed by: Fatima Blank, MD Authorized by: Fatima Blank, MD    CRITICAL CARE Performed by: Grayce Sessions Itzael Liptak Total critical care time: 85 minutes Critical care time was exclusive of separately billable procedures and treating other patients. Critical care was necessary to treat or prevent imminent or life-threatening deterioration. Critical care was time spent personally by me on the following activities: development of treatment plan with patient and/or surrogate as well as nursing, discussions with consultants, evaluation of patient's response to treatment,  examination of patient, obtaining history from patient or surrogate, ordering and performing treatments and interventions, ordering and review of laboratory studies, ordering and review of radiographic studies, pulse oximetry and re-evaluation of patient's condition.    (including critical care time)  Medical Decision Making / ED Course I have reviewed the nursing notes for this encounter and the patient's prior records (if available in EHR or on provided paperwork).   Bianca [name] was evaluated in Emergency Department on 03/09/2020 for the symptoms described in the history of present illness. She was evaluated in the context of the global COVID-19 pandemic, which necessitated consideration that the patient might be at risk for infection with the SARS-CoV-2 virus that causes COVID-19. Institutional protocols and algorithms that pertain to the evaluation of patients at risk for COVID-19 are in a state of rapid change based on information released by regulatory bodies including the CDC and federal and state organizations. These policies and algorithms were followed during the patient's care in the ED.  Patient presents with several days of gradual fatigue noted to be hypotensive.  On review of records, patient was noted to have low blood pressures during recent CT surgery clinic visit with systolics in the 16X which improved to systolics in 09U.  Daughter reports that these were following position change.  Patient denies any chest pain or shortness of breath.  No abdominal pain.  EKG without acute ischemic changes,  She is afebrile.  She does have leukocytosis but no obvious source of infection.  Patient was seen for wound infection but the wound itself currently is well-appearing.  Lactic acid was normal.  Not currently septic. Hemoglobin is stable.  Patient does have mild AKI, which may be related to dehydration given recent history of emesis with decreased oral intake intolerance.  Bedside  echo was performed and did not reveal any evidence of pericardial effusion or tamponade.  RV size normal.  Contractility good.  IVC was collapsible, fitting dehydration.    Provided with IVF which improved pressure with 500cc and maintenance. Pressures [name] trended again and 500cc added.  CT of abd w/o SBO, or serious intra-abdominal inflammatory/infectious process.  Patient noted to urinary retention.  In and out cath produced 500 cc of urine.  UA was negative for infection.  Will required admission for further management.       Final Clinical Impression(s) / ED Diagnoses Final diagnoses:  Hypotension due to hypovolemia  AKI (acute kidney injury) ([place])      This chart was dictated using voice recognition software.  Despite best efforts to proofread,  errors can occur which can change the documentation meaning.   Fatima Blank, MD 03/09/20 [phone]

## 2020-03-09 NOTE — H&P (Signed)
History and Physical    Bianca [name] ZOX:[id number] DOB: [birth year] DOA: 03/09/2020  PCP: Julian Hy, PA-C Consultants:  [facility] - CVTS; [facility] - cardiology Patient coming from:  Home - lives with son; NOK: Daughter, Tacoya Altizer, [phone]  Chief Complaint: Lethargy  HPI: Bianca [name] is a 74 y.o. female with medical history significant of CAD s/p CABG; HTN; and DM presenting with lethargy.  She had CABG 1 month ago and was subsequently hospitalized from 5/17-25 for wound dehiscence and leg swelling.   She was recently discharged.  Yesterday, she got up and and had a sponge bath.  She went to the living room to eat and she vomited.  They gave her dramamine, Pepto and a protein shak.  About 6pm, she was constipated and and needed help with manual disimpaction.  She had not voided all day and had small BM.  She was weak but able to stand.  She tried to eat and vomited again.  They went to bed about 10pm and it took 2 hours to get there and she wasn't able to stand and get there.  Finally, they decided to call 911.  She last ate well Friday AM.  She had been doing well when she left the hospital and this was an acute change, other than constipation.  She gave her Miralax, pedialyte, and water with some passage of stool.      ED Course:  Hypovolemia, decreased PO intake, emesis.  Has CABG in March and recent hospitalization for wound dehiscence from harvest site. Wound vac in place and wound looks ok.  Hypotension, SBP in 80s.  Decreased appetite, not eating well, some emesis, mild abdominal discomfort. CT abdomen unremarkable.  BP is fluid responsive.  Review of Systems: As per HPI; otherwise review of systems reviewed and negative. Limited by weakness.  Ambulatory Status:  Ambulates with a walker right now  COVID Vaccine Status:  Complete  Past Medical History:  Diagnosis Date  . CAD (coronary artery disease) 01/31/2020   CABG  . Diabetes mellitus without  complication ([place])   . Dyspnea   . Hypertension   . Hypertensive urgency 01/06/2020    Past Surgical History:  Procedure Laterality Date  . CORONARY ARTERY BYPASS GRAFT N/A 02/08/2020   Procedure: CORONARY ARTERY BYPASS GRAFTING (CABG) TIMES FOUR USING LEFT INTERNAL MAMMARY ARTERY AND RIGHT SAPHENOUS LEG VEIN HARVESTED ENDOSCOPICALLY;  Surgeon: Lajuana Matte, MD;  Location: [facility];  Service: Open Heart Surgery;  Laterality: N/A;  . LEFT HEART CATH AND CORONARY ANGIOGRAPHY N/A 01/31/2020   Procedure: LEFT HEART CATH AND CORONARY ANGIOGRAPHY;  Surgeon: Lorretta Harp, MD;  Location: [facility];  Service: Cardiovascular;  Laterality: N/A;  . TEE WITHOUT CARDIOVERSION N/A 02/08/2020   Procedure: TRANSESOPHAGEAL ECHOCARDIOGRAM (TEE);  Surgeon: Lajuana Matte, MD;  Location: [facility];  Service: Open Heart Surgery;  Laterality: N/A;    Social History   Socioeconomic History  . Marital status: Divorced    Spouse name: Not on file  . Number of children: Not on file  . Years of education: Not on file  . Highest education level: Not on file  Occupational History  . Occupation: retired  Tobacco Use  . Smoking status: Former Smoker    Packs/day: 0.50    Years: 56.00    Pack years: 28.00    Types: Cigarettes    Quit date: 12/31/2019    Years since quitting: 0.1  . Smokeless tobacco: Never Used  Substance and  Sexual Activity  . Alcohol use: No  . Drug use: No  . Sexual activity: Not on file  Other Topics Concern  . Not on file  Social History Narrative  . Not on file   Social Determinants of Health   Financial Resource Strain:   . Difficulty of Paying Living Expenses:   Food Insecurity:   . Worried About Charity fundraiser in the Last Year:   . Arboriculturist in the Last Year:   Transportation Needs:   . Film/video editor (Medical):   Marland Kitchen Lack of Transportation (Non-Medical):   Physical Activity:   . Days of Exercise per Week:   . Minutes of Exercise per  Session:   Stress:   . Feeling of Stress :   Social Connections:   . Frequency of Communication with Friends and Family:   . Frequency of Social Gatherings with Friends and Family:   . Attends Religious Services:   . Active Member of Clubs or Organizations:   . Attends Archivist Meetings:   Marland Kitchen Marital Status:   Intimate Partner Violence:   . Fear of Current or Ex-Partner:   . Emotionally Abused:   Marland Kitchen Physically Abused:   . Sexually Abused:     Allergies  Allergen Reactions  . Tramadol Nausea And Vomiting    Family History  Problem Relation Age of Onset  . Diabetes Mother   . Diabetes Sister   . Diabetes Brother   . Heart disease Brother     Prior to Admission medications   Medication Sig Start Date End Date Taking? Authorizing Provider  acetaminophen (TYLENOL) 325 MG tablet Take 650 mg by mouth every 6 (six) hours.    [provider]  amLODipine (NORVASC) 5 MG tablet Take 5 mg by mouth daily.    [provider]  aspirin EC 325 MG EC tablet Take 1 tablet (325 mg total) by mouth daily. 02/16/20   Gold, Wayne E, PA-C  atorvastatin (LIPITOR) 80 MG tablet Take 1 tablet (80 mg total) by mouth daily. 02/01/20   Bhagat, Crista Luria, PA  bismuth subsalicylate (PEPTO BISMOL) 262 MG/15ML suspension Take 30 mLs by mouth every 6 (six) hours as needed for indigestion.    [provider]  Blood Pressure Monitoring (ADULT BLOOD PRESSURE CUFF LG) KIT Use to check blood pressure twice a day, keep a running log of your blood pressures 01/31/15   Linton Flemings, MD  calcium carbonate (TUMS - DOSED IN MG ELEMENTAL CALCIUM) 500 MG chewable tablet Chew 1 tablet (200 mg of elemental calcium total) by mouth daily as needed for indigestion or heartburn. 03/04/20   Swayze, Ava, DO  carvedilol (COREG) 25 MG tablet Take 1 tablet (25 mg total) by mouth 2 (two) times daily with a meal. 02/01/20   Bhagat, Bhavinkumar, PA  feeding supplement, GLUCERNA SHAKE, (GLUCERNA SHAKE) LIQD  Take 237 mLs by mouth 2 (two) times daily between meals. 03/04/20   Swayze, Ava, DO  ferrous [id number] C-folic acid (TRINSICON / FOLTRIN) capsule Take 1 capsule by mouth 3 (three) times daily after meals. 03/04/20   Swayze, Ava, DO  furosemide (LASIX) 40 MG tablet Take 0.5 tablets (20 mg total) by mouth daily. 03/04/20   Swayze, Ava, DO  glucose blood (CONTOUR NEXT TEST) test strip Use as instructed 02/24/17   McVey, Gelene Mink, PA-C  glucose monitoring kit (FREESTYLE) monitoring kit 1 each by Does not apply route 2 (two) times daily. 01/18/14   Charlann Lange, PA-C  insulin glargine (LANTUS) 100 UNIT/ML injection Inject 0.1 mLs (10 Units total) into the skin daily. Patient not taking: Reported on 03/07/2020 03/05/20   Swayze, Ava, DO  loratadine (CLARITIN) 10 MG tablet Take 10 mg by mouth every other day.    [provider]  losartan (COZAAR) 50 MG tablet Take 1 tablet (50 mg total) by mouth daily. 02/16/20   Gold, Wilder Glade, PA-C  MICROLET LANCETS MISC USE TO TEST BLOOD SUGAR ONCE DAILY 02/25/17   McVey, Gelene Mink, PA-C  potassium chloride SA (KLOR-CON) 20 MEQ tablet Take 1 tablet (20 mEq total) by mouth daily. 03/04/20   Swayze, Ava, DO    Physical Exam: Vitals:   03/09/20 1200 03/09/20 1215 03/09/20 1230 03/09/20 1245  BP: (!) 123/52 (!) 107/51 106/67 (!) 112/56  Pulse:      Resp: 20 20 (!) 22 (!) 21  Temp:      TempSrc:      SpO2:      Weight:      Height:         . General:  Appears chronically ill, fatigued . Eyes:  PERRL, EOMI, normal lids, iris . ENT:  grossly normal hearing, lips & tongue, mildly dry mm . Neck:  no LAD, masses or thyromegaly . Cardiovascular:  RRR, no m/r/g.  . Respiratory:   CTA bilaterally with no wheezes/rales/rhonchi.  Normal respiratory effort. . Abdomen:  soft, NT, ND, NABS . Skin:  RLE edema without erythema, wound vac in place and appears to be draining appropriately without obvious purulence . Musculoskeletal:  grossly  normal tone BUE/BLE, good ROM, no bony abnormality . Psychiatric:  blunted mood and affect, speech fluent and appropriate, AOx3 . Neurologic:  CN 2-12 grossly intact, moves all extremities in coordinated fashion, more limited RLE movement due to pain    Radiological Exams on Admission: CT ABDOMEN PELVIS WO CONTRAST  Result Date: 03/09/2020 CLINICAL DATA:  Vomiting. Constipation. Concern for bowel obstruction. EXAM: CT ABDOMEN AND PELVIS WITHOUT CONTRAST TECHNIQUE: Multidetector CT imaging of the abdomen and pelvis was performed following the standard protocol without IV contrast. COMPARISON:  None. FINDINGS: Lower chest: Small dependent left pleural effusion. Mild cardiomegaly. Intact lower sternotomy wires. Hepatobiliary: Normal liver size. No liver mass. Normal gallbladder with no radiopaque cholelithiasis. No biliary ductal dilatation. Pancreas: Normal, with no mass or duct dilation. Spleen: Normal size. No mass. Adrenals/Urinary Tract: No discrete adrenal nodules. No renal stones. No hydronephrosis. No contour deforming renal masses. Normal bladder. Stomach/Bowel: Normal non-distended stomach. Normal caliber small bowel with no small bowel wall thickening. Normal appendix. Moderate diffuse colonic diverticulosis, most prominent in sigmoid colon, with no large bowel wall thickening or significant pericolonic fat stranding. Vascular/Lymphatic: Atherosclerotic nonaneurysmal abdominal aorta. No pathologically enlarged lymph nodes in the abdomen or pelvis. Reproductive: Coarsely calcified degenerated 1.6 cm uterine fibroid. No adnexal masses. Other: No pneumoperitoneum, ascites or focal fluid collection. Musculoskeletal: No aggressive appearing focal osseous lesions. Moderate lumbar spondylosis. IMPRESSION: 1. No acute abnormality. No evidence of bowel obstruction or acute bowel inflammation. Moderate diffuse colonic diverticulosis, with no evidence of acute diverticulitis. 2. Small dependent left pleural  effusion. 3. Mild cardiomegaly. 4. Aortic Atherosclerosis (ICD10-I70.0). Electronically Signed   By: Ilona Sorrel M.D.   On: 03/09/2020 06:15   DG Chest Port 1 View  Result Date: 03/09/2020 CLINICAL DATA:  Lethargy EXAM: PORTABLE CHEST 1 VIEW COMPARISON:  None. FINDINGS: There is mild cardiomegaly. Overlying median sternotomy wires are present. Aortic knob calcifications are seen. A small left  pleural effusion is present. The right lung is clear. No focal airspace consolidation. No acute osseous abnormality. IMPRESSION: Small left pleural effusion.  For Electronically Signed   By: Prudencio Pair M.D.   On: 03/09/2020 02:41    EKG: Independently reviewed.  NSR with rate 91; RBBB; LVH;  no evidence of acute ischemia   Labs on Admission: I have personally reviewed the available labs and imaging studies at the time of the admission.  Pertinent labs:   Glucose 178 BUN 43/Creatinine 1.68/GFR 34; 18/1.06/59 on 5/22 BNP 504.3 Lactate 1.3 WBC 25.1 Hgb 7.9 INR 1.3 UA: 100 protein Urine culture pending   Assessment/Plan Principal Problem:   Hypotension due to hypovolemia Active Problems:   Type 2 diabetes mellitus (HCC)   Essential hypertension   Hyperlipidemia   CAD (coronary artery disease)   Wound dehiscence   Constipation   AKI (acute kidney injury) ([place])   Hypotension/AKI due to hypovolemia -Patient with recent prior hospitalizations associated with ACS and then harvest wound dehiscence -She has developed constipation which appears to be causing her to limit her PO intake -As such, she became increasingly weak and dehydrated -Likely due to prerenal failure secondary to dehydration and continuation of ARB, diruetics and NSAIDs use. -Hold nephrotoxic medications for now -She is fluid responsive; continue IVF -Follow up renal function by BMP -Will observe for now on telemetry  CAD with recent CABG -Continue ASA -No current c/o chest pain  Recent wound dehiscence -Wound vac in  place -She is having significant ongoing pain -She has been taking Tylenol 650 mg q6h standing and Advil 400 mg standing -Hold Advil for now due to renal dysfunction -She has declined other pain medications at this time -Given her limited mobility, she is becoming increasingly deconditioned; will order PT/OT evaluations starting tomorrow  Constipation -Patient has had worsening constipation, and this appears to be the reason for her decreased PO intake and emesis -Will give bowel care as well as tap water enema and Dulcolax and start daily Miralax and Colace -She will need a good bowel regimen going forward -Clear liquids for now, ok to resume diet when patient is cleaned out  HTN -Hold medications for now given hypotension on presentation -Her home meds include Norvasc, Coreg, and Cozaar  HLD -Continue Lipitor  DM -Recent A1c was 7.1 -Continue Lantus (has not started at home due to insurance pre-authorization and so she is still hyperglycemic) -Cover with sensitive-scale SSI   Note: This patient has been tested and is negative for the novel coronavirus COVID-19.  DVT prophylaxis:  Lovenox  Code Status:  Full - confirmed with patient/family Family Communication: Daughter was present throughout evaluation Disposition Plan:  The patient is from: home  Anticipated d/c is to: home with [facility] services   Anticipated d/c date will depend on clinical response to treatment, but possibly as early as tomorrow if she has excellent response to treatment  Patient is currently: acutely ill Consults called: PT/OT Admission status:  It is my clinical opinion that referral for OBSERVATION is reasonable and necessary in this patient based on the above information provided. The aforementioned taken together are felt to place the patient at high risk for further clinical deterioration. However it is anticipated that the patient may be medically stable for discharge from the hospital within 24 to 48  hours.    Karmen Bongo MD [facility]   How to contact the [facility] Attending or Consulting provider [place] or covering provider during after hours 7P -  7A, for this patient?  1. Check the care team in [facility] and look for a) attending/consulting TRH provider listed and b) the [facility] team listed 2. Log into [email or web address] and use 's universal password to access. If you do not have the password, please contact the hospital operator. 3. Locate the [facility] provider you are looking for under [facility] and page to a number that you can be directly reached. 4. If you still have difficulty reaching the provider, please page the [facility] (Director on Call) for the Hospitalists listed on amion for assistance.   03/09/2020, 12:58 PM

## 2020-03-09 NOTE — ED Notes (Signed)
Pt unable to void. Verbal order to start foley per Dr. Ophelia Charter

## 2020-03-10 DIAGNOSIS — D72829 Elevated white blood cell count, unspecified: Secondary | ICD-10-CM

## 2020-03-10 DIAGNOSIS — R627 Adult failure to thrive: Secondary | ICD-10-CM | POA: Diagnosis not present

## 2020-03-10 DIAGNOSIS — I10 Essential (primary) hypertension: Secondary | ICD-10-CM | POA: Diagnosis not present

## 2020-03-10 DIAGNOSIS — N179 Acute kidney failure, unspecified: Secondary | ICD-10-CM | POA: Diagnosis not present

## 2020-03-10 DIAGNOSIS — Z20822 Contact with and (suspected) exposure to covid-19: Secondary | ICD-10-CM | POA: Diagnosis present

## 2020-03-10 DIAGNOSIS — M7989 Other specified soft tissue disorders: Secondary | ICD-10-CM | POA: Diagnosis not present

## 2020-03-10 DIAGNOSIS — E1169 Type 2 diabetes mellitus with other specified complication: Secondary | ICD-10-CM | POA: Diagnosis not present

## 2020-03-10 DIAGNOSIS — I340 Nonrheumatic mitral (valve) insufficiency: Secondary | ICD-10-CM | POA: Diagnosis not present

## 2020-03-10 DIAGNOSIS — I25118 Atherosclerotic heart disease of native coronary artery with other forms of angina pectoris: Secondary | ICD-10-CM | POA: Diagnosis not present

## 2020-03-10 DIAGNOSIS — T8149XA Infection following a procedure, other surgical site, initial encounter: Secondary | ICD-10-CM | POA: Diagnosis present

## 2020-03-10 DIAGNOSIS — I9589 Other hypotension: Secondary | ICD-10-CM

## 2020-03-10 DIAGNOSIS — R5381 Other malaise: Secondary | ICD-10-CM | POA: Diagnosis not present

## 2020-03-10 DIAGNOSIS — E1165 Type 2 diabetes mellitus with hyperglycemia: Secondary | ICD-10-CM | POA: Diagnosis present

## 2020-03-10 DIAGNOSIS — E861 Hypovolemia: Secondary | ICD-10-CM | POA: Diagnosis not present

## 2020-03-10 DIAGNOSIS — Z885 Allergy status to narcotic agent status: Secondary | ICD-10-CM | POA: Diagnosis not present

## 2020-03-10 DIAGNOSIS — E782 Mixed hyperlipidemia: Secondary | ICD-10-CM

## 2020-03-10 DIAGNOSIS — E1151 Type 2 diabetes mellitus with diabetic peripheral angiopathy without gangrene: Secondary | ICD-10-CM | POA: Diagnosis present

## 2020-03-10 DIAGNOSIS — D72825 Bandemia: Secondary | ICD-10-CM | POA: Diagnosis not present

## 2020-03-10 DIAGNOSIS — Z515 Encounter for palliative care: Secondary | ICD-10-CM | POA: Diagnosis not present

## 2020-03-10 DIAGNOSIS — K5900 Constipation, unspecified: Secondary | ICD-10-CM | POA: Diagnosis not present

## 2020-03-10 DIAGNOSIS — Z951 Presence of aortocoronary bypass graft: Secondary | ICD-10-CM | POA: Diagnosis not present

## 2020-03-10 DIAGNOSIS — Z6827 Body mass index (BMI) 27.0-27.9, adult: Secondary | ICD-10-CM | POA: Diagnosis not present

## 2020-03-10 DIAGNOSIS — I110 Hypertensive heart disease with heart failure: Secondary | ICD-10-CM | POA: Diagnosis present

## 2020-03-10 DIAGNOSIS — I6330 Cerebral infarction due to thrombosis of unspecified cerebral artery: Secondary | ICD-10-CM | POA: Diagnosis not present

## 2020-03-10 DIAGNOSIS — T8131XA Disruption of external operation (surgical) wound, not elsewhere classified, initial encounter: Secondary | ICD-10-CM | POA: Diagnosis not present

## 2020-03-10 DIAGNOSIS — E860 Dehydration: Secondary | ICD-10-CM | POA: Diagnosis present

## 2020-03-10 DIAGNOSIS — Z66 Do not resuscitate: Secondary | ICD-10-CM | POA: Diagnosis not present

## 2020-03-10 DIAGNOSIS — R5383 Other fatigue: Secondary | ICD-10-CM | POA: Diagnosis present

## 2020-03-10 DIAGNOSIS — T8130XA Disruption of wound, unspecified, initial encounter: Secondary | ICD-10-CM

## 2020-03-10 DIAGNOSIS — R400 Somnolence: Secondary | ICD-10-CM | POA: Diagnosis not present

## 2020-03-10 DIAGNOSIS — E876 Hypokalemia: Secondary | ICD-10-CM | POA: Diagnosis not present

## 2020-03-10 DIAGNOSIS — I639 Cerebral infarction, unspecified: Secondary | ICD-10-CM | POA: Diagnosis present

## 2020-03-10 DIAGNOSIS — I63233 Cerebral infarction due to unspecified occlusion or stenosis of bilateral carotid arteries: Secondary | ICD-10-CM | POA: Diagnosis not present

## 2020-03-10 DIAGNOSIS — Z79899 Other long term (current) drug therapy: Secondary | ICD-10-CM | POA: Diagnosis not present

## 2020-03-10 DIAGNOSIS — Z7189 Other specified counseling: Secondary | ICD-10-CM | POA: Diagnosis not present

## 2020-03-10 DIAGNOSIS — F5089 Other specified eating disorder: Secondary | ICD-10-CM | POA: Diagnosis not present

## 2020-03-10 DIAGNOSIS — Z87891 Personal history of nicotine dependence: Secondary | ICD-10-CM | POA: Diagnosis not present

## 2020-03-10 DIAGNOSIS — I2510 Atherosclerotic heart disease of native coronary artery without angina pectoris: Secondary | ICD-10-CM | POA: Diagnosis present

## 2020-03-10 DIAGNOSIS — R4182 Altered mental status, unspecified: Secondary | ICD-10-CM | POA: Diagnosis not present

## 2020-03-10 DIAGNOSIS — E43 Unspecified severe protein-calorie malnutrition: Secondary | ICD-10-CM | POA: Diagnosis not present

## 2020-03-10 DIAGNOSIS — F509 Eating disorder, unspecified: Secondary | ICD-10-CM | POA: Diagnosis not present

## 2020-03-10 DIAGNOSIS — T8141XA Infection following a procedure, superficial incisional surgical site, initial encounter: Secondary | ICD-10-CM | POA: Diagnosis not present

## 2020-03-10 DIAGNOSIS — I5023 Acute on chronic systolic (congestive) heart failure: Secondary | ICD-10-CM | POA: Diagnosis not present

## 2020-03-10 DIAGNOSIS — R439 Unspecified disturbances of smell and taste: Secondary | ICD-10-CM | POA: Diagnosis not present

## 2020-03-10 DIAGNOSIS — Z8249 Family history of ischemic heart disease and other diseases of the circulatory system: Secondary | ICD-10-CM | POA: Diagnosis not present

## 2020-03-10 DIAGNOSIS — M60009 Infective myositis, unspecified site: Secondary | ICD-10-CM | POA: Diagnosis not present

## 2020-03-10 DIAGNOSIS — Z833 Family history of diabetes mellitus: Secondary | ICD-10-CM | POA: Diagnosis not present

## 2020-03-10 LAB — CBC
HCT: 24.2 % — ABNORMAL LOW (ref 36.0–46.0)
Hemoglobin: 7.4 g/dL — ABNORMAL LOW (ref 12.0–15.0)
MCH: 26.4 pg (ref 26.0–34.0)
MCHC: 30.6 g/dL (ref 30.0–36.0)
MCV: 86.4 fL (ref 80.0–100.0)
Platelets: 307 10*3/uL (ref 150–400)
RBC: 2.8 MIL/uL — ABNORMAL LOW (ref 3.87–5.11)
RDW: 16.4 % — ABNORMAL HIGH (ref 11.5–15.5)
WBC: 23.8 10*3/uL — ABNORMAL HIGH (ref 4.0–10.5)
nRBC: 0 % (ref 0.0–0.2)

## 2020-03-10 LAB — BASIC METABOLIC PANEL
Anion gap: 11 (ref 5–15)
BUN: 37 mg/dL — ABNORMAL HIGH (ref 8–23)
CO2: 22 mmol/L (ref 22–32)
Calcium: 8 mg/dL — ABNORMAL LOW (ref 8.9–10.3)
Chloride: 103 mmol/L (ref 98–111)
Creatinine, Ser: 1.21 mg/dL — ABNORMAL HIGH (ref 0.44–1.00)
GFR calc Af Amer: 51 mL/min — ABNORMAL LOW (ref >60)
GFR calc non Af Amer: 44 mL/min — ABNORMAL LOW (ref >60)
Glucose, Bld: 130 mg/dL — ABNORMAL HIGH (ref 70–99)
Potassium: 3.2 mmol/L — ABNORMAL LOW (ref 3.5–5.1)
Sodium: 136 mmol/L (ref 135–145)

## 2020-03-10 LAB — GLUCOSE, CAPILLARY
Glucose-Capillary: 118 mg/dL — ABNORMAL HIGH (ref 70–99)
Glucose-Capillary: 104 mg/dL — ABNORMAL HIGH (ref 70–99)

## 2020-03-10 LAB — URINE CULTURE: Culture: NO GROWTH

## 2020-03-10 LAB — SARS CORONAVIRUS 2 BY RT PCR (HOSPITAL ORDER, PERFORMED IN ~~LOC~~ HOSPITAL LAB): SARS Coronavirus 2: NEGATIVE

## 2020-03-10 LAB — MAGNESIUM: Magnesium: 1.8 mg/dL (ref 1.7–2.4)

## 2020-03-10 MED ORDER — SODIUM CHLORIDE 0.9 % IV SOLN: INTRAVENOUS | Status: DC

## 2020-03-10 MED ORDER — ENOXAPARIN SODIUM 40 MG/0.4ML ~~LOC~~ SOLN
40.0000 mg | Freq: Every day | SUBCUTANEOUS | Status: DC
  Administered 2020-03-11 – 2020-03-24 (×14): 40 mg via SUBCUTANEOUS
  Filled 2020-03-10 (×14): qty 0.4

## 2020-03-10 MED ORDER — DEXTROSE-NACL 5-0.9 % IV SOLN: INTRAVENOUS | Status: DC

## 2020-03-10 MED ORDER — SODIUM CHLORIDE 0.9 % IV SOLN
510.0000 mg | Freq: Once | INTRAVENOUS | Status: AC
  Administered 2020-03-10: 510 mg via INTRAVENOUS
  Filled 2020-03-10: qty 17

## 2020-03-10 MED ORDER — POTASSIUM CHLORIDE CRYS ER 20 MEQ PO TBCR
40.0000 meq | EXTENDED_RELEASE_TABLET | ORAL | Status: AC
  Administered 2020-03-10: 40 meq via ORAL
  Filled 2020-03-10: qty 2

## 2020-03-10 NOTE — Progress Notes (Signed)
Patient refused lab draws this morning. MD aware

## 2020-03-10 NOTE — TOC Initial Note (Signed)
Transition of Care [facility]) - Initial/Assessment Note    Patient Details  Name: RONALEE SCHEUNEMANN MRN: [id number] Date of Birth: Jan 21, 1946  [facility]) CM/SW Contact:    Terrial Rhodes, LCSWA Phone Number: 03/10/2020, 2:24 PM  Clinical Narrative:                  CSW spoke with patient at bedside. Patient declined SNF placement. Patient requested Home Health Services. Patient gave CSW permission to discuss her care with her daughter and son.  CSW let patient know that case manager will be following up with her in regards to [facility] for patient.  CSW will continue to follow.  Expected Discharge Plan: Home w Home Health Services Barriers to Discharge: Continued Medical Work up   Patient Goals and CMS Choice Patient states their goals for this hospitalization and ongoing recovery are:: to go home with home health CMS Medicare.gov Compare Post Acute Care list provided to:: Patient Choice offered to / list presented to : Patient  Expected Discharge Plan and Services Expected Discharge Plan: Home w Home Health Services                                              Prior Living Arrangements/Services     Patient language and need for interpreter reviewed:: Yes Do you feel safe going back to the place where you live?: Yes      Need for Family Participation in Patient Care: Yes (Comment) Care giver support system in place?: Yes (comment)   Criminal Activity/Legal Involvement Pertinent to Current Situation/Hospitalization: No - Comment as needed  Activities of Daily Living      Permission Sought/Granted Permission sought to share information with : Case Manager, Magazine features editor, Family Supports Permission granted to share information with : Yes, Verbal Permission Granted  Share Information with NAME: Joletta  Permission granted to share info w AGENCY: HH  Permission granted to share info w Relationship: daughter  Permission  granted to share info w Contact Information: Am[place] Coe [phone]  Emotional Assessment Appearance:: Appears stated age Attitude/Demeanor/Rapport: Gracious Affect (typically observed): Calm Orientation: : Oriented to Self, Oriented to Place, Oriented to  Time, Oriented to Situation Alcohol / Substance Use: Not Applicable Psych Involvement: No (comment)  Admission diagnosis:  AKI (acute kidney injury) (HCC) [N17.9] Hypotension due to hypovolemia [I95.89, E86.1] Patient Active Problem List   Diagnosis Date Noted  . Hypotension due to hypovolemia 03/09/2020  . Constipation 03/09/2020  . AKI (acute kidney injury) (HCC) 03/09/2020  . Malnutrition of moderate degree 02/29/2020  . Wound dehiscence 02/25/2020  . S/P CABG x 4 02/08/2020  . Coronary artery disease 02/08/2020  . Chest pain 02/03/2020  . Unstable angina (HCC) 02/03/2020  . Hypokalemia 02/03/2020  . CAD (coronary artery disease) 01/31/2020  . Pain due to onychomycosis of toenails of both feet 01/30/2020  . Chest pain of uncertain etiology 01/29/2020  . Non-STEMI (non-ST elevated myocardial infarction) (HCC) 01/29/2020  . Essential hypertension 01/29/2020  . Hyperlipidemia 01/29/2020  . Acute diastolic CHF (congestive heart failure) (HCC) 01/06/2020  . CHF (congestive heart failure) (HCC) 01/06/2020  . Noncompliance with medication regimen 08/17/2018  . Noncompliance with diagnostic testing 08/17/2018  . Type 2 diabetes mellitus (HCC) 04/17/2008  . TOBACCO ABUSE 04/17/2008   PCP:  Luciano Cutter, PA-C Pharmacy:   [facility] [phone] -  [address], [address] [place] [address] Phone: [phone] Fax: [phone]     Social Determinants of Health (SDOH) Interventions    Readmission Risk Interventions Readmission Risk Prevention Plan 03/04/2020 02/15/2020 02/14/2020  Transportation Screening Complete Complete -  PCP or  Specialist Appt within 5-7 Days Complete Complete -  Home Care Screening Complete Complete -  Medication Review (RN CM) Complete Complete -  HRI or Home Care Consult - - Complete  Social Work Consult for Recovery Care Planning/Counseling - - Complete  Palliative Care Screening - - Not Applicable  Some recent data might be hidden

## 2020-03-10 NOTE — Progress Notes (Signed)
PROGRESS NOTE    Bianca [name]  IWL:[id number] DOB: 06-27-1946 DOA: 03/09/2020 PCP: Luciano Cutter, PA-C    No chief complaint on file.   Brief Narrative: HPI per Dr. Keenan Bachelor Bianca [name] is a 74 y.o. female with medical history significant of CAD s/p CABG; HTN; and DM presenting with lethargy.  She had CABG 1 month ago and was subsequently hospitalized from 5/17-25 for wound dehiscence and leg swelling.   She was recently discharged.  Yesterday, she got up and and had a sponge bath.  She went to the living room to eat and she vomited.  They gave her dramamine, Pepto and a protein shak.  About 6pm, she was constipated and and needed help with manual disimpaction.  She had not voided all day and had small BM.  She was weak but able to stand.  She tried to eat and vomited again.  They went to bed about 10pm and it took 2 hours to get there and she wasn't able to stand and get there.  Finally, they decided to call 911.  She last ate well Friday AM.  She had been doing well when she left the hospital and this was an acute change, other than constipation.  She gave her Miralax, pedialyte, and water with some passage of stool.      ED Course:  Hypovolemia, decreased PO intake, emesis.  Has CABG in March and recent hospitalization for wound dehiscence from harvest site. Wound vac in place and wound looks ok.  Hypotension, SBP in 80s.  Decreased appetite, not eating well, some emesis, mild abdominal discomfort. CT abdomen unremarkable.  BP is fluid responsive.  Assessment & Plan:   Principal Problem:   Hypotension due to hypovolemia Active Problems:   Type 2 diabetes mellitus (HCC)   Essential hypertension   Hyperlipidemia   CAD (coronary artery disease)   Wound dehiscence   Constipation   AKI (acute kidney injury) (HCC)   1 hypotension Likely secondary to hypovolemia secondary to poor oral intake.  Patient with recent prior hospitalization associated with ACS and then harvest  wound dehiscence.  Patient noted to have developed constipation which was felt likely causing her to limit her oral intake leading to dehydration and generalized weakness.  Patient refusing to take her medications, refusing oral intake.  Blood pressure was responded to IV fluids.  Blood pressure this morning at 105/48.  Patient afebrile.  Urinalysis unremarkable.  Chest x-ray negative.  Blood cultures ordered however patient refused.  Change IV fluids from LR to D5 normal saline as patient with poor oral intake and refusing oral intake..  2.  Acute kidney injury Secondary to prerenal azotemia in the setting of poor oral intake, NSAID use, diuretics, ARB.  Patient noted to be hypotensive on admission.  Hypotension with some response to IV fluids.  Urinalysis nitrite negative, leukocytes negative, 100 protein.  Renal function improving with hydration.  Change IV fluids from LR to D5 normal saline as patient with poor oral intake.  Follow.  3.  Hypertension Hold antihypertensive medications secondary to problem #1.  4.  Constipation Patient having bowel movement on regimen of MiraLAX daily and Colace.  Patient noted to have refused her medications today.  IV fluids.  CT abdomen and pelvis negative for any acute abnormalities.  Supportive care.  5.  Dehydration IV fluids.  6.  Diabetes mellitus type 2 Hemoglobin A1c 7.1 (02/25/2020).  CBG of 118 this morning.  Patient with poor oral intake.  Change IV  fluids from LR to D5 normal saline.  Advance diet from clear liquids to heart healthy diet.  Supportive care.  Follow.  7.  Failure to thrive Patient refusing oral intake.  Patient with poor oral intake at present with hypotension felt secondary to poor p.o. intake.  Patient states no appetite.  CT abdomen and pelvis which was done negative for any acute abnormalities.  Patient with a leukocytosis however afebrile, chest x-ray negative for any acute infiltrates.  Urinalysis is unremarkable.  Blood cultures  ordered but patient refused.  Change IV fluids to D5 normal saline.  Follow.  If patient with continued failure to thrive may need to get palliative care involved.  IV fluids.  Follow for now.  8.  Hypokalemia Magnesium level at 1.8.  K. Dur 40 mEq p.o. every 4 hours x2 doses.  Follow.  7.  Anemia Patient with anemia with hemoglobin of 7.4.  Patient with weakness and hypotension.  Anemia panel with a a iron level of 16, TIBC of 196.  Follow H&H.  Transfusion threshold hemoglobin < 7.  We will give a dose of IV iron.  Follow.  8.  Hyperlipidemia Continue statin.  9.  Coronary artery disease with recent CABG Continue aspirin.  Denies any current chest pain.  ARB, diuretics on hold due to hypotension.  Outpatient follow-up with cardiology.  10.  Recent wound dehiscence Wound VAC in place.  Patient noted to have ongoing pain.  Patient noted to be taking Tylenol 650 mg every 6 hours standing dose.  Advil discontinued due to acute kidney injury.  Patient noted to have declined other pain medications at this time.  Due to limited mobility PT OT ordered and recommended SNF however patient noted to be refusing SNF and wanted to go home with home health.  Will order home health.  11.  Leukocytosis Likely reactive leukocytosis.  Patient afebrile.  Urinalysis unremarkable.  Chest x-ray negative for any acute infiltrates.  Patient with no respiratory symptoms.  Blood cultures ordered however patient refused.  We will try to check blood cultures with a.m. labs.   DVT prophylaxis: Lovenox Code Status: Full Family Communication: Updated son at bedside. Disposition:   Status is: Observation    Dispo: The patient is from: Home              Anticipated d/c is to: Home with home health therapies              Anticipated d/c date is: To be determined.              Patient currently with poor oral intake, refusing oral intake and medications.  Eating less than 25% of meals.  Patient presented with  constipation on IV fluids due to hypotension and poor oral intake.       Consultants:   Wound care  Procedures:  CT abdomen and pelvis 03/09/2020  Chest x-ray 03/09/2020    Antimicrobials:   None   Subjective: Patient with poor oral intake.  Patient refusing to eat or drink per RN.  Patient refusing oral medications.  Patient does not want diet to be advanced as she states she just does not have an appetite for anything.  Denies any chest pain.  No shortness of breath.  No abdominal pain.  Patient noted to be having bowel movement.  Patient refusing labs.  No further emesis.  Objective: Vitals:   03/09/20 1408 03/09/20 2130 03/10/20 0136 03/10/20 0427  BP: (!) 111/48 (!) 97/46 (!) 112/54 Marland Kitchen)  105/48  Pulse: 80 82 81 78  Resp: 20 17 18 16   Temp: 98.1 F (36.7 C) 97.9 F (36.6 C) 98.2 F (36.8 C) 98.5 F (36.9 C)  TempSrc: Oral Oral Oral Oral  SpO2: 96% 98% 97% 98%  Weight:      Height:        Intake/Output Summary (Last 24 hours) at 03/10/2020 1215 Last data filed at 03/10/2020 0431 Gross per 24 hour  Intake 1247.88 ml  Output 600 ml  Net 647.88 ml   Filed Weights   03/09/20 0209  Weight: 64 kg    Examination:  General exam: Appears calm and comfortable  Respiratory system: Clear to auscultation. Respiratory effort normal. Cardiovascular system: S1 & S2 heard, RRR. No JVD, murmurs, rubs, gallops or clicks. No pedal edema.  Midsternal wound noted. Gastrointestinal system: Abdomen is nondistended, soft and nontender. No organomegaly or masses felt. Normal bowel sounds heard. Central nervous system: Alert and oriented. No focal neurological deficits. Extremities: Symmetric 5 x 5 power.  Wound VAC to lower extremity. Skin: No rashes, lesions or ulcers Psychiatry: Judgement and insight appear normal. Mood & affect flat.     Data Reviewed: I have personally reviewed following labs and imaging studies  CBC: Recent Labs  Lab 03/03/20 1247 03/04/20 0335  03/09/20 0225 03/10/20 0215  WBC 9.7  --  25.1* 23.8*  NEUTROABS  --   --  24.6*  --   HGB 7.6* 7.8* 7.9* 7.4*  HCT 25.4* 25.3* 25.1* 24.2*  MCV 88.5  --  86.3 86.4  PLT 366  --  349 307    Basic Metabolic Panel: Recent Labs  Lab 03/09/20 0225 03/10/20 0215 03/10/20 1102  NA 134* 136  --   K 4.4 3.2*  --   CL 97* 103  --   CO2 23 22  --   GLUCOSE 178* 130*  --   BUN 43* 37*  --   CREATININE 1.68* 1.21*  --   CALCIUM 8.4* 8.0*  --   MG  --   --  1.8    GFR: Estimated Creatinine Clearance: 37.6 mL/min (A) (by C-G formula based on SCr of 1.21 mg/dL (H)).  Liver Function Tests: Recent Labs  Lab 03/09/20 0225  AST 23  ALT 17  ALKPHOS 136*  BILITOT 0.5  PROT 6.0*  ALBUMIN 2.2*    CBG: Recent Labs  Lab 03/04/20 1113 03/09/20 1211 03/09/20 1717 03/09/20 2127 03/10/20 0821  GLUCAP 121* 181* 148* 120* 118*     Recent Results (from the past 240 hour(s))  Urine culture     Status: None   Collection Time: 03/09/20  6:39 AM   Specimen: In/Out Cath Urine  Result Value Ref Range Status   Specimen Description IN/OUT CATH URINE  Final   Special Requests NONE  Final   Culture   Final    NO GROWTH Performed at [facility], [address], [place], [place]    Report Status 03/10/2020 FINAL  Final  SARS Coronavirus 2 by RT PCR (hospital order, performed in [facility] hospital lab) Nasopharyngeal Nasopharyngeal Swab     Status: None   Collection Time: 03/10/20  8:48 AM   Specimen: Nasopharyngeal Swab  Result Value Ref Range Status   SARS Coronavirus 2 NEGATIVE NEGATIVE Final    Comment: (NOTE) SARS-CoV-2 target nucleic acids are NOT DETECTED. The SARS-CoV-2 RNA is generally detectable in upper and lower respiratory specimens during the acute phase of infection. The lowest  concentration of SARS-CoV-2 viral copies this assay can detect is 250 copies / mL. A negative result does not preclude SARS-CoV-2 infection and should not be used as the sole  basis for treatment or other patient management decisions.  A negative result may occur with improper specimen collection / handling, submission of specimen other than nasopharyngeal swab, presence of viral mutation(s) within the areas targeted by this assay, and inadequate number of viral copies (<250 copies / mL). A negative result must be combined with clinical observations, patient history, and epidemiological information. Fact Sheet for Patients:   StrictlyIdeas.no Fact Sheet for Healthcare Providers: BankingDealers.co.za This test is not yet approved or cleared  by the [place] FDA and has been authorized for detection and/or diagnosis of SARS-CoV-2 by FDA under an Emergency Use Authorization (EUA).  This EUA will remain in effect (meaning this test can be used) for the duration of the COVID-19 declaration under Section 564(b)(1) of the Act, 21 U.S.C. section 360bbb-3(b)(1), unless the authorization is terminated or revoked sooner. Performed at [facility], [place] [address] [place], [place] [address]          Radiology Studies: CT ABDOMEN PELVIS WO CONTRAST  Result Date: 03/09/2020 CLINICAL DATA:  Vomiting. Constipation. Concern for bowel obstruction. EXAM: CT ABDOMEN AND PELVIS WITHOUT CONTRAST TECHNIQUE: Multidetector CT imaging of the abdomen and pelvis was performed following the standard protocol without IV contrast. COMPARISON:  None. FINDINGS: Lower chest: Small dependent left pleural effusion. Mild cardiomegaly. Intact lower sternotomy wires. Hepatobiliary: Normal liver size. No liver mass. Normal gallbladder with no radiopaque cholelithiasis. No biliary ductal dilatation. Pancreas: Normal, with no mass or duct dilation. Spleen: Normal size. No mass. Adrenals/Urinary Tract: No discrete adrenal nodules. No renal stones. No hydronephrosis. No contour deforming renal masses. Normal bladder. Stomach/Bowel: Normal  non-distended stomach. Normal caliber small bowel with no small bowel wall thickening. Normal appendix. Moderate diffuse colonic diverticulosis, most prominent in sigmoid colon, with no large bowel wall thickening or significant pericolonic fat stranding. Vascular/Lymphatic: Atherosclerotic nonaneurysmal abdominal aorta. No pathologically enlarged lymph nodes in the abdomen or pelvis. Reproductive: Coarsely calcified degenerated 1.6 cm uterine fibroid. No adnexal masses. Other: No pneumoperitoneum, ascites or focal fluid collection. Musculoskeletal: No aggressive appearing focal osseous lesions. Moderate lumbar spondylosis. IMPRESSION: 1. No acute abnormality. No evidence of bowel obstruction or acute bowel inflammation. Moderate diffuse colonic diverticulosis, with no evidence of acute diverticulitis. 2. Small dependent left pleural effusion. 3. Mild cardiomegaly. 4. Aortic Atherosclerosis (ICD10-I70.0). Electronically Signed   By: Ilona Sorrel M.D.   On: 03/09/2020 06:15   DG Chest Port 1 View  Result Date: 03/09/2020 CLINICAL DATA:  Lethargy EXAM: PORTABLE CHEST 1 VIEW COMPARISON:  None. FINDINGS: There is mild cardiomegaly. Overlying median sternotomy wires are present. Aortic knob calcifications are seen. A small left pleural effusion is present. The right lung is clear. No focal airspace consolidation. No acute osseous abnormality. IMPRESSION: Small left pleural effusion.  For Electronically Signed   By: Prudencio Pair M.D.   On: 03/09/2020 02:41        Scheduled Meds: . acetaminophen  650 mg Oral Q6H  . aspirin  325 mg Oral Daily  . atorvastatin  80 mg Oral Daily  . Chlorhexidine Gluconate Cloth  6 each Topical Daily  . docusate sodium  100 mg Oral BID  . enoxaparin (LOVENOX) injection  30 mg Subcutaneous Daily  . insulin aspart  0-5 Units Subcutaneous QHS  . insulin aspart  0-9 Units Subcutaneous TID WC  .  insulin glargine  10 Units Subcutaneous Daily  . loratadine  10 mg Oral Daily  .  polyethylene glycol  17 g Oral Daily  . potassium chloride  40 mEq Oral Q4H  . sodium chloride flush  3 mL Intravenous Q12H   Continuous Infusions: . lactated ringers 75 mL/hr at 03/10/20 1017     LOS: 0 days    Time spent: 35 minutes    Ramiro Harvestaniel Thomas Mabry, MD [facility]   To contact the attending provider between 7A-7P or the covering provider during after hours 7P-7A, please log into the web site [email or web address] and access using universal [place] password for that web site. If you do not have the password, please call the hospital operator.  03/10/2020, 12:15 PM

## 2020-03-10 NOTE — Progress Notes (Signed)
Occupational Therapy Evaluation Patient Details Name: Bianca [name] MRN: [id number] DOB: 07/25/1946 Today's Date: 03/10/2020    History of Present Illness 74 y.o. female with medical history significant of CAD s/p CABG; HTN; and DM presenting with lethargy.  She had CABG 1 month ago and was subsequently hospitalized from 5/17-25 for wound dehiscence and leg swelling. Pt with recent history of vomiting, limited P.O. intake, and weakness.   Clinical Impression   Prior to CABG surgery, pt was independent, driving and managing her own medications. Since her CABG, pt has demonstrated impaired cognition, however son states her thinking is worse now than when she discharge home recently/  Poor PO intake and limited participation with ADL tasks due to apparent cognitive deficits. Prior to most recent hospitalization, pt was able to use the [facility] and assist with ADL tasks. Pt currently requires Max A +2 for mobility and Max to total A for ADL. Son states she is dramatically different. Flat affect with apparent cognitive deficits. Pt oriented to self only and unable to state why she is in the hospital. Limited and very painful ROM RLE during bed mobility. Recommend rehab at SNF, however family prefers to take pt home. Pt will need 24/7 assistance in addition to equipment below. Recommend HHOT and a HH Aide. Educated son/daughter on importance of completing bed level exercises in addition to encouraging chair position in bed if pt declines to transfer OOB.  Will follow acutely. May benefit from use of Stedy for mobility.     Follow Up Recommendations  Home health OT;Supervision/Assistance - 24 hour(HH Aide)    Equipment Recommendations  Wheelchair (measurements OT);Wheelchair cushion (measurements OT);Hospital bed;Other (comment)(Hoyer)    Recommendations for Other Services       Precautions / Restrictions Precautions Precautions: Fall;Sternal Precaution Booklet Issued: No Precaution Comments: RLE  wound vac, cues for sternal precautions Restrictions Weight Bearing Restrictions: No       Mobility Bed Mobility Overal bed mobility: Needs Assistance Bed Mobility: Supine to Sit     Supine to sit: Max assist        Transfers                 General transfer comment: Pt declined; moaning and resisting movement this pm    Balance Overall balance assessment: Needs assistance Sitting-balance support: Bilateral upper extremity supported;Feet supported Sitting balance-Leahy Scale: Poor; posterior push       May benefit from use of Stedy                       ADL either performed or assessed with clinical judgement   ADL Overall ADL's : Needs assistance/impaired Eating/Feeding: Minimal assistance Eating/Feeding Details (indicate cue type and reason): poor PO intake Grooming: Moderate assistance   Upper Body Bathing: Moderate assistance;Bed level   Lower Body Bathing: Maximal assistance;Bed level   Upper Body Dressing : Maximal assistance;Bed level   Lower Body Dressing: Total assistance;Bed level       Toileting- Clothing Manipulation and Hygiene: Total assistance       Functional mobility during ADLs: Maximal assistance;+2 for physical assistance;Cueing for safety;Cueing for sequencing       Vision         Perception     Praxis      Pertinent Vitals/Pain Pain Assessment: Faces Faces Pain Scale: Hurts whole lot Pain Location: RLE with mobility Pain Descriptors / Indicators: Grimacing;Guarding;Moaning Pain Intervention(s): Limited activity within patient's tolerance     Hand Dominance Right  Extremity/Trunk Assessment Upper Extremity Assessment Upper Extremity Assessment: Generalized weakness   Lower Extremity Assessment Lower Extremity Assessment: Defer to PT evaluation;RLE deficits/detail RLE Deficits / Details: wound vac; limited ROM R knee; moaning with ROM; tightness noted R heel cord   Cervical / Trunk  Assessment Cervical / Trunk Assessment: Kyphotic   Communication Communication Communication: No difficulties   Cognition Arousal/Alertness: Awake/alert Behavior During Therapy: Flat affect Overall Cognitive Status: Impaired/Different from baseline Area of Impairment: Orientation;Attention;Memory;Following commands;Safety/judgement;Awareness;Problem solving                 Orientation Level: Disoriented to;Time;Situation;Place Current Attention Level: Focused Memory: Decreased recall of precautions;Decreased short-term memory Following Commands: Follows one step commands with increased time Safety/Judgement: Decreased awareness of safety;Decreased awareness of deficits Awareness: Intellectual Problem Solving: Slow processing;Requires verbal cues General Comments: pt with no recollection of having CABG and not awareness of sternal precautions; unable to name any of her grandchildren; unsure why she is in the hospital; apparenlty confused   General Comments  +2 Assistance would be needed for safe mobility; pt resistive to movement further than bed mobility; poor eye contact; not very verbal    Exercises Exercises: General Lower Extremity General Exercises - Lower Extremity Ankle Circles/Pumps: AROM;10 reps Quad Sets: AROM;10 reps Heel Slides: Left;5 reps;Supine Straight Leg Raises: AAROM;Both;10 reps;Supine   Shoulder Instructions      Home Living Family/patient expects to be discharged to:: Private residence Living Arrangements: Children Available Help at Discharge: Family;Available 24 hours/day Type of Home: Apartment Home Access: Stairs to enter Entrance Stairs-Number of Steps: 3(2 other 1 step curbs to ascend) Entrance Stairs-Rails: Right Home Layout: One level     Bathroom Shower/Tub: Chief Strategy Officer: Standard Bathroom Accessibility: Yes How Accessible: Accessible via walker Home Equipment: Tub bench;Walker - 2 wheels;Bedside commode           Prior Functioning/Environment Level of Independence: Independent        Comments: independent prior to CABG, was ambulating with RW since most recent discharge home; prior to CABG pt was driving and independent with madicaiton management        OT Problem List: Decreased strength;Decreased range of motion;Impaired balance (sitting and/or standing);Decreased activity tolerance;Decreased coordination;Decreased cognition;Decreased safety awareness;Decreased knowledge of use of DME or AE;Decreased knowledge of precautions;Cardiopulmonary status limiting activity;Pain      OT Treatment/Interventions: Self-care/ADL training;Therapeutic exercise;Neuromuscular education;Energy conservation;DME and/or AE instruction;Therapeutic activities;Cognitive remediation/compensation;Patient/family education;Balance training    OT Goals(Current goals can be found in the care plan section) Acute Rehab OT Goals Patient Stated Goal: none stated; per familyi for pt to get stronger OT Goal Formulation: With patient/family Time For Goal Achievement: 03/24/20 Potential to Achieve Goals: Good  OT Frequency: Min 2X/week   Barriers to D/C:            Co-evaluation              AM-PAC OT "6 Clicks" Daily Activity     Outcome Measure Help from another person eating meals?: A Little Help from another person taking care of personal grooming?: A Little Help from another person toileting, which includes using toliet, bedpan, or urinal?: Total Help from another person bathing (including washing, rinsing, drying)?: A Lot Help from another person to put on and taking off regular upper body clothing?: A Lot Help from another person to put on and taking off regular lower body clothing?: Total 6 Click Score: 12   End of Session Nurse Communication: Mobility status;Need for lift equipment  Activity  Tolerance: Patient limited by pain Patient left: in bed;with call bell/phone within reach;with bed alarm  set;with family/visitor present(chair position)  OT Visit Diagnosis: Unsteadiness on feet (R26.81);Other abnormalities of gait and mobility (R26.89);Muscle weakness (generalized) (M62.81);Other symptoms and signs involving cognitive function;Pain Pain - Right/Left: Right Pain - part of body: Leg                Time: 8938-1017 OT Time Calculation (min): 29 min Charges:  OT General Charges $OT Visit: 1 Visit OT Evaluation $OT Eval Moderate Complexity: 1 Mod OT Treatments $Self Care/Home Management : 8-22 mins  Luisa Dago, OT/L   Acute OT Clinical Specialist Acute Rehabilitation Services Pager [phone] Office [phone]   [facility] 03/10/2020, 4:05 PM

## 2020-03-10 NOTE — TOC Initial Note (Addendum)
Transition of Care [facility]) - Initial/Assessment Note    Patient Details  Name: Bianca [name] MRN: [id number] Date of Birth: October 15, 1945  Transition of Care [facility]) CM/SW Contact:    Marilu Favre, RN Phone Number: 03/10/2020, 2:44 PM  Clinical Narrative:                  Spoke to patient and son Bianca [name] and daughter Bianca [name] at bedside. Discussed PT recommendation for SNF with son daughter and patient. Patient declined. Family agreeable to take her home and assist. All three agreeable to hospital bed, hoyer lift and wheel chair. Bianca [name] is contact person for delivery. DME agencies closed today. Messaged MD for orders for DME and Home Health.    Confirmed address with patient and family. Patient may need PTAR home.  Has VAC from [place] at home already   Expected Discharge Plan: [place] Barriers to Discharge: Continued Medical Work up   Patient Goals and CMS Choice Patient states their goals for this hospitalization and ongoing recovery are:: Patient declining SNF , Daughter [name] and son Bianca [name] both at bedside CMS Medicare.gov Compare Post Acute Care list provided to:: Patient Choice offered to / list presented to : Patient, Adult Children  Expected Discharge Plan and Services Expected Discharge Plan: [place]   Discharge Planning Services: CM Consult Post Acute Care Choice: Home Health, Durable Medical Equipment Living arrangements for the past 2 months: Single Family Home                 DME Arranged: Hospital bed, Lightweight manual wheelchair with seat cushion(hoyer)         HH Arranged: OT, PT, RN          Prior Living Arrangements/Services Living arrangements for the past 2 months: Single Family Home Lives with:: Adult Children Patient language and need for interpreter reviewed:: Yes Do you feel safe going back to the place where you live?: Yes      Need for Family Participation in Patient Care: Yes (Comment) Care giver  support system in place?: Yes (comment) Current home services: DME Criminal Activity/Legal Involvement Pertinent to Current Situation/Hospitalization: No - Comment as needed  Activities of Daily Living      Permission Sought/Granted Permission sought to share information with : Case Manager, Customer service manager, Family Supports Permission granted to share information with : Yes, Verbal Permission Granted  Share Information with NAME: daughter Bianca [name], son Bianca [name]  Permission granted to share info w AGENCY: [facility]  Permission granted to share info w Relationship: daughter  Permission granted to share info w Contact Information: Bianca [name] [phone]  Emotional Assessment Appearance:: Appears stated age Attitude/Demeanor/Rapport: Gracious Affect (typically observed): Calm Orientation: : Oriented to Self, Oriented to Place, Oriented to  Time, Oriented to Situation Alcohol / Substance Use: Not Applicable Psych Involvement: No (comment)  Admission diagnosis:  AKI (acute kidney injury) ([place]) [N17.9] Hypotension due to hypovolemia [I95.89, E86.1] Patient Active Problem List   Diagnosis Date Noted  . Hypotension due to hypovolemia 03/09/2020  . Constipation 03/09/2020  . AKI (acute kidney injury) ([place]) 03/09/2020  . Malnutrition of moderate degree 02/29/2020  . Wound dehiscence 02/25/2020  . S/P CABG x 4 02/08/2020  . Coronary artery disease 02/08/2020  . Chest pain 02/03/2020  . Unstable angina ([place]) 02/03/2020  . Hypokalemia 02/03/2020  . CAD (coronary artery disease) 01/31/2020  . Pain due to onychomycosis of toenails of both feet 01/30/2020  . Chest pain of uncertain  etiology 01/29/2020  . Non-STEMI (non-ST elevated myocardial infarction) (HCC) 01/29/2020  . Essential hypertension 01/29/2020  . Hyperlipidemia 01/29/2020  . Acute diastolic CHF (congestive heart failure) (HCC) 01/06/2020  . CHF (congestive heart failure) (HCC) 01/06/2020  . Noncompliance with  medication regimen 08/17/2018  . Noncompliance with diagnostic testing 08/17/2018  . Type 2 diabetes mellitus (HCC) 04/17/2008  . TOBACCO ABUSE 04/17/2008   PCP:  Luciano Cutter, PA-C Pharmacy:   [id number] [place], [place] - [phone] AT [facility] OF [facility] & [address] [place] [address] Phone: [phone] Fax: [phone]     Social Determinants of Health (SDOH) Interventions    Readmission Risk Interventions Readmission Risk Prevention Plan 03/04/2020 02/15/2020 02/14/2020  Transportation Screening Complete Complete -  PCP or Specialist Appt within 5-7 Days Complete Complete -  Home Care Screening Complete Complete -  Medication Review (RN CM) Complete Complete -  HRI or Home Care Consult - - Complete  Social Work Consult for Recovery Care Planning/Counseling - - Complete  Palliative Care Screening - - Not Applicable  Some recent data might be hidden

## 2020-03-10 NOTE — Evaluation (Signed)
Physical Therapy Evaluation Patient Details Name: Bianca [name] MRN: [id number] DOB: 25-Oct-1945 Today's Date: 03/10/2020   History of Present Illness  74 y.o. female with medical history significant of CAD s/p CABG; HTN; and DM presenting with lethargy.  She had CABG 1 month ago and was subsequently hospitalized from 5/17-25 for wound dehiscence and leg swelling. Pt with recent history of vomiting, limited P.O. intake, and weakness.  Clinical Impression  Pt presents to PT with deficits in functional mobility, gait, balance, endurance, strength, power, cognition. Pt is generally weak with significant pain in RLE with minimal movement. Pt requires maxA for all functional mobility at this time. Pt is at a high falls risk due to weakness, impaired mobility, and poor cognition. Pt continues to demonstrate no recall of sternal precautions at this time. PT discusses discharge options with the patient and the pt's daughter, pt's daughter reports they would like to discharge home rather than to SNF. PT thus recommends home health PT, a hospital bed, mechanical lift, and wheelchair.    Follow Up Recommendations SNF;Supervision - Intermittent(family refusing SNF, will need HHPT)    Equipment Recommendations  Hospital bed;Other (comment);Wheelchair (measurements PT);Wheelchair cushion (measurements PT)(mechanical lift)    Recommendations for Other Services       Precautions / Restrictions Precautions Precautions: Fall;Sternal Precaution Booklet Issued: No Precaution Comments: RLE wound vac, cues for sternal precautions Restrictions Weight Bearing Restrictions: No RUE Partial Weight Bearing Percentage or Pounds: sternal Precautions LUE Partial Weight Bearing Percentage or Pounds: sternal precaution      Mobility  Bed Mobility Overal bed mobility: Needs Assistance Bed Mobility: Supine to Sit     Supine to sit: Max assist     General bed mobility comments: PT provides cues to limit  pushing/pulling with UEs  Transfers Overall transfer level: Needs assistance Equipment used: 1 person hand held assist Transfers: Sit to/from [employer] Sit to Stand: Max assist Stand pivot transfers: Max assist       General transfer comment: pt requires significant assistance to power up into standing, PT facilitation to aide in stepping to recliner as pt not wanting to advance RLE initially  Ambulation/Gait                Stairs            Wheelchair Mobility    Modified Rankin (Stroke Patients Only)       Balance Overall balance assessment: Needs assistance Sitting-balance support: Bilateral upper extremity supported;Feet supported Sitting balance-Leahy Scale: Poor Sitting balance - Comments: minA-minG with UE support of bed   Standing balance support: Bilateral upper extremity supported Standing balance-Leahy Scale: Poor Standing balance comment: modA to maintain static standing balance with BUE support of PT                             Pertinent Vitals/Pain Pain Assessment: Faces Faces Pain Scale: Hurts whole lot Pain Location: RLE with mobility Pain Descriptors / Indicators: Grimacing;Guarding;Moaning Pain Intervention(s): Monitored during session    Home Living Family/patient expects to be discharged to:: Private residence Living Arrangements: Children Available Help at Discharge: Family;Available 24 hours/day Type of Home: Apartment Home Access: Stairs to enter Entrance Stairs-Rails: Right Entrance Stairs-Number of Steps: 3(2 other 1 step curbs to ascend) Home Layout: One level Home Equipment: Tub bench;Walker - 2 wheels;Bedside commode      Prior Function Level of Independence: Independent         Comments: independent  prior to CABG, was ambulating with RW since most recent discharge home     Hand Dominance   Dominant Hand: Right    Extremity/Trunk Assessment   Upper Extremity Assessment Upper  Extremity Assessment: Generalized weakness    Lower Extremity Assessment Lower Extremity Assessment: Generalized weakness(RLE 2-/5. LLE 2/5 grossly)    Cervical / Trunk Assessment Cervical / Trunk Assessment: Kyphotic  Communication   Communication: No difficulties  Cognition Arousal/Alertness: Awake/alert Behavior During Therapy: Flat affect Overall Cognitive Status: Impaired/Different from baseline Area of Impairment: Orientation;Attention;Memory;Following commands;Safety/judgement;Awareness;Problem solving                 Orientation Level: Disoriented to;Time;Situation(pt requires 2 chances for place) Current Attention Level: Focused Memory: Decreased recall of precautions;Decreased short-term memory Following Commands: Follows one step commands with increased time Safety/Judgement: Decreased awareness of safety;Decreased awareness of deficits Awareness: Intellectual Problem Solving: Slow processing;Requires verbal cues General Comments: pt with no recollection of sternal precautions      General Comments General comments (skin integrity, edema, etc.): VSS on RA, RLE wound vac    Exercises General Exercises - Lower Extremity Ankle Circles/Pumps: AROM;10 reps Quad Sets: AROM;10 reps Gluteal Sets: AROM;10 reps   Assessment/Plan    PT Assessment Patient needs continued PT services  PT Problem List Decreased strength;Decreased activity tolerance;Decreased balance;Decreased mobility;Decreased cognition;Decreased knowledge of use of DME;Decreased safety awareness;Decreased knowledge of precautions;Cardiopulmonary status limiting activity;Pain       PT Treatment Interventions DME instruction;Stair training;Gait training;Functional mobility training;Therapeutic activities;Therapeutic exercise;Balance training;Neuromuscular re-education;Cognitive remediation;Patient/family education;Wheelchair mobility training    PT Goals (Current goals can be found in the Care Plan  section)  Acute Rehab PT Goals Patient Stated Goal: to return home and improve mobility PT Goal Formulation: With patient/family Time For Goal Achievement: 03/24/20 Potential to Achieve Goals: Fair    Frequency Min 3X/week(family refusing SNF at this time)   Barriers to discharge        Co-evaluation               AM-PAC PT "6 Clicks" Mobility  Outcome Measure Help needed turning from your back to your side while in a flat bed without using bedrails?: A Lot Help needed moving from lying on your back to sitting on the side of a flat bed without using bedrails?: Total Help needed moving to and from a bed to a chair (including a wheelchair)?: Total Help needed standing up from a chair using your arms (e.g., wheelchair or bedside chair)?: Total Help needed to walk in hospital room?: Total Help needed climbing 3-5 steps with a railing? : Total 6 Click Score: 7    End of Session   Activity Tolerance: Patient limited by pain Patient left: in chair;with call bell/phone within reach;with chair alarm set Nurse Communication: Mobility status;Need for lift equipment PT Visit Diagnosis: Muscle weakness (generalized) (M62.81);Unsteadiness on feet (R26.81)    Time: 8315-1761 PT Time Calculation (min) (ACUTE ONLY): 26 min   Charges:   PT Evaluation $PT Eval Moderate Complexity: 1 Mod PT Treatments $Therapeutic Activity: 8-22 mins        Zenaida Niece, PT, DPT Acute Rehabilitation Pager: [phone]   Zenaida Niece 03/10/2020, 12:48 PM

## 2020-03-10 NOTE — Progress Notes (Signed)
Patient refused CBG checking for the second time. Will try again later.

## 2020-03-10 NOTE — Care Management (Cosign Needed)
    Durable Medical Equipment  (From admission, onward)         Start     Ordered   03/10/20 1454  For home use only DME Hospital bed  Once    Comments: Call son Montine Circle [phone] for delivery  Ht 5'6", wt 64 kg  Question Answer Comment  Length of Need Lifetime   Patient has (list medical condition): Hypotension due to hypovolemia   The above medical condition requires: Patient requires the ability to reposition frequently   Head must be elevated greater than: 45 degrees   Bed type Semi-electric   Hoyer Lift Yes   Support Surface: Gel Overlay      03/10/20 1454   03/10/20 1452  For home use only DME lightweight manual wheelchair with seat cushion  Once    Comments: Call son Montine Circle [phone] for delivery   Ht 5'6" and wt 64 kg     Patient suffers from  Hypotension due to hypovolemia which impairs their ability to perform daily activities like ambulating  in the home.  A cane  will not resolve  issue with performing activities of daily living. A wheelchair will allow patient to safely perform daily activities. Patient is not able to propel themselves in the home using a standard weight wheelchair due to  Hypotension due to hypovolemia. Patient can self propel in the lightweight wheelchair. Length of need lifetime . Accessories: elevating leg rests (ELRs), wheel locks, extensions and anti-tippers.  Seat and back cushions   03/10/20 1454

## 2020-03-10 NOTE — Progress Notes (Signed)
Patient refused labs again, MD present at that time and aware.

## 2020-03-10 NOTE — Progress Notes (Signed)
Patient consistently refuses 10 am medications despite explanation of importance of each medication. Family at bedside tried to explain and encourage patient and yet refuses. Will continue to monitor patient.

## 2020-03-10 NOTE — Progress Notes (Signed)
Patient refused CBG and VS check as well as PO medications despite explanation, flat affect.  Last CBG=104 at 1352 and has IVF D5NS at 2ml/hr.  Will monitor for S&S of hypoglycemia

## 2020-03-11 LAB — CBC WITH DIFFERENTIAL/PLATELET
Abs Immature Granulocytes: 1.08 10*3/uL — ABNORMAL HIGH (ref 0.00–0.07)
Basophils Absolute: 0 10*3/uL (ref 0.0–0.1)
Basophils Relative: 0 %
Eosinophils Absolute: 0 10*3/uL (ref 0.0–0.5)
Eosinophils Relative: 0 %
HCT: 23.8 % — ABNORMAL LOW (ref 36.0–46.0)
Hemoglobin: 7.4 g/dL — ABNORMAL LOW (ref 12.0–15.0)
Immature Granulocytes: 5 %
Lymphocytes Relative: 5 %
Lymphs Abs: 1.1 10*3/uL (ref 0.7–4.0)
MCH: 26.7 pg (ref 26.0–34.0)
MCHC: 31.1 g/dL (ref 30.0–36.0)
MCV: 85.9 fL (ref 80.0–100.0)
Monocytes Absolute: 0.7 10*3/uL (ref 0.1–1.0)
Monocytes Relative: 3 %
Neutro Abs: 18.1 10*3/uL — ABNORMAL HIGH (ref 1.7–7.7)
Neutrophils Relative %: 87 %
Platelets: 306 10*3/uL (ref 150–400)
RBC: 2.77 MIL/uL — ABNORMAL LOW (ref 3.87–5.11)
RDW: 16.5 % — ABNORMAL HIGH (ref 11.5–15.5)
WBC: 21 10*3/uL — ABNORMAL HIGH (ref 4.0–10.5)
nRBC: 0.1 % (ref 0.0–0.2)

## 2020-03-11 LAB — BASIC METABOLIC PANEL
Anion gap: 10 (ref 5–15)
BUN: 24 mg/dL — ABNORMAL HIGH (ref 8–23)
CO2: 22 mmol/L (ref 22–32)
Calcium: 8.1 mg/dL — ABNORMAL LOW (ref 8.9–10.3)
Chloride: 108 mmol/L (ref 98–111)
Creatinine, Ser: 1.04 mg/dL — ABNORMAL HIGH (ref 0.44–1.00)
GFR calc Af Amer: >60 mL/min (ref >60)
GFR calc non Af Amer: 53 mL/min — ABNORMAL LOW (ref >60)
Glucose, Bld: 234 mg/dL — ABNORMAL HIGH (ref 70–99)
Potassium: 3.4 mmol/L — ABNORMAL LOW (ref 3.5–5.1)
Sodium: 140 mmol/L (ref 135–145)

## 2020-03-11 LAB — GLUCOSE, CAPILLARY
Glucose-Capillary: 188 mg/dL — ABNORMAL HIGH (ref 70–99)
Glucose-Capillary: 226 mg/dL — ABNORMAL HIGH (ref 70–99)
Glucose-Capillary: 217 mg/dL — ABNORMAL HIGH (ref 70–99)
Glucose-Capillary: 147 mg/dL — ABNORMAL HIGH (ref 70–99)

## 2020-03-11 LAB — MAGNESIUM: Magnesium: 2 mg/dL (ref 1.7–2.4)

## 2020-03-11 MED ORDER — POTASSIUM CHLORIDE 20 MEQ PO PACK
40.0000 meq | PACK | Freq: Once | ORAL | Status: AC
  Administered 2020-03-11: 40 meq via ORAL
  Filled 2020-03-11: qty 2

## 2020-03-11 MED ORDER — BISACODYL 10 MG RE SUPP
10.0000 mg | Freq: Every day | RECTAL | Status: DC
  Administered 2020-03-11 – 2020-03-22 (×2): 10 mg via RECTAL
  Filled 2020-03-11 (×6): qty 1

## 2020-03-11 NOTE — Progress Notes (Signed)
Physical Therapy Treatment Patient Details Name: Bianca [name] MRN: [id number] DOB: 1945-12-23 Today's Date: 03/11/2020    History of Present Illness 74 y.o. female with medical history significant of CAD s/p CABG; HTN; and DM presenting with lethargy.  She had CABG 1 month ago and was subsequently hospitalized from 5/17-25 for wound dehiscence and leg swelling. Pt with recent history of vomiting, limited P.O. intake, and weakness.    PT Comments    Pt was seen for mobility attempt, but would agree to ex's due to fatigue from her previous mobility today.  Pt is reasonably able to help move and tolerate assist on RLE, with vac looking fairly dry today.  Pt was assisted to replace RLE on pillow, no increased pain complaints with any work today.  Follow up with her as stay allows, expecting to go home but PT still recommending SNF due to her severely declined state since last admission.   Follow Up Recommendations  SNF     Equipment Recommendations  Hospital bed;Other (comment);Wheelchair (measurements PT);Wheelchair cushion (measurements PT)(mechanical lift for home)    Recommendations for Other Services       Precautions / Restrictions Precautions Precautions: Fall;Sternal Precaution Booklet Issued: No Precaution Comments: RLE wound vac, sternal cues Restrictions Weight Bearing Restrictions: Yes RUE Weight Bearing: Partial weight bearing RUE Partial Weight Bearing Percentage or Pounds: sternal precaution LUE Weight Bearing: Partial weight bearing LUE Partial Weight Bearing Percentage or Pounds: sternal precaution    Mobility  Bed Mobility Overal bed mobility: Needs Assistance             General bed mobility comments: pt was repositioned in place, too fatigued to get up to side of bed  Transfers                 General transfer comment: declined to try  Ambulation/Gait                 Stairs             Wheelchair Mobility    Modified  Rankin (Stroke Patients Only)       Balance                                            Cognition Arousal/Alertness: Awake/alert Behavior During Therapy: Flat affect Overall Cognitive Status: Impaired/Different from baseline Area of Impairment: Awareness;Safety/judgement;Following commands                 Orientation Level: Time Current Attention Level: Selective Memory: Decreased short-term memory Following Commands: Follows one step commands inconsistently;Follows one step commands with increased time Safety/Judgement: Decreased awareness of deficits Awareness: Intellectual   General Comments: pt was fairly non verbal wtih her daughter intervening with her      Exercises General Exercises - Lower Extremity Ankle Circles/Pumps: AAROM;5 reps Quad Sets: AROM;10 reps Gluteal Sets: AROM;10 reps Heel Slides: AROM;AAROM;10 reps Hip ABduction/ADduction: AROM;AAROM;10 reps    General Comments        Pertinent Vitals/Pain Pain Assessment: 0-10 Pain Score: 10-Worst pain ever Pain Location: RLE with mobility Pain Descriptors / Indicators: Guarding;Grimacing;Operative site guarding Pain Intervention(s): Limited activity within patient's tolerance;Monitored during session;Premedicated before session;Repositioned    Home Living                      Prior Function  PT Goals (current goals can now be found in the care plan section) Acute Rehab PT Goals Patient Stated Goal: none reported Progress towards PT goals: Not progressing toward goals - comment    Frequency    Min 3X/week      PT Plan Current plan remains appropriate    Co-evaluation              AM-PAC PT "6 Clicks" Mobility   Outcome Measure  Help needed turning from your back to your side while in a flat bed without using bedrails?: A Lot Help needed moving from lying on your back to sitting on the side of a flat bed without using bedrails?: Total Help  needed moving to and from a bed to a chair (including a wheelchair)?: Total Help needed standing up from a chair using your arms (e.g., wheelchair or bedside chair)?: Total Help needed to walk in hospital room?: Total Help needed climbing 3-5 steps with a railing? : Total 6 Click Score: 7    End of Session   Activity Tolerance: Patient limited by fatigue;Patient limited by pain Patient left: in bed;with call bell/phone within reach;with bed alarm set;with family/visitor present;with nursing/sitter in room Nurse Communication: Mobility status PT Visit Diagnosis: Muscle weakness (generalized) (M62.81);Unsteadiness on feet (R26.81)     Time: 6144-3154 PT Time Calculation (min) (ACUTE ONLY): 28 min  Charges:  $Therapeutic Exercise: 23-37 mins                     Ramond Dial 03/11/2020, 1:16 PM  Mee Hives, PT MS Acute Rehab Dept. Number: [place]

## 2020-03-11 NOTE — Progress Notes (Signed)
PROGRESS NOTE    Bianca [name]  OVZ:[id number] DOB: [birth year] DOA: 03/09/2020 PCP: Luciano Cutter, PA-C    No chief complaint on file.   Brief Narrative: HPI per Dr. Keenan Bachelor Bianca [name] is a 74 y.o. female with medical history significant of CAD s/p CABG; HTN; and DM presenting with lethargy.  She had CABG 1 month ago and was subsequently hospitalized from 5/17-25 for wound dehiscence and leg swelling.   She was recently discharged.  Yesterday, she got up and and had a sponge bath.  She went to the living room to eat and she vomited.  They gave her dramamine, Pepto and a protein shak.  About 6pm, she was constipated and and needed help with manual disimpaction.  She had not voided all day and had small BM.  She was weak but able to stand.  She tried to eat and vomited again.  They went to bed about 10pm and it took 2 hours to get there and she wasn't able to stand and get there.  Finally, they decided to call 911.  She last ate well Friday AM.  She had been doing well when she left the hospital and this was an acute change, other than constipation.  She gave her Miralax, pedialyte, and water with some passage of stool.      ED Course:  Hypovolemia, decreased PO intake, emesis.  Has CABG in March and recent hospitalization for wound dehiscence from harvest site. Wound vac in place and wound looks ok.  Hypotension, SBP in 80s.  Decreased appetite, not eating well, some emesis, mild abdominal discomfort. CT abdomen unremarkable.  BP is fluid responsive.  Assessment & Plan:   Principal Problem:   Hypotension due to hypovolemia Active Problems:   Type 2 diabetes mellitus (HCC)   Essential hypertension   Hyperlipidemia   CAD (coronary artery disease)   Wound dehiscence   Constipation   AKI (acute kidney injury) (HCC)   Leukocytosis   1 hypotension Likely secondary to hypovolemia secondary to poor oral intake.  Patient with recent prior hospitalization associated with ACS  and then harvest wound dehiscence.  Patient noted to have developed constipation which was felt likely causing her to limit her oral intake leading to dehydration and generalized weakness.  Patient noted to have been refusing to take her medications.  Blood pressure improving with hydration.  Infectious work-up negative to date.  Urinalysis unremarkable.  Chest x-ray negative.  Blood cultures ordered however patient refused and blood cultures reordered today.  Per daughter patient now agreeing for oral intake.  Patient noted to be eating approximately 25% of her meals.  Continue IV fluids.  Supportive care.  2.  Acute kidney injury Secondary to prerenal azotemia in the setting of poor oral intake, NSAID use, diuretics, ARB.  Patient noted to be hypotensive on admission.  Hypotension responding to IV fluids. Urinalysis nitrite negative, leukocytes negative, 100 protein.  Renal function improving with hydration.  Continue IV fluids.  Supportive care.   3.  Hypertension Continue to hold antihypertensive medications secondary to #1.    4.  Constipation Patient having bowel movement on regimen of MiraLAX daily and Colace.  CT abdomen and pelvis negative for any acute abnormalities.  Continue current bowel regimen.  Supportive care.  5.  Dehydration IV fluids.  6.  Diabetes mellitus type 2 Hemoglobin A1c 7.1 (02/25/2020).  CBG of 104 this morning.  Patient with poor oral intake.  Change IV fluids back to normal saline as CBGs  this afternoon in the 200s.  Continue heart healthy diet.  Supportive care.  7.  Failure to thrive Patient refusing oral intake.  Patient with poor oral intake at present with hypotension felt secondary to poor p.o. intake.  Patient states no appetite.  CT abdomen and pelvis which was done negative for any acute abnormalities.  Patient with a leukocytosis however afebrile, chest x-ray negative for any acute infiltrates.  Urinalysis is unremarkable.  Blood cultures ordered but patient  refused.  Continue IV fluids with normal saline.  Per daughter patient now agreeing to eat and about 25% of her meals.  Continue hydration with IV fluids for another 24 hours.  Follow.  8.  Hypokalemia Magnesium level at 2.  Potassium at 3.4.  K. Dur 40 mEq p.o. x1.  Follow.  7.  Iron deficiency anemia/anemia of chronic disease Patient with anemia with hemoglobin of 7.4.  Patient with weakness and hypotension.  Anemia panel with a a iron level of 16, TIBC of 196.  Follow H&H.  Patient received a dose of IV Feraheme.  Transfusion threshold hemoglobin < 7.   8.  Hyperlipidemia Continue statin.  9.  Coronary artery disease with recent CABG Continue aspirin. ARB, diuretics on hold due to hypotension.  Outpatient follow-up with cardiology.  10.  Recent wound dehiscence Wound VAC in place.  Patient noted to have ongoing pain.  Patient noted to be taking Tylenol 650 mg every 6 hours standing dose.  Advil discontinued due to acute kidney injury.  Patient noted to have declined other pain medications at this time.  Due to limited mobility PT OT ordered and recommended SNF however patient noted to be refusing SNF and wanted to go home with home health.  Home health therapies have been ordered.  Patient has been seen by the wound care nurse.  Recommendations made.   11.  Leukocytosis Likely reactive leukocytosis.  Patient afebrile.  Urinalysis unremarkable.  Chest x-ray negative for any acute infiltrates.  Patient with no respiratory symptoms.  Blood cultures ordered however patient refused yesterday.  Blood cultures were reordered today.  No need for antibiotics at this time.  Follow.   DVT prophylaxis: Lovenox Code Status: Full Family Communication: Updated daughter at bedside. Disposition:   Status is: Observation    Dispo: The patient is from: Home              Anticipated d/c is to: Home with home health therapies              Anticipated d/c date is: 1 to 2 days              Patient  currently with poor oral intake, has been refusing oral intake however started with some oral intake and taking her medications.  Patient presented with constipation, on IV fluids for hypotension.        Consultants:   Wound care  Procedures:  CT abdomen and pelvis 03/09/2020  Chest x-ray 03/09/2020    Antimicrobials:   None   Subjective: Patient was admitted with poor oral intake.  Tolerating some oral intake today and not refusing food or medications today per daughter who is at bedside.  Patient denies any shortness of breath.  Patient with some chest discomfort which is reproducible.  No nausea or emesis.  Noted to be having bowel movement.  Patient noted to have refused labs this morning.  Objective: Vitals:   03/09/20 1408 03/09/20 2130 03/10/20 0136 03/10/20 0427  BP: (!) 111/48 Marland Kitchen)  97/46 (!) 112/54 (!) 105/48  Pulse: 80 82 81 78  Resp: 20 17 18 16   Temp:   98.2 F (36.8 C) 98.5 F (36.9 C)  TempSrc: Oral Oral Oral Oral  SpO2: 96% 98% 97% 98%  Weight:      Height:        Intake/Output Summary (Last 24 hours) at 03/11/2020 1257 Last data filed at 03/11/2020 0900 Gross per 24 hour  Intake 1381.5 ml  Output 525 ml  Net 856.5 ml   Filed Weights   03/09/20 0209  Weight: 64 kg    Examination:  General exam: NAD Respiratory system: CTAB.  No wheezes, no crackles, no rhonchi.  Normal respiratory effort.  Cardiovascular system: Regular rate and rhythm no murmurs rubs or gallops.  No JVD.  Right lower extremity with 1-2+ edema.  Midsternal wound. Gastrointestinal system: Abdomen is soft, nontender, nondistended, positive bowel sounds.  No rebound.  No guarding.  Central nervous system: Alert and oriented. No focal neurological deficits. Extremities: Symmetric 5 x 5 power.  Wound VAC to right lower extremity.  Right lower extremity with 1-2+ edema Skin: No rashes, lesions or ulcers Psychiatry: Judgement and insight appear normal. Mood & affect flat.     Data  Reviewed: I have personally reviewed following labs and imaging studies  CBC: Recent Labs  Lab 03/09/20 0225 03/10/20 0215 03/11/20 0919  WBC 25.1* 23.8* 21.0*  NEUTROABS 24.6*  --  18.1*  HGB 7.9* 7.4* 7.4*  HCT 25.1* 24.2* 23.8*  MCV 86.3 86.4 85.9  PLT 349 307 306    Basic Metabolic Panel: Recent Labs  Lab 03/09/20 0225 03/10/20 0215 03/10/20 1102 03/11/20 0919  NA 134* 136  --  140  K 4.4 3.2*  --  3.4*  CL 97* 103  --  108  CO2 23 22  --  22  GLUCOSE 178* 130*  --  234*  BUN 43* 37*  --  24*  CREATININE 1.68* 1.21*  --  1.04*  CALCIUM 8.4* 8.0*  --  8.1*  MG  --   --  1.8 2.0    GFR: Estimated Creatinine Clearance: 43.8 mL/min (A) (by C-G formula based on SCr of 1.04 mg/dL (H)).  Liver Function Tests: Recent Labs  Lab 03/09/20 0225  AST 23  ALT 17  ALKPHOS 136*  BILITOT 0.5  PROT 6.0*  ALBUMIN 2.2*    CBG: Recent Labs  Lab 03/09/20 2127 03/10/20 0821 03/10/20 1352 03/11/20 0743 03/11/20 1232  GLUCAP 120* 118* 104* 188* 226*     Recent Results (from the past 240 hour(s))  Urine culture     Status: None   Collection Time: 03/09/20  6:39 AM   Specimen: In/Out Cath Urine  Result Value Ref Range Status   Specimen Description IN/OUT CATH URINE  Final   Special Requests NONE  Final   Culture   Final    NO GROWTH Performed at [facility], [address] [place], [place]    Report Status 03/10/2020 FINAL  Final  SARS Coronavirus 2 by RT PCR (hospital order, performed in [facility] hospital lab) Nasopharyngeal Nasopharyngeal Swab     Status: None   Collection Time: 03/10/20  8:48 AM   Specimen: Nasopharyngeal Swab  Result Value Ref Range Status   SARS Coronavirus 2 NEGATIVE NEGATIVE Final    Comment: (NOTE) SARS-CoV-2 target nucleic acids are NOT DETECTED. The SARS-CoV-2 RNA is generally detectable in upper and lower respiratory specimens during the acute  phase of infection. The lowest concentration of SARS-CoV-2 viral  copies this assay can detect is 250 copies / mL. A negative result does not preclude SARS-CoV-2 infection and should not be used as the sole basis for treatment or other patient management decisions.  A negative result may occur with improper specimen collection / handling, submission of specimen other than nasopharyngeal swab, presence of viral mutation(s) within the areas targeted by this assay, and inadequate number of viral copies (<250 copies / mL). A negative result must be combined with clinical observations, patient history, and epidemiological information. Fact Sheet for Patients:   StrictlyIdeas.no Fact Sheet for Healthcare Providers: BankingDealers.co.za This test is not yet approved or cleared  by the [place] FDA and has been authorized for detection and/or diagnosis of SARS-CoV-2 by FDA under an Emergency Use Authorization (EUA).  This EUA will remain in effect (meaning this test can be used) for the duration of the COVID-19 declaration under Section 564(b)(1) of the Act, 21 U.S.C. section 360bbb-3(b)(1), unless the authorization is terminated or revoked sooner. Performed at [facility], [place] [address], [place], [place] [address]          Radiology Studies: No results found.      Scheduled Meds: . acetaminophen  650 mg Oral Q6H  . aspirin  325 mg Oral Daily  . atorvastatin  80 mg Oral Daily  . bisacodyl  10 mg Rectal Daily  . Chlorhexidine Gluconate Cloth  6 each Topical Daily  . docusate sodium  100 mg Oral BID  . enoxaparin (LOVENOX) injection  40 mg Subcutaneous Daily  . insulin aspart  0-5 Units Subcutaneous QHS  . insulin aspart  0-9 Units Subcutaneous TID WC  . insulin glargine  10 Units Subcutaneous Daily  . loratadine  10 mg Oral Daily  . polyethylene glycol  17 g Oral Daily  . potassium chloride  40 mEq Oral Once  . sodium chloride flush  3 mL Intravenous Q12H   Continuous  Infusions: . dextrose 5 % and 0.9% NaCl 75 mL/hr at 03/11/20 0645     LOS: 1 day    Time spent: 35 minutes    Irine Seal, MD [facility]   To contact the attending provider between 7A-7P or the covering provider during after hours 7P-7A, please log into the web site [email or web address] and access using universal [place] password for that web site. If you do not have the password, please call the hospital operator.  03/11/2020, 12:57 PM

## 2020-03-11 NOTE — Consult Note (Signed)
WOC Nurse Consult Note: Reason for Consult:evaluate wound VAC dressing changes Patient poor historian. She has a NPWT dressing to a wound on the RLE. She is not able to tell me about the etiology.  She is not able to tell me who follows her as an outpatient for her wound care.  Wound type: full thickness wound RLE; medial pretibial region Pressure Injury POA: NA Measurement:5cm x 3cm x 1cm  Wound bed:100% clean, early granulation tissue, moist Drainage (amount, consistency, odor) minimal in VAC canister Periwound: intact Dressing procedure/placement/frequency: Removed old NPWT dressing Filled wound with  _1__ piece of black foam Sealed NPWT dressing at HG Patient tolerated procedure well  Ok for bedside nurse to change non-complex NPWT dressing M/W/F  T/Th/Sat  Orders updated. WOC nurse will not follow patient Queena Monrreal [facility], CNS, CWON-AP [phone]

## 2020-03-11 NOTE — Care Management (Signed)
Per Zack with [employer], DME will be delivered to patient's home by 2 pm today.   Ronny Flurry RN

## 2020-03-12 DIAGNOSIS — R400 Somnolence: Secondary | ICD-10-CM

## 2020-03-12 DIAGNOSIS — R439 Unspecified disturbances of smell and taste: Secondary | ICD-10-CM

## 2020-03-12 DIAGNOSIS — E43 Unspecified severe protein-calorie malnutrition: Secondary | ICD-10-CM

## 2020-03-12 DIAGNOSIS — Z515 Encounter for palliative care: Secondary | ICD-10-CM

## 2020-03-12 DIAGNOSIS — L8990 Pressure ulcer of unspecified site, unspecified stage: Secondary | ICD-10-CM | POA: Insufficient documentation

## 2020-03-12 LAB — CBC WITH DIFFERENTIAL/PLATELET
Abs Immature Granulocytes: 1.55 10*3/uL — ABNORMAL HIGH (ref 0.00–0.07)
Basophils Absolute: 0.1 10*3/uL (ref 0.0–0.1)
Basophils Relative: 0 %
Eosinophils Absolute: 0.1 10*3/uL (ref 0.0–0.5)
Eosinophils Relative: 1 %
HCT: 26 % — ABNORMAL LOW (ref 36.0–46.0)
Hemoglobin: 8.2 g/dL — ABNORMAL LOW (ref 12.0–15.0)
Immature Granulocytes: 8 %
Lymphocytes Relative: 5 %
Lymphs Abs: 1.1 10*3/uL (ref 0.7–4.0)
MCH: 27.2 pg (ref 26.0–34.0)
MCHC: 31.5 g/dL (ref 30.0–36.0)
MCV: 86.1 fL (ref 80.0–100.0)
Monocytes Absolute: 0.9 10*3/uL (ref 0.1–1.0)
Monocytes Relative: 5 %
Neutro Abs: 16.3 10*3/uL — ABNORMAL HIGH (ref 1.7–7.7)
Neutrophils Relative %: 81 %
Platelets: 244 10*3/uL (ref 150–400)
RBC: 3.02 MIL/uL — ABNORMAL LOW (ref 3.87–5.11)
RDW: 16.9 % — ABNORMAL HIGH (ref 11.5–15.5)
WBC: 20.1 10*3/uL — ABNORMAL HIGH (ref 4.0–10.5)
nRBC: 0.1 % (ref 0.0–0.2)

## 2020-03-12 LAB — BASIC METABOLIC PANEL
Anion gap: 9 (ref 5–15)
BUN: 22 mg/dL (ref 8–23)
CO2: 20 mmol/L — ABNORMAL LOW (ref 22–32)
Calcium: 8 mg/dL — ABNORMAL LOW (ref 8.9–10.3)
Chloride: 111 mmol/L (ref 98–111)
Creatinine, Ser: 0.92 mg/dL (ref 0.44–1.00)
GFR calc Af Amer: >60 mL/min (ref >60)
GFR calc non Af Amer: >60 mL/min (ref >60)
Glucose, Bld: 173 mg/dL — ABNORMAL HIGH (ref 70–99)
Potassium: 4.3 mmol/L (ref 3.5–5.1)
Sodium: 140 mmol/L (ref 135–145)

## 2020-03-12 LAB — GLUCOSE, CAPILLARY
Glucose-Capillary: 212 mg/dL — ABNORMAL HIGH (ref 70–99)
Glucose-Capillary: 222 mg/dL — ABNORMAL HIGH (ref 70–99)
Glucose-Capillary: 198 mg/dL — ABNORMAL HIGH (ref 70–99)
Glucose-Capillary: 184 mg/dL — ABNORMAL HIGH (ref 70–99)

## 2020-03-12 NOTE — Progress Notes (Signed)
Patient allowed staff to get her vitals and she was correct she was not soiled.  Staff got her up to the [facility] and she voided.

## 2020-03-12 NOTE — Progress Notes (Signed)
PT Cancellation Note  Patient Details Name: Bianca [name] MRN: [id number] DOB: 01-Feb-1946   Cancelled Treatment:    Reason Eval/Treat Not Completed: Fatigue/lethargy limiting ability to participate;Other (comment).  Pt is lethargic and declines PT, asked to return at a later time to re-attempt.  Pt has agreed to try later.   Ivar Drape 03/12/2020, 10:28 AM   Samul Dada, PT MS Acute Rehab Dept. Number: [facility] [id number] and [facility] [phone]

## 2020-03-12 NOTE — Progress Notes (Addendum)
PROGRESS NOTE  Bianca [name] IDP:[id number] DOB: 12-22-1945 DOA: 03/09/2020 PCP: Bianca Cutter, PA-C  Brief History   Bianca Martin Patrickis a 74 y.o.femalewith medical history significant ofCAD s/p CABG; HTN; and DM presenting with lethargy. She had CABG 1 month ago and was subsequently hospitalized from 5/17-25 for wound dehiscence and leg swelling. She was recently discharged. Yesterday, she got up and and had a sponge bath. She went to the living room to eat and she vomited. They gave her dramamine, Pepto and a protein shak. About 6pm, she was constipated and and needed help with manual disimpaction. She had not voided all day and had small BM. She was weak but able to stand. She tried to eat and vomited again. They went to bed about 10pm and it took 2 hours to get there and she wasn't able to stand and get there. Finally, they decided to call 911. She last ate well Friday AM. She had been doing well when she left the hospital and this was an acute change, other than constipation. She gave her Miralax, pedialyte, and water with some passage of stool.   ED Course:Hypovolemia, decreased PO intake, emesis. Has CABG in March and recent hospitalization for wound dehiscence from harvest site. Wound vac in place and wound looks ok. Hypotension, SBP in 80s. Decreased appetite, not eating well, some emesis, mild abdominal discomfort. CT abdomen unremarkable. BP is fluid responsive.  The patient has been admitted to a telemetry bed. She is receiving IV fluids. She has had BM x 4 since admission.   Consultants  . Wound Care  Procedures  . None  Antibiotics   Anti-infectives (From admission, onward)   None    .  Subjective  The patient is resting comfortably. No new complaints.  Objective   Vitals:  Vitals:   03/12/20 1434 03/12/20 1434  BP: 105/60 (!) 107/55  Pulse: 95 96  Resp: 17   Temp: 97.8 F (36.6 C)   SpO2: 99% 98%   Exam:  Constitutional:   . The patient is awake, alert, and oriented x 3. No acute distress. Respiratory:  . No increased work of breathing. . No wheezes, rales, or rhonchi . No tactile fremitus Cardiovascular:  . Regular rate and rhythm . No murmurs, ectopy, or gallups. . No lateral PMI. No thrills. Abdomen:  . Abdomen is soft, non-tender, non-distended . No hernias, masses, or organomegaly . Normoactive bowel sounds.  Musculoskeletal:  . No cyanosis, clubbing, or edema Skin:  . No rashes, lesions, ulcers . palpation of skin: no induration or nodules Neurologic:  . CN 2-12 intact . Sensation all 4 extremities intact Psychiatric:  . Mental status o Mood, affect appropriate o Orientation to person, place, time  . judgment and insight appear intact  I have personally reviewed the following:   Today's Data  . Vitals, Glucoses  Micro Data  . Blood cultures x 2: No growth  Imaging  . CT abdomen and pelvis (03/09/2020)  Scheduled Meds: . acetaminophen  650 mg Oral Q6H  . aspirin  325 mg Oral Daily  . atorvastatin  80 mg Oral Daily  . bisacodyl  10 mg Rectal Daily  . Chlorhexidine Gluconate Cloth  6 each Topical Daily  . docusate sodium  100 mg Oral BID  . enoxaparin (LOVENOX) injection  40 mg Subcutaneous Daily  . insulin aspart  0-5 Units Subcutaneous QHS  . insulin aspart  0-9 Units Subcutaneous TID WC  . insulin glargine  10 Units Subcutaneous Daily  .  loratadine  10 mg Oral Daily  . polyethylene glycol  17 g Oral Daily  . sodium chloride flush  3 mL Intravenous Q12H   Continuous Infusions: . dextrose 5 % and 0.9% NaCl 75 mL/hr at 03/12/20 1008    Principal Problem:   Hypotension due to hypovolemia Active Problems:   Type 2 diabetes mellitus (HCC)   Essential hypertension   Hyperlipidemia   CAD (coronary artery disease)   Wound dehiscence   Constipation   AKI (acute kidney injury) (HCC)   Leukocytosis   LOS: 2 days   A & P  Hypotension: Likely secondary to hypovolemia  secondary to poor oral intake.  Patient with recent prior hospitalization associated with ACS and then harvest wound dehiscence.  Patient noted to have developed constipation which was felt likely causing her to limit her oral intake leading to dehydration and generalized weakness.  Patient noted to have been refusing to take her medications.  Blood pressure improving with hydration.  Infectious work-up negative to date.  Urinalysis unremarkable.  Chest x-ray negative.  Blood cultures ordered however patient refused and blood cultures reordered today.  Per daughter patient now agreeing for oral intake.  Patient noted to be eating approximately 25% of her meals.  Continue IV fluids.  Supportive care.  Acute kidney injury: Secondary to prerenal azotemia in the setting of poor oral intake, NSAID use, diuretics, ARB.  Patient noted to be hypotensive on admission.  Hypotension responding to IV fluids. Urinalysis nitrite negative, leukocytes negative, 100 protein.  Renal function improving with hydration.  Continue IV fluids.  Supportive care.   Severe Protein Calorie Malnutrition: Poor PO intake of fluids and nutrition here. Nutrition consult, supplements, and calorie counts are in place. IV fluids discontinued. Will monitor BMP to evaluate if patient is able to maintain her fluid requirements on her own prior to discharge.  Hypertension: Continue to hold antihypertensive medications secondary to #1.    Constipation: Patient having bowel movement on regimen of MiraLAX daily and Colace.  CT abdomen and pelvis negative for any acute abnormalities.  Continue current bowel regimen. The patient has now had 2 BM's since admission.  Dehydration: IV fluids.  Diabetes mellitus type 2: Hemoglobin A1c 7.1 (02/25/2020).  CBG of 104 this morning.  Patient with poor oral intake.  Change IV fluids back to normal saline as CBGs this afternoon in the 200s.  Continue heart healthy diet.  Supportive care.  Failure to  thrive: Patient refusing oral intake.  Patient with poor oral intake at present with hypotension felt secondary to poor p.o. intake.  Patient states no appetite.  CT abdomen and pelvis which was done negative for any acute abnormalities.  Patient with a leukocytosis however afebrile, chest x-ray negative for any acute infiltrates.  Urinalysis is unremarkable.  Blood cultures ordered but patient refused.  Continue IV fluids with normal saline.  Per daughter patient now agreeing to eat and about 25% of her meals.  Continue hydration with IV fluids for another 24 hours.  Follow.  Hypokalemia: Magnesium level at 2.  Potassium at 3.4.  K. Dur 40 mEq p.o. x1.  Follow.  Iron deficiency anemia/anemia of chronic disease: Patient with anemia with hemoglobin of 7.4.  Patient with weakness and hypotension.  Anemia panel with a a iron level of 16, TIBC of 196.  Follow H&H.  Patient received a dose of IV Feraheme.  Transfusion threshold hemoglobin < 7.   Hyperlipidemia: Continue statin.  Coronary artery disease with recent CABG: Continue aspirin.  ARB, diuretics on hold due to hypotension.  Outpatient follow-up with cardiology.  Recent wound dehiscence: Wound VAC in place.  Patient noted to have ongoing pain.  Patient noted to be taking Tylenol 650 mg every 6 hours standing dose.  Advil discontinued due to acute kidney injury.  Patient noted to have declined other pain medications at this time.  Due to limited mobility PT OT ordered and recommended SNF however patient noted to be refusing SNF and wanted to go home with home health.  Home health therapies have been ordered.  Patient has been seen by the wound care nurse.  Recommendations made.   Leukocytosis: Likely reactive leukocytosis.  Patient afebrile.  Urinalysis unremarkable.  Chest x-ray negative for any acute infiltrates.  Patient with no respiratory symptoms.  Blood cultures ordered however patient refused yesterday.  Blood cultures were reordered today.   No need for antibiotics at this time.  Follow.  I have seen and examined this patient myself. I have spent 30 minutes in her evaluation and care.  DVT prophylaxis: Lovenox Code Status: Full Family Communication: Updated daughter at bedside. Disposition:   Status is: Observation  Dispo: The patient is from: Home  Anticipated d/c is to: Home with home health therapies.  Patient currently with poor oral intake, has been refusing oral intake however started with some oral intake and taking her medications.  Patient presented with constipation, on IV fluids for hypotension. Nutrition has been consulted. Will also consult palliative care.   Adaly Puder, DO [facility] Direct contact: see [email or web address]  7PM-7AM contact night coverage as above 03/13/2020, 4:20 PM  LOS: 2 days

## 2020-03-12 NOTE — Progress Notes (Signed)
Inpatient Diabetes Program Recommendations  AACE/ADA: New Consensus Statement on Inpatient Glycemic Control (2015)  Target Ranges:  Prepandial:   less than 140 mg/dL      Peak postprandial:   less than 180 mg/dL (1-2 hours)      Critically ill patients:  140 - 180 mg/dL   Results for SHAARON, GOLLIDAY (MRN [id number]) as of 03/12/2020 13:24  Ref. Range 03/12/2020 07:52 03/12/2020 11:57  Glucose-Capillary Latest Ref Range: 70 - 99 mg/dL 174 (H)  3 units NOVOLOG  222 (H)  3 units NOVOLOG +  10 units LANTUS    Admit with: Hypotension/AKI due to hypovolemia  History: DM  Home DM Meds: Lantus 10 units Daily  Current Orders: Lantus 10 units Daily      Novolog Sensitive Correction Scale/ SSI (0-9 units) TID AC + HS    Patient had CABG 1 month ago and was subsequently hospitalized from 5/17-25 for wound dehiscence and leg swelling.     MD- CBG 212 this AM.  Please consider increasing Lantus slightly to 12 units Daily  If patient continues to have Poor PO intake, may want to increase frequency of Novolog SSI to Q4 hours     --Will follow patient during hospitalization--  Ambrose Finland RN, MSN, CDE Diabetes Coordinator Inpatient Glycemic Control Team Team Pager: [phone] (8a-5p)

## 2020-03-12 NOTE — Progress Notes (Signed)
Patient refused to have [place] get her vitals and it is obvious that she urinated in bed (you can smell the urine) but she is refusing staff to get her clean at this time. We will try again later.  In addition, she refused her scheduled Tylenol and scheduled stool softener.  She stated "I don't want anyone taking my blood pressure and no I don't want to get cleaned up, I am not wet".

## 2020-03-13 DIAGNOSIS — E43 Unspecified severe protein-calorie malnutrition: Secondary | ICD-10-CM | POA: Insufficient documentation

## 2020-03-13 LAB — GLUCOSE, CAPILLARY
Glucose-Capillary: 183 mg/dL — ABNORMAL HIGH (ref 70–99)
Glucose-Capillary: 279 mg/dL — ABNORMAL HIGH (ref 70–99)
Glucose-Capillary: 161 mg/dL — ABNORMAL HIGH (ref 70–99)
Glucose-Capillary: 122 mg/dL — ABNORMAL HIGH (ref 70–99)

## 2020-03-13 MED ORDER — IBUPROFEN 400 MG PO TABS
400.0000 mg | ORAL_TABLET | Freq: Four times a day (QID) | ORAL | Status: DC | PRN
  Administered 2020-03-13 – 2020-03-14 (×3): 400 mg via ORAL
  Filled 2020-03-13 (×4): qty 1

## 2020-03-13 MED ORDER — GLUCERNA SHAKE PO LIQD
237.0000 mL | Freq: Two times a day (BID) | ORAL | Status: DC
  Administered 2020-03-13 – 2020-03-23 (×15): 237 mL via ORAL

## 2020-03-13 MED ORDER — ENSURE MAX PROTEIN PO LIQD
11.0000 [oz_av] | Freq: Every day | ORAL | Status: DC
  Administered 2020-03-13 – 2020-03-21 (×6): 11 [oz_av] via ORAL
  Filled 2020-03-13 (×14): qty 330

## 2020-03-13 MED ORDER — ADULT MULTIVITAMIN W/MINERALS CH
1.0000 | ORAL_TABLET | Freq: Every day | ORAL | Status: DC
  Administered 2020-03-13 – 2020-03-23 (×11): 1 via ORAL
  Filled 2020-03-13 (×12): qty 1

## 2020-03-13 NOTE — Progress Notes (Signed)
OT Cancellation Note  Patient Details Name: Bianca [name] MRN: [id number] DOB: [birth year]   Cancelled Treatment:    Reason Eval/Treat Not Completed: Pain limiting ability to participate;Other (comment) RN reports pt in a lot of pain currently requesting OTA return at a later time. Will check back as time allows for OT session.   Audery Amel., COTA/L Acute Rehabilitation Services [phone]   Angelina Pih 03/13/2020, 9:12 AM

## 2020-03-13 NOTE — Progress Notes (Signed)
Physical Therapy Treatment Patient Details Name: Bianca [name] MRN: [id number] DOB: May 15, 1946 Today's Date: 03/13/2020    History of Present Illness 74 y.o. female with medical history significant of CAD s/p CABG; HTN; and DM presenting with lethargy.  She had CABG 1 month ago and was subsequently hospitalized from 5/17-25 for wound dehiscence and leg swelling. Pt with recent history of vomiting, limited P.O. intake, and weakness.    PT Comments    Pt supine in bed.  Pt required max encouragement to participate in therapy this am.  Pt perform stand pivot and repeated stand in front of recliner seated.  She continues to benefit from SNF placement as she continues to require max assistance.     Follow Up Recommendations  SNF     Equipment Recommendations  Hospital bed;Other (comment);Wheelchair (measurements PT);Wheelchair cushion (measurements PT)    Recommendations for Other Services       Precautions / Restrictions Precautions Precautions: Fall;Sternal Precaution Booklet Issued: No Precaution Comments: RLE wound vac, sternal cues Restrictions Weight Bearing Restrictions: No RUE Partial Weight Bearing Percentage or Pounds: Sternal precautions    Mobility  Bed Mobility Overal bed mobility: Needs Assistance Bed Mobility: Supine to Sit;Sit to Supine     Supine to sit: Max assist;+2 for physical assistance     General bed mobility comments: Pt required assistance to move from supine to sit.  Assistance to move BLEs and elevate trunk into sitting.  Pt required assistance to move hips to edge of bed with the bed pad.  Transfers Overall transfer level: Needs assistance Equipment used: (FACE to [facility] transfer) Transfers: Sit to/from [employer] to Stand: Max assist Stand pivot transfers: Max assist       General transfer comment: Pt performed stand and pivot with face to face assistance.  Presents with bowel incontinence and required repeated sit to  stand for pericare.  Ambulation/Gait Ambulation/Gait assistance: (NT)               Stairs             Wheelchair Mobility    Modified Rankin (Stroke Patients Only)       Balance Overall balance assessment: Needs assistance   Sitting balance-Leahy Scale: Poor       Standing balance-Leahy Scale: Poor                              Cognition Arousal/Alertness: Awake/alert Behavior During Therapy: Flat affect Overall Cognitive Status: Impaired/Different from baseline Area of Impairment: Awareness;Safety/judgement;Following commands                 Orientation Level: Time Current Attention Level: Selective Memory: Decreased short-term memory Following Commands: Follows one step commands inconsistently;Follows one step commands with increased time Safety/Judgement: Decreased awareness of deficits Awareness: Intellectual Problem Solving: Slow processing;Requires verbal cues General Comments: pt was fairly non verbal wtih her daughter intervening with her      Exercises      General Comments        Pertinent Vitals/Pain Pain Assessment: Faces Pain Score: 8  Pain Location: generalized, RLE Pain Descriptors / Indicators: Guarding;Grimacing;Operative site guarding Pain Intervention(s): Monitored during session;Repositioned    Home Living                      Prior Function            PT Goals (current goals can now be found  in the care plan section) Acute Rehab PT Goals Patient Stated Goal: none reported Potential to Achieve Goals: Fair Additional Goals Additional Goal #1: pt will maintain dynamic standing balance within 10 inches of her base of support with unilateral UE support of the LRAD Progress towards PT goals: Progressing toward goals    Frequency    Min 3X/week      PT Plan Current plan remains appropriate    Co-evaluation              AM-PAC PT "6 Clicks" Mobility   Outcome Measure  Help  needed turning from your back to your side while in a flat bed without using bedrails?: A Lot Help needed moving from lying on your back to sitting on the side of a flat bed without using bedrails?: Total Help needed moving to and from a bed to a chair (including a wheelchair)?: Total Help needed standing up from a chair using your arms (e.g., wheelchair or bedside chair)?: Total Help needed to walk in hospital room?: Total Help needed climbing 3-5 steps with a railing? : Total 6 Click Score: 7    End of Session Equipment Utilized During Treatment: Gait belt Activity Tolerance: Patient limited by fatigue;Patient limited by pain Patient left: in bed;with call bell/phone within reach;with bed alarm set;with family/visitor present;with nursing/sitter in room Nurse Communication: Mobility status PT Visit Diagnosis: Muscle weakness (generalized) (M62.81);Unsteadiness on feet (R26.81)     Time: 5397-6734 PT Time Calculation (min) (ACUTE ONLY): 20 min  Charges:  $Therapeutic Activity: 8-22 mins                     Erasmo Leventhal , PTA Acute Rehabilitation Services Pager [phone] Office [phone]     Jaree Dwight Eli Hose 03/13/2020, 6:15 PM

## 2020-03-13 NOTE — Progress Notes (Signed)
Initial Nutrition Assessment  DOCUMENTATION CODES:   Severe malnutrition in context of acute illness/injury  INTERVENTION:   -Initiate 48 hour calorie count per MD request -MVI with minerals daily -Glucerna Shake po BID, each supplement provides 220 kcal and 10 grams of protein -Ensure Max po daily, each supplement provides 150 kcal and 30 grams of protein -Magic cup TID with meals, each supplement provides 290 kcal and 9 grams of protein  NUTRITION DIAGNOSIS:   Severe Malnutrition related to acute illness(CAD s/p CABG) as evidenced by energy intake < or equal to 75% for > or equal to 1 month, mild fat depletion, moderate fat depletion, moderate muscle depletion, mild muscle depletion, percent weight loss.  GOAL:   Patient will meet greater than or equal to 90% of their needs  MONITOR:   PO intake, Supplement acceptance, Diet advancement, Labs, Weight trends, Skin, I & O's  REASON FOR ASSESSMENT:   Consult Assessment of nutrition requirement/status  ASSESSMENT:   Bianca [name] is a 74 y.o. female with medical history significant of CAD s/p CABG; HTN; and DM presenting with lethargy.  She had CABG 1 month ago and was subsequently hospitalized from 5/17-25 for wound dehiscence and leg swelling.  Pt admitted with hypotension/ AKI due to hypovolemia.   Reviewed I/O's: +2.3 L x 24 hours and +6.2 L since admission  Drain output: 0 ml x 24 hours  Spoke with pt at bedside, who was very fatigued and deferred interview to her daughter Amado Coe) at bedside. Pt daughter reports that pt has experienced a general decline in health over the past 4-6 weeks after CABG. At baseline, pt with very good appetite and consumed 3 very hearty meals and snacks daily.   Intake has progressively decreased over the past month. Per daughter, pt has had many setbacks including another hospitalization for wound dehiscence requiring wound vac, diarrhea secondary to metformin use, and constipation due to  iron supplementation/ poor oral intake/ decreased fluid intake. Intake has severely declined over the past week secondary to constipation (pt daughter reports that she was manually disimpacted at MD visit last Friday). Pt has taken in very minimal foods and liquids since then- pt has consumed about 5 cups of cranberry juice today without much encouragement, which is a vast improvement for her. Intake of food remains minimal; pt consumed only a few bites of breakfast and had only been consuming about 20% of meals over the past few days.   Pt daughter has been keeping detailed weight records over the past few weeks. She estimates her dry weight is about 150#; pt daughter suspects she has lost weight over the past 1-2 weeks due to inadequate oral intake. Reviewed wt hx; pt has experienced a 19.9% wt loss over the past month, which is significant for time frame. Per daughter's weight records, pt was 151.2# on 03/05/20- this would quantify as a 6.9% wt loss over the past week, which is also significant for time frame.   Discussed importance of good meal and supplement intake to promote healing. Pt daughter agrees that pt is consuming liquids better than solid foods at this time. Multiple supplements have been trialed in the past- pt prefers Glucerna and Premier Protein. Also amenable to [employer].   6/1 Breakfast: 93 kcals, 1 gram protein Lunch: 65 kcals, 5 grams protein  Total intake: 158 kcal (8% of minimum estimated needs)  6 grams protein (5% of minimum estimated needs)  6/2 Breakfast: 111 kcals, 1 gram protein Dinner: 132 kcals, 8  grams protein  Total intake: 243 kcal (12% of minimum estimated needs)  9 grams protein (8% of minimum estimated needs)  Average Total intake: 201 kcal (10% of minimum estimated needs)  8 grams protein (7% of minimum estimated needs)  Medications reviewed and include dextrose 5%-0.9% sodium chloride infusion @ 75 ml/hr.    Lab Results  Component Value Date    HGBA1C 7.1 (H) 02/25/2020   PTA DM medications are 10 units insulin glargine daily. Per discussion with daughter, pt was not taking PTA due to prior authorization required from insurance.   Labs reviewed: CBGS: [id number] (inpatient orders for glycemic control are 0-5 units insulin aspart q HS, 0-9 units insulin aspart TID with meals, and 10 units insulin glargine daily).   NUTRITION - FOCUSED PHYSICAL EXAM:    Most Recent Value  Orbital Region  Moderate depletion  Upper Arm Region  Moderate depletion  Thoracic and Lumbar Region  Mild depletion  Buccal Region  Mild depletion  Temple Region  Moderate depletion  Clavicle Bone Region  Moderate depletion  Clavicle and Acromion Bone Region  Moderate depletion  Scapular Bone Region  Moderate depletion  Dorsal Hand  Mild depletion  Patellar Region  No depletion  Anterior Thigh Region  No depletion  Posterior Calf Region  No depletion  Edema (RD Assessment)  Mild  Hair  Reviewed  Eyes  Reviewed  Mouth  Reviewed  Skin  Reviewed  Nails  Reviewed       Diet Order:   Diet Order            Diet Heart Room service appropriate? Yes; Fluid consistency: Thin  Diet effective now              EDUCATION NEEDS:   Education needs have been addressed  Skin:  Skin Assessment: Skin Integrity Issues: Skin Integrity Issues:: Incisions, Stage II, Wound VAC Stage II: coccyx Wound Vac: rt pretibial Incisions: chest  Last BM:  03/12/20  Height:   Ht Readings from Last 1 Encounters:  03/09/20 5\' 6"  (1.676 m)    Weight:   Wt Readings from Last 1 Encounters:  03/09/20 64 kg    Ideal Body Weight:  59.1 kg  BMI:  Body mass index is 22.77 kg/m.  Estimated Nutritional Needs:   Kcal:  2000-2200  Protein:  110-125 grams  Fluid:  > 2 L    Loistine Chance, RD, LDN, [place] Registered Dietitian II Certified Diabetes Care and Education Specialist Please refer to [facility] for RD and/or RD on-call/weekend/after hours pager

## 2020-03-13 NOTE — Plan of Care (Signed)
  Problem: Education: Goal: Knowledge of General Education information will improve Description Including pain rating scale, medication(s)/side effects and non-pharmacologic comfort measures Outcome: Progressing   

## 2020-03-13 NOTE — Progress Notes (Signed)
Pt refused all VS at this time stating she just wanted to be left alone. Will attempt again later in shift. Pt safe in bed and resting comfortably prior to RN entering room.

## 2020-03-14 DIAGNOSIS — F509 Eating disorder, unspecified: Secondary | ICD-10-CM

## 2020-03-14 DIAGNOSIS — Z7189 Other specified counseling: Secondary | ICD-10-CM

## 2020-03-14 DIAGNOSIS — R627 Adult failure to thrive: Secondary | ICD-10-CM | POA: Diagnosis present

## 2020-03-14 LAB — CBC WITH DIFFERENTIAL/PLATELET
Abs Immature Granulocytes: 0.9 10*3/uL — ABNORMAL HIGH (ref 0.00–0.07)
Basophils Absolute: 0 10*3/uL (ref 0.0–0.1)
Basophils Relative: 0 %
Eosinophils Absolute: 0.1 10*3/uL (ref 0.0–0.5)
Eosinophils Relative: 1 %
HCT: 24 % — ABNORMAL LOW (ref 36.0–46.0)
Hemoglobin: 7.3 g/dL — ABNORMAL LOW (ref 12.0–15.0)
Immature Granulocytes: 4 %
Lymphocytes Relative: 6 %
Lymphs Abs: 1.2 10*3/uL (ref 0.7–4.0)
MCH: 26.8 pg (ref 26.0–34.0)
MCHC: 30.4 g/dL (ref 30.0–36.0)
MCV: 88.2 fL (ref 80.0–100.0)
Monocytes Absolute: 1 10*3/uL (ref 0.1–1.0)
Monocytes Relative: 5 %
Neutro Abs: 17.1 10*3/uL — ABNORMAL HIGH (ref 1.7–7.7)
Neutrophils Relative %: 84 %
Platelets: 280 10*3/uL (ref 150–400)
RBC: 2.72 MIL/uL — ABNORMAL LOW (ref 3.87–5.11)
RDW: 17.2 % — ABNORMAL HIGH (ref 11.5–15.5)
WBC: 20.4 10*3/uL — ABNORMAL HIGH (ref 4.0–10.5)
nRBC: 0.1 % (ref 0.0–0.2)

## 2020-03-14 LAB — GLUCOSE, CAPILLARY
Glucose-Capillary: 107 mg/dL — ABNORMAL HIGH (ref 70–99)
Glucose-Capillary: 144 mg/dL — ABNORMAL HIGH (ref 70–99)
Glucose-Capillary: 141 mg/dL — ABNORMAL HIGH (ref 70–99)
Glucose-Capillary: 149 mg/dL — ABNORMAL HIGH (ref 70–99)

## 2020-03-14 MED ORDER — SERTRALINE HCL 50 MG PO TABS
25.0000 mg | ORAL_TABLET | Freq: Every day | ORAL | Status: DC
  Administered 2020-03-15 – 2020-03-21 (×7): 25 mg via ORAL
  Filled 2020-03-14 (×8): qty 1

## 2020-03-14 MED ORDER — DRONABINOL 2.5 MG PO CAPS
2.5000 mg | ORAL_CAPSULE | Freq: Two times a day (BID) | ORAL | Status: DC
  Administered 2020-03-15 – 2020-03-22 (×14): 2.5 mg via ORAL
  Filled 2020-03-14 (×15): qty 1

## 2020-03-14 MED ORDER — BACITRACIN-NEOMYCIN-POLYMYXIN OINTMENT TUBE
TOPICAL_OINTMENT | Freq: Every day | CUTANEOUS | Status: DC
  Filled 2020-03-14: qty 14

## 2020-03-14 NOTE — Progress Notes (Signed)
PROGRESS NOTE  Bianca [name] NFA:[id number] DOB: Jun 01, 1946 DOA: 03/09/2020 PCP: Julian Hy, PA-C  Brief History   Bianca [name] a 74 y.o.femalewith medical history significant ofCAD s/p CABG; HTN; and DM presenting with lethargy. She had CABG 1 month ago and was subsequently hospitalized from 5/17-25 for wound dehiscence and leg swelling. She was recently discharged. Yesterday, she got up and and had a sponge bath. She went to the living room to eat and she vomited. They gave her dramamine, Pepto and a protein shak. About 6pm, she was constipated and and needed help with manual disimpaction. She had not voided all day and had small BM. She was weak but able to stand. She tried to eat and vomited again. They went to bed about 10pm and it took 2 hours to get there and she wasn't able to stand and get there. Finally, they decided to call 911. She last ate well Friday AM. She had been doing well when she left the hospital and this was an acute change, other than constipation. She gave her Miralax, pedialyte, and water with some passage of stool.   ED Course:Hypovolemia, decreased PO intake, emesis. Has CABG in March and recent hospitalization for wound dehiscence from harvest site. Wound vac in place and wound looks ok. Hypotension, SBP in 80s. Decreased appetite, not eating well, some emesis, mild abdominal discomfort. CT abdomen unremarkable. BP is fluid responsive.  The patient has been admitted to a telemetry bed. She is receiving IV fluids. She has had BM x 4 since admission. She continues to have poor PO intake of liquids and nutrition. Nutrition has been consulted and calorie count is underway. IV fluids have been discontinued so we may monitor her hydration status better.  Consultants   Wound Care  Procedures   None  Antibiotics   Anti-infectives (From admission, onward)   None     Subjective  The patient is resting comfortably. No new  complaints.  Objective   Vitals:  Vitals:   03/14/20 1338 03/14/20 1402  BP: 125/60 123/62  Pulse: (!) 109 (!) 108  Resp: 18 18  Temp: 98.3 F (36.8 C)   SpO2: 99% 99%   Exam:  Constitutional:   The patient is awake, alert, and oriented x 3. No acute distress. Respiratory:   No increased work of breathing.  No wheezes, rales, or rhonchi  No tactile fremitus Cardiovascular:   Regular rate and rhythm  No murmurs, ectopy, or gallups.  No lateral PMI. No thrills. Abdomen:   Abdomen is soft, non-tender, non-distended  No hernias, masses, or organomegaly  Normoactive bowel sounds.  Musculoskeletal:   No cyanosis, clubbing, or edema Skin:   No rashes, lesions, ulcers  palpation of skin: no induration or nodules Neurologic:   CN 2-12 intact  Sensation all 4 extremities intact Psychiatric:   Mental status o Mood, affect appropriate o Orientation to person, place, time   judgment and insight appear intact  I have personally reviewed the following:   Today's Data   Vitals, Glucoses  Micro Data   Blood cultures x 2: No growth  Imaging   CT abdomen and pelvis (03/09/2020)  Scheduled Meds:  acetaminophen  650 mg Oral Q6H   aspirin  325 mg Oral Daily   atorvastatin  80 mg Oral Daily   bisacodyl  10 mg Rectal Daily   Chlorhexidine Gluconate Cloth  6 each Topical Daily   docusate sodium  100 mg Oral BID   enoxaparin (LOVENOX) injection  40  mg Subcutaneous Daily   feeding supplement (GLUCERNA SHAKE)  237 mL Oral BID BM   insulin aspart  0-5 Units Subcutaneous QHS   insulin aspart  0-9 Units Subcutaneous TID WC   insulin glargine  10 Units Subcutaneous Daily   loratadine  10 mg Oral Daily   multivitamin with minerals  1 tablet Oral Daily   polyethylene glycol  17 g Oral Daily   Ensure Max Protein  11 oz Oral Daily   sodium chloride flush  3 mL Intravenous Q12H   Continuous Infusions:   Principal Problem:   Hypotension due to  hypovolemia Active Problems:   Type 2 diabetes mellitus (HCC)   Essential hypertension   Hyperlipidemia   CAD (coronary artery disease)   Wound dehiscence   Constipation   AKI (acute kidney injury) (HCC)   Leukocytosis   Pressure injury of skin   Protein-calorie malnutrition, severe   LOS: 4 days   A & P  Hypotension: Likely secondary to hypovolemia secondary to poor oral intake.  Patient with recent prior hospitalization associated with ACS and then harvest wound dehiscence.  Patient noted to have developed constipation which was felt likely causing her to limit her oral intake leading to dehydration and generalized weakness.  Patient noted to have been refusing to take her medications.  Blood pressure improving with hydration.  Infectious work-up negative to date.  Urinalysis unremarkable.  Chest x-ray negative.  Blood cultures ordered however patient refused and blood cultures reordered today.  Per daughter patient now agreeing for oral intake.  Patient noted to be eating approximately 25% of her meals.  Continue IV fluids.  Supportive care.  Acute kidney injury: Secondary to prerenal azotemia in the setting of poor oral intake, NSAID use, diuretics, ARB.  Patient noted to be hypotensive on admission.  Hypotension responding to IV fluids. Urinalysis nitrite negative, leukocytes negative, 100 protein.  Renal function improving with hydration.  Continue IV fluids.  Supportive care.   Severe Protein Calorie Malnutrition: Poor PO intake of fluids and nutrition here. Nutrition consult, supplements, and calorie counts are in place. IV fluids discontinued. Will monitor BMP to evaluate if patient is able to maintain her fluid requirements on her own prior to discharge.  Hypertension: Continue to hold antihypertensive medications secondary to #1.    Constipation: Patient having bowel movement on regimen of MiraLAX daily and Colace.  CT abdomen and pelvis negative for any acute abnormalities.   Continue current bowel regimen. The patient has now had 2 BM's since admission.  Dehydration: IV fluids.  Diabetes mellitus type 2: Hemoglobin A1c 7.1 (02/25/2020).  CBG of 104 this morning.  Patient with poor oral intake.  Change IV fluids back to normal saline as CBGs this afternoon in the 200s.  Continue heart healthy diet.  Supportive care.  Failure to thrive: Patient refusing oral intake.  Patient with poor oral intake at present with hypotension felt secondary to poor p.o. intake.  Patient states no appetite.  CT abdomen and pelvis which was done negative for any acute abnormalities.  Patient with a leukocytosis however afebrile, chest x-ray negative for any acute infiltrates.  Urinalysis is unremarkable.  Blood cultures ordered but patient refused.  Continue IV fluids with normal saline.  Per daughter patient now agreeing to eat and about 25% of her meals.  Continue hydration with IV fluids for another 24 hours.  Follow.  Hypokalemia: Magnesium level at 2.  Potassium at 3.4.  K. Dur 40 mEq p.o. x1.  Follow.  Iron deficiency anemia/anemia of chronic disease: Patient with anemia with hemoglobin of 7.4.  Patient with weakness and hypotension.  Anemia panel with a a iron level of 16, TIBC of 196.  Follow H&H.  Patient received a dose of IV Feraheme.  Transfusion threshold hemoglobin < 7.   Hyperlipidemia: Continue statin.  Coronary artery disease with recent CABG: Continue aspirin. ARB, diuretics on hold due to hypotension.  Outpatient follow-up with cardiology.  Recent wound dehiscence: Wound VAC in place.  Patient noted to have ongoing pain.  Patient noted to be taking Tylenol 650 mg every 6 hours standing dose.  Advil discontinued due to acute kidney injury.  Patient noted to have declined other pain medications at this time.  Due to limited mobility PT OT ordered and recommended SNF however patient noted to be refusing SNF and wanted to go home with home health.  Home health therapies  have been ordered.  Patient has been seen by the wound care nurse.  Recommendations made.   Leukocytosis: Likely reactive leukocytosis.  Patient afebrile.  Urinalysis unremarkable.  Chest x-ray negative for any acute infiltrates.  Patient with no respiratory symptoms.  Blood cultures ordered however patient refused yesterday.  Blood cultures were reordered today.  No need for antibiotics at this time.  Follow.  I have seen and examined this patient myself. I have spent 32 minutes in her evaluation and care.  DVT prophylaxis: Lovenox Code Status: Full Family Communication: Updated daughter at bedside. Disposition:   Status is: Observation  Dispo: The patient is from: Home  Anticipated d/c is to: Home with home health therapies.  Patient currently with poor oral intake, has been refusing oral intake however started with some oral intake and taking her medications.  Patient presented with constipation, on IV fluids for hypotension. Nutrition has been consulted. Will also consult palliative care.   Marchello Rothgeb, DO [facility] Direct contact: see [email or web address]  7PM-7AM contact night coverage as above 03/13/2020, 3:52 PM  LOS: 2 days

## 2020-03-14 NOTE — Progress Notes (Signed)
Pt has a wound vac on her right lower extremity.  Per order dressing is to be changed M,W,F.  Because dressing was changed yesterday (Thursday), family and patient agreed to have her dressing changed tomorrow (Saturday) instead of today as scheduled, and resume to regular schedule next week.

## 2020-03-14 NOTE — Consult Note (Signed)
Consultation Note Date: 03/14/2020   Patient Name: Bianca [name]  DOB: September 14, 1946  MRN: [id number]  Age / Sex: 74 y.o., female  PCP: Julian Hy, PA-C Referring Physician: Karie Kirks, DO  Reason for Consultation: Establishing goals of care and Hospice Evaluation  HPI/Patient Profile: 74 y.o. female  with past medical history of CAD s/p CABG recently in 2021, DM, constipation and hypertension, who was admitted on 03/09/2020 with lethargy.  She was hospitalized 5/17 - 5/25 for wound dehiscence and leg swelling at the harvest site.  Since her surgery April 30th the patient has failed to thrive.  She has had 5 hospitalizations in 3 months and is no longer eating enough to sustain herself long term.   PMT was consulted for goals of care.  Clinical Assessment and Goals of Care:  I have reviewed medical records including EPIC notes, labs and imaging, received report from Dr. Benny Lennert, examined the patient and met at bedside with her son Bianca [name] to discuss diagnosis prognosis, [place], EOL wishes, disposition and options.  After meeting with the patient and Bianca [name], her daughter called me directly.  I introduced Palliative Medicine as specialized medical care for people living with serious illness. It focuses on providing relief from the symptoms and stress of a serious illness.   We discussed a brief life review of the patient. She has 2 children and 7 grand children.  Prior to her CABG she derived great joy out of spending time with her grand children and taking them places.  She also loved playing the lotto.  She is a spiritual woman with close ties to [employer].  She worked outside the home while raising her children.  She went to college and graduated with a [employer] in social work.  She did several jobs including working at [employer] center, Scientist, water quality at [employer], and working at a SNF.  As far as functional and nutritional  status she is eating 25% of her meals or less.  She is not able to stand or do ADLs.  We discussed her current illness and what it means in the larger context of her on-going co-morbidities.  Natural disease trajectory and expectations at EOL were discussed.  [name] described his mother prior to surgery as walking, driving, grocery shopping, enjoying her grand children.  Now she has changed drastically.  She is somnolent, unable to get up and not eating.  She speaks 1 word answers or simply responds "I don't know".   She often refuses food and medications.  We discussed code status.  I asked the patient if her heart stopped and she stopped breathing would she want to be resuscitated?  She replied, "No".  Her son asked her "Do you know what that means?"  She replied, "Yes, I'd be dead".    We discussed Hospice services in the patient's home.  Specifically what is offered with hospice services at home and that there is no co-pay.  Hospice is comprehensive home health that would ensure the patient is comfortable, well cared  for and provided good wound care.  Family would like to give the patient a few more days to see if she will eat.  We discussed her symptoms.  I suggested that it is not uncommon to develop depression after CABG and we discussed starting and antidepressant as well as an appetite stimulant.  I committed to return tomorrow.  After our visit I placed orders for DNR.  When the RN placed a bracelet on the patient the family questioned it.  I received a call from Ms. Tavares - the patient's daughter.  She was concerned that the patient has not been acting like herself and that the choice for DNR may have come from depression.  We changed the patient's code status back to full code.  We discussed liberalizing her diet in order to encourage her to take in more calories.  Hospice and Palliative Care services outpatient were explained and offered.   Primary Decision Maker:  PATIENT    SUMMARY  OF RECOMMENDATIONS    Code status changed to DNR - by patient herself.  She was changed back to full code as her children (POA) are concerned she is depressed and not thinking as she normally would. Added marinol as an appetite stimulant. Add low dose Zoloft for depression.  If well tolerated for 1 week would titrate up. Antibiotic ointment and dressing to chest wound Patient is eligible for hospice services in the home at time of discharge if she desires.  If she does not choose Hospice I would strongly recommend she be followed by Palliative outpatient. PMT will check in with patient again tomorrow.  Code Status/Advance Care Planning:  Full code   Symptom Management:   Marinol for appetite stimulation  Zoloft for depression   Additional Recommendations (Limitations, Scope, Preferences):  Full Scope Treatment  Psycho-social/Spiritual:   Desire for further Chaplaincy support: Daughter would like for patient's Bishop to come in to help with decisions as he was instrumental in making the decision for CABG.  Prognosis:   Difficult to determine.  If her eating remains poor she likely has less than 6 months.  Discharge Planning: To Be Determined      Primary Diagnoses: Present on Admission:  Hypotension due to hypovolemia  Constipation  CAD (coronary artery disease)  Essential hypertension  Hyperlipidemia  Wound dehiscence  AKI (acute kidney injury) ([place])   I have reviewed the medical record, interviewed the patient and family, and examined the patient. The following aspects are pertinent.  Past Medical History:  Diagnosis Date   CAD (coronary artery disease) 01/31/2020   CABG   Diabetes mellitus without complication ([place])    Dyspnea    Hypertension    Hypertensive urgency 01/06/2020   Social History   Socioeconomic History   Marital status: Divorced    Spouse name: Not on file   Number of children: Not on file   Years of education: Not on  file   Highest education level: Not on file  Occupational History   Occupation: retired  Tobacco Use   Smoking status: Former Smoker    Packs/day: 0.50    Years: 56.00    Pack years: 28.00    Types: Cigarettes    Quit date: 12/31/2019    Years since quitting: 0.2   Smokeless tobacco: Never Used  Substance and Sexual Activity   Alcohol use: No   Drug use: No   Sexual activity: Not on file  Other Topics Concern   Not on file  Social History  Narrative   Not on file   Social Determinants of Health   Financial Resource Strain:    Difficulty of Paying Living Expenses:   Food Insecurity:    Worried About Charity fundraiser in the Last Year:    Arboriculturist in the Last Year:   Transportation Needs:    Film/video editor (Medical):    Lack of Transportation (Non-Medical):   Physical Activity:    Days of Exercise per Week:    Minutes of Exercise per Session:   Stress:    Feeling of Stress :   Social Connections:    Frequency of Communication with Friends and Family:    Frequency of Social Gatherings with Friends and Family:    Attends Religious Services:    Active Member of Clubs or Organizations:    Attends Music therapist:    Marital Status:    Family History  Problem Relation Age of Onset   Diabetes Mother    Diabetes Sister    Diabetes Brother    Heart disease Brother     Allergies  Allergen Reactions   Tramadol Nausea And Vomiting      Vital Signs: BP 123/62 (BP Location: Left Arm)    Pulse (!) 108    Temp 98.3 F (36.8 C) (Oral)    Resp 18    Ht '5\' 6"'$  (1.676 m)    Wt 64 kg    SpO2 99%    BMI 22.77 kg/m  Pain Scale: 0-10   Pain Score: 0-No pain   SpO2: SpO2: 99 % O2 Device:SpO2: 99 % O2 Flow Rate: .     Palliative Assessment/Data: 20%     Time In: 4:50 Time Out: 6:00 Time Total: 70 min. Visit consisted of counseling and education dealing with the complex and emotionally intense issues  surrounding the need for palliative care and symptom management in the setting of serious and potentially life-threatening illness. Greater than 50%  of this time was spent counseling and coordinating care related to the above assessment and plan.  Signed by: [name], PA-C Palliative Medicine  Please contact Palliative Medicine Team phone at [phone] for questions and concerns.  For individual provider: See Shea Evans

## 2020-03-14 NOTE — Progress Notes (Signed)
Calorie Count Note  48 hour calorie count ordered.  Diet: Heart Healthy Supplements: Magic cup TID with meals, each supplement provides 290 kcal and 9 grams of protein; Ensure Max po daily, each supplement provides 150 kcal and 30 grams of protein; Glucerna Shake po BID, each supplement provides 220 kcal and 10 grams of protein  Case discussed with Dr. Gerri Lins; she agrees that intake has been insufficient. She reports that family would not be in favor of a feeding tube and is learning towards taking pt home with hospice services. Plan is to re-check labs tomorrow to further evaluate hydration status.   6/1 Breakfast: 93 kcals, 1 gram protein Lunch: 65 kcals, 5 grams protein  Total intake: 158 kcal (8% of minimum estimated needs)  6 grams protein (5% of minimum estimated needs)  6/2 Breakfast: 111 kcals, 1 gram protein Dinner: 132 kcals, 8 grams protein  Total intake: 243 kcal (12% of minimum estimated needs)  9 grams protein (8% of minimum estimated needs)  6/3 Breakfast: 25 kcals, 1 gram protein Lunch: 0% Supplements: 1 Glucerna Shake (220 kcals, 10 grams protein), 1 Ensure Max (150 kcals, 30 grams protein)  Total intake: 395 kcal (20% of minimum estimated needs)  41 protein (37% of minimum estimated needs)  6/4 Breakfast: 84 kcals, 3 grams protein Supplements 2 Glucerna Shakes (440 kcals, 20 grams protein), 1 Ensure Max (150 kcals, 30 grams protein) Total intake: 674 kcal (38% of minimum estimated needs)  53 grams protein (48% of minimum estimated needs)  Average Total intake: 368 kcal (18% of minimum estimated needs)  28 grams protein (25% of minimum estimated needs)  Nutrition Dx: Severe Malnutrition related to acute illness(CAD s/p CABG) as evidenced by energy intake < or equal to 75% for > or equal to 1 month, mild fat depletion, moderate fat depletion, moderate muscle depletion, mild muscle depletion, percent weight loss; ongoing  Goal: Patient will meet  greater than or equal to 90% of their needs; unmet  Intervention:   -D/c calorie count -MVI with minerals daily -Continue Glucerna Shake po BID, each supplement provides 220 kcal and 10 grams of protein -Continue Ensure Max po daily, each supplement provides 150 kcal and 30 grams of protein -Continue Magic cup TID with meals, each supplement provides 290 kcal and 9 grams of protein  Levada Schilling, RD, LDN, CDCES Registered Dietitian II Certified Diabetes Care and Education Specialist Please refer to AMION for RD and/or RD on-call/weekend/after hours pager

## 2020-03-14 NOTE — Progress Notes (Signed)
Pt refused to get the antibiotic ointment on her chest wound.  Pt stated "I'm tired of getting something new everyday". P twas educated.  Will continue to monitor.

## 2020-03-14 NOTE — Progress Notes (Addendum)
PROGRESS NOTE  MARGAREE SANDHU ZPH:[id number] DOB: [birth year] DOA: 03/09/2020 PCP: Luciano Cutter, PA-C  Brief History   Bianca [name] Patrickis a 74 y.o.femalewith medical history significant ofCAD s/p CABG; HTN; and DM presenting with lethargy. She had CABG 1 month ago and was subsequently hospitalized from 5/17-25 for wound dehiscence and leg swelling. She was recently discharged. Yesterday, she got up and and had a sponge bath. She went to the living room to eat and she vomited. They gave her dramamine, Pepto and a protein shak. About 6pm, she was constipated and and needed help with manual disimpaction. She had not voided all day and had small BM. She was weak but able to stand. She tried to eat and vomited again. They went to bed about 10pm and it took 2 hours to get there and she wasn't able to stand and get there. Finally, they decided to call 911. She last ate well Friday AM. She had been doing well when she left the hospital and this was an acute change, other than constipation. She gave her Miralax, pedialyte, and water with some passage of stool.   ED Course:Hypovolemia, decreased PO intake, emesis. Has CABG in March and recent hospitalization for wound dehiscence from harvest site. Wound vac in place and wound looks ok. Hypotension, SBP in 80s. Decreased appetite, not eating well, some emesis, mild abdominal discomfort. CT abdomen unremarkable. BP is fluid responsive.  The patient has been admitted to a telemetry bed. She is receiving IV fluids. She has had BM x 4 since admission. She continues to have poor PO intake of liquids and nutrition. Nutrition has been consulted and calorie count is underway. IV fluids have been discontinued so we may monitor her hydration status better.  Per nutrition the patient's caloric intake will meet only 25% of her nutritional needs. Cessation of IV fluid supplementation has resulted in mild dehydration, but not hypernatremia as per  chemistry drawn on 03/15/2020.  Consultants  . Wound Care  Procedures  . None  Antibiotics   Anti-infectives (From admission, onward)   None     Subjective  The patient is resting comfortably. No new complaints.  Objective   Vitals:  Vitals:   03/14/20 1338 03/14/20 1402  BP: 125/60 123/62  Pulse: (!) 109 (!) 108  Resp: 18 18  Temp: 98.3 F (36.8 C)   SpO2: 99% 99%   Exam:  Constitutional:  . The patient is awake, alert, and oriented x 3. No acute distress. Respiratory:  . No increased work of breathing. . No wheezes, rales, or rhonchi . No tactile fremitus Cardiovascular:  . Regular rate and rhythm . No murmurs, ectopy, or gallups. . No lateral PMI. No thrills. Abdomen:  . Abdomen is soft, non-tender, non-distended . No hernias, masses, or organomegaly . Normoactive bowel sounds.  Musculoskeletal:  . No cyanosis, clubbing, or edema Skin:  . No rashes, lesions, ulcers . palpation of skin: no induration or nodules Neurologic:  . CN 2-12 intact . Sensation all 4 extremities intact Psychiatric:  . Mental status o Mood, affect appropriate o Orientation to person, place, time  . judgment and insight appear intact  I have personally reviewed the following:   Today's Data  . Vitals, Glucoses  Micro Data  . Blood cultures x 2: No growth  Imaging  . CT abdomen and pelvis (03/09/2020)  Scheduled Meds: . acetaminophen  650 mg Oral Q6H  . aspirin  325 mg Oral Daily  . atorvastatin  80 mg Oral Daily  .  bisacodyl  10 mg Rectal Daily  . Chlorhexidine Gluconate Cloth  6 each Topical Daily  . docusate sodium  100 mg Oral BID  . [START ON 03/15/2020] dronabinol  2.5 mg Oral BID AC  . enoxaparin (LOVENOX) injection  40 mg Subcutaneous Daily  . feeding supplement (GLUCERNA SHAKE)  237 mL Oral BID BM  . insulin aspart  0-5 Units Subcutaneous QHS  . insulin aspart  0-9 Units Subcutaneous TID WC  . insulin glargine  10 Units Subcutaneous Daily  . loratadine  10  mg Oral Daily  . multivitamin with minerals  1 tablet Oral Daily  . polyethylene glycol  17 g Oral Daily  . Ensure Max Protein  11 oz Oral Daily  . sodium chloride flush  3 mL Intravenous Q12H   Continuous Infusions:   Principal Problem:   Hypotension due to hypovolemia Active Problems:   Type 2 diabetes mellitus ([facility])   Essential hypertension   Hyperlipidemia   CAD (coronary artery disease)   Wound dehiscence   Constipation   AKI (acute kidney injury) ([facility])   Leukocytosis   Pressure injury of skin   Protein-calorie malnutrition, severe   LOS: 4 days   A & P  Hypotension: Likely secondary to hypovolemia secondary to poor oral intake.  Patient with recent prior hospitalization associated with ACS and then harvest wound dehiscence.  Patient noted to have developed constipation which was felt likely causing her to limit her oral intake leading to dehydration and generalized weakness.  Patient noted to have been refusing to take her medications.  Blood pressure improving with hydration.  Infectious work-up negative to date.  Urinalysis unremarkable.  Chest x-ray negative.  Blood cultures ordered however patient refused and blood cultures reordered today.  Per daughter patient now agreeing for oral intake.  Patient noted to be eating approximately 25% of her meals.  Supportive care. Palliative care has bee consulted. Family has requested that psychiatry evaluate the patient for competency to make her own decisions.  Acute kidney injury: Secondary to prerenal azotemia in the setting of poor oral intake, NSAID use, diuretics, ARB.  Patient noted to be hypotensive on admission.  Hypotension responding to IV fluids. Urinalysis nitrite negative, leukocytes negative, 100 protein.  Renal function improving with hydration.  Continue IV fluids.  Supportive care.   Severe Protein Calorie Malnutrition: Poor PO intake of fluids and nutrition here. Nutrition consult, supplements, and calorie counts are  in place. Calorie counts have revealed that the patient is consuming only 25% of her requirements. IV fluids have been discontinued. Will check BMP in the morning to check on hydration status.  Hypertension: Continue to hold antihypertensive medications secondary to #1.    Constipation: Patient having bowel movement on regimen of MiraLAX daily and Colace.  CT abdomen and pelvis negative for any acute abnormalities.  Continue current bowel regimen. The patient has now had 2 BM's since admission.  Dehydration: IV fluids have been discontinued. Will check BMP in morning to evaluate the patient's ability to maintain her hydration status on her own.  Diabetes mellitus type 2: Hemoglobin A1c 7.1 (02/25/2020).  CBG of 104 this morning.  Patient with poor oral intake.  Change IV fluids back to normal saline as CBGs this afternoon in the 200s.  Continue heart healthy diet.  Supportive care.  Failure to thrive: Patient refusing oral intake.  Patient with poor oral intake at present with hypotension felt secondary to poor p.o. intake.  Patient states no appetite.  CT abdomen  and pelvis which was done negative for any acute abnormalities.  Patient with a leukocytosis however afebrile, chest x-ray negative for any acute infiltrates.  Urinalysis is unremarkable.  Blood cultures ordered but patient refused.  Continue IV fluids with normal saline.  Per daughter patient now agreeing to eat and about 25% of her meals. Palliative care has been consulted. Palliative care has raised the possibility that the patient may have suffered a stroke related to her recent ACS and CABG. I will check a CT head.  Hypokalemia: Magnesium level at 2.  Potassium at 3.4.  K. Dur 40 mEq p.o. x1.  Follow.  Iron deficiency anemia/anemia of chronic disease: Patient with anemia with hemoglobin of 7.4.  Patient with weakness and hypotension.  Anemia panel with a a iron level of 16, TIBC of 196.  Follow H&H.  Patient received a dose of IV  Feraheme.  Transfusion threshold hemoglobin < 7.   Hyperlipidemia: Continue statin.  Coronary artery disease with recent CABG: Continue aspirin. ARB, diuretics on hold due to hypotension.  Outpatient follow-up with cardiology.  Recent wound dehiscence: Wound VAC in place. Patient noted to have ongoing pain.  Patient noted to be taking Tylenol 650 mg every 6 hours standing dose.  Advil discontinued due to acute kidney injury.  Patient noted to have declined other pain medications at this time.  Due to limited mobility PT OT ordered and recommended SNF however patient noted to be refusing SNF and wanted to go home with home health.  Home health therapies have been ordered.  Patient has been seen by the wound care nurse.  Recommendations made. Will ask CTS to re-eval.   Leukocytosis: Likely reactive leukocytosis.  Patient afebrile.  Urinalysis unremarkable.  Chest x-ray negative for any acute infiltrates.  Patient with no respiratory symptoms.  Blood cultures ordered however patient refused yesterday.  Blood cultures were reordered today.  No need for antibiotics at this time.  Follow.  I have seen and examined this patient myself. I have spent 30 minutes in her evaluation and care.  DVT prophylaxis: Lovenox Code Status: Full Family Communication: Updated daughter and son at bedside. Disposition:   Status is: Observation  Dispo: The patient is from: Home  Anticipated d/c is to: Home with home health therapies.  Patient currently with poor oral intake, has been refusing oral intake however started with some oral intake and taking her medications.  Patient presented with constipation. She has had poor intake at home. Nutrition has been consulted. Palliative care has been consulted and it is likely that the patient will require hospice care at discharge. Awaiting psychiatry consult as requested by family and CT head as recommended by palliative care.  Kalen Neidert,  DO [facility] Direct contact: see [email or web address]  7PM-7AM contact night coverage as above 03/15/2020, 5:31 PM  LOS: 2 days

## 2020-03-14 NOTE — Progress Notes (Signed)
Patient refused to take all night medications. Explained to patient the importance of taking medications but patient was adamantly refusing. Called patients daughter Amado Coe on the phone so she could try to persuade patient. Nurse and daughter convinced patient to at least take the Tylenol so she wont suffer in pain. Patient agreed to only taking the Tylenol. Daughter stated that she will try to convince patient to take her Zoloft when she is here with her.

## 2020-03-14 NOTE — Progress Notes (Signed)
RN attempted to place DNR armband.  Per son, he misunderstood what it meant, and did not want his mom to be DNR.  Pt's daughter also communicated via phone, that she believes pt's is having depression due to the recent CABG procedure and requested to get her mom evaluated.  RN notified palliative care provider and primary MD.  Verbal order was give to discontinue DNR order.  Per MD consult will be done tomorrow.

## 2020-03-15 ENCOUNTER — Inpatient Hospital Stay (HOSPITAL_COMMUNITY): Payer: Medicare Other

## 2020-03-15 LAB — BASIC METABOLIC PANEL
Anion gap: 9 (ref 5–15)
BUN: 21 mg/dL (ref 8–23)
CO2: 22 mmol/L (ref 22–32)
Calcium: 8.2 mg/dL — ABNORMAL LOW (ref 8.9–10.3)
Chloride: 109 mmol/L (ref 98–111)
Creatinine, Ser: 0.76 mg/dL (ref 0.44–1.00)
GFR calc Af Amer: >60 mL/min (ref >60)
GFR calc non Af Amer: >60 mL/min (ref >60)
Glucose, Bld: 149 mg/dL — ABNORMAL HIGH (ref 70–99)
Potassium: 3.8 mmol/L (ref 3.5–5.1)
Sodium: 140 mmol/L (ref 135–145)

## 2020-03-15 LAB — GLUCOSE, CAPILLARY
Glucose-Capillary: 116 mg/dL — ABNORMAL HIGH (ref 70–99)
Glucose-Capillary: 176 mg/dL — ABNORMAL HIGH (ref 70–99)
Glucose-Capillary: 172 mg/dL — ABNORMAL HIGH (ref 70–99)
Glucose-Capillary: 147 mg/dL — ABNORMAL HIGH (ref 70–99)

## 2020-03-15 NOTE — Consult Note (Signed)
WOC Nurse wound follow up Wound type: Full thickness wound on RLE.  Seem by my partner, M. Austin on 6/1 and she deemed this an appropriate wound for staff nursing to change. Dressing changed on Thursday, is due for change today and then will resume a M/W/F schedule.  Discussed with Bedside RN, Tilden Fossa, Wound Treatment Associate (WTA)  Measurement:5cm x 3cm x 1cm Wound bed: Red, moist Drainage (amount, consistency, odor) scant serous Periwound:intact Dressing procedure/placement/frequency:  NPWT orders clarified for dressing change today and then M/W/F by Bedside RNs.  WOC nursing team will not follow, but will remain available to this patient, the nursing and medical teams.  Please re-consult if needed. Thanks, Ladona Mow, MSN, RN, GNP, [place]  Pager# [phone]

## 2020-03-15 NOTE — Consult Note (Signed)
Patient seen and evaluated in person by this provider.  Attempted multiple times to speak with the client but she kept drifting off to sleep.  When asked if she knew where she was, she responded with "I am at".  She then stared and closed her eyes and went to sleep.  This provider after obtaining permission from the patient spoke to her daughter and her son outside of the room.  They were concerned about her not taking her Tylenol last night because she did not want that antidepressant and "medication to make her eat".  Once the took those medications out she then took her Tylenol.  The family reports if she is in pain she will take her Tylenol and had no concerns about this.  Discussed healthcare power of attorney and obtaining this so family can make healthcare choices for the mother in the event that she cannot make her own decisions.  Both are agreeable and are pursuing this.  This provider will return tomorrow and attempt to speak with the patient again to assess for capacity.  Juanito Doom, PMH NP

## 2020-03-15 NOTE — Progress Notes (Addendum)
Daily Progress Note   Patient Name: Bianca [name]       Date: 03/15/2020 DOB: Feb 11, 1946  Age: 74 y.o. MRN#: [id number] Attending Physician: Bianca Kirks, DO Primary Care Physician: Bianca Hy, PA-C Admit Date: 03/09/2020  Reason for Consultation/Follow-up: To discuss complex medical decision making related to patient's goals of care   ADDENDUM:  Stopped in hallway by family and Bianca [name].  Requested Living Will paperwork.  Reviewed the paperwork with son and daughter. Discussed the cycle of dehydration /hospitalization / rehydration with family.  That seems to be what they have been experiencing.  Explained that that cycle is an indication of nearing EOL.  Discussed hospice services and hospice philosophy, as well as Palliative services.  Both Bianca [name] and Bianca [name] are in agreement to go home with hospice if patient continues to not eat enough to sustain herself.  Discussed DNR.  Family not ready to change that yet, but we discussed that typically when a patient goes home with hospice they are DNR but it is not a requirement.  Subjective:  Had a nice conversation with Bianca [name] (dtr) on the phone.  The patient has clearly had AMS since surgery (4/30). There has been significant confusion, somnolence, and poor PO intake.  I suggested that since the surgery the patient as developed a "dementia like" syndrome.  Perhaps she had TIA or anoxia around the surgery, or perhaps this is severe depression.   Both Bianca [name] and Bianca [name] report small improvements daily in her cognition that make them hopeful.  We discussed avoiding any type of procedure or anesthesia in the future.  Bianca [name] agreed. She does not want her mother to have a PEG or any type of procedure "unless we absolutely must do something".    Met with  patient, son, dtr, Bianca [name] at bedside.  Patient is again somnolent.  Will barely nod yes/no to questions.  Refused medications for RN - daughter is often able to convince her mother to take medications.  Patient even refusing neosporin and bandage to chest wound site.    Conveyed to [facility] that unfortunately we see patients with failure to thrive and the family tries to "eat for them" or "take their medicines for them" that may have some success in the short term - but eventually if the patient does not eat and do for herself  even the strongest family efforts will fail.  Bianca [name] acknowledges that she understands.  Patient reports change in taste sensation.    Assessment: 74 yo with complex post op course after CABG.  Difficult wound healing and altered mental status.  Poor PO intake with failure to thrive.  Very supportive family.   Patient Profile/HPI:  74 y.o. female  with past medical history of CAD s/p CABG recently in 2021, DM, constipation and hypertension, who was admitted on 03/09/2020 with lethargy.  She was hospitalized 5/17 - 5/25 for wound dehiscence and leg swelling at the harvest site.  Since her surgery April 30th the patient has failed to thrive.  She has had 5 hospitalizations in 3 months and is no longer eating enough to sustain herself long term.   PMT was consulted for goals of care.    Length of Stay: 5   Vital Signs: BP 122/61 (BP Location: Left Arm)   Pulse 99   Temp 98.5 F (36.9 C) (Oral)   Resp 17   Ht _0  (1.676 m)   Wt 64 kg   SpO2 99%   BMI 22.77 kg/m  SpO2: SpO2: 99 % O2 Device: O2 Device: Room Air O2 Flow Rate:         Palliative Assessment/Data:  20%     Palliative Care Plan    Recommendations/Plan:  Will encourage out of bed.  Per RN this is very difficult as patient is very weak.  It took 4 people to get her to the bedside commode.  Non-healing wounds post op.    Still will only sips and bites as PO intake.  Qualifies for Home Hospice  services if family approves.  Other wise strongly recommend at least outpatient Palliative support after discharge.  I am suspicious of possible CVA or anoxic event recently - if you feel it would be helpful considering brain imaging for AMS. Change in taste sensation with refractory confusion.  Code Status:  Full code - family concerned that she made a decision for DNR because she was in a depressed state.  Need follow up discussion with daughter / patient at appropriate time.   However daughter agrees she would not do well with any type of procedure.  Prognosis:   < 6 months.  More likely less than three months with current PO intake.   Discharge Planning:  To Be Determined Home with home health vs home with Hospice services.  Care plan was discussed with family, bedside RN and Dr. Benny [name]  Thank you for allowing the Palliative Medicine Team to assist in the care of this patient.  Total time spent:  60 min.     Greater than 50%  of this time was spent counseling and coordinating care related to the above assessment and plan.  Bianca Jenny, PA-C Palliative Medicine  Please contact Palliative MedicineTeam phone at [phone] for questions and concerns between 7 am - 7 pm.   Please see AMION for individual provider pager numbers.

## 2020-03-16 DIAGNOSIS — R400 Somnolence: Secondary | ICD-10-CM

## 2020-03-16 DIAGNOSIS — R439 Unspecified disturbances of smell and taste: Secondary | ICD-10-CM

## 2020-03-16 LAB — CULTURE, BLOOD (ROUTINE X 2)
Culture: NO GROWTH
Culture: NO GROWTH
Special Requests: ADEQUATE
Special Requests: ADEQUATE

## 2020-03-16 LAB — GLUCOSE, CAPILLARY
Glucose-Capillary: 94 mg/dL (ref 70–99)
Glucose-Capillary: 141 mg/dL — ABNORMAL HIGH (ref 70–99)
Glucose-Capillary: 127 mg/dL — ABNORMAL HIGH (ref 70–99)
Glucose-Capillary: 128 mg/dL — ABNORMAL HIGH (ref 70–99)

## 2020-03-16 LAB — LIPID PANEL
Cholesterol: 49 mg/dL (ref 0–200)
HDL: 13 mg/dL — ABNORMAL LOW (ref >40)
LDL Cholesterol: 22 mg/dL (ref 0–99)
Total CHOL/HDL Ratio: 3.8 RATIO
Triglycerides: 69 mg/dL (ref <150)
VLDL: 14 mg/dL (ref 0–40)

## 2020-03-16 MED ORDER — LORAZEPAM 0.5 MG PO TABS
0.2500 mg | ORAL_TABLET | Freq: Once | ORAL | Status: DC
  Filled 2020-03-16: qty 1

## 2020-03-16 MED ORDER — SIMVASTATIN 20 MG PO TABS: 20.0000 mg | ORAL_TABLET | Freq: Every day | ORAL | Status: DC

## 2020-03-16 NOTE — Progress Notes (Signed)
PROGRESS NOTE  Bianca [name] RJJ:[id number] DOB: 1946-06-16 DOA: 03/09/2020 PCP: Luciano Cutter, PA-C  Brief History   Bianca [name] a 74 y.o.femalewith medical history significant ofCAD s/p CABG; HTN; and DM presenting with lethargy. She had CABG 1 month ago and was subsequently hospitalized from 5/17-25 for wound dehiscence and leg swelling. She was recently discharged. Yesterday, she got up and and had a sponge bath. She went to the living room to eat and she vomited. They gave her dramamine, Pepto and a protein shak. About 6pm, she was constipated and and needed help with manual disimpaction. She had not voided all day and had small BM. She was weak but able to stand. She tried to eat and vomited again. They went to bed about 10pm and it took 2 hours to get there and she wasn't able to stand and get there. Finally, they decided to call 911. She last ate well Friday AM. She had been doing well when she left the hospital and this was an acute change, other than constipation. She gave her Miralax, pedialyte, and water with some passage of stool.   ED Course:Hypovolemia, decreased PO intake, emesis. Has CABG in March and recent hospitalization for wound dehiscence from harvest site. Wound vac in place and wound looks ok. Hypotension, SBP in 80s. Decreased appetite, not eating well, some emesis, mild abdominal discomfort. CT abdomen unremarkable. BP is fluid responsive.  The patient has been admitted to a telemetry bed. She is receiving IV fluids. She has had BM x 4 since admission. She continues to have poor PO intake of liquids and nutrition. Nutrition has been consulted and calorie count is underway. IV fluids have been discontinued so we may monitor her hydration status better.  Per nutrition the patient's caloric intake will meet only 25% of her nutritional needs. Cessation of IV fluid supplementation has resulted in mild dehydration, but not hypernatremia as per  chemistry drawn on 03/15/2020.  Consultants  . Wound Care  Procedures  . None  Antibiotics   Anti-infectives (From admission, onward)   None     Subjective  The patient is resting comfortably. No new complaints.  Objective   Vitals:  Vitals:   03/16/20 0309 03/16/20 0427  BP: 118/65 120/61  Pulse: 97 98  Resp: 16 17  Temp: 98.9 F (37.2 C) 98.7 F (37.1 C)  SpO2: 98% 99%   Exam:  Constitutional:  . The patient is awake, alert, and oriented x 3. No acute distress. Respiratory:  . No increased work of breathing. . No wheezes, rales, or rhonchi . No tactile fremitus Cardiovascular:  . Regular rate and rhythm . No murmurs, ectopy, or gallups. . No lateral PMI. No thrills. Abdomen:  . Abdomen is soft, non-tender, non-distended . No hernias, masses, or organomegaly . Normoactive bowel sounds.  Musculoskeletal:  . No cyanosis, clubbing, or edema Skin:  . No rashes, lesions, ulcers . palpation of skin: no induration or nodules Neurologic:  . CN 2-12 intact . Sensation all 4 extremities intact Psychiatric:  . Mental status o Mood, affect appropriate o Orientation to person, place, time  . judgment and insight appear intact  I have personally reviewed the following:   Today's Data  . Vitals, Glucoses  Micro Data  . Blood cultures x 2: No growth  Imaging  . CT abdomen and pelvis (03/09/2020)  Scheduled Meds: . acetaminophen  650 mg Oral Q6H  . aspirin  325 mg Oral Daily  . atorvastatin  80 mg Oral  Daily  . bisacodyl  10 mg Rectal Daily  . Chlorhexidine Gluconate Cloth  6 each Topical Daily  . docusate sodium  100 mg Oral BID  . dronabinol  2.5 mg Oral BID AC  . enoxaparin (LOVENOX) injection  40 mg Subcutaneous Daily  . feeding supplement (GLUCERNA SHAKE)  237 mL Oral BID BM  . insulin aspart  0-5 Units Subcutaneous QHS  . insulin aspart  0-9 Units Subcutaneous TID WC  . insulin glargine  10 Units Subcutaneous Daily  . loratadine  10 mg Oral Daily   . multivitamin with minerals  1 tablet Oral Daily  . neomycin-bacitracin-polymyxin   Topical Daily  . polyethylene glycol  17 g Oral Daily  . Ensure Max Protein  11 oz Oral Daily  . sertraline  25 mg Oral Daily  . sodium chloride flush  3 mL Intravenous Q12H   Continuous Infusions:   Principal Problem:   Hypotension due to hypovolemia Active Problems:   Type 2 diabetes mellitus (HCC)   Essential hypertension   Hyperlipidemia   CAD (coronary artery disease)   Wound dehiscence   Constipation   AKI (acute kidney injury) (HCC)   Leukocytosis   Pressure injury of skin   Protein-calorie malnutrition, severe   Eating disorder   FTT (failure to thrive) in adult   Palliative care encounter   LOS: 6 days   A & P  Hypotension: Likely secondary to hypovolemia secondary to poor oral intake.  Patient with recent prior hospitalization associated with ACS and then harvest wound dehiscence.  Patient noted to have developed constipation which was felt likely causing her to limit her oral intake leading to dehydration and generalized weakness.  Patient noted to have been refusing to take her medications.  Blood pressure improving with hydration.  Infectious work-up negative to date.  Urinalysis unremarkable.  Chest x-ray negative.  Blood cultures ordered however patient refused and blood cultures reordered today.  Per daughter patient now agreeing for oral intake.  Patient noted to be eating approximately 25% of her meals.  Supportive care. Palliative care has bee consulted. Family has requested that psychiatry evaluate the patient for competency to make her own decisions.  Acute kidney injury: Secondary to prerenal azotemia in the setting of poor oral intake, NSAID use, diuretics, ARB.  Patient noted to be hypotensive on admission.  Hypotension responding to IV fluids. Urinalysis nitrite negative, leukocytes negative, 100 protein.  Renal function improving with hydration.  Continue IV fluids.   Supportive care.   Severe Protein Calorie Malnutrition: Poor PO intake of fluids and nutrition here. Nutrition consult, supplements, and calorie counts are in place. Calorie counts have revealed that the patient is consuming only 25% of her requirements. IV fluids have been discontinued. Will check BMP in the morning to check on hydration status.  Hypertension: Continue to hold antihypertensive medications secondary to #1.    Constipation: Patient having bowel movement on regimen of MiraLAX daily and Colace.  CT abdomen and pelvis negative for any acute abnormalities.  Continue current bowel regimen. The patient has now had 2 BM's since admission.  Dehydration: IV fluids have been discontinued. Will check BMP in morning to evaluate the patient's ability to maintain her hydration status on her own.  Diabetes mellitus type 2: Hemoglobin A1c 7.1 (02/25/2020).  CBG of 104 this morning.  Patient with poor oral intake.  Change IV fluids back to normal saline as CBGs this afternoon in the 200s.  Continue heart healthy diet.  Supportive care.  Failure to thrive: Patient refusing oral intake.  Patient with poor oral intake at present with hypotension felt secondary to poor p.o. intake.  Patient states no appetite.  CT abdomen and pelvis which was done negative for any acute abnormalities.  Patient with a leukocytosis however afebrile, chest x-ray negative for any acute infiltrates.  Urinalysis is unremarkable.  Blood cultures ordered but patient refused.  Continue IV fluids with normal saline.  Per daughter patient now agreeing to eat and about 25% of her meals. Palliative care has been consulted. Palliative care has raised the possibility that the patient may have suffered a stroke related to her recent ACS and CABG. I will check a CT head.  Hypokalemia: Magnesium level at 2.  Potassium at 3.4.  K. Dur 40 mEq p.o. x1.  Follow.  Iron deficiency anemia/anemia of chronic disease: Patient with anemia with  hemoglobin of 7.4.  Patient with weakness and hypotension.  Anemia panel with a a iron level of 16, TIBC of 196.  Follow H&H.  Patient received a dose of IV Feraheme.  Transfusion threshold hemoglobin < 7.   Hyperlipidemia: Continue statin.  Coronary artery disease with recent CABG: Continue aspirin. ARB, diuretics on hold due to hypotension.  Outpatient follow-up with cardiology.  Recent wound dehiscence: Wound VAC in place. Patient noted to have ongoing pain.  Patient noted to be taking Tylenol 650 mg every 6 hours standing dose.  Advil discontinued due to acute kidney injury.  Patient noted to have declined other pain medications at this time.  Due to limited mobility PT OT ordered and recommended SNF however patient noted to be refusing SNF and wanted to go home with home health.  Home health therapies have been ordered.  Patient has been seen by the wound care nurse.  Recommendations made. Will ask CTS to re-eval.   Leukocytosis: Likely reactive leukocytosis.  Patient afebrile.  Urinalysis unremarkable.  Chest x-ray negative for any acute infiltrates.  Patient with no respiratory symptoms.  Blood cultures ordered however patient refused yesterday.  Blood cultures were reordered today.  No need for antibiotics at this time.  Follow.  I have seen and examined this patient myself. I have spent 28 minutes in her evaluation and care.  DVT prophylaxis: Lovenox Code Status: Full Family Communication: Updated daughter and son at bedside. Disposition:   Status is: Observation  Dispo: The patient is from: Home  Anticipated d/c is to: Home with home health therapies.  Patient currently with poor oral intake, has been refusing oral intake however started with some oral intake and taking her medications.  Patient presented with constipation. She has had poor intake at home. Nutrition has been consulted. Palliative care has been consulted and it is likely that the patient  will require hospice care at discharge. Awaiting psychiatry consult as requested by family and CT head as recommended by palliative care.  Jaqwon Manfred, DO [facility] Direct contact: see [email or web address]  7PM-7AM contact night coverage as above 03/15/2020, 4:31 PM  LOS: 2 days

## 2020-03-16 NOTE — Care Management (Signed)
Patient's daughter refused hospice services after speaking with [facility] RN due to hesitation to d/c wound vac.  [facility] contacted at [facility] request to inquire about their wound vac procedures.  Per hospital liaison, they also d/c wound vacs, but have wound care nurses that would assess/treat wounds.  TOC will follow for disposition after WOC RN assessment tomorrow.

## 2020-03-16 NOTE — Plan of Care (Signed)
  Problem: Elimination: Goal: Will not experience complications related to bowel motility Outcome: Progressing Goal: Will not experience complications related to urinary retention Outcome: Progressing   Problem: Pain Managment: Goal: General experience of comfort will improve Outcome: Progressing   Problem: Safety: Goal: Ability to remain free from injury will improve Outcome: Progressing   

## 2020-03-16 NOTE — Consult Note (Signed)
[facility] Face-to-Face Psychiatry Consult   Reason for Consult:  Capacity Referring Physician:  Dr Benny Lennert Patient Identification: Bianca [name] MRN:  [id number] Principal Diagnosis: Hypotension due to hypovolemia Diagnosis:  Principal Problem:   Hypotension due to hypovolemia Active Problems:   FTT (failure to thrive) in adult   Type 2 diabetes mellitus ([place])   Essential hypertension   Hyperlipidemia   CAD (coronary artery disease)   Wound dehiscence   Constipation   AKI (acute kidney injury) ([place])   Leukocytosis   Pressure injury of skin   Protein-calorie malnutrition, severe   Eating disorder   Palliative care encounter  Total Time spent with patient: 1 hour  Subjective:   Bianca [name] is a 74 y.o. female patient admitted with complications from CABG.  Patient seen and evaluated in person today.  Attempted back capacity was made yesterday but client was too drowsy.  Today she is sitting upright in her recliner watching television.  Asked how she was doing, she responded, "I am doing okay."  She was able to tell me that she was at [facility].  When asked the reason for her being in the hospital.  She stated "my daughters birthday (long pause) came before mine."  Continues to stare off when asked questions and not able to give appropriate answers after the initial response.  She capacity to make medical decisions at this time.  This provider met with her family yesterday and encouraged him to obtain a healthcare power of attorney.  They also evidently met with palliative care and per the notes have the patient return home in the care of hospice.  HPI per MD:   Bianca [name] is a 74 y.o. female with medical history significant of CAD s/p CABG; HTN; and DM presenting with lethargy.  She had CABG 1 month ago and was subsequently hospitalized from 5/17-25 for wound dehiscence and leg swelling.   She was recently discharged.  Yesterday, she got up and and had a sponge bath.  She went to  the living room to eat and she vomited.  They gave her dramamine, Pepto and a protein shak.  About 6pm, she was constipated and and needed help with manual disimpaction.  She had not voided all day and had small BM.  She was weak but able to stand.  She tried to eat and vomited again.  They went to bed about 10pm and it took 2 hours to get there and she wasn't able to stand and get there.  Finally, they decided to call 911.  She last ate well Friday AM.  She had been doing well when she left the hospital and this was an acute change, other than constipation.  She gave her Miralax, pedialyte, and water with some passage of stool.    ED Course:  Hypovolemia, decreased PO intake, emesis.  Has CABG in March and recent hospitalization for wound dehiscence from harvest site. Wound vac in place and wound looks ok.  Hypotension, SBP in 80s.  Decreased appetite, not eating well, some emesis, mild abdominal discomfort. CT abdomen unremarkable.  BP is fluid responsive.  Past Psychiatric History: none  Risk to Self:  none Risk to Others:  none Prior Inpatient Therapy:  none per notes and family Prior Outpatient Therapy:  none  Past Medical History:  Past Medical History:  Diagnosis Date  . CAD (coronary artery disease) 01/31/2020   CABG  . Diabetes mellitus without complication ([place])   . Dyspnea   .  Hypertension   . Hypertensive urgency 01/06/2020    Past Surgical History:  Procedure Laterality Date  . CORONARY ARTERY BYPASS GRAFT N/A 02/08/2020   Procedure: CORONARY ARTERY BYPASS GRAFTING (CABG) TIMES FOUR USING LEFT INTERNAL MAMMARY ARTERY AND RIGHT SAPHENOUS LEG VEIN HARVESTED ENDOSCOPICALLY;  Surgeon: Lajuana Matte, MD;  Location: [place];  Service: Open Heart Surgery;  Laterality: N/A;  . LEFT HEART CATH AND CORONARY ANGIOGRAPHY N/A 01/31/2020   Procedure: LEFT HEART CATH AND CORONARY ANGIOGRAPHY;  Surgeon: Lorretta Harp, MD;  Location: [place] [facility];  Service: Cardiovascular;   Laterality: N/A;  . TEE WITHOUT CARDIOVERSION N/A 02/08/2020   Procedure: TRANSESOPHAGEAL ECHOCARDIOGRAM (TEE);  Surgeon: Lajuana Matte, MD;  Location: [place];  Service: Open Heart Surgery;  Laterality: N/A;   Family History:  Family History  Problem Relation Age of Onset  . Diabetes Mother   . Diabetes Sister   . Diabetes Brother   . Heart disease Brother    Family Psychiatric  History: None Social History:  Social History   Substance and Sexual Activity  Alcohol Use No     Social History   Substance and Sexual Activity  Drug Use No    Social History   Socioeconomic History  . Marital status: Divorced    Spouse name: Not on file  . Number of children: Not on file  . Years of education: Not on file  . Highest education level: Not on file  Occupational History  . Occupation: retired  Tobacco Use  . Smoking status: Former Smoker    Packs/day: 0.50    Years: 56.00    Pack years: 28.00    Types: Cigarettes    Quit date: 12/31/2019    Years since quitting: 0.2  . Smokeless tobacco: Never Used  Substance and Sexual Activity  . Alcohol use: No  . Drug use: No  . Sexual activity: Not on file  Other Topics Concern  . Not on file  Social History Narrative  . Not on file   Social Determinants of Health   Financial Resource Strain:   . Difficulty of Paying Living Expenses:   Food Insecurity:   . Worried About Charity fundraiser in the Last Year:   . Arboriculturist in the Last Year:   Transportation Needs:   . Film/video editor (Medical):   Marland Kitchen Lack of Transportation (Non-Medical):   Physical Activity:   . Days of Exercise per Week:   . Minutes of Exercise per Session:   Stress:   . Feeling of Stress :   Social Connections:   . Frequency of Communication with Friends and Family:   . Frequency of Social Gatherings with Friends and Family:   . Attends Religious Services:   . Active Member of Clubs or Organizations:   . Attends Archivist  Meetings:   Marland Kitchen Marital Status:    Additional Social History:    Allergies:   Allergies  Allergen Reactions  . Tramadol Nausea And Vomiting    Labs:  Results for orders placed or performed during the hospital encounter of 03/09/20 (from the past 48 hour(s))  Glucose, capillary     Status: Abnormal   Collection Time: 03/14/20  5:47 PM  Result Value Ref Range   Glucose-Capillary 141 (H) 70 - 99 mg/dL    Comment: Glucose reference range applies only to samples taken after fasting for at least 8 hours.  Glucose, capillary     Status: Abnormal  Collection Time: 03/14/20  9:39 PM  Result Value Ref Range   Glucose-Capillary 149 (H) 70 - 99 mg/dL    Comment: Glucose reference range applies only to samples taken after fasting for at least 8 hours.  Basic metabolic panel     Status: Abnormal   Collection Time: 03/15/20  1:08 AM  Result Value Ref Range   Sodium 140 135 - 145 mmol/L   Potassium 3.8 3.5 - 5.1 mmol/L   Chloride 109 98 - 111 mmol/L   CO2 22 22 - 32 mmol/L   Glucose, Bld 149 (H) 70 - 99 mg/dL    Comment: Glucose reference range applies only to samples taken after fasting for at least 8 hours.   BUN 21 8 - 23 mg/dL   Creatinine, Ser 0.76 0.44 - 1.00 mg/dL   Calcium 8.2 (L) 8.9 - 10.3 mg/dL   GFR calc non Af Amer >60 >60 mL/min   GFR calc Af Amer >60 >60 mL/min   Anion gap 9 5 - 15    Comment: Performed at [facility] [address], [place], [place] [address]  Glucose, capillary     Status: Abnormal   Collection Time: 03/15/20  8:22 AM  Result Value Ref Range   Glucose-Capillary 116 (H) 70 - 99 mg/dL    Comment: Glucose reference range applies only to samples taken after fasting for at least 8 hours.  Glucose, capillary     Status: Abnormal   Collection Time: 03/15/20 12:47 PM  Result Value Ref Range   Glucose-Capillary 176 (H) 70 - 99 mg/dL    Comment: Glucose reference range applies only to samples taken after fasting for at least 8 hours.  Glucose,  capillary     Status: Abnormal   Collection Time: 03/15/20  5:06 PM  Result Value Ref Range   Glucose-Capillary 172 (H) 70 - 99 mg/dL    Comment: Glucose reference range applies only to samples taken after fasting for at least 8 hours.  Glucose, capillary     Status: Abnormal   Collection Time: 03/15/20  9:30 PM  Result Value Ref Range   Glucose-Capillary 147 (H) 70 - 99 mg/dL    Comment: Glucose reference range applies only to samples taken after fasting for at least 8 hours.  Glucose, capillary     Status: None   Collection Time: 03/16/20  7:55 AM  Result Value Ref Range   Glucose-Capillary 94 70 - 99 mg/dL    Comment: Glucose reference range applies only to samples taken after fasting for at least 8 hours.   Comment 1 Notify RN    Comment 2 Document in Chart   Glucose, capillary     Status: Abnormal   Collection Time: 03/16/20 11:19 AM  Result Value Ref Range   Glucose-Capillary 141 (H) 70 - 99 mg/dL    Comment: Glucose reference range applies only to samples taken after fasting for at least 8 hours.   Comment 1 Notify RN     Current Facility-Administered Medications  Medication Dose Route Frequency Provider Last Rate Last Admin  . acetaminophen (TYLENOL) tablet 650 mg  650 mg Oral Q6H Karmen Bongo, MD   650 mg at 03/16/20 1442  . aspirin EC tablet 325 mg  325 mg Oral Daily Karmen Bongo, MD   325 mg at 03/16/20 1053  . atorvastatin (LIPITOR) tablet 80 mg  80 mg Oral Daily Karmen Bongo, MD   80 mg at 03/16/20 1054  . bisacodyl (DULCOLAX) suppository  10 mg  10 mg Rectal Daily Eugenie Filler, MD   Stopped at 03/13/20 [phone]  . Chlorhexidine Gluconate Cloth 2 % PADS 6 each  6 each Topical Daily Karmen Bongo, MD   6 each at 03/16/20 1100  . docusate sodium (COLACE) capsule 100 mg  100 mg Oral BID Karmen Bongo, MD   100 mg at 03/14/20 0945  . dronabinol (MARINOL) capsule 2.5 mg  2.5 mg Oral BID AC Dellinger, Marianne L, PA-C   2.5 mg at 03/16/20 1222  . enoxaparin  (LOVENOX) injection 40 mg  40 mg Subcutaneous Daily Steenwyk, Yujing Z, RPH   40 mg at 03/16/20 1053  . feeding supplement (GLUCERNA SHAKE) (GLUCERNA SHAKE) liquid 237 mL  237 mL Oral BID BM Swayze, Ava, DO   237 mL at 03/16/20 1441  . hydrALAZINE (APRESOLINE) injection 5 mg  5 mg Intravenous Q4H PRN Karmen Bongo, MD      . ibuprofen (ADVIL) tablet 400 mg  400 mg Oral Q6H PRN Swayze, Ava, DO   400 mg at 03/14/20 0549  . insulin aspart (novoLOG) injection 0-5 Units  0-5 Units Subcutaneous QHS Karmen Bongo, MD      . insulin aspart (novoLOG) injection 0-9 Units  0-9 Units Subcutaneous TID WC Karmen Bongo, MD   1 Units at 03/16/20 1222  . insulin glargine (LANTUS) injection 10 Units  10 Units Subcutaneous Daily Karmen Bongo, MD   10 Units at 03/16/20 1053  . loratadine (CLARITIN) tablet 10 mg  10 mg Oral Daily Karmen Bongo, MD   10 mg at 03/16/20 1052  . multivitamin with minerals tablet 1 tablet  1 tablet Oral Daily Swayze, Ava, DO   1 tablet at 03/16/20 1053  . neomycin-bacitracin-polymyxin (NEOSPORIN) ointment   Topical Daily Dellinger, Bobby Rumpf, PA-C   Given at 03/16/20 1223  . ondansetron (ZOFRAN) tablet 4 mg  4 mg Oral Q6H PRN Karmen Bongo, MD       Or  . ondansetron [facility]) injection 4 mg  4 mg Intravenous Q6H PRN Karmen Bongo, MD      . polyethylene glycol (MIRALAX / GLYCOLAX) packet 17 g  17 g Oral Daily Karmen Bongo, MD   17 g at 03/14/20 3016  . protein supplement (ENSURE MAX) liquid  11 oz Oral Daily Swayze, Ava, DO   11 oz at 03/15/20 0952  . sertraline (ZOLOFT) tablet 25 mg  25 mg Oral Daily Dellinger, Marianne L, PA-C   25 mg at 03/16/20 1053  . sodium chloride flush (NS) 0.9 % injection 3 mL  3 mL Intravenous Q12H Karmen Bongo, MD   3 mL at 03/15/20 2136    Musculoskeletal: Strength & Muscle Tone: decreased Gait & Station: did not witness Patient leans: N/A  Psychiatric Specialty Exam: Physical Exam  Nursing note and vitals  reviewed. Constitutional: She appears well-developed.  HENT:  Head: Normocephalic.  Respiratory: Effort normal.  Musculoskeletal:        General: Normal range of motion.     Cervical back: Normal range of motion.  Neurological: She is alert.  Psychiatric: Judgment and thought content normal. Her affect is blunt. Her speech is delayed. She is slowed. Cognition and memory are impaired.    Review of Systems  Musculoskeletal:       Right leg pain per family   Psychiatric/Behavioral: Positive for dysphoric mood.  All other systems reviewed and are negative.   Blood pressure 120/61, pulse 98, temperature 98.7 F (37.1 C), temperature source Oral, resp. rate  17, height _0  (1.676 m), weight 64 kg, SpO2 99 %.Body mass index is 22.77 kg/m.  General Appearance: Casual  Eye Contact:  Fair  Speech:  Slow  Volume:  Decreased  Mood:  Depressed  Affect:  Congruent  Thought Process:  Irrelevant  Orientation:  Other:  alert, oriented to place and person  Thought Content:  Illogical  Suicidal Thoughts:  No  Homicidal Thoughts:  No  Memory:  Immediate;   Poor Recent;   Poor Remote;   Poor  Judgement:  Impaired  Insight:  Lacking  Psychomotor Activity:  Decreased  Concentration:  Concentration: Poor and Attention Span: Poor  Recall:  Poor  Fund of Knowledge:  unable to assess based on her current state  Language:  Fair  Akathisia:  No  Handed:  Right  AIMS (if indicated):     Assets:  Housing Leisure Time Resilience Social Support  ADL's:  Impaired  Cognition:  Impaired,  Moderate to severe  Sleep:       Treatment Plan Summary: Capacity evaluation: Patient does not have capacity at this time to make sound medical decisions based on her cognitive state.  Encouraged her family to obtain healthcare power of attorney, they are in the process.  Disposition: No evidence of imminent risk to self or others at present.   Patient does not meet criteria for psychiatric inpatient  admission.  Waylan Boga, NP 03/16/2020 2:47 PM

## 2020-03-16 NOTE — Progress Notes (Signed)
Civil engineer, contracting (ACC)  Received request from [place], Palliative Medical Team PA, for hospice services at home after discharge.  Chart and pt information under review by [facility] physician.  Hospice eligibility pending at this time.  Hospital liaison spoke at length with daughter Amado Coe to initiate education related to hospice philosophy and services and to answer any questions at this time.  Joletta verbalized concern about transitioning her mom off of the wound vac and onto some other form of wound care.  She shared that the pain and complication of the wound was the factor which led to a poor recovery s/p CABG, and that from her point of view discontinuing the wound vac would be unacceptable.  Plan made with Clerance Lav, PMT PA, to have wound care RN assess on Monday for a timeline for dc'ing wound vac.  On a subsequent call Amado Coe reports she and her brother are not yet ready for hospice services for their mother at this time.    [facility] information and contact numbers given to daughter Amado Coe who reports plan to call [facility] when they feel ready.  Above information shared with Pollie Friar Manager.  Please call with any questions or concerns.  Thank you for the opportunity to participate in this pt's care.  Gillian Scarce, BSN, RN [employer] [phone] (24h on call)

## 2020-03-16 NOTE — Progress Notes (Signed)
PROGRESS NOTE  Bianca [name] SNK:[id number] DOB: [birth year] DOA: 03/09/2020 PCP: Bianca Cutter, PA-C  Brief History   Bianca [name] a 74 y.o.femalewith medical history significant ofCAD s/p CABG; HTN; and DM presenting with lethargy. She had CABG 1 month ago and was subsequently hospitalized from 5/17-25 for wound dehiscence and leg swelling. She was recently discharged. Yesterday, she got up and and had a sponge bath. She went to the living room to eat and she vomited. They gave her dramamine, Pepto and a protein shak. About 6pm, she was constipated and and needed help with manual disimpaction. She had not voided all day and had small BM. She was weak but able to stand. She tried to eat and vomited again. They went to bed about 10pm and it took 2 hours to get there and she wasn't able to stand and get there. Finally, they decided to call 911. She last ate well Friday AM. She had been doing well when she left the hospital and this was an acute change, other than constipation. She gave her Miralax, pedialyte, and water with some passage of stool.   ED Course:Hypovolemia, decreased PO intake, emesis. Has CABG in March and recent hospitalization for wound dehiscence from harvest site. Wound vac in place and wound looks ok. Hypotension, SBP in 80s. Decreased appetite, not eating well, some emesis, mild abdominal discomfort. CT abdomen unremarkable. BP is fluid responsive.  The patient has been admitted to a telemetry bed. She is receiving IV fluids. She has had BM x 4 since admission. She continues to have poor PO intake of liquids and nutrition. Nutrition has been consulted and calorie count is underway. IV fluids have been discontinued so we may monitor her hydration status better.  Per nutrition the patient's caloric intake will meet only 25% of her nutritional needs. Cessation of IV fluid supplementation has resulted in mild dehydration, but not hypernatremia as per  chemistry drawn on 03/15/2020.  CT head was ordered as it seemed that the patient has had a significant loss of function since her CABG. CT head demonstrated a subacute basal ganglia infarct.  Consultants  . Wound Care  Procedures  . None  Antibiotics   Anti-infectives (From admission, onward)   None     Subjective  The patient is resting comfortably. No new complaints.  Objective   Vitals:  Vitals:   03/16/20 0427 03/16/20 1508  BP: 120/61 125/66  Pulse: 98 98  Resp: 17 18  Temp: 98.7 F (37.1 C) 98.1 F (36.7 C)  SpO2: 99% 97%   Exam:  Constitutional:  . The patient is awake, alert, and oriented x 3. No acute distress. Respiratory:  . No increased work of breathing. . No wheezes, rales, or rhonchi . No tactile fremitus Cardiovascular:  . Regular rate and rhythm . No murmurs, ectopy, or gallups. . No lateral PMI. No thrills. Abdomen:  . Abdomen is soft, non-tender, non-distended . No hernias, masses, or organomegaly . Normoactive bowel sounds.  Musculoskeletal:  . No cyanosis, clubbing, or edema Skin:  . No rashes, lesions, ulcers . palpation of skin: no induration or nodules Neurologic:  . CN 2-12 intact . Sensation all 4 extremities intact Psychiatric:  . Mental status o Mood, affect appropriate o Orientation to person, place, time  . judgment and insight appear intact  I have personally reviewed the following:   Today's Data  . Vitals, Glucoses  Micro Data  . Blood cultures x 2: No growth  Imaging  . CT  abdomen and pelvis (03/09/2020) . CT head  Scheduled Meds: . acetaminophen  650 mg Oral Q6H  . aspirin  325 mg Oral Daily  . atorvastatin  80 mg Oral Daily  . bisacodyl  10 mg Rectal Daily  . Chlorhexidine Gluconate Cloth  6 each Topical Daily  . docusate sodium  100 mg Oral BID  . dronabinol  2.5 mg Oral BID AC  . enoxaparin (LOVENOX) injection  40 mg Subcutaneous Daily  . feeding supplement (GLUCERNA SHAKE)  237 mL Oral BID BM  .  insulin aspart  0-5 Units Subcutaneous QHS  . insulin aspart  0-9 Units Subcutaneous TID WC  . insulin glargine  10 Units Subcutaneous Daily  . loratadine  10 mg Oral Daily  . LORazepam  0.25 mg Oral Once  . multivitamin with minerals  1 tablet Oral Daily  . neomycin-bacitracin-polymyxin   Topical Daily  . polyethylene glycol  17 g Oral Daily  . Ensure Max Protein  11 oz Oral Daily  . sertraline  25 mg Oral Daily  . sodium chloride flush  3 mL Intravenous Q12H   Continuous Infusions:   Principal Problem:   Hypotension due to hypovolemia Active Problems:   Type 2 diabetes mellitus (HCC)   Essential hypertension   Hyperlipidemia   CAD (coronary artery disease)   Wound dehiscence   Constipation   AKI (acute kidney injury) (HCC)   Leukocytosis   Pressure injury of skin   Protein-calorie malnutrition, severe   Eating disorder   FTT (failure to thrive) in adult   Palliative care encounter   Somnolence   Disturbances of sensation of smell and taste   LOS: 6 days   A & P  Hypotension: Likely secondary to hypovolemia secondary to poor oral intake.  Patient with recent prior hospitalization associated with ACS and then harvest wound dehiscence.  Patient noted to have developed constipation which was felt likely causing her to limit her oral intake leading to dehydration and generalized weakness.  Patient noted to have been refusing to take her medications.  Blood pressure improving with hydration.  Infectious work-up negative to date.  Urinalysis unremarkable.  Chest x-ray negative.  Blood cultures ordered however patient refused and blood cultures reordered today.  Per daughter patient now agreeing for oral intake.  Patient noted to be eating approximately 25% of her meals.  Supportive care. Palliative care has bee consulted. Family has requested that psychiatry evaluate the patient for competency to make her own decisions.  Acute kidney injury: Secondary to prerenal azotemia in the  setting of poor oral intake, NSAID use, diuretics, ARB.  Patient noted to be hypotensive on admission.  Hypotension responding to IV fluids. Urinalysis nitrite negative, leukocytes negative, 100 protein.  Renal function improving with hydration.  Continue IV fluids.  Supportive care.   CVA: CT of the head demonstrated a subacute infarct of the left basal ganglia. Stroke work up in place. Will consult neurology in the am. The patient is receiving ASA daily. I have added simvastatin pending the result of a lipid panel.  Severe Protein Calorie Malnutrition: Poor PO intake of fluids and nutrition here. Nutrition consult, supplements, and calorie counts are in place. Calorie counts have revealed that the patient is consuming only 25% of her requirements. IV fluids have been discontinued. Will check BMP in the morning to check on hydration status.  Hypertension: Continue to hold antihypertensive medications secondary to #1.    Constipation: Patient having bowel movement on regimen of MiraLAX daily  and Colace.  CT abdomen and pelvis negative for any acute abnormalities.  Continue current bowel regimen. The patient has now had 2 BM's since admission.  Dehydration: IV fluids have been discontinued. Will check BMP in morning to evaluate the patient's ability to maintain her hydration status on her own.  Diabetes mellitus type 2: Hemoglobin A1c 7.1 (02/25/2020).  CBG of 104 this morning.  Patient with poor oral intake.  Change IV fluids back to normal saline as CBGs this afternoon in the 200s.  Continue heart healthy diet.  Supportive care.  Failure to thrive: Patient refusing oral intake.  Patient with poor oral intake at present with hypotension felt secondary to poor p.o. intake.  Patient states no appetite.  CT abdomen and pelvis which was done negative for any acute abnormalities.  Patient with a leukocytosis however afebrile, chest x-ray negative for any acute infiltrates.  Urinalysis is unremarkable.   Blood cultures ordered but patient refused.  Continue IV fluids with normal saline.  Per daughter patient now agreeing to eat and about 25% of her meals. Palliative care has been consulted. Palliative care has raised the possibility that the patient may have suffered a stroke related to her recent ACS and CABG. I will check a CT head.  Hypokalemia: Magnesium level at 2.  Potassium at 3.4.  K. Dur 40 mEq p.o. x1.  Follow.  Iron deficiency anemia/anemia of chronic disease: Patient with anemia with hemoglobin of 7.4.  Patient with weakness and hypotension.  Anemia panel with a a iron level of 16, TIBC of 196.  Follow H&H.  Patient received a dose of IV Feraheme.  Transfusion threshold hemoglobin < 7.   Hyperlipidemia: Continue statin.  Coronary artery disease with recent CABG: Continue aspirin. ARB, diuretics on hold due to hypotension.  Outpatient follow-up with cardiology.  Recent wound dehiscence: Wound VAC in place. Patient noted to have ongoing pain.  Patient noted to be taking Tylenol 650 mg every 6 hours standing dose.  Advil discontinued due to acute kidney injury.  Patient noted to have declined other pain medications at this time.  Due to limited mobility PT OT ordered and recommended SNF however patient noted to be refusing SNF and wanted to go home with home health.  Home health therapies have been ordered.  Patient has been seen by the wound care nurse.  Recommendations made. Will ask CTS to re-eval.   Leukocytosis: Likely reactive leukocytosis.  Patient afebrile.  Urinalysis unremarkable.  Chest x-ray negative for any acute infiltrates.  Patient with no respiratory symptoms.  Blood cultures ordered however patient refused yesterday.  Blood cultures were reordered today.  No need for antibiotics at this time.  Follow.  I have seen and examined this patient myself. I have spent 38 minutes in her evaluation and care.  DVT prophylaxis: Lovenox Code Status: Full Family Communication:  Updated daughter and son at bedside. Disposition:   Status is: Observation  Dispo: The patient is from: Home  Anticipated d/c is to: Home with home health therapies.  Patient currently with poor oral intake, has been refusing oral intake however started with some oral intake and taking her medications.  Patient presented with constipation. She has had poor intake at home. Nutrition has been consulted. Palliative care has been consulted and it is likely that the patient will require hospice care at discharge. Awaiting psychiatry consult as requested by family and CT head as recommended by palliative care.   Javona Bergevin, DO [facility] Direct contact: see [email or web address]  7PM-7AM contact night coverage as above 03/16/2020, 5:46 PM  LOS: 2 days

## 2020-03-16 NOTE — Consult Note (Signed)
WOC Nurse Consult Note: Consult requested for input on when NPWT can be discontinued in favor of more traditional wound therapy/dressings.  This action can take place at ny time: patient has received benefit from wound therapy and can transition to other modalities without deleterious effect.  Recommend once or twice daily using NS dampened gauze topped with dry gauze and secured with a few turns of Kerlix roll gauze/paper tape.  Caregivers may be taught this dressing and the wound monitored by HHRN/Hospice RN.  WOC nursing team will not follow, but will remain available to this patient, the nursing and medical teams.  Please re-consult if needed. Thanks, Ladona Mow, MSN, RN, GNP, [place]  Pager# [phone]

## 2020-03-16 NOTE — Progress Notes (Signed)
Daily Progress Note   Patient Name: Bianca [name]       Date: 03/16/2020 DOB: [birth year]  Age: 74 y.o. MRN#: [id number] Attending Physician: Karie Kirks, DO Primary Care Physician: Julian Hy, PA-C Admit Date: 03/09/2020  Reason for Consultation/Follow-up: To discuss complex medical decision making related to patient's goals of care  Subjective: Met with patient at bedside along with daughter and son.  Patient is brighter and more alert today sitting in recliner chair.  She has taken only sips and bites.  She requires 2+ total assist to move from bed to chair.  She gives 1 word answers to simple questions and seems to tire easily.  Family asks about the results of the CT scan yesterday.  She has a small stroke (age indeterminate) in the basal ganglia.  Without prior CTs for comparison we do not know when that happened.  I explained to family that a stroke in the basal ganglia may impact her balance and ability to ambulate.    Patient is on a 325 mg aspirin and lipitor 80 mg already.  We discussed going home with hospice.  Another family member has had experience with [facility] and recommended them.   Assessment: Patient with failure to thrive picture after CABG.  Significant cognitive changes. No longer ambulatory.  Wound vac required for non-healing wound at right leg harvest site.  Poor PO intake. Small basal ganglia infarct found on CT.   Patient Profile/HPI:  75 y.o. female  with past medical history of CAD s/p CABG recently in 2021, DM, constipation and hypertension, who was admitted on 03/09/2020 with lethargy.  She was hospitalized 5/17 - 5/25 for wound dehiscence and leg swelling at the harvest site.  Since her surgery April 30th the patient has failed to thrive.  She has had  5 hospitalizations in 3 months and is no longer eating enough to sustain herself long term.   PMT was consulted for goals of care.    Length of Stay: 6   Vital Signs: BP 125/66 (BP Location: Left Arm)   Pulse 98   Temp 98.1 F (36.7 C) (Oral)   Resp 18   Ht _0  (1.676 m)   Wt 64 kg   SpO2 97%   BMI 22.77 kg/m  SpO2: SpO2: 97 % O2 Device: O2 Device: Room  Air O2 Flow Rate:         Palliative Assessment/Data:  20%     Palliative Care Plan    Recommendations/Plan:  Home with hospice services.  TOC order placed.  Family requested [facility].  I contacted [facility].  Dr. Benny Lennert ordered MRI.  Discussed with daughter.  Ordered very low dose oral ativan at her request. Discussed with bedside RN.  Code Status:  Full code  Prognosis:   < 6 months after CABG (4/30) very poor PO intake.  Loss of mobility.  Altered mental status.  Basal ganglia stroke.  5 hospitalizations in 3 months.  Recurrent dehydration due to poor PO intake.  Discharge Planning:  Home with Hospice  Care plan was discussed with family, RN, Dr. Benny Lennert  Thank you for allowing the Palliative Medicine Team to assist in the care of this patient.  Total time spent:  60 min.     Greater than 50%  of this time was spent counseling and coordinating care related to the above assessment and plan.  [name], PA-C Palliative Medicine  Please contact Palliative MedicineTeam phone at [phone] for questions and concerns between 7 am - 7 pm.   Please see AMION for individual provider pager numbers.

## 2020-03-17 ENCOUNTER — Encounter (HOSPITAL_COMMUNITY): Payer: Medicare Other

## 2020-03-17 ENCOUNTER — Inpatient Hospital Stay (HOSPITAL_COMMUNITY): Payer: Medicare Other

## 2020-03-17 DIAGNOSIS — Z951 Presence of aortocoronary bypass graft: Secondary | ICD-10-CM

## 2020-03-17 DIAGNOSIS — I639 Cerebral infarction, unspecified: Secondary | ICD-10-CM

## 2020-03-17 DIAGNOSIS — Z7189 Other specified counseling: Secondary | ICD-10-CM

## 2020-03-17 LAB — GLUCOSE, CAPILLARY
Glucose-Capillary: 119 mg/dL — ABNORMAL HIGH (ref 70–99)
Glucose-Capillary: 99 mg/dL (ref 70–99)
Glucose-Capillary: 177 mg/dL — ABNORMAL HIGH (ref 70–99)
Glucose-Capillary: 152 mg/dL — ABNORMAL HIGH (ref 70–99)

## 2020-03-17 NOTE — Plan of Care (Signed)
  Problem: Education: Goal: Knowledge of General Education information will improve Description Including pain rating scale, medication(s)/side effects and non-pharmacologic comfort measures Outcome: Progressing   

## 2020-03-17 NOTE — Procedures (Signed)
Echo attempted. Patient refusing echo at this time.

## 2020-03-17 NOTE — Progress Notes (Signed)
Carotid artery duplex completed. Refer to "CV Proc" under chart review to view preliminary results.  03/17/2020 2:42 PM Eula Fried., MHA, RVT, RDCS, RDMS

## 2020-03-17 NOTE — Progress Notes (Signed)
Patent examiner [facility])  Hospital Liaison: RN note         Notified by Palliative Care Team for [facility] of patient/family request for [facility] Palliative services at home after discharge.        Discharge plan unclear at this time. Hospital liaison will follow to coordinate with the [facility] Palliative at Discharge.             Please call with any hospice or palliative related questions.         Thank you for this referral.         Elsie Saas, RN, CCM  [facility] Hospital Liaison (listed on AMION under Hospice/[facility])    [phone]

## 2020-03-17 NOTE — Progress Notes (Signed)
Physical Therapy Treatment Patient Details Name: Bianca [name] MRN: [id number] DOB: 06-30-1946 Today's Date: 03/17/2020    History of Present Illness 74 y.o. female with medical history significant of CAD s/p CABG; HTN; and DM presenting with lethargy.  She had CABG 1 month ago and was subsequently hospitalized from 5/17-25 for wound dehiscence and leg swelling. Pt with recent history of vomiting, limited P.O. intake, and weakness.    PT Comments    Pt is in bed with some assistance required for any movement, esp to initiate as she is not very verbal and very still.  Pt tolerated PT assisting with ROM to legs, had greatest struggle to flex R hip gently.  Pt is still in wound vac on RLE, repositioned leg back on pillow for floating of heel and protection of skin and vac.  Follow acutely to get OOB next session if possible.   Follow Up Recommendations  SNF     Equipment Recommendations  Hospital bed;Other (comment);Wheelchair (measurements PT);Wheelchair cushion (measurements PT)    Recommendations for Other Services       Precautions / Restrictions Precautions Precautions: Fall;Sternal Precaution Booklet Issued: No Restrictions Weight Bearing Restrictions: No RUE Weight Bearing: Partial weight bearing RUE Partial Weight Bearing Percentage or Pounds: sternal precautions LUE Weight Bearing: Partial weight bearing LUE Partial Weight Bearing Percentage or Pounds: Sternal precautions    Mobility  Bed Mobility Overal bed mobility: Needs Assistance             General bed mobility comments: pt repositioned in bed to pull up with  Transfers                 General transfer comment: deferred due to pt refusal  Ambulation/Gait                 Stairs             Wheelchair Mobility    Modified Rankin (Stroke Patients Only)       Balance Overall balance assessment: Needs assistance                                           Cognition Arousal/Alertness: Awake/alert Behavior During Therapy: Flat affect Overall Cognitive Status: Impaired/Different from baseline Area of Impairment: Awareness;Safety/judgement;Following commands;Attention;Problem solving                   Current Attention Level: Selective Memory: Decreased recall of precautions;Decreased short-term memory Following Commands: Follows one step commands inconsistently;Follows one step commands with increased time Safety/Judgement: Decreased awareness of safety;Decreased awareness of deficits Awareness: Intellectual Problem Solving: Slow processing;Requires verbal cues;Requires tactile cues;Decreased initiation General Comments: continues to be minimally verbal      Exercises General Exercises - Lower Extremity Ankle Circles/Pumps: AROM;AAROM;5 reps Quad Sets: AROM;10 reps Heel Slides: AAROM;10 reps;PROM Hip ABduction/ADduction: AAROM;10 reps Straight Leg Raises: AAROM;10 reps Hip Flexion/Marching: AAROM;10 reps    General Comments General comments (skin integrity, edema, etc.): Pt was assisted to do ROM to legs in bed, but was too fatigued for transfer to chair.  Son was in and made chair available but pt declines to use it      Pertinent Vitals/Pain Pain Assessment: 0-10 Pain Score: 8  Pain Location: RLE with ROM Pain Descriptors / Indicators: Grimacing;Guarding Pain Intervention(s): Limited activity within patient's tolerance;Monitored during session;Premedicated before session;Repositioned    Home Living  Prior Function            PT Goals (current goals can now be found in the care plan section) Acute Rehab PT Goals Patient Stated Goal: none reported    Frequency    Min 3X/week      PT Plan Current plan remains appropriate    Co-evaluation              AM-PAC PT "6 Clicks" Mobility   Outcome Measure  Help needed turning from your back to your side while in a flat bed  without using bedrails?: A Lot Help needed moving from lying on your back to sitting on the side of a flat bed without using bedrails?: Total Help needed moving to and from a bed to a chair (including a wheelchair)?: Total Help needed standing up from a chair using your arms (e.g., wheelchair or bedside chair)?: Total Help needed to walk in hospital room?: Total Help needed climbing 3-5 steps with a railing? : Total 6 Click Score: 7    End of Session   Activity Tolerance: Patient limited by fatigue;Patient limited by pain Patient left: in bed;with call bell/phone within reach;with bed alarm set;with family/visitor present Nurse Communication: Mobility status PT Visit Diagnosis: Muscle weakness (generalized) (M62.81);Unsteadiness on feet (R26.81)     Time: 7829-5621 PT Time Calculation (min) (ACUTE ONLY): 25 min  Charges:  $Therapeutic Exercise: 23-37 mins                     Ramond Dial 03/17/2020, 6:23 PM  Mee Hives, PT MS Acute Rehab Dept. Number: [place] and [place]

## 2020-03-17 NOTE — Progress Notes (Signed)
PROGRESS NOTE  KESHAUNA Bianca [name] KKX:[id number] DOB: 11-12-1945 DOA: 03/09/2020 PCP: Luciano Cutter, PA-C  Brief History   Bianca [name] a 74 y.o.femalewith medical history significant ofCAD s/p CABG; HTN; and DM presenting with lethargy. She had CABG 1 month ago and was subsequently hospitalized from 5/17-25 for wound dehiscence and leg swelling. She was recently discharged. Yesterday, she got up and and had a sponge bath. She went to the living room to eat and she vomited. They gave her dramamine, Pepto and a protein shak. About 6pm, she was constipated and and needed help with manual disimpaction. She had not voided all day and had small BM. She was weak but able to stand. She tried to eat and vomited again. They went to bed about 10pm and it took 2 hours to get there and she wasn't able to stand and get there. Finally, they decided to call 911. She last ate well Friday AM. She had been doing well when she left the hospital and this was an acute change, other than constipation. She gave her Miralax, pedialyte, and water with some passage of stool.   ED Course:Hypovolemia, decreased PO intake, emesis. Has CABG in March and recent hospitalization for wound dehiscence from harvest site. Wound vac in place and wound looks ok. Hypotension, SBP in 80s. Decreased appetite, not eating well, some emesis, mild abdominal discomfort. CT abdomen unremarkable. BP is fluid responsive.  The patient has been admitted to a telemetry bed. She is receiving IV fluids. She has had BM x 4 since admission. She continues to have poor PO intake of liquids and nutrition. Nutrition has been consulted and calorie count is underway. IV fluids have been discontinued so we may monitor her hydration status better.  Per nutrition the patient's caloric intake will meet only 25% of her nutritional needs. Cessation of IV fluid supplementation has resulted in mild dehydration, but not hypernatremia as per  chemistry drawn on 03/15/2020.  CT head was ordered as it seemed that the patient has had a significant loss of function since her CABG. CT head demonstrated a subacute basal ganglia infarct. MRI revealed subacute/chronic infarcts of the Globus pallidus, right low parietal lobe, and bilateral frontal lobes. The infarcts are described as small. Bilateral carotid dopplers are negative for hemodynamically significant stenosis. Echocardiogram is pending.  Consultants  . Wound Care  Procedures  . None  Antibiotics   Anti-infectives (From admission, onward)   None     Subjective  The patient is resting comfortably. No new complaints.  Objective   Vitals:  Vitals:   03/17/20 0426 03/17/20 1003  BP: 136/66 (!) 130/59  Pulse: 98 99  Resp: 18 14  Temp: 98.8 F (37.1 C) 99 F (37.2 C)  SpO2: 97% 100%   Exam:  Constitutional:  . The patient is awake, alert, and oriented x 3. No acute distress. Respiratory:  . No increased work of breathing. . No wheezes, rales, or rhonchi . No tactile fremitus Cardiovascular:  . Regular rate and rhythm . No murmurs, ectopy, or gallups. . No lateral PMI. No thrills. Abdomen:  . Abdomen is soft, non-tender, non-distended . No hernias, masses, or organomegaly . Normoactive bowel sounds.  Musculoskeletal:  . No cyanosis, clubbing, or edema Skin:  . No rashes, lesions, ulcers . palpation of skin: no induration or nodules Neurologic:  . CN 2-12 intact . Sensation all 4 extremities intact Psychiatric:  . Mental status o Mood, affect appropriate o Orientation to person, place, time  . judgment  and insight appear intact  I have personally reviewed the following:   Today's Data  . Vitals, Glucoses  Micro Data  . Blood cultures x 2: No growth  Imaging  . CT abdomen and pelvis (03/09/2020) . CT head  Scheduled Meds: . acetaminophen  650 mg Oral Q6H  . aspirin  325 mg Oral Daily  . atorvastatin  80 mg Oral Daily  . bisacodyl  10 mg  Rectal Daily  . Chlorhexidine Gluconate Cloth  6 each Topical Daily  . docusate sodium  100 mg Oral BID  . dronabinol  2.5 mg Oral BID AC  . enoxaparin (LOVENOX) injection  40 mg Subcutaneous Daily  . feeding supplement (GLUCERNA SHAKE)  237 mL Oral BID BM  . insulin aspart  0-5 Units Subcutaneous QHS  . insulin aspart  0-9 Units Subcutaneous TID WC  . insulin glargine  10 Units Subcutaneous Daily  . loratadine  10 mg Oral Daily  . LORazepam  0.25 mg Oral Once  . multivitamin with minerals  1 tablet Oral Daily  . neomycin-bacitracin-polymyxin   Topical Daily  . polyethylene glycol  17 g Oral Daily  . Ensure Max Protein  11 oz Oral Daily  . sertraline  25 mg Oral Daily  . sodium chloride flush  3 mL Intravenous Q12H   Continuous Infusions:   Principal Problem:   Hypotension due to hypovolemia Active Problems:   Type 2 diabetes mellitus (HCC)   Essential hypertension   Hyperlipidemia   CAD (coronary artery disease)   Wound dehiscence   Constipation   AKI (acute kidney injury) (HCC)   Leukocytosis   Pressure injury of skin   Protein-calorie malnutrition, severe   Eating disorder   FTT (failure to thrive) in adult   Palliative care encounter   Somnolence   Disturbances of sensation of smell and taste   LOS: 7 days   A & P  Hypotension: Likely secondary to hypovolemia secondary to poor oral intake.  Patient with recent prior hospitalization associated with ACS and then harvest wound dehiscence.  Patient noted to have developed constipation which was felt likely causing her to limit her oral intake leading to dehydration and generalized weakness.  Patient noted to have been refusing to take her medications.  Blood pressure improving with hydration.  Infectious work-up negative to date.  Urinalysis unremarkable.  Chest x-ray negative.  Blood cultures ordered however patient refused and blood cultures reordered today.  Per daughter patient now agreeing for oral intake.  Patient  noted to be eating approximately 25% of her meals.  Supportive care. Palliative care has bee consulted. Family has requested that psychiatry evaluate the patient for competency to make her own decisions.  Acute kidney injury: Secondary to prerenal azotemia in the setting of poor oral intake, NSAID use, diuretics, ARB.  Patient noted to be hypotensive on admission.  Hypotension responding to IV fluids. Urinalysis nitrite negative, leukocytes negative, 100 protein.  Renal function improving with hydration.  Continue IV fluids.  Supportive care.   CVA: CT of the head demonstrated a subacute infarct of the left basal ganglia. Stroke work up in place. Will consult neurology in the am. The patient is receiving ASA daily. I have added simvastatin pending the result of a lipid panel. MRI revealed subacute/chronic infarcts of the Globus pallidus, right low parietal lobe, and bilateral frontal lobes. The infarcts are described as small. Bilateral carotid dopplers are negative for hemodynamically significant stenosis. Echocardiogram is pending.  Severe Protein Calorie Malnutrition: Poor  PO intake of fluids and nutrition here. Nutrition consult, supplements, and calorie counts are in place. Calorie counts have revealed that the patient is consuming only 25% of her requirements. IV fluids have been discontinued. Will check BMP in the morning to check on hydration status.  Hypertension: Continue to hold antihypertensive medications secondary to #1.    Constipation: Patient having bowel movement on regimen of MiraLAX daily and Colace.  CT abdomen and pelvis negative for any acute abnormalities.  Continue current bowel regimen. The patient has now had 2 BM's since admission.  Dehydration: IV fluids have been discontinued. Will check BMP in morning to evaluate the patient's ability to maintain her hydration status on her own.  Diabetes mellitus type 2: Hemoglobin A1c 7.1 (02/25/2020).  CBG of 104 this morning.   Patient with poor oral intake.  Change IV fluids back to normal saline as CBGs this afternoon in the 200s.  Continue heart healthy diet.  Supportive care.  Failure to thrive: Patient refusing oral intake.  Patient with poor oral intake at present with hypotension felt secondary to poor p.o. intake.  Patient states no appetite.  CT abdomen and pelvis which was done negative for any acute abnormalities.  Patient with a leukocytosis however afebrile, chest x-ray negative for any acute infiltrates.  Urinalysis is unremarkable.  Blood cultures ordered but patient refused.  Continue IV fluids with normal saline.  Per daughter patient now agreeing to eat and about 25% of her meals. Palliative care has been consulted. Palliative care has raised the possibility that the patient may have suffered a stroke related to her recent ACS and CABG. The patient has suffered small infarcts of the basal ganglia, low right parietal lobe, and bilateral frontal lobes. Work up is in place.  Hypokalemia: Magnesium level at 2.  Potassium at 3.4.  K. Dur 40 mEq p.o. x1.  Follow.  Iron deficiency anemia/anemia of chronic disease: Patient with anemia with hemoglobin of 7.4.  Patient with weakness and hypotension.  Anemia panel with a a iron level of 16, TIBC of 196.  Follow H&H.  Patient received a dose of IV Feraheme.  Transfusion threshold hemoglobin < 7.   Hyperlipidemia: Continue statin.  Coronary artery disease with recent CABG: Continue aspirin. ARB, diuretics on hold due to hypotension.  Outpatient follow-up with cardiology.  Recent wound dehiscence: Wound VAC in place. Patient noted to have ongoing pain.  Patient noted to be taking Tylenol 650 mg every 6 hours standing dose.  Advil discontinued due to acute kidney injury.  Patient noted to have declined other pain medications at this time.  Due to limited mobility PT OT ordered and recommended SNF however patient noted to be refusing SNF and wanted to go home with home  health.  Home health therapies have been ordered.  Patient has been seen by the wound care nurse.  Recommendations made. Will ask CTS to re-eval.   Leukocytosis: Likely reactive leukocytosis.  Patient afebrile.  Urinalysis unremarkable.  Chest x-ray negative for any acute infiltrates.  Patient with no respiratory symptoms.  Blood cultures ordered however patient refused yesterday.  Blood cultures were reordered today.  No need for antibiotics at this time.  Follow.  I have seen and examined this patient myself. I have spent 38 minutes in her evaluation and care.  DVT prophylaxis: Lovenox Code Status: Full Family Communication: Updated daughter and son at bedside. Disposition:   Status is: Observation  Dispo: The patient is from: Home  Anticipated d/c is to: Home  with home health therapies.  Patient currently with poor oral intake, has been refusing oral intake however started with some oral intake and taking her medications.  Patient presented with constipation. She has had poor intake at home. Nutrition has been consulted. Palliative care has been consulted and it is likely that the patient will require hospice care at discharge. Awaiting psychiatry consult as requested by family and CT head as recommended by palliative care. Also need to complete stroke work up.  Kiyoto Slomski, DO [facility] Direct contact: see [email or web address]  7PM-7AM contact night coverage as above 03/17/2020, 3:47 PM  LOS: 2 days

## 2020-03-17 NOTE — Plan of Care (Signed)
  Problem: Education: Goal: Knowledge of General Education information will improve Description: Including pain rating scale, medication(s)/side effects and non-pharmacologic comfort measures Outcome: Progressing   Problem: Health Behavior/Discharge Planning: Goal: Ability to manage health-related needs will improve Outcome: Progressing  Questions about plan of care answered for daughter.  Problem: Pain Managment: Goal: General experience of comfort will improve Outcome: Progressing  Patient denies pain.

## 2020-03-17 NOTE — Progress Notes (Signed)
Transport here to take patient down to MRI. Pt is alert, denies pain or needs. 06/07/20201 @ 0659 Manson Allan, RN

## 2020-03-17 NOTE — Progress Notes (Signed)
Daily Progress Note   Patient Name: Bianca [name]       Date: 03/17/2020 DOB: 1946-04-09  Age: 74 y.o. MRN#: [id number] Attending Physician: Karie Kirks, DO Primary Care Physician: Julian Hy, PA-C Admit Date: 03/09/2020  Reason for Consultation/Follow-up: Disposition, Non pain symptom management, Pain control and Psychosocial/spiritual support  Subjective: Patient lying in bed awake and alert - she is able to participate in partial conversation but does show periods of confusion. She denied pain. She showed no non-verbal signs of pain or shortness of breath.   GOC: Went to visit patient at bedside - Bianca [name]/daughter and Bianca [name]/son were present.   Discussed patient's oral intake - Bianca [name] stated that Bianca [name] is eating/drinking minimal amounts. Her lunch tray was sitting on the counter with about 10% eaten.  In regards to her functional status, Bianca [name] stated that she still does seem very weak. He is in hopes that PT/OT will continue to work with her while she is here in the hospital. He would like for her to get up to the chair for a couple hours a day. Conversation was had around her functional limitations and he does understand that getting Bianca [name] up to the chair requires a max assist of 3+ people. Bianca [name] is in the belief that Bianca [name]'s functional decline is a part of the recovery process from surgery. However, he did understand that her current poor PO intake can create further weakness and decline.   Son requested that a female tech come to perform echocardiogram - he thinks the patient refused the exam due to the female tech.  I attempted to elicit values and goals of care important to the patient and family.    Conversation was had around feeding tubes in light of  Bianca [name]'s poor PO intake. Bianca [name] stated that they did not want to ever pursue a feeding tube route. Discussed comfort feeds and ongoing assist and encouragement.   We discussed patient's current illness and what it means in the larger context of patient's on-going co-morbidities.    When asked about discharge goals, Bianca [name] is very firm in his wishes to bring Bianca [name] home with home health. Continued conversation was had on how most of Bianca [name]'s needs/care will fall to the family. He was in understanding. Education was provided on different options for family  support at home.   After NP left the room, separate conversation was had with Bianca [name]/daughter outside the patient's room. Discussed again hospice services and support as an option at home. Bianca [name] would like the wound vac to remain in place until it is medically recommended to remove, not just because it "can" be removed. She stated that originally they were told the wound vac only needed to be in place for around three weeks - which means this Friday 03/21/20 was the anticipated date of removal. Right now, hospice services will not care for wound vac, which is a barrier. Bianca [name] stated that once the wound vac had an appropriate medical recommendation for removal, she and her brother would be in agreement to utilize hospice services at home.   Discussed with patient/family the importance of continued conversation with family and the medical providers regarding overall plan of care and treatment options, ensuring decisions are within the context of the patient's values and GOCs.    Questions and concerns were addressed. The family was encouraged to call with questions or concerns. PMT contact information given.    Length of Stay: 7  Current Medications: Scheduled Meds:  . acetaminophen  650 mg Oral Q6H  . aspirin  325 mg Oral Daily  . atorvastatin  80 mg Oral Daily  . bisacodyl  10 mg Rectal Daily  . Chlorhexidine Gluconate Cloth   6 each Topical Daily  . docusate sodium  100 mg Oral BID  . dronabinol  2.5 mg Oral BID AC  . enoxaparin (LOVENOX) injection  40 mg Subcutaneous Daily  . feeding supplement (GLUCERNA SHAKE)  237 mL Oral BID BM  . insulin aspart  0-5 Units Subcutaneous QHS  . insulin aspart  0-9 Units Subcutaneous TID WC  . insulin glargine  10 Units Subcutaneous Daily  . loratadine  10 mg Oral Daily  . LORazepam  0.25 mg Oral Once  . multivitamin with minerals  1 tablet Oral Daily  . neomycin-bacitracin-polymyxin   Topical Daily  . polyethylene glycol  17 g Oral Daily  . Ensure Max Protein  11 oz Oral Daily  . sertraline  25 mg Oral Daily  . sodium chloride flush  3 mL Intravenous Q12H    Continuous Infusions:   PRN Meds: hydrALAZINE, ibuprofen, ondansetron **OR** ondansetron (ZOFRAN) IV  Physical Exam Vitals and nursing note reviewed.  Cardiovascular:     Rate and Rhythm: Normal rate.  Pulmonary:     Effort: No respiratory distress.  Skin:    General: Skin is warm and dry.  Neurological:     Mental Status: She is alert.  Psychiatric:        Mood and Affect: Mood normal.        Behavior: Behavior is cooperative.        Cognition and Memory: Cognition is impaired.             Vital Signs: BP (!) 130/59 (BP Location: Left Arm)   Pulse 99   Temp 99 F (37.2 C) (Oral)   Resp 14   Ht 5\' 6"  (1.676 m)   Wt 64 kg   SpO2 100%   BMI 22.77 kg/m  SpO2: SpO2: 100 % O2 Device: O2 Device: Room Air O2 Flow Rate:    Intake/output summary:   Intake/Output Summary (Last 24 hours) at 03/17/2020 1631 Last data filed at 03/17/2020 0930 Gross per 24 hour  Intake 200 ml  Output --  Net 200 ml   LBM: Last BM Date: 03/16/20  Baseline Weight: Weight: 64 kg Most recent weight: Weight: 64 kg       Palliative Assessment/Data: PPS 20%      Patient Active Problem List   Diagnosis Date Noted  . Somnolence   . Disturbances of sensation of smell and taste   . Eating disorder   . FTT (failure  to thrive) in adult   . Palliative care encounter   . Protein-calorie malnutrition, severe 03/13/2020  . Pressure injury of skin 03/12/2020  . Leukocytosis   . Hypotension due to hypovolemia 03/09/2020  . Constipation 03/09/2020  . AKI (acute kidney injury) (HCC) 03/09/2020  . Malnutrition of moderate degree 02/29/2020  . Wound dehiscence 02/25/2020  . S/P CABG x 4 02/08/2020  . Coronary artery disease 02/08/2020  . Chest pain 02/03/2020  . Unstable angina (HCC) 02/03/2020  . Hypokalemia 02/03/2020  . CAD (coronary artery disease) 01/31/2020  . Pain due to onychomycosis of toenails of both feet 01/30/2020  . Chest pain of uncertain etiology 01/29/2020  . Non-STEMI (non-ST elevated myocardial infarction) (HCC) 01/29/2020  . Essential hypertension 01/29/2020  . Hyperlipidemia 01/29/2020  . Acute diastolic CHF (congestive heart failure) (HCC) 01/06/2020  . CHF (congestive heart failure) (HCC) 01/06/2020  . Noncompliance with medication regimen 08/17/2018  . Noncompliance with diagnostic testing 08/17/2018  . Type 2 diabetes mellitus (HCC) 04/17/2008  . TOBACCO ABUSE 04/17/2008    Palliative Care Assessment & Plan   Patient Profile: 74 y.o.femalewith past medical history of CAD s/p CABG recently in 2021, DM, constipation and hypertension,who was admitted on5/30/2021with lethargy.She was hospitalized 5/17 - 5/25 for wound dehiscence and leg swelling at the harvest site.Since her surgery April 30th the patient has failed to thrive. She has had 5 hospitalizations in 3 months and is no longer eating enough to sustain herself long term. PMT was consulted for goals of care.  Assessment: Acute kidney injury CVA  Severe protein calorie malnutrition  Constipation Failure to thrive History of recent CABG Recent wound dehiscence   Recommendations/Plan:  Optimize and continue current medical treatments  Continue full code status  Family's goal is for patient to return  home with home health if wound vac is still in place when discharged  Monitor for medically appropriate time to discontinue wound vac - can initiate home hospice per family request once this takes place. Needs ongoing discussions regarding hospice philosophy.   If patient does not discharge with home hospice, refer for outpatient Palliative Care follow up.   PMT will continue to monitor holistically  Goals of Care and Additional Recommendations:  Limitations on Scope of Treatment: Full Scope Treatment  Code Status:  Full Code  Code Status History    Date Active Date Inactive Code Status Order ID Comments User Context   03/14/2020 1722 03/14/2020 1831 DNR [id number]  Trevor Iha, PA-C Inpatient   03/09/2020 0955 03/14/2020 1722 Full Code [id number]  Jonah Blue, MD ED   02/25/2020 1403 03/04/2020 2142 Full Code [id number]  Inez Catalina, MD Inpatient   02/03/2020 0149 02/15/2020 1715 Full Code [id number]  Thalia Party, DO ED   01/31/2020 1747 02/01/2020 1849 Full Code [id number]  Runell Gess, MD Inpatient   01/06/2020 1410 01/07/2020 1749 Full Code [id number]  Emeline General, MD ED   Advance Care Planning Activity    Questions for Most Recent Historical Code Status (Order [id number])    Question Answer Comment   In the event of cardiac or respiratory ARREST Do not call a "code blue"  In the event of cardiac or respiratory ARREST Do not perform Intubation, CPR, defibrillation or ACLS    In the event of cardiac or respiratory ARREST Use medication by any route, position, wound care, and other measures to relive pain and suffering. May use oxygen, suction and manual treatment of airway obstruction as needed for comfort.       Prognosis:   Unable to determine: poor long-term  Discharge Planning:  Home with Palliative Services  Care plan was discussed with primary RN, Bianca [name]/son, Bianca [name]/daughter, palliative/hospice liaison   Thank you for allowing the Palliative Medicine  Team to assist in the care of this patient.   Total Time 40 Prolonged Time Billed  no       Greater than 50%  of this time was spent counseling and coordinating care related to the above assessment and plan.  Haskel Khan, NP   Vennie Homans, FNP-C Palliative Medicine Team  Phone: [phone] Fax: [phone]  Please contact Palliative Medicine Team phone at [phone] for questions and concerns.

## 2020-03-18 ENCOUNTER — Inpatient Hospital Stay (HOSPITAL_COMMUNITY): Payer: Medicare Other

## 2020-03-18 DIAGNOSIS — I340 Nonrheumatic mitral (valve) insufficiency: Secondary | ICD-10-CM

## 2020-03-18 DIAGNOSIS — Z515 Encounter for palliative care: Secondary | ICD-10-CM

## 2020-03-18 DIAGNOSIS — I6330 Cerebral infarction due to thrombosis of unspecified cerebral artery: Secondary | ICD-10-CM | POA: Insufficient documentation

## 2020-03-18 LAB — CBC WITH DIFFERENTIAL/PLATELET
Abs Immature Granulocytes: 0.28 10*3/uL — ABNORMAL HIGH (ref 0.00–0.07)
Basophils Absolute: 0 10*3/uL (ref 0.0–0.1)
Basophils Relative: 0 %
Eosinophils Absolute: 0.1 10*3/uL (ref 0.0–0.5)
Eosinophils Relative: 0 %
HCT: 24.3 % — ABNORMAL LOW (ref 36.0–46.0)
Hemoglobin: 7.3 g/dL — ABNORMAL LOW (ref 12.0–15.0)
Immature Granulocytes: 2 %
Lymphocytes Relative: 7 %
Lymphs Abs: 1.2 10*3/uL (ref 0.7–4.0)
MCH: 26.4 pg (ref 26.0–34.0)
MCHC: 30 g/dL (ref 30.0–36.0)
MCV: 88 fL (ref 80.0–100.0)
Monocytes Absolute: 0.9 10*3/uL (ref 0.1–1.0)
Monocytes Relative: 5 %
Neutro Abs: 15.9 10*3/uL — ABNORMAL HIGH (ref 1.7–7.7)
Neutrophils Relative %: 86 %
Platelets: 349 10*3/uL (ref 150–400)
RBC: 2.76 MIL/uL — ABNORMAL LOW (ref 3.87–5.11)
RDW: 17.7 % — ABNORMAL HIGH (ref 11.5–15.5)
WBC: 18.4 10*3/uL — ABNORMAL HIGH (ref 4.0–10.5)
nRBC: 0.1 % (ref 0.0–0.2)

## 2020-03-18 LAB — BASIC METABOLIC PANEL
Anion gap: 10 (ref 5–15)
BUN: 12 mg/dL (ref 8–23)
CO2: 22 mmol/L (ref 22–32)
Calcium: 7.9 mg/dL — ABNORMAL LOW (ref 8.9–10.3)
Chloride: 105 mmol/L (ref 98–111)
Creatinine, Ser: 0.72 mg/dL (ref 0.44–1.00)
GFR calc Af Amer: >60 mL/min (ref >60)
GFR calc non Af Amer: >60 mL/min (ref >60)
Glucose, Bld: 111 mg/dL — ABNORMAL HIGH (ref 70–99)
Potassium: 3.5 mmol/L (ref 3.5–5.1)
Sodium: 137 mmol/L (ref 135–145)

## 2020-03-18 LAB — ECHOCARDIOGRAM COMPLETE
Height: 66 in
Weight: 2257.51 oz

## 2020-03-18 LAB — GLUCOSE, CAPILLARY
Glucose-Capillary: 84 mg/dL (ref 70–99)
Glucose-Capillary: 113 mg/dL — ABNORMAL HIGH (ref 70–99)
Glucose-Capillary: 102 mg/dL — ABNORMAL HIGH (ref 70–99)
Glucose-Capillary: 119 mg/dL — ABNORMAL HIGH (ref 70–99)

## 2020-03-18 MED ORDER — SODIUM CHLORIDE 0.9 % IV BOLUS
500.0000 mL | Freq: Once | INTRAVENOUS | Status: AC
  Administered 2020-03-18: 500 mL via INTRAVENOUS

## 2020-03-18 NOTE — Progress Notes (Signed)
I went into start the IV for Pt, daughter wanted the IV team to start the IV. I put order in.  When they went into starts, she wanted them to come back at [facility].

## 2020-03-18 NOTE — Progress Notes (Signed)
Daily Progress Note   Patient Name: Bianca [name]       Date: 03/18/2020 DOB: 09/26/1946  Age: 74 y.o. MRN#: [id number] Attending Physician: Fran Lowes, DO Primary Care Physician: Luciano Cutter, PA-C Admit Date: 03/09/2020  Reason for Consultation/Follow-up: Disposition, Non pain symptom management, Pain control and Psychosocial/spiritual support  Subjective: Patient awake, alert, pleasant confusion. Minimally engages in conversation. Denies current pain or shortness of breath. Urinary retention this afternoon with plans for I/O cath by RN. Oral intake remains poor today.   GOC: Spoke in detail with Dr. Gerri Lins about care plan.   F/u with daughter, Bianca [name] at bedside regarding our conversation yesterday for home with home health and outpatient palliative referral. Explained that outpatient palliative services can transition to home hospice services once wound vac is removed and family is ready. Bianca [name] confirms understanding but patient and daughter minimally engage in conversation this afternoon because they are watching [employer].  Bianca [name] does bring to my attention her concern with [name]'s right leg being more swollen today compared to yesterday. Right inner thigh does appear more swollen than left, warm and hard to touch, and tender to touch. Defer to attending.    Length of Stay: 8  Current Medications: Scheduled Meds:  . acetaminophen  650 mg Oral Q6H  . aspirin  325 mg Oral Daily  . atorvastatin  80 mg Oral Daily  . bisacodyl  10 mg Rectal Daily  . Chlorhexidine Gluconate Cloth  6 each Topical Daily  . docusate sodium  100 mg Oral BID  . dronabinol  2.5 mg Oral BID AC  . enoxaparin (LOVENOX) injection  40 mg Subcutaneous Daily  . feeding supplement (GLUCERNA SHAKE)  237 mL  Oral BID BM  . insulin aspart  0-5 Units Subcutaneous QHS  . insulin aspart  0-9 Units Subcutaneous TID WC  . insulin glargine  10 Units Subcutaneous Daily  . loratadine  10 mg Oral Daily  . LORazepam  0.25 mg Oral Once  . multivitamin with minerals  1 tablet Oral Daily  . neomycin-bacitracin-polymyxin   Topical Daily  . polyethylene glycol  17 g Oral Daily  . Ensure Max Protein  11 oz Oral Daily  . sertraline  25 mg Oral Daily  . sodium chloride flush  3 mL Intravenous Q12H    Continuous Infusions: .  sodium chloride      PRN Meds: hydrALAZINE, ibuprofen, ondansetron **OR** ondansetron (ZOFRAN) IV  Physical Exam Vitals and nursing note reviewed.  Cardiovascular:     Rate and Rhythm: Normal rate.  Pulmonary:     Effort: No respiratory distress.  Skin:    General: Skin is warm and dry.  Neurological:     Mental Status: She is alert.  Psychiatric:        Mood and Affect: Mood normal.        Behavior: Behavior is cooperative.        Cognition and Memory: Cognition is impaired.            Vital Signs: BP 114/65 (BP Location: Left Arm)   Pulse 99   Temp 99.2 F (37.3 C) (Oral)   Resp 20   Ht 5\' 6"  (1.676 m)   Wt 64 kg   SpO2 100%   BMI 22.77 kg/m  SpO2: SpO2: 100 % O2 Device: O2 Device: Room Air O2 Flow Rate:    Intake/output summary:   Intake/Output Summary (Last 24 hours) at 03/18/2020 1421 Last data filed at 03/18/2020 0847 Gross per 24 hour  Intake 120 ml  Output --  Net 120 ml   LBM: Last BM Date: 03/16/20 Baseline Weight: Weight: 64 kg Most recent weight: Weight: 64 kg       Palliative Assessment/Data: PPS 20%    Flowsheet Rows     Most Recent Value  Intake Tab  Referral Department  Hospitalist  Unit at Time of Referral  Med/Surg Unit  Date Notified  03/13/20  Palliative Care Type  New Palliative care  Reason for referral  Clarify Goals of Care  Date of Admission  03/09/20  Date first seen by Palliative Care  03/14/20  # of days Palliative  referral response time  1 Day(s)  # of days IP prior to Palliative referral  4  Clinical Assessment  Psychosocial & Spiritual Assessment  Palliative Care Outcomes      Patient Active Problem List   Diagnosis Date Noted  . Somnolence   . Disturbances of sensation of smell and taste   . Eating disorder   . FTT (failure to thrive) in adult   . Goals of care, counseling/discussion   . Protein-calorie malnutrition, severe 03/13/2020  . Pressure injury of skin 03/12/2020  . Leukocytosis   . Hypotension due to hypovolemia 03/09/2020  . Constipation 03/09/2020  . AKI (acute kidney injury) (HCC) 03/09/2020  . Malnutrition of moderate degree 02/29/2020  . Wound dehiscence 02/25/2020  . S/P CABG (coronary artery bypass graft) 02/08/2020  . Coronary artery disease 02/08/2020  . Chest pain 02/03/2020  . Unstable angina (HCC) 02/03/2020  . Hypokalemia 02/03/2020  . CAD (coronary artery disease) 01/31/2020  . Pain due to onychomycosis of toenails of both feet 01/30/2020  . Chest pain of uncertain etiology 01/29/2020  . Non-STEMI (non-ST elevated myocardial infarction) (HCC) 01/29/2020  . Essential hypertension 01/29/2020  . Hyperlipidemia 01/29/2020  . Acute diastolic CHF (congestive heart failure) (HCC) 01/06/2020  . CHF (congestive heart failure) (HCC) 01/06/2020  . Noncompliance with medication regimen 08/17/2018  . Noncompliance with diagnostic testing 08/17/2018  . Type 2 diabetes mellitus (HCC) 04/17/2008  . TOBACCO ABUSE 04/17/2008    Palliative Care Assessment & Plan   Patient Profile: 74 y.o.femalewith past medical history of CAD s/p CABG recently in 2021, DM, constipation and hypertension,who was admitted on5/30/2021with lethargy.She was hospitalized 5/17 - 5/25 for wound dehiscence and leg swelling  at the harvest site.Since her surgery April 30th the patient has failed to thrive. She has had 5 hospitalizations in 3 months and is no longer eating enough to sustain  herself long term. PMT was consulted for goals of care.  Assessment: Acute kidney injury CVA  Severe protein calorie malnutrition  Constipation Failure to thrive History of recent CABG Recent wound dehiscence   Recommendations/Plan:  Optimize and continue current medical treatments  Continue full code status.   Family's goal is for patient to return home with home health if wound vac is still in place when discharged  Monitor for medically appropriate time to discontinue wound vac - can initiate home hospice per family request once this takes place. Needs ongoing discussions regarding hospice philosophy and code status.   Outpatient palliative referral on discharge. [facility] notified.   Goals of Care and Additional Recommendations:  Limitations on Scope of Treatment: Full Scope Treatment  Code Status:  Full Code  Code Status History    Date Active Date Inactive Code Status Order ID Comments User Context   03/14/2020 1722 03/14/2020 1831 DNR [id number]  Fuller Canada, PA-C Inpatient   03/09/2020 0955 03/14/2020 1722 Full Code [id number]  Karmen Bongo, MD ED   02/25/2020 1403 03/04/2020 2142 Full Code [id number]  Sid Falcon, MD Inpatient   02/03/2020 0149 02/15/2020 1715 Full Code [id number]  Edmonia Lynch, DO ED   01/31/2020 1747 02/01/2020 1849 Full Code [id number]  Lorretta Harp, MD Inpatient   01/06/2020 1410 01/07/2020 1749 Full Code [id number]  Lequita Halt, MD ED   Advance Care Planning Activity    Questions for Most Recent Historical Code Status (Order [id number])    Question Answer Comment   In the event of cardiac or respiratory ARREST Do not call a "code blue"    In the event of cardiac or respiratory ARREST Do not perform Intubation, CPR, defibrillation or ACLS    In the event of cardiac or respiratory ARREST Use medication by any route, position, wound care, and other measures to relive pain and suffering. May use oxygen, suction and manual treatment of  airway obstruction as needed for comfort.       Prognosis:   Unable to determine: poor long-term  Discharge Planning:  Home with Palliative Services  Care plan was discussed with primary RN, Dr Benny Lennert, Bianca [name]/daughter  Thank you for allowing the Palliative Medicine Team to assist in the care of this patient.   Total Time 20 Prolonged Time Billed  no       Greater than 50%  of this time was spent counseling and coordinating care related to the above assessment and plan.  Ihor Dow, DNP, FNP-C Palliative Medicine Team  Phone: [phone] Fax: [phone]  Please contact Palliative Medicine Team phone at [phone] for questions and concerns.

## 2020-03-18 NOTE — Progress Notes (Addendum)
PROGRESS NOTE  Bianca [name] WLN:[id number] DOB: 04/11/1946 DOA: 03/09/2020 PCP: Luciano Cutter, PA-C  Brief History   Bianca [name] a 74 y.o.femalewith medical history significant ofCAD s/p CABG; HTN; and DM presenting with lethargy. She had CABG 1 month ago and was subsequently hospitalized from 5/17-25 for wound dehiscence and leg swelling. She was recently discharged. Yesterday, she got up and and had a sponge bath. She went to the living room to eat and she vomited. They gave her dramamine, Pepto and a protein shak. About 6pm, she was constipated and and needed help with manual disimpaction. She had not voided all day and had small BM. She was weak but able to stand. She tried to eat and vomited again. They went to bed about 10pm and it took 2 hours to get there and she wasn't able to stand and get there. Finally, they decided to call 911. She last ate well Friday AM. She had been doing well when she left the hospital and this was an acute change, other than constipation. She gave her Miralax, pedialyte, and water with some passage of stool.   ED Course:Hypovolemia, decreased PO intake, emesis. Has CABG in March and recent hospitalization for wound dehiscence from harvest site. Wound vac in place and wound looks ok. Hypotension, SBP in 80s. Decreased appetite, not eating well, some emesis, mild abdominal discomfort. CT abdomen unremarkable. BP is fluid responsive.  The patient has been admitted to a telemetry bed. She is receiving IV fluids. She has had BM x 4 since admission. She continues to have poor PO intake of liquids and nutrition. Nutrition has been consulted and calorie count is underway. IV fluids have been discontinued so we may monitor her hydration status better.  Per nutrition the patient's caloric intake will meet only 25% of her nutritional needs. Cessation of IV fluid supplementation has resulted in mild dehydration, but not hypernatremia as per  chemistry drawn on 03/15/2020. Today she has put out 319 cc of urine over 9.5 hours which is minimal output for her weight. I will give her a 500 cc bolus of fluid, but fluid intake remains an issue.  CT head was ordered as it seemed that the patient has had a significant loss of function since her CABG. CT head demonstrated a subacute basal ganglia infarct. MRI revealed subacute/chronic infarcts of the Globus pallidus, right low parietal lobe, and bilateral frontal lobes. The infarcts are described as small. Bilateral carotid dopplers are negative for hemodynamically significant stenosis. Echocardiogram is pending. Neurology consult is pending.  Palliative care has been consulted. Initially it seemed that the patient's family was in favor of DNR and home with hospice. However, in the last couple of days they have backed away from this decision and now seek simply to take the patient home with palliative care.  Consultants  . Wound Care . Palliative Care . Neurology  Procedures  . None  Antibiotics   Anti-infectives (From admission, onward)   None     Subjective  The patient is resting comfortably. No new complaints.  Objective   Vitals:  Vitals:   03/18/20 0410 03/18/20 1403  BP: (!) 144/74 114/65  Pulse: 100 99  Resp:  20  Temp: 99.5 F (37.5 C) 99.2 F (37.3 C)  SpO2: 100% 100%   Exam:  Constitutional:  . The patient is awake, alert, and oriented x 3. No acute distress. Respiratory:  . No increased work of breathing. . No wheezes, rales, or rhonchi . No tactile  fremitus Cardiovascular:  . Regular rate and rhythm . No murmurs, ectopy, or gallups. . No lateral PMI. No thrills. Abdomen:  . Abdomen is soft, non-tender, non-distended . No hernias, masses, or organomegaly . Normoactive bowel sounds.  Musculoskeletal:  . No cyanosis, clubbing, or edema Skin:  . No rashes, lesions, ulcers . palpation of skin: no induration or nodules Neurologic:  . CN 2-12  intact . Sensation all 4 extremities intact Psychiatric:  . Mental status o Mood, affect appropriate o Orientation to person, place, time  . judgment and insight appear intact  I have personally reviewed the following:   Today's Data  . Vitals, Glucoses, BMP pending  Micro Data  . Blood cultures x 2: No growth  Imaging  . CT abdomen and pelvis (03/09/2020) . CT head  Scheduled Meds: . acetaminophen  650 mg Oral Q6H  . aspirin  325 mg Oral Daily  . atorvastatin  80 mg Oral Daily  . bisacodyl  10 mg Rectal Daily  . Chlorhexidine Gluconate Cloth  6 each Topical Daily  . docusate sodium  100 mg Oral BID  . dronabinol  2.5 mg Oral BID AC  . enoxaparin (LOVENOX) injection  40 mg Subcutaneous Daily  . feeding supplement (GLUCERNA SHAKE)  237 mL Oral BID BM  . insulin aspart  0-5 Units Subcutaneous QHS  . insulin aspart  0-9 Units Subcutaneous TID WC  . insulin glargine  10 Units Subcutaneous Daily  . loratadine  10 mg Oral Daily  . LORazepam  0.25 mg Oral Once  . multivitamin with minerals  1 tablet Oral Daily  . neomycin-bacitracin-polymyxin   Topical Daily  . polyethylene glycol  17 g Oral Daily  . Ensure Max Protein  11 oz Oral Daily  . sertraline  25 mg Oral Daily  . sodium chloride flush  3 mL Intravenous Q12H   Continuous Infusions: . sodium chloride      Principal Problem:   Hypotension due to hypovolemia Active Problems:   Type 2 diabetes mellitus (HCC)   Essential hypertension   Hyperlipidemia   CAD (coronary artery disease)   S/P CABG (coronary artery bypass graft)   Wound dehiscence   Constipation   AKI (acute kidney injury) (HCC)   Leukocytosis   Pressure injury of skin   Protein-calorie malnutrition, severe   Eating disorder   FTT (failure to thrive) in adult   Goals of care, counseling/discussion   Somnolence   Disturbances of sensation of smell and taste   LOS: 8 days   A & P  Hypotension: Likely secondary to hypovolemia secondary to poor  oral intake.  Patient with recent prior hospitalization associated with ACS and then harvest wound dehiscence.  Patient noted to have developed constipation which was felt likely causing her to limit her oral intake leading to dehydration and generalized weakness.  Patient noted to have been refusing to take her medications.  Blood pressure improving with hydration.  Infectious work-up negative to date.  Urinalysis unremarkable.  Chest x-ray negative.  Blood cultures ordered however patient refused and blood cultures reordered today.  Per daughter patient now agreeing for oral intake.  Patient noted to be eating approximately 25% of her meals.  Supportive care. Palliative care has bee consulted. Family has requested that psychiatry evaluate the patient for competency to make her own decisions.  Acute kidney injury: Secondary to prerenal azotemia in the setting of poor oral intake, NSAID use, diuretics, ARB.  Patient noted to be hypotensive on admission.  Hypotension responding to IV fluids. Urinalysis nitrite negative, leukocytes negative, 100 protein.  Renal function improving with hydration.  Continue IV fluids.  Supportive care.   CVA: CT of the head demonstrated a subacute infarct of the left basal ganglia. Stroke work up in place. Will consult neurology in the am. The patient is receiving ASA daily. I have added simvastatin pending the result of a lipid panel. MRI revealed subacute/chronic infarcts of the Globus pallidus, right low parietal lobe, and bilateral frontal lobes. The infarcts are described as small. Bilateral carotid dopplers are negative for hemodynamically significant stenosis. Echocardiogram is pending as is neurology consult.  Severe Protein Calorie Malnutrition: Poor PO intake of fluids and nutrition here. Nutrition consult, supplements, and calorie counts are in place. Calorie counts have revealed that the patient is consuming only 25% of her requirements. IV fluids have been  discontinued. Will check BMP in the morning to check on hydration status.  Hypertension: Continue to hold antihypertensive medications secondary to #1.    Constipation: Patient having bowel movement on regimen of MiraLAX daily and Colace.  CT abdomen and pelvis negative for any acute abnormalities.  Continue current bowel regimen. The patient has now had 2 BM's since admission.  Dehydration: IV fluids have been discontinued. Will check BMP in morning to evaluate the patient's ability to maintain her hydration status on her own. She is putting out 32 cc/hr this am. Bolus given. Monitor.  Diabetes mellitus type 2: Hemoglobin A1c 7.1 (02/25/2020).  CBG of 104 this morning.  Patient with poor oral intake.  Change IV fluids back to normal saline as CBGs this afternoon in the 200s.  Continue heart healthy diet.  Supportive care.  Failure to thrive: Patient refusing oral intake.  Patient with poor oral intake at present with hypotension felt secondary to poor p.o. intake.  Patient states no appetite.  CT abdomen and pelvis which was done negative for any acute abnormalities.  Patient with a leukocytosis however afebrile, chest x-ray negative for any acute infiltrates.  Urinalysis is unremarkable.  Blood cultures ordered but patient refused.  Continue IV fluids with normal saline.  Per daughter patient now agreeing to eat and about 25% of her meals. Palliative care has been consulted. Palliative care has raised the possibility that the patient may have suffered a stroke related to her recent ACS and CABG. The patient has suffered small infarcts of the basal ganglia, low right parietal lobe, and bilateral frontal lobes. Neuro consult is pending.  Hypokalemia: Magnesium level at 2.  Potassium at 3.4.  K. Dur 40 mEq p.o. x1. Monitor.  Iron deficiency anemia/anemia of chronic disease: Patient with anemia with hemoglobin of 7.4.  Patient with weakness and hypotension.  Anemia panel with a a iron level of 16,  TIBC of 196.  Follow H&H.  Patient received a dose of IV Feraheme.  Transfusion threshold hemoglobin < 7. Monitor.  Hyperlipidemia: Continue statin.  Coronary artery disease with recent CABG: Continue aspirin. ARB, diuretics on hold due to hypotension.  Outpatient follow-up with cardiology.  Recent wound dehiscence: Wound VAC in place. Patient noted to have ongoing pain.  Patient noted to be taking Tylenol 650 mg every 6 hours standing dose.  Advil discontinued due to acute kidney injury.  Patient noted to have declined other pain medications at this time.  Due to limited mobility PT OT ordered and recommended SNF however patient noted to be refusing SNF and wanted to go home with home health.  Home health therapies have been  ordered.  Patient has been seen by the wound care nurse. Wound vac in place until Friday.  Leukocytosis: Likely reactive leukocytosis.  Patient afebrile.  Urinalysis unremarkable.  Chest x-ray negative for any acute infiltrates.  Patient with no respiratory symptoms.  Blood cultures ordered however patient refused yesterday.  Blood cultures were reordered today.  No need for antibiotics at this time.  I have seen and examined this patient myself. I have spent 34 minutes in her evaluation and care.  DVT prophylaxis: Lovenox Code Status: Full Family Communication: Updated daughter and son at bedside. Disposition:   Status is: Inpatient  Dispo: The patient is from: Home  Anticipated d/c is to: Home with home health therapies and palliative care.  Patient currently with poor oral intake, has been refusing oral intake however started with some oral intake and taking her medications.  Patient presented with constipation. She has had poor intake at home. Nutrition has been consulted. Palliative care has been consulted.  Awaiting neurology consult. Also there is a new complaint of a right thigh swelling, warmth, and tenderness this afternoon. Work up  includes doppler and CT of the femur.  She is not cleared for discharge.  Gunther Zawadzki, DO [facility] Direct contact: see [email or web address]  7PM-7AM contact night coverage as above 03/18/2020, 3:30 PM  LOS: 2 days   ADDENDUM: I have been contacted by palliative care regarding the patient's daughter's concern about the patient's right thigh. Palliative care states that it is swollen warm and tender. I have ordered doppler of the right lower extremity, CT of the right femur, and I have asked nursing to measure the thighs bilaterally.

## 2020-03-18 NOTE — Consult Note (Addendum)
Neurology Consultation  Reason for Consult: Stroke Referring Physician: Dr. Gerri Lins  CC: stroke  History is obtained from:chart  HPI: Bianca [name] is a 74 y.o. female with history of CAD status post CABG, hypertension and diabetes who presented with lethargy to the hospital.  Patient had a CABG 1 month ago and was subsequently hospitalized from 5/17-5/25 for wound dehiscence and leg swelling.  She was recently discharged on 5/29.  Patient was admitted to the hospital on 5/31 secondary to weakness and inability to keep food down.  Upon arriving at the hospital she was found to be hypovolemic, decreased p.o. intake, with emesis.  She was hypotensive with systolic blood pressures in the 80s.  She had been noted to have decreased appetite.  Due to the fact that it appeared that patient had significant loss of function since her CABG a CT of head was ordered which showed a subacute basal ganglia infarct.  Therefore MRI was ordered showing a subacute/chronic infarcts of the globus pallidus, right lower parietal lobe and bilateral frontal lobes.  For this reason neurology was consulted.  Of note palliative care has been working with patient and family.  At this time the plan on their side is optimizing continue current medical treatments.  They want to continue with full CODE STATUS and palliative care.   LKW: No specific date known tpa given?: no, out of window Premorbid modified Rankin scale (mRS): 4 ICH Score: 4   Past Medical History:  Diagnosis Date  . CAD (coronary artery disease) 01/31/2020   CABG  . Diabetes mellitus without complication (HCC)   . Dyspnea   . Hypertension   . Hypertensive urgency 01/06/2020    Family History  Problem Relation Age of Onset  . Diabetes Mother   . Diabetes Sister   . Diabetes Brother   . Heart disease Brother    Social History:   reports that she quit smoking about 2 months ago. Her smoking use included cigarettes. She has a  28.00 pack-year smoking history. She has never used smokeless tobacco. She reports that she does not drink alcohol or use drugs.  Medications  Current Facility-Administered Medications:  .  acetaminophen (TYLENOL) tablet 650 mg, 650 mg, Oral, Q6H, Jonah Blue, MD, 650 mg at 03/18/20 0903 .  aspirin EC tablet 325 mg, 325 mg, Oral, Daily, Jonah Blue, MD, 325 mg at 03/18/20 0902 .  atorvastatin (LIPITOR) tablet 80 mg, 80 mg, Oral, Daily, Jonah Blue, MD, 80 mg at 03/18/20 0902 .  bisacodyl (DULCOLAX) suppository 10 mg, 10 mg, Rectal, Daily, Rodolph Bong, MD, Stopped at 03/13/20 [phone] .  Chlorhexidine Gluconate Cloth 2 % PADS 6 each, 6 each, Topical, Daily, Jonah Blue, MD, 6 each at 03/18/20 1200 .  docusate sodium (COLACE) capsule 100 mg, 100 mg, Oral, BID, Jonah Blue, MD, 100 mg at 03/17/20 2252 .  dronabinol (MARINOL) capsule 2.5 mg, 2.5 mg, Oral, BID AC, Dellinger, Marianne L, PA-C, 2.5 mg at 03/18/20 1309 .  enoxaparin (LOVENOX) injection 40 mg, 40 mg, Subcutaneous, Daily, [place], Yujing Z, RPH, 40 mg at 03/18/20 0903 .  feeding supplement (GLUCERNA SHAKE) (GLUCERNA SHAKE) liquid 237 mL, 237 mL, Oral, BID BM, Swayze, Ava, DO, 237 mL at 03/18/20 0955 .  hydrALAZINE (APRESOLINE) injection 5 mg, 5 mg, Intravenous, Q4H PRN, Jonah Blue, MD .  ibuprofen (ADVIL) tablet 400 mg, 400 mg, Oral, Q6H PRN, Swayze, Ava, DO, 400 mg at 03/14/20 0549 .  insulin  aspart (novoLOG) injection 0-5 Units, 0-5 Units, Subcutaneous, QHS, Karmen Bongo, MD .  insulin aspart (novoLOG) injection 0-9 Units, 0-9 Units, Subcutaneous, TID WC, Karmen Bongo, MD, 2 Units at 03/17/20 1845 .  insulin glargine (LANTUS) injection 10 Units, 10 Units, Subcutaneous, Daily, Karmen Bongo, MD, 10 Units at 03/18/20 [phone] .  loratadine (CLARITIN) tablet 10 mg, 10 mg, Oral, Daily, Karmen Bongo, MD, 10 mg at 03/18/20 0904 .  LORazepam (ATIVAN) tablet 0.25 mg, 0.25 mg, Oral, Once, Dellinger, Bobby Rumpf,  PA-C .  multivitamin with minerals tablet 1 tablet, 1 tablet, Oral, Daily, Swayze, Ava, DO, 1 tablet at 03/18/20 0903 .  neomycin-bacitracin-polymyxin (NEOSPORIN) ointment, , Topical, Daily, Dellinger, Bobby Rumpf, PA-C, Given at 03/18/20 [phone] .  ondansetron (ZOFRAN) tablet 4 mg, 4 mg, Oral, Q6H PRN **OR** ondansetron (ZOFRAN) injection 4 mg, 4 mg, Intravenous, Q6H PRN, Karmen Bongo, MD .  polyethylene glycol (MIRALAX / GLYCOLAX) packet 17 g, 17 g, Oral, Daily, Karmen Bongo, MD, 17 g at 03/14/20 3086 .  protein supplement (ENSURE MAX) liquid, 11 oz, Oral, Daily, Swayze, Ava, DO, 11 oz at 03/18/20 0955 .  sertraline (ZOLOFT) tablet 25 mg, 25 mg, Oral, Daily, Dellinger, Marianne L, PA-C, 25 mg at 03/18/20 0952 .  sodium chloride 0.9 % bolus 500 mL, 500 mL, Intravenous, Once, Swayze, Ava, DO .  sodium chloride flush (NS) 0.9 % injection 3 mL, 3 mL, Intravenous, Q12H, Karmen Bongo, MD, 3 mL at 03/17/20 2253  ROS:  Unable to obtain due to mental status.   Exam: Current vital signs: BP 114/65 (BP Location: Left Arm)   Pulse 99   Temp 99.2 F (37.3 C) (Oral)   Resp 20   Ht 5\' 6"  (1.676 m)   Wt 64 kg   SpO2 100%   BMI 22.77 kg/m  Vital signs in last 24 hours: Temp:  [99.1 F (37.3 C)-99.5 F (37.5 C)] 99.2 F (37.3 C) (06/08 1403) Pulse Rate:  [99-101] 99 (06/08 1403) Resp:  [18-20] 20 (06/08 1403) BP: (114-144)/(64-74) 114/65 (06/08 1403) SpO2:  [99 %-100 %] 100 % (06/08 1403)   Constitutional: Appears frail Psych: flat affect Eyes: No scleral injection HENT: No OP obstrucion Head: Normocephalic.  Cardiovascular: Normal rate and regular rhythm.  Respiratory: Effort normal, non-labored breathing GI: Soft.  No distension. There is no tenderness.  Skin: WDI  Neuro: Mental Status: Patient is awake, alert, not oriented and easily states I do not know when asked questions. Depressed and flat affect.  Cranial Nerves: II: Visual Fields are full.  III,IV, VI: EOMI without  ptosis or diploplia. Pupils equal, round and reactive to light V: Facial sensation is symmetric to temperature VII: Facial movement is symmetric.  VIII: hearing is intact to voice X: Palat elevates symmetrically XI: Shoulder shrug is symmetric. XII: tongue is midline without atrophy or fasciculations.  Motor: 5/5 strength was present in bilateral UE and  Drift positive in bilateral LE Sensory: Sensation is symmetric to light touch and temperature in the arms and legs. Deep Tendon Reflexes: 2+ and symmetric in the biceps and no patella reflex Plantars: Mute bilateral Cerebellar: FNF intact  Labs I have reviewed labs in epic and the results pertinent to this consultation are:  CBC    Component Value Date/Time   WBC 20.4 (H) 03/14/2020 0331   RBC 2.72 (L) 03/14/2020 0331   HGB 7.3 (L) 03/14/2020 0331   HGB 12.7 01/29/2020 1230   HCT 24.0 (L) 03/14/2020 0331   HCT 39.8 01/29/2020 1230  PLT 280 03/14/2020 0331   PLT 209 01/29/2020 1230   MCV 88.2 03/14/2020 0331   MCV 85 01/29/2020 1230   MCH 26.8 03/14/2020 0331   MCHC 30.4 03/14/2020 0331   RDW 17.2 (H) 03/14/2020 0331   RDW 13.6 01/29/2020 1230   LYMPHSABS 1.2 03/14/2020 0331   MONOABS 1.0 03/14/2020 0331   EOSABS 0.1 03/14/2020 0331   BASOSABS 0.0 03/14/2020 0331    CMP     Component Value Date/Time   NA 137 03/18/2020 1543   NA 144 01/29/2020 1230   K 3.5 03/18/2020 1543   CL 105 03/18/2020 1543   CO2 22 03/18/2020 1543   GLUCOSE 111 (H) 03/18/2020 1543   BUN 12 03/18/2020 1543   BUN 14 01/29/2020 1230   CREATININE 0.72 03/18/2020 1543   CALCIUM 7.9 (L) 03/18/2020 1543   PROT 6.0 (L) 03/09/2020 0225   PROT 6.8 02/24/2017 0833   ALBUMIN 2.2 (L) 03/09/2020 0225   ALBUMIN 3.3 (L) 02/24/2017 0833   AST 23 03/09/2020 0225   ALT 17 03/09/2020 0225   ALKPHOS 136 (H) 03/09/2020 0225   BILITOT 0.5 03/09/2020 0225   BILITOT 0.3 02/24/2017 0833   GFRNONAA >60 03/18/2020 1543   GFRAA >60 03/18/2020 1543     Lipid Panel     Component Value Date/Time   CHOL 49 03/16/2020 1938   TRIG 69 03/16/2020 1938   TRIG 165 04/07/2008 0000   HDL 13 (L) 03/16/2020 1938   CHOLHDL 3.8 03/16/2020 1938   VLDL 14 03/16/2020 1938   LDLCALC 22 03/16/2020 1938   LDL 22 A1c was 7.1 22 days ago  Imaging I have reviewed the images obtained:  CT-scan of the brain-left basal ganglia hypodensity  MRI examination of the brain-small diffusion hyperintensities with facilitated appearance on ADC map, seen in the right lobe parietal region and bifrontal white matter.  Similar diffusion signal at the left globus pallidus except for superimposed mineralization.  No truly restricted lesion for acute infarct.  Echocardiogram-shows left ventricular ejection fraction 45 to 50%.  Left ventricle has mildly decreased function.  Moderate left ventricular hypertrophy.  Intra-atrial septum was not well visualized.  Carotid Dopplers-bilateral 1-39% stenosis with antegrade flow in the vertebral arteries  Felicie Morn PA-C [facility] [phone]  M-F  (9:00 am- 5:00 PM)  03/18/2020, 4:57 PM      NEUROHOSPITALIST ADDENDUM Performed a face to face diagnostic evaluation.   74 year old female with coronary artery disease status post CABG, diabetes mellitus, hypertension, admitted on 517 with food making sense and leg swelling at the harvest site admitted on 530/2021 with lethargy, failure to thrive.  Patient also noted to be hypotensive with mild abdominal discomfort.  A CT head was ordered due to significant loss of function which showed a subacute left basal ganglia infarct.   I reviewed the MRI brain-shows a late subacute infarct in the left basal ganglia, and a smaller right parietal white matter infarct that subacute. Distribution of infarct not typical for cardioembolic, likely small vessel disease.  On examination, patient does appear to be weaker in the right lower extremity compared to the left.  Exam  was limited due to poor cooperation-patient is oriented to herself and the fact that she is in the hospital.  Speech is slow and patient appears to poor effort bilateral.  Bilateral upper extremity strength 4+ /5 and right lower extremity 3 /5 -left lower extremity 4+/5.  Unclear if the right lower extremity weakness is from her stroke versus just  poor effort due to swelling.   Recommendations Continue aspirin 325 mg and atorvastatin 80 mg Work-up included carotid Dopplers which showed no significant stenosis and echocardiogram which showed slightly low EF, with no LV thrombus. Continue PT OT, speech therapy for swallow evaluation       Ravan Schlemmer MD [facility] [phone]   If 7pm to 7am, please call on call as listed on AMION.

## 2020-03-18 NOTE — Plan of Care (Signed)

## 2020-03-18 NOTE — Plan of Care (Signed)
  Problem: Education: Goal: Knowledge of General Education information will improve Description Including pain rating scale, medication(s)/side effects and non-pharmacologic comfort measures Outcome: Progressing   

## 2020-03-18 NOTE — Progress Notes (Signed)
Occupational Therapy Treatment Patient Details Name: Bianca [name] MRN: [id number] DOB: [birth year] Today's Date: 03/18/2020    History of present illness 74 y.o. female with medical history significant of CAD s/p CABG; HTN; and DM presenting with lethargy.  She had CABG 1 month ago and was subsequently hospitalized from 5/17-25 for wound dehiscence and leg swelling. Pt with recent history of vomiting, limited P.O. intake, and weakness. CT head/MRI [place] revealing subacute CVA in b/l cerebral white matter and L globus pallidus.   OT comments  Pt progressing to EOB standing and taking a few steps, but unable to transfer to recliner as pt abruptly wanting to sit down stating "I can't move the right leg. I am going to fall."  Pt's arms positioned at chest and pt requiring constant cueing to reduce push/pull and keep hands from going down to sides. Pt with decreased attentiveness today and unable to continue to follow simple commands; pt with decreased STM and unable to recall precautions or abide by them without max cues. Unsafe to perform squat pivot transfers as pt not following commands and unable to assist with power up and hand positioning. Pt would be best in post acute care setting. OT following.    Follow Up Recommendations  Home health OT;Supervision/Assistance - 24 hour;SNF(family refusing SNF)    Equipment Recommendations  Wheelchair (measurements OT);Wheelchair cushion (measurements OT);Hospital bed;Other (comment)    Recommendations for Other Services      Precautions / Restrictions Precautions Precautions: Fall;Sternal Precaution Booklet Issued: No Precaution Comments: RLE wound vac, sternal cues Restrictions Weight Bearing Restrictions: Yes Other Position/Activity Restrictions: sternal precautions       Mobility Bed Mobility Overal bed mobility: Needs Assistance Bed Mobility: Supine to Sit;Sit to Supine     Supine to sit: Max assist;+2 for physical assistance Sit to  supine: Max assist;+2 for physical assistance;+2 for safety/equipment   General bed mobility comments: Pt maxA +2 for hellicopter method to EOB and reverse for sit to supine. Pt grimmacing in pain due to RLE.  Transfers Overall transfer level: Needs assistance Equipment used: 2 person hand held assist Transfers: Sit to/from Stand Sit to Stand: Max assist;+2 physical assistance;+2 safety/equipment;From elevated surface         General transfer comment: Pt able to stand after 3rd attempt, but with posterior lean and inability to slide RLE more than 1 step.    Balance Overall balance assessment: Needs assistance Sitting-balance support: Bilateral upper extremity supported;Feet supported Sitting balance-Leahy Scale: Poor Sitting balance - Comments: minguardA to minA overall for dynamic sitting balance Postural control: Posterior lean Standing balance support: Bilateral upper extremity supported Standing balance-Leahy Scale: Poor Standing balance comment: modA +2 for stating standing; pt unable to continue to take >2 steps total so pt returned to supine.  x3 standing attempts prior to returning to supine.                           ADL either performed or assessed with clinical judgement   ADL Overall ADL's : Needs assistance/impaired                                     Functional mobility during ADLs: Maximal assistance;Total assistance;+2 for physical assistance;+2 for safety/equipment;Cueing for sequencing;Cueing for safety(unable to get OOB) General ADL Comments: Pt with decreased attentiveness today and unable to continue to follow simple commands; pt with decreased  STM and unable to recall precautions or abide by them without max cues. Unsafe to perform transfers as pt not following commands and unable to assist with power up     Vision       Perception     Praxis      Cognition Arousal/Alertness: Awake/alert Behavior During Therapy: Flat  affect Overall Cognitive Status: Impaired/Different from baseline Area of Impairment: Awareness;Safety/judgement;Following commands;Attention;Problem solving;Memory                 Orientation Level: Place;Time;Situation Current Attention Level: Selective Memory: Decreased recall of precautions;Decreased short-term memory Following Commands: Follows one step commands inconsistently;Follows one step commands with increased time Safety/Judgement: Decreased awareness of safety;Decreased awareness of deficits Awareness: Intellectual Problem Solving: Slow processing;Requires verbal cues;Requires tactile cues;Decreased initiation;Difficulty sequencing General Comments: Pt picking at linens, gait belt and extremely distacted. Pt's arms positioned at chest and pt requiring constant cueing to reduce push/pull and keep hands from going down to sides.        Exercises     Shoulder Instructions       General Comments Pt's daughter in room and hopeful of pt to increase ability to get OOB.    Pertinent Vitals/ Pain       Pain Assessment: Faces Faces Pain Scale: Hurts even more Pain Location: RLE with movement/wt bear Pain Descriptors / Indicators: Grimacing;Guarding Pain Intervention(s): Monitored during session;Repositioned  Home Living                                          Prior Functioning/Environment              Frequency  Min 2X/week        Progress Toward Goals  OT Goals(current goals can now be found in the care plan section)  Progress towards OT goals: Progressing toward goals  Acute Rehab OT Goals Patient Stated Goal: family wants pt to get OOB to get closer to going home OT Goal Formulation: With patient/family Time For Goal Achievement: 03/24/20 Potential to Achieve Goals: Good ADL Goals Pt Will Perform Grooming: with set-up;with supervision;sitting Pt Will Perform Upper Body Bathing: with supervision;with set-up;sitting Pt Will  Perform Upper Body Dressing: with set-up;with supervision;sitting Pt Will Transfer to Toilet: with min assist;bedside commode;stand pivot transfer Additional ADL Goal #1: Pt will follow 3/4 2 step commands during ADL tasks with minimal redirectional cues  Plan Discharge plan remains appropriate;Frequency remains appropriate    Co-evaluation                 AM-PAC OT "6 Clicks" Daily Activity     Outcome Measure   Help from another person eating meals?: A Little Help from another person taking care of personal grooming?: A Little Help from another person toileting, which includes using toliet, bedpan, or urinal?: Total Help from another person bathing (including washing, rinsing, drying)?: A Lot Help from another person to put on and taking off regular upper body clothing?: A Lot Help from another person to put on and taking off regular lower body clothing?: Total 6 Click Score: 12    End of Session Equipment Utilized During Treatment: Gait belt;Rolling walker  OT Visit Diagnosis: Unsteadiness on feet (R26.81);Muscle weakness (generalized) (M62.81);Other symptoms and signs involving cognitive function;Pain Pain - Right/Left: Right Pain - part of body: Leg   Activity Tolerance Patient limited by pain;Treatment limited secondary to medical complications (Comment)  Patient Left in bed;with call bell/phone within reach;with bed alarm set;with family/visitor present   Nurse Communication          Time: 5947-0761 OT Time Calculation (min): 29 min  Charges: OT General Charges $OT Visit: 1 Visit OT Treatments $Self Care/Home Management : 8-22 mins $Therapeutic Activity: 8-22 mins  Flora Lipps, OTR/L Acute Rehabilitation Services Pager: [phone] Office: [phone]    Samanth Mirkin C 03/18/2020, 4:35 PM

## 2020-03-18 NOTE — Progress Notes (Signed)
  Echocardiogram 2D Echocardiogram has been performed.  Burnard Hawthorne 03/18/2020, 9:52 AM

## 2020-03-18 NOTE — Progress Notes (Signed)
Nutrition Follow-up  RD working remotely.  DOCUMENTATION CODES:   Severe malnutrition in context of acute illness/injury  INTERVENTION:   -Continue Magic cup TID with meals, each supplement provides 290 kcal and 9 grams of protein -Continue Ensure Max po daily, each supplement provides 150 kcal and 30 grams of protein.  -Continue Glucerna Shake po BID, each supplement provides 220 kcal and 10 grams of protein -Continue liberalized diet of regular  NUTRITION DIAGNOSIS:   Severe Malnutrition related to acute illness(CAD s/p CABG) as evidenced by energy intake < or equal to 75% for > or equal to 1 month, mild fat depletion, moderate fat depletion, moderate muscle depletion, mild muscle depletion, percent weight loss.  Ongoing  GOAL:   Patient will meet greater than or equal to 90% of their needs  Progressing  MONITOR:   PO intake, Supplement acceptance, Diet advancement, Labs, Weight trends, Skin, I & O's  REASON FOR ASSESSMENT:   Consult Assessment of nutrition requirement/status  ASSESSMENT:   TWILA RAPPA is a 74 y.o. female with medical history significant of CAD s/p CABG; HTN; and DM presenting with lethargy.  She had CABG 1 month ago and was subsequently hospitalized from 5/17-25 for wound dehiscence and leg swelling.  6/4- diet liberalized to regular 6/5- marinol initiated  Reviewed I/O's: +440 ml x 24 hours and +8.5 L since admission  Per psychiatry notes, pt determined to be unable to make medical decisions for herself. Pt's son and daughter have been making decisions on her behalf. Palliative care team continues to follow; pt family leaning towards taking pt home with hospice.   Pt continues to eat very minimally. Noted meal completion records indicate pt is only consuming 15-20% of meals. Pt is accepting Glucerna and Ensure Max supplements, per MAR. Per palliative care notes and discussion with MD, family does not desire PEG or temporary feeding tube  placement and is accepting of comfort feeding. Calorie count completed on 03/14/20; pt was meeting less than 25% of estimated calorie and protein needs.   Labs reviewed: CBGS: 84-177 (inpatient orders for glycemic control are 0-5 units insulin aspart q HS, 0-9 units insulin aspart TID with meals, and 10 units insulin glargine daily).   Diet Order:   Diet Order            Diet regular Room service appropriate? Yes; Fluid consistency: Thin  Diet effective now              EDUCATION NEEDS:   Education needs have been addressed  Skin:  Skin Assessment: Skin Integrity Issues: Skin Integrity Issues:: Incisions, Stage II, Wound VAC Stage II: coccyx Wound Vac: rt pretibial Incisions: chest  Last BM:  03/12/20  Height:   Ht Readings from Last 1 Encounters:  03/09/20 5\' 6"  (1.676 m)    Weight:   Wt Readings from Last 1 Encounters:  03/09/20 64 kg    Ideal Body Weight:  59.1 kg  BMI:  Body mass index is 22.77 kg/m.  Estimated Nutritional Needs:   Kcal:  2000-2200  Protein:  110-125 grams  Fluid:  > 2 L    03/11/20, RD, LDN, CDCES Registered Dietitian II Certified Diabetes Care and Education Specialist Please refer to [facility] for RD and/or RD on-call/weekend/after hours pager

## 2020-03-19 ENCOUNTER — Inpatient Hospital Stay (HOSPITAL_COMMUNITY): Payer: Medicare Other

## 2020-03-19 DIAGNOSIS — R5381 Other malaise: Secondary | ICD-10-CM

## 2020-03-19 DIAGNOSIS — M7989 Other specified soft tissue disorders: Secondary | ICD-10-CM

## 2020-03-19 DIAGNOSIS — E876 Hypokalemia: Secondary | ICD-10-CM

## 2020-03-19 DIAGNOSIS — D72825 Bandemia: Secondary | ICD-10-CM

## 2020-03-19 LAB — GLUCOSE, CAPILLARY
Glucose-Capillary: 101 mg/dL — ABNORMAL HIGH (ref 70–99)
Glucose-Capillary: 142 mg/dL — ABNORMAL HIGH (ref 70–99)
Glucose-Capillary: 67 mg/dL — ABNORMAL LOW (ref 70–99)
Glucose-Capillary: 76 mg/dL (ref 70–99)
Glucose-Capillary: 104 mg/dL — ABNORMAL HIGH (ref 70–99)

## 2020-03-19 LAB — MAGNESIUM: Magnesium: 1.9 mg/dL (ref 1.7–2.4)

## 2020-03-19 LAB — HEMOGLOBIN A1C
Hgb A1c MFr Bld: 7.7 % — ABNORMAL HIGH (ref 4.8–5.6)
Mean Plasma Glucose: 174.29 mg/dL

## 2020-03-19 LAB — BRAIN NATRIURETIC PEPTIDE: B Natriuretic Peptide: 645.2 pg/mL — ABNORMAL HIGH (ref 0.0–100.0)

## 2020-03-19 LAB — PHOSPHORUS: Phosphorus: 3.3 mg/dL (ref 2.5–4.6)

## 2020-03-19 MED ORDER — CLOPIDOGREL BISULFATE 75 MG PO TABS
75.0000 mg | ORAL_TABLET | Freq: Every day | ORAL | Status: DC
  Administered 2020-03-19 – 2020-03-25 (×7): 75 mg via ORAL
  Filled 2020-03-19 (×7): qty 1

## 2020-03-19 MED ORDER — VANCOMYCIN HCL 1500 MG/300ML IV SOLN
1500.0000 mg | Freq: Once | INTRAVENOUS | Status: AC
  Administered 2020-03-19: 1500 mg via INTRAVENOUS
  Filled 2020-03-19: qty 300

## 2020-03-19 MED ORDER — IOHEXOL 350 MG/ML SOLN
75.0000 mL | Freq: Once | INTRAVENOUS | Status: AC | PRN
  Administered 2020-03-19: 75 mL via INTRAVENOUS

## 2020-03-19 MED ORDER — ASPIRIN EC 81 MG PO TBEC
81.0000 mg | DELAYED_RELEASE_TABLET | Freq: Every day | ORAL | Status: DC
  Administered 2020-03-20 – 2020-03-23 (×4): 81 mg via ORAL
  Filled 2020-03-19 (×6): qty 1

## 2020-03-19 MED ORDER — VANCOMYCIN HCL 750 MG/150ML IV SOLN
750.0000 mg | Freq: Two times a day (BID) | INTRAVENOUS | Status: DC
  Administered 2020-03-20 – 2020-03-21 (×4): 750 mg via INTRAVENOUS
  Filled 2020-03-19 (×6): qty 150

## 2020-03-19 NOTE — Progress Notes (Signed)
Md made aware of patients chest wound .

## 2020-03-19 NOTE — Progress Notes (Addendum)
Left thigh circumference just 4 inches above knee = 20.5 inches, warm Right thigh circumference just 4 inches above knee = 19 inches, warm and tender  Per dayshift RN, patient had a large incontinent episode at 1700 where the whole linen had to be changed.

## 2020-03-19 NOTE — Progress Notes (Signed)
PROGRESS NOTE  SILVER ACHEY GBT:[id number] DOB: 31-Jan-1946   PCP: Luciano Cutter, PA-C  Patient is from: Home  DOA: 03/09/2020 LOS: 9  Brief Narrative / Interim history: 74 year old female with history of CAD/CABG on 02/08/2020, DM-2, HTN and recent hospitalization from 5/17-5/25 for wound dehiscence and leg swelling presenting with poor p.o. intake and emesis, and admitted for hypotension, AKI and FTT/severe protein calorie malnutrition.  She was started on IV fluid.  Patient had CT head and MRI.  That revealed multiple subacute/chronic infarcts including globus pallidus, right parietal lobe and bilateral frontal lobes.  Bilateral CUS without significant finding.  Echo with mildly reduced EF.  Neurology consulted.  CTA head and neck ordered.  Palliative medicine consulted.  Remains full code with full scope of care with palliative care follow-up on discharge.  Subjective: Seen and examined late this afternoon.  She was down imaging for CTA head and neck this morning.  No complaints at this time.  She denies pain, shortness of breath, focal neuro symptoms, GI or UTI symptoms.  She is awake and oriented x3 (self, place, date and family) but does not have great insight into her condition.  Patient son at bedside.  Objective: Vitals:   03/18/20 1403 03/18/20 2030 03/18/20 2115 03/19/20 0507  BP: 114/65  107/74 136/63  Pulse: 99 100 (!) 101 100  Resp: 20  18 20   Temp: 99.2 F (37.3 C)  99.3 F (37.4 C) 99.2 F (37.3 C)  TempSrc: Oral  Oral Oral  SpO2: 100%  97% 99%  Weight:      Height:        Intake/Output Summary (Last 24 hours) at 03/19/2020 1443 Last data filed at 03/19/2020 0755 Gross per 24 hour  Intake 616 ml  Output 0 ml  Net 616 ml   Filed Weights   03/09/20 0209  Weight: 64 kg    Examination:  GENERAL: Frail.  Chronically ill-appearing.  No apparent distress.  Nontoxic. HEENT: MMM.  Vision and hearing grossly intact.  NECK: Supple.  No apparent JVD.    RESP: On room air.  No IWOB.  Fair aeration bilaterally. CVS:  RRR. Heart sounds normal.  ABD/GI/GU: BS+. Abd soft, NTND.  MSK/EXT:  Moves extremities. No apparent deformity.  1+ pitting edema bilaterally.  Wound VAC noted. SKIN: Sternotomy wound with small purulent drainage.  See picture below for more. NEURO: Awake, alert and oriented x3.  No apparent focal neuro deficit. PSYCH: Calm.  No distress or agitation.  Poor insight.      Procedures:  None  Microbiology summarized: COVID-19 PCR negative. Blood cultures negative.  Assessment & Plan: Hypotension due to dehydration in the setting of poor p.o. intake which continues to be an issue.  Echo with EF of 45 to 50%, indeterminate diastolic function and no other significant finding.  Has leukocytosis but no obvious source of infection. -Continue holding home antihypertensive medications -Check TSH and cortisol levels  AKI/prerenal azotemia: Due to hypotension with concurrent NSAID use, diuretics, ARB. -Continue IV fluid  Subacute/chronic CVA in multiple territories: Likely ischemic insult in the setting of hypotension and atherosclerotic changes.  CTA head and neck without flow obstructing stenosis. -Neurology following-plan for DAPT for 3 weeks followed by single agent -Continue statin.  CAD/CABG on 02/08/2020 complicated by wound dehiscence and delayed healing-concern about some purulent drainage from sternotomy wound site. -Start IV vancomycin -We will discuss with CTS in the morning -continue aspirin. ARB, diuretics on hold due to hypotension.  -Continue statin and  Plavix.  Uncontrolled DM-2 with hyperglycemia: A1c 7.1% on 02/25/2020 Recent Labs    03/18/20 2114 03/19/20 0753 03/19/20 1154  GLUCAP 119* 101* 142*  -Continue current regimen -Liberate diet?  Hypokalemia: -Recheck a replenish as appropriate  Iron deficiency anemia/anemia of chronic disease: Baseline Hgb 7-8>> 7.3.  Iron sat 8%.  TIBC 196  (low). -Received IVF Feraheme -Monitor H&H-relatively stable  Hyperlipidemia: LDL down from 152-22 in late in 3 months. -Continue statin.  Recent wound dehiscence: SVG site -Continue wound VAC  Leukocytosis/bandemia: Concerned about her sternotomy wound. -Antibiotics as above -Will involve CTS.  Constipation:  -Scheduled MiraLAX and Colace daily  Multiple thyroid nodules -Nonemergent thyroid ultrasound for further evaluation  Debility/generalized weakness/physical deconditioning-multifactorial including CAD, hypertension, FTT and anemia -PT/OT  Goal of care-significant comorbidities as above.  Poor prognosis, likely little 6 months.  Still full code which would pose more harm than benefit given his situation.  Family not interested in further discussion about CODE STATUS -Appreciate palliative medicine help  Severe Protein Calorie Malnutrition/failure to thrive: Poor p.o. intake, only meeting 25% of her requirements. Nutrition Problem: Severe Malnutrition Etiology: acute illness(CAD s/p CABG) Signs/Symptoms: energy intake < or equal to 75% for > or equal to 1 month, mild fat depletion, moderate fat depletion, moderate muscle depletion, mild muscle depletion, percent weight loss Percent weight loss: 19.9 % Interventions: Glucerna shake, MVI, Magic cup, Premier Protein Pressure Injury 03/12/20 Coccyx Mid Stage 2 -  Partial thickness loss of dermis presenting as a shallow open injury with a red, pink wound bed without slough. (Active)  03/12/20 0930  Location: Coccyx  Location Orientation: Mid  Staging: Stage 2 -  Partial thickness loss of dermis presenting as a shallow open injury with a red, pink wound bed without slough.  Wound Description (Comments):   Present on Admission:    DVT prophylaxis: Subcu Lovenox Code Status: Full code Family Communication: Updated patient's son at bedside. Status is: Inpatient  Remains inpatient appropriate because:Hemodynamically unstable  and Inpatient level of care appropriate due to severity of illness   Dispo: The patient is from: Home              Anticipated d/c is to: Home              Anticipated d/c date is: 3 days              Patient currently is not medically stable to d/c.       Consultants:  Neurology Palliative medicine   Sch Meds:  Scheduled Meds: . acetaminophen  650 mg Oral Q6H  . [START ON 03/20/2020] aspirin EC  81 mg Oral Daily  . atorvastatin  80 mg Oral Daily  . bisacodyl  10 mg Rectal Daily  . Chlorhexidine Gluconate Cloth  6 each Topical Daily  . clopidogrel  75 mg Oral Daily  . docusate sodium  100 mg Oral BID  . dronabinol  2.5 mg Oral BID AC  . enoxaparin (LOVENOX) injection  40 mg Subcutaneous Daily  . feeding supplement (GLUCERNA SHAKE)  237 mL Oral BID BM  . insulin aspart  0-5 Units Subcutaneous QHS  . insulin aspart  0-9 Units Subcutaneous TID WC  . insulin glargine  10 Units Subcutaneous Daily  . loratadine  10 mg Oral Daily  . LORazepam  0.25 mg Oral Once  . multivitamin with minerals  1 tablet Oral Daily  . neomycin-bacitracin-polymyxin   Topical Daily  . polyethylene glycol  17 g Oral Daily  .  Ensure Max Protein  11 oz Oral Daily  . sertraline  25 mg Oral Daily  . sodium chloride flush  3 mL Intravenous Q12H   Continuous Infusions: PRN Meds:.hydrALAZINE, ondansetron **OR** ondansetron (ZOFRAN) IV  Antimicrobials: Anti-infectives (From admission, onward)   None       I have personally reviewed the following labs and images: CBC: Recent Labs  Lab 03/14/20 0331 03/18/20 1740  WBC 20.4* 18.4*  NEUTROABS 17.1* 15.9*  HGB 7.3* 7.3*  HCT 24.0* 24.3*  MCV 88.2 88.0  PLT 280 349   BMP &GFR Recent Labs  Lab 03/15/20 0108 03/18/20 1543 03/19/20 1020  NA 140 137  --   K 3.8 3.5  --   CL 109 105  --   CO2 22 22  --   GLUCOSE 149* 111*  --   BUN 21 12  --   CREATININE 0.76 0.72  --   CALCIUM 8.2* 7.9*  --   MG  --   --  1.9  PHOS  --   --  3.3    Estimated Creatinine Clearance: 56.9 mL/min (by C-G formula based on SCr of 0.72 mg/dL). Liver & Pancreas: No results for input(s): AST, ALT, ALKPHOS, BILITOT, PROT, ALBUMIN in the last 168 hours. No results for input(s): LIPASE, AMYLASE in the last 168 hours. No results for input(s): AMMONIA in the last 168 hours. Diabetic: Recent Labs    03/18/20 1740  HGBA1C 7.7*   Recent Labs  Lab 03/18/20 1143 03/18/20 1639 03/18/20 2114 03/19/20 0753 03/19/20 1154  GLUCAP 113* 102* 119* 101* 142*   Cardiac Enzymes: No results for input(s): CKTOTAL, CKMB, CKMBINDEX, TROPONINI in the last 168 hours. No results for input(s): PROBNP in the last 8760 hours. Coagulation Profile: No results for input(s): INR, PROTIME in the last 168 hours. Thyroid Function Tests: No results for input(s): TSH, T4TOTAL, FREET4, T3FREE, THYROIDAB in the last 72 hours. Lipid Profile: Recent Labs    03/16/20 1938  CHOL 49  HDL 13*  LDLCALC 22  TRIG 69  CHOLHDL 3.8   Anemia Panel: No results for input(s): VITAMINB12, FOLATE, FERRITIN, TIBC, IRON, RETICCTPCT in the last 72 hours. Urine analysis:    Component Value Date/Time   COLORURINE YELLOW 03/09/2020 0635   APPEARANCEUR HAZY (A) 03/09/2020 0635   LABSPEC 1.020 03/09/2020 0635   PHURINE 5.0 03/09/2020 0635   GLUCOSEU NEGATIVE 03/09/2020 0635   HGBUR NEGATIVE 03/09/2020 [place] 03/09/2020 0635   BILIRUBINUR neg 02/20/2015 1040   KETONESUR NEGATIVE 03/09/2020 0635   PROTEINUR 100 (A) 03/09/2020 0635   UROBILINOGEN 0.2 02/20/2015 1040   UROBILINOGEN 1.0 02/04/2015 1350   NITRITE NEGATIVE 03/09/2020 0635   LEUKOCYTESUR NEGATIVE 03/09/2020 0635   Sepsis Labs: Invalid input(s): PROCALCITONIN, [place]  Microbiology: Recent Results (from the past 240 hour(s))  SARS Coronavirus 2 by RT PCR (hospital order, performed in [facility] hospital lab) Nasopharyngeal Nasopharyngeal Swab     Status: None   Collection Time: 03/10/20   8:48 AM   Specimen: Nasopharyngeal Swab  Result Value Ref Range Status   SARS Coronavirus 2 NEGATIVE NEGATIVE Final    Comment: (NOTE) SARS-CoV-2 target nucleic acids are NOT DETECTED. The SARS-CoV-2 RNA is generally detectable in upper and lower respiratory specimens during the acute phase of infection. The lowest concentration of SARS-CoV-2 viral copies this assay can detect is 250 copies / mL. A negative result does not preclude SARS-CoV-2 infection and should not be used as the sole basis for treatment  or other patient management decisions.  A negative result may occur with improper specimen collection / handling, submission of specimen other than nasopharyngeal swab, presence of viral mutation(s) within the areas targeted by this assay, and inadequate number of viral copies (<250 copies / mL). A negative result must be combined with clinical observations, patient history, and epidemiological information. Fact Sheet for Patients:   BoilerBrush.com.cy Fact Sheet for Healthcare Providers: https://pope.com/ This test is not yet approved or cleared  by the [place] FDA and has been authorized for detection and/or diagnosis of SARS-CoV-2 by FDA under an Emergency Use Authorization (EUA).  This EUA will remain in effect (meaning this test can be used) for the duration of the COVID-19 declaration under Section 564(b)(1) of the Act, 21 U.S.C. section 360bbb-3(b)(1), unless the authorization is terminated or revoked sooner. Performed at [facility], [address] [place], [place] [address]   Culture, blood (Routine X 2) w Reflex to ID Panel     Status: None   Collection Time: 03/11/20  9:00 AM   Specimen: BLOOD  Result Value Ref Range Status   Specimen Description BLOOD RIGHT ANTECUBITAL  Final   Special Requests   Final    BOTTLES DRAWN AEROBIC AND ANAEROBIC Blood Culture adequate volume   Culture   Final    NO GROWTH 5  DAYS Performed at [facility], [address], [place], [place] [address]    Report Status 03/16/2020 FINAL  Final  Culture, blood (Routine X 2) w Reflex to ID Panel     Status: None   Collection Time: 03/11/20  9:20 AM   Specimen: BLOOD RIGHT ARM  Result Value Ref Range Status   Specimen Description BLOOD RIGHT ARM  Final   Special Requests   Final    BOTTLES DRAWN AEROBIC ONLY Blood Culture adequate volume   Culture   Final    NO GROWTH 5 DAYS Performed at [facility], [address], [place], [place] [address]    Report Status 03/16/2020 FINAL  Final    Radiology Studies: CT ANGIO HEAD W OR WO CONTRAST  Result Date: 03/19/2020 CLINICAL DATA:  Stroke follow-up EXAM: CT ANGIOGRAPHY HEAD AND NECK TECHNIQUE: Multidetector CT imaging of the head and neck was performed using the standard protocol during bolus administration of intravenous contrast. Multiplanar CT image reconstructions and MIPs were obtained to evaluate the vascular anatomy. Carotid stenosis measurements (when applicable) are obtained utilizing NASCET criteria, using the distal internal carotid diameter as the denominator. CONTRAST:  75mL OMNIPAQUE IOHEXOL 350 MG/ML SOLN COMPARISON:  MRI of the brain March 17, 2020 FINDINGS: CT HEAD FINDINGS Brain: No evidence of acute infarction, hemorrhage, hydrocephalus, extra-axial collection or mass lesion/mass effect. Hypodense focus within the genu of the left internal capsule/lentiform nucleus, corresponding to subacute to chronic infarct seen on prior MRI. Vascular: Calcified plaques are seen in the bilateral carotid siphons. Skull: Normal. Negative for fracture or focal lesion. Sinuses: Imaged portions are clear. Orbits: No acute finding. Review of the MIP images confirms the above findings CTA NECK FINDINGS Aortic arch: Common origin of the left common carotid and innominate artery from the aortic arch. The left vertebral artery originates directly from the aortic arch, just  proximal to the origin of the left subclavian artery. Postsurgical changes from triple cardiac bypass. The imaged portion of the aorta shows no evidence of aneurysm or dissection. Calcified plaques are seen in the aortic arch. No significant stenosis of the major arch  vessel origins. Right carotid system: Calcified plaque is seen in the right carotid bulb without hemodynamically significant stenosis. Left carotid system: Mixed density plaque with tiny ulceration (series 12, image 115) in the left carotid bifurcation without hemodynamically significant stenosis. Vertebral arteries: The dominant left vertebral artery originates directly from the aortic arch (anatomical variant) with normal caliber throughout the cervical segment. The right vertebral artery is diminutive at its origin and along the V1 segment. Multifocal areas of stenosis in ectasias are seen along the V2 and V3 segments. Skeleton: Degenerative changes of the cervical spine noted. No focal aggressive lesion identified. Other neck: Multiple thyroid nodules the largest in the right lobe with heterogeneous density and peripheral coarse calcification measuring 1.5 cm. Upper chest: Left pleural effusion. Review of the MIP images confirms the above findings CTA HEAD FINDINGS Anterior circulation: No significant stenosis, proximal occlusion, aneurysm, or vascular malformation. However, evaluation of the intracranial ICA and proximal MCA limited by motion. Posterior circulation: No significant stenosis, proximal occlusion, aneurysm, or vascular malformation. Venous sinuses: As permitted by contrast timing, patent. Review of the MIP images confirms the above findings IMPRESSION: 1. Hypodense focus within the genu of the left internal capsule/lentiform nucleus, corresponding to subacute to chronic infarct seen on prior MRI. 2. Mild atherosclerotic disease in the left carotid bulb with tiny ulceration. No hemodynamically significant stenosis in the neck. 3. No  large vessel occlusion. 4. Multifocal areas of stenosis along the cervical right vertebral artery with dominant left vertebral artery. 5. Multiple thyroid nodules the largest in the right lobe with heterogeneous density and peripheral coarse calcification measuring 1.5 cm. Recommend nonemergent thyroid ultrasound for further evaluation. 6. Left pleural effusion. Aortic Atherosclerosis (ICD10-I70.0). Electronically Signed   By: Baldemar Lenis M.D.   On: 03/19/2020 13:30   CT ANGIO NECK W OR WO CONTRAST  Result Date: 03/19/2020 CLINICAL DATA:  Stroke follow-up EXAM: CT ANGIOGRAPHY HEAD AND NECK TECHNIQUE: Multidetector CT imaging of the head and neck was performed using the standard protocol during bolus administration of intravenous contrast. Multiplanar CT image reconstructions and MIPs were obtained to evaluate the vascular anatomy. Carotid stenosis measurements (when applicable) are obtained utilizing NASCET criteria, using the distal internal carotid diameter as the denominator. CONTRAST:  31mL OMNIPAQUE IOHEXOL 350 MG/ML SOLN COMPARISON:  MRI of the brain March 17, 2020 FINDINGS: CT HEAD FINDINGS Brain: No evidence of acute infarction, hemorrhage, hydrocephalus, extra-axial collection or mass lesion/mass effect. Hypodense focus within the genu of the left internal capsule/lentiform nucleus, corresponding to subacute to chronic infarct seen on prior MRI. Vascular: Calcified plaques are seen in the bilateral carotid siphons. Skull: Normal. Negative for fracture or focal lesion. Sinuses: Imaged portions are clear. Orbits: No acute finding. Review of the MIP images confirms the above findings CTA NECK FINDINGS Aortic arch: Common origin of the left common carotid and innominate artery from the aortic arch. The left vertebral artery originates directly from the aortic arch, just proximal to the origin of the left subclavian artery. Postsurgical changes from triple cardiac bypass. The imaged portion of  the aorta shows no evidence of aneurysm or dissection. Calcified plaques are seen in the aortic arch. No significant stenosis of the major arch vessel origins. Right carotid system: Calcified plaque is seen in the right carotid bulb without hemodynamically significant stenosis. Left carotid system: Mixed density plaque with tiny ulceration (series 12, image 115) in the left carotid bifurcation without hemodynamically significant stenosis. Vertebral arteries: The dominant left vertebral artery originates directly from the  aortic arch (anatomical variant) with normal caliber throughout the cervical segment. The right vertebral artery is diminutive at its origin and along the V1 segment. Multifocal areas of stenosis in ectasias are seen along the V2 and V3 segments. Skeleton: Degenerative changes of the cervical spine noted. No focal aggressive lesion identified. Other neck: Multiple thyroid nodules the largest in the right lobe with heterogeneous density and peripheral coarse calcification measuring 1.5 cm. Upper chest: Left pleural effusion. Review of the MIP images confirms the above findings CTA HEAD FINDINGS Anterior circulation: No significant stenosis, proximal occlusion, aneurysm, or vascular malformation. However, evaluation of the intracranial ICA and proximal MCA limited by motion. Posterior circulation: No significant stenosis, proximal occlusion, aneurysm, or vascular malformation. Venous sinuses: As permitted by contrast timing, patent. Review of the MIP images confirms the above findings IMPRESSION: 1. Hypodense focus within the genu of the left internal capsule/lentiform nucleus, corresponding to subacute to chronic infarct seen on prior MRI. 2. Mild atherosclerotic disease in the left carotid bulb with tiny ulceration. No hemodynamically significant stenosis in the neck. 3. No large vessel occlusion. 4. Multifocal areas of stenosis along the cervical right vertebral artery with dominant left vertebral  artery. 5. Multiple thyroid nodules the largest in the right lobe with heterogeneous density and peripheral coarse calcification measuring 1.5 cm. Recommend nonemergent thyroid ultrasound for further evaluation. 6. Left pleural effusion. Aortic Atherosclerosis (ICD10-I70.0). Electronically Signed   By: Baldemar Lenis M.D.   On: 03/19/2020 13:30      Zamirah Denny T. Tywanda [facility]  If 7PM-7AM, please contact night-coverage [email or web address] Password [facility] 03/19/2020, 2:43 PM

## 2020-03-19 NOTE — Progress Notes (Signed)
STROKE TEAM PROGRESS NOTE   INTERVAL HISTORY I personally reviewed history of presenting illness with the patient, electronic medical records and imaging films in PACS.  She was recently discharged with a wound infection and was readmitted with vomiting and hypotension and MRI scan shows left basal ganglia and right parietal white matter lacunar infarcts.  CT angiogram showed no significant large vessel intracranial or extracranial stenosis.  Echocardiogram shows slightly diminished ejection fraction of 40 to 45%.  No clot.  LDL cholesterol is 22 mg percent and hemoglobin A1c 7.2.  Vitals:   03/18/20 1403 03/18/20 2030 03/18/20 2115 03/19/20 0507  BP: 114/65  107/74 136/63  Pulse: 99 100 (!) 101 100  Resp: 20  18 20   Temp: 99.2 F (37.3 C)  99.3 F (37.4 C) 99.2 F (37.3 C)  TempSrc: Oral  Oral Oral  SpO2: 100%  97% 99%  Weight:      Height:        CBC:  Recent Labs  Lab 03/14/20 0331 03/18/20 1740  WBC 20.4* 18.4*  NEUTROABS 17.1* 15.9*  HGB 7.3* 7.3*  HCT 24.0* 24.3*  MCV 88.2 88.0  PLT 280 349    Basic Metabolic Panel:  Recent Labs  Lab 03/15/20 0108 03/18/20 1543  NA 140 137  K 3.8 3.5  CL 109 105  CO2 22 22  GLUCOSE 149* 111*  BUN 21 12  CREATININE 0.76 0.72  CALCIUM 8.2* 7.9*   Lipid Panel:     Component Value Date/Time   CHOL 49 03/16/2020 1938   TRIG 69 03/16/2020 1938   TRIG 165 04/07/2008 0000   HDL 13 (L) 03/16/2020 1938   CHOLHDL 3.8 03/16/2020 1938   VLDL 14 03/16/2020 1938   LDLCALC 22 03/16/2020 1938   HgbA1c:  Lab Results  Component Value Date   HGBA1C 7.7 (H) 03/18/2020   Urine Drug Screen: No results found for: LABOPIA, COCAINSCRNUR, LABBENZ, AMPHETMU, THCU, LABBARB  Alcohol Level No results found for: ETH  IMAGING past 24 hours No results found.  PHYSICAL EXAM Frail elderly lady not in distress. . Afebrile. Head is nontraumatic. Neck is supple without bruit.    Cardiac exam no murmur or gallop. Lungs are clear to auscultation.  Distal pulses are well felt. Neurological Exam :  She is awake alert oriented to person and place only.  Diminished attention, registration and recall.  Follows only simple midline and one-step commands.  Extraocular movements are full range.  Voice is soft and bratty phrenic with increased reaction time.  No dysarthria.  Mild right lower facial weakness when she smiles.  Tongue midline.  Motor system exam symmetric upper and lower extremity strength but poor effort on lower extremity exam bilaterally.  Tone is normal.  Sensation normal bilaterally.  Gait not tested. ASSESSMENT/PLAN Bianca [name] is a 74 y.o. female with history of CAD status post CABG, hypertension and diabetes admitted 03/09/20 with FTT following recent hospitalization 5/17-5/25 for wound dehiscence and leg swelling following CABG, who is a DNR and followed by palliative care. CT done d/t loss of function since CABG showed subacute basal ganglia infarct.   Stroke:   Multiple bilateral lacunar infarcts of various ages secondary to small vessel disease in the setting of hypotension and dehydration  CT head 6/5 L basal ganglia hyypodensity  MRI  Small subacute to chronic B cerebral white matter and L globus pallidus infarcts  CTA head & neck hypodense genu L internal capsule. L ICA mild atherosclerosis w/ tiny ulceration.  No LVO. Cervical R VA and L VA w/ multifocal stenoses. Multiple thyroid nodules. L pleural effusion   Carotid Doppler  B ICA 1-39% stenosis, VAs antegrade   2D Echo EF 45-50%. No source of embolus   LDL 22  HgbA1c 7.7  Lovenox 40 mg sq daily for VTE prophylaxis  aspirin 325 mg daily prior to admission, now on aspirin 325 mg daily. Recommend DAPT x 3 weeks then plavix alone. Orders adjusted   Therapy recommendations:  PT recommends SNF, HH OT  Disposition:  pending   Follow up stroke clinic in 4 weeks, depending on long-term plan of care.  Hypertension  Stable . BP goal normotensive from  stroke standpoint  Hyperlipidemia  Home meds:  lipitor 80, resumed in hospital  LDL 22, goal < 70  Continue statin at discharge  Diabetes type II Uncontrolled  HgbA1c 7.7, goal < 7.0  Other Stroke Risk Factors  Advanced age  Cigarette smoker, quit 2 mos ago, advised to continue to stop smoking  Coronary artery disease, s/p CABG 02/08/20 w/ recent wound dehehiscence   Other Active Problems  AKI  Severe protein calorie malnutrition  Constipation  dehydration  FTT  Hypokalemia  Fe deficiency anemia/anemia of chronic disease  Leukocytosis   Hospital day # 9 She presented with generalized weakness and altered mental status in the setting of dehydration and hypotension with a recent infected leg wound and treatment.  Recommend cautious hydration as well as dual antiplatelet therapy with aspirin 81 mg and Plavix 75 mg daily for 3 weeks followed by Plavix alone.  Continue ongoing stroke work-up.  Risk factor aggressive modification.  Discussed with patient and Dr. Cyndia Skeeters.  Greater than 50% time during this 35-minute visit was spent in counseling and coordination of care about her lacunar strokes and discussion about stroke prevention and treatment and answering questions Antony Contras, MD To contact Stroke Continuity provider, please refer to [email or web address] After hours, contact General Neurology

## 2020-03-19 NOTE — Progress Notes (Addendum)
Pharmacy Antibiotic Note  Bianca [name] is a 74 y.o. female admitted on 03/09/2020 with hypotension, AKI  (due to hypotension and dehydration with poor oral intake, concurrent NSAID/diuretic/ARB use) and FTT/severe protein calorie malnutrition.  Pt had CABG on 02/08/20, complicated by wound dehiscence and delayed healing; team now concerned re: purulent drainage from sternotomy wound site (pt was recently hospitalized from 5/17-5/25 for wound dehiscence and leg swelling). Pharmacy has been consulted for vancomycin dosing for wound infection of sternal wound. Pt has rec'd no antibiotics thus far during this admission.  WBC 18.4 (6/8), Tmax 99.3 F, Scr 0.72, CrCl 63.4 ml/min (renal function improving; down from 1.68 on admission)  Plan: Vancomycin 1500 mg IV X 1, followed by vancomycin 750 mg IV Q 12 hrs, per  nomogram  (goal trough: 15-20 mg/L) Monitor WBC, temp, clinical improvement, renal function (will monitor daily Scr until renal function stable), vancomycin levels as indicated  Height: 5\' 6"  (167.6 cm) Weight: 76.3 kg (168 lb 3.4 oz)(wrong weight first entered) IBW/kg (Calculated) : 59.3  Temp (24hrs), Avg:99.2 F (37.3 C), Min:99.1 F (37.3 C), Max:99.3 F (37.4 C)  Recent Labs  Lab 03/14/20 0331 03/15/20 0108 03/18/20 1543 03/18/20 1740  WBC 20.4*  --   --  18.4*  CREATININE  --  0.76 0.72  --     Estimated Creatinine Clearance: 56.9 mL/min (by C-G formula based on SCr of 0.72 mg/dL).    Allergies  Allergen Reactions  . Tramadol Nausea And Vomiting    Microbiology results: 6/1 BCx X 2: NG/final 5/30 UCx: NG/final  5/31 COVID:  negative 4/29 MRSA PCR: negative  Thank you for allowing pharmacy to be a part of this patient's care.  5/29, PharmD, BCPS, [facility] Clinical Pharmacist 03/19/2020 6:01 PM

## 2020-03-19 NOTE — Progress Notes (Signed)
Right lower extremity venous duplex has been completed. Preliminary results can be found in CV Proc through chart review.   03/19/20 1:55 PM Olen Cordial RVT

## 2020-03-20 LAB — RENAL FUNCTION PANEL
Albumin: 1.5 g/dL — ABNORMAL LOW (ref 3.5–5.0)
Anion gap: 7 (ref 5–15)
BUN: 10 mg/dL (ref 8–23)
CO2: 25 mmol/L (ref 22–32)
Calcium: 7.8 mg/dL — ABNORMAL LOW (ref 8.9–10.3)
Chloride: 103 mmol/L (ref 98–111)
Creatinine, Ser: 0.66 mg/dL (ref 0.44–1.00)
GFR calc Af Amer: >60 mL/min (ref >60)
GFR calc non Af Amer: >60 mL/min (ref >60)
Glucose, Bld: 63 mg/dL — ABNORMAL LOW (ref 70–99)
Phosphorus: 3.1 mg/dL (ref 2.5–4.6)
Potassium: 3.6 mmol/L (ref 3.5–5.1)
Sodium: 135 mmol/L (ref 135–145)

## 2020-03-20 LAB — CBC WITH DIFFERENTIAL/PLATELET
Abs Immature Granulocytes: 0.16 10*3/uL — ABNORMAL HIGH (ref 0.00–0.07)
Basophils Absolute: 0 10*3/uL (ref 0.0–0.1)
Basophils Relative: 0 %
Eosinophils Absolute: 0.1 10*3/uL (ref 0.0–0.5)
Eosinophils Relative: 1 %
HCT: 23.3 % — ABNORMAL LOW (ref 36.0–46.0)
Hemoglobin: 7.1 g/dL — ABNORMAL LOW (ref 12.0–15.0)
Immature Granulocytes: 1 %
Lymphocytes Relative: 9 %
Lymphs Abs: 1.3 10*3/uL (ref 0.7–4.0)
MCH: 26.6 pg (ref 26.0–34.0)
MCHC: 30.5 g/dL (ref 30.0–36.0)
MCV: 87.3 fL (ref 80.0–100.0)
Monocytes Absolute: 0.9 10*3/uL (ref 0.1–1.0)
Monocytes Relative: 6 %
Neutro Abs: 12.7 10*3/uL — ABNORMAL HIGH (ref 1.7–7.7)
Neutrophils Relative %: 83 %
Platelets: 390 10*3/uL (ref 150–400)
RBC: 2.67 MIL/uL — ABNORMAL LOW (ref 3.87–5.11)
RDW: 17.6 % — ABNORMAL HIGH (ref 11.5–15.5)
WBC: 15.2 10*3/uL — ABNORMAL HIGH (ref 4.0–10.5)
nRBC: 0 % (ref 0.0–0.2)

## 2020-03-20 LAB — CREATININE, SERUM
Creatinine, Ser: 0.68 mg/dL (ref 0.44–1.00)
GFR calc Af Amer: >60 mL/min (ref >60)
GFR calc non Af Amer: >60 mL/min (ref >60)

## 2020-03-20 LAB — GLUCOSE, CAPILLARY
Glucose-Capillary: 54 mg/dL — ABNORMAL LOW (ref 70–99)
Glucose-Capillary: 78 mg/dL (ref 70–99)
Glucose-Capillary: 121 mg/dL — ABNORMAL HIGH (ref 70–99)
Glucose-Capillary: 87 mg/dL (ref 70–99)
Glucose-Capillary: 82 mg/dL (ref 70–99)

## 2020-03-20 LAB — PREPARE RBC (CROSSMATCH)

## 2020-03-20 LAB — MRSA PCR SCREENING: MRSA by PCR: NEGATIVE

## 2020-03-20 LAB — TSH: TSH: 0.578 u[IU]/mL (ref 0.350–4.500)

## 2020-03-20 LAB — MAGNESIUM: Magnesium: 2 mg/dL (ref 1.7–2.4)

## 2020-03-20 LAB — CORTISOL-AM, BLOOD: Cortisol - AM: 24 ug/dL — ABNORMAL HIGH (ref 6.7–22.6)

## 2020-03-20 MED ORDER — FUROSEMIDE 10 MG/ML IJ SOLN
20.0000 mg | Freq: Once | INTRAMUSCULAR | Status: AC
  Administered 2020-03-20: 20 mg via INTRAVENOUS
  Filled 2020-03-20: qty 2

## 2020-03-20 MED ORDER — SODIUM CHLORIDE 0.9% IV SOLUTION: Freq: Once | INTRAVENOUS | Status: AC

## 2020-03-20 MED ORDER — DEXTROSE-NACL 5-0.9 % IV SOLN: INTRAVENOUS | Status: AC

## 2020-03-20 NOTE — Progress Notes (Signed)
PROGRESS NOTE  Bianca [name] WYO:[id number] DOB: 1946-04-03   PCP: Luciano Cutter, PA-C  Patient is from: Home  DOA: 03/09/2020 LOS: 10  Brief Narrative / Interim history: 74 year old female with history of CAD/CABG on 02/08/2020, DM-2, HTN and recent hospitalization from 5/17-5/25 for wound dehiscence and leg swelling presenting with poor p.o. intake and emesis, and admitted for hypotension, AKI and FTT/severe protein calorie malnutrition.  She was started on IV fluid.  Patient had CT head and MRI.  That revealed multiple subacute/chronic infarcts including globus pallidus, right parietal lobe and bilateral frontal lobes.  Bilateral CUS without significant finding.  Echo with mildly reduced EF.  Neurology consulted.  CTA head and neck without flow obstructing stenosis.  Patient was noted to have some purulent discharge from sternotomy wound site.  Started on IV vancomycin.  Palliative medicine consulted.  Remains full code with full scope of care with palliative care follow-up on discharge.  Subjective: Seen and examined earlier this morning.  No major events overnight of this morning.  No complaints but not a great historian.  She responds no to pain, shortness of breath, GI or UTI symptoms.  Patient's daughter at bedside.  Objective: Vitals:   03/20/20 0403 03/20/20 1332 03/20/20 1458 03/20/20 1500  BP: (!) 150/60 134/60 (!) 155/72 (!) 145/63  Pulse: 98 92 98 98  Resp: 18 18 18 18   Temp: 99.6 F (37.6 C) 98.6 F (37 C) 99.7 F (37.6 C) 99.9 F (37.7 C)  TempSrc: Oral Oral Oral Oral  SpO2: 100% 100% 96% 97%  Weight:      Height:        Intake/Output Summary (Last 24 hours) at 03/20/2020 1513 Last data filed at 03/20/2020 0900 Gross per 24 hour  Intake 480 ml  Output 0 ml  Net 480 ml   Filed Weights   03/09/20 0209 03/19/20 1821  Weight: 64 kg 76.3 kg    Examination:  GENERAL: Frail and chronically ill-appearing.  No apparent distress.  Nontoxic. HEENT: MMM.   Vision and hearing grossly intact.  NECK: Supple.  No apparent JVD.  RESP: On room air.  No IWOB.  Fair aeration bilaterally. CVS:  RRR. Heart sounds normal.  ABD/GI/GU: BS+. Abd soft, NTND.  MSK/EXT: 2+ pitting edema bilaterally.  Sternotomy wound with some discharge on dressing. SKIN: Wound VAC over right lower extremity NEURO: Awake and oriented x3 (self, partial place and person).  No apparent focal neuro deficit. PSYCH: Calm.  No distress or agitation.      Procedures:  None  Microbiology summarized: COVID-19 PCR negative. Blood cultures negative.  Assessment & Plan: Hypotension due to dehydration in the setting of poor p.o. intake which continues to be an issue.  Echo with EF of 45 to 50%, indeterminate diastolic function and no other significant finding.  Has leukocytosis but no obvious source of infection other than some purulent discharge from his sternotomy wound.  TSH and cortisol level within normal. -Continue holding home antihypertensive medications  AKI/prerenal azotemia: Due to hypotension with concurrent NSAID use, diuretics, ARB.  Resolved. -Continue gentle IV fluid  Subacute/chronic CVA in multiple territories: Likely ischemic insult in the setting of hypotension and atherosclerotic changes.  CTA head and neck without flow obstructing stenosis. -Neurology following-plan for DAPT for 3 weeks followed by single agent -Continue statin.  CAD/CABG on 02/08/2020 complicated by wound dehiscence and delayed healing-concern about some purulent drainage from sternotomy wound site. -Continue IV vancomycin. TCTS consulted -continue aspirin. ARB, diuretics on hold due to  hypotension.  -Continue statin and Plavix.  Uncontrolled DM-2 with hypoglycemia: A1c 7.1% on 02/25/2020 Recent Labs    03/20/20 0742 03/20/20 0814 03/20/20 1140  GLUCAP 54* 78 121*  -Discontinue Lantus -Continue sliding scale -D5 solution for about 8 hours  Hypokalemia: Resolved. -Recheck and  replenish as appropriate  Iron deficiency anemia/anemia of chronic disease: Baseline Hgb 7-8>> 7.3>7.1.  Iron sat 8%.  TIBC 196 (low). -Transfuse 1 unit -Received IVF Feraheme -Monitor H&H-relatively stable  Hyperlipidemia: LDL down from 152-22 in late in 3 months. -Continue statin.  Recent wound dehiscence: SVG site -Continue wound VAC  Leukocytosis/bandemia: Concerned about her sternotomy wound.  Improved. -Antibiotics as above  Constipation:  -Scheduled MiraLAX and Colace daily  Multiple thyroid nodules -Nonemergent thyroid ultrasound for further evaluation  Debility/generalized weakness/physical deconditioning-multifactorial including CAD, hypertension, FTT and anemia -PT/OT  Goal of care-significant comorbidities as above.  Poor prognosis, likely little 6 months.  Still full code which would pose more harm than benefit given his situation.   -Appreciate palliative medicine help  Severe Protein Calorie Malnutrition/failure to thrive: Poor p.o. intake, only meeting 25% of her requirements.  Significant hypoalbuminemia. Nutrition Problem: Severe Malnutrition Etiology: acute illness (CAD s/p CABG) Signs/Symptoms: energy intake < or equal to 75% for > or equal to 1 month, mild fat depletion, moderate fat depletion, moderate muscle depletion, mild muscle depletion, percent weight loss Percent weight loss: 19.9 % Interventions: Glucerna shake, MVI, Magic cup, Premier Protein Pressure Injury 03/12/20 Coccyx Mid Stage 2 -  Partial thickness loss of dermis presenting as a shallow open injury with a red, pink wound bed without slough. (Active)  03/12/20 0930  Location: Coccyx  Location Orientation: Mid  Staging: Stage 2 -  Partial thickness loss of dermis presenting as a shallow open injury with a red, pink wound bed without slough.  Wound Description (Comments):   Present on Admission:    DVT prophylaxis: Subcu Lovenox Code Status: Full code Family Communication: Updated  patient's daughter at bedside. Status is: Inpatient  Remains inpatient appropriate because:Hemodynamically unstable, IV treatments appropriate due to intensity of illness or inability to take PO and Inpatient level of care appropriate due to severity of illness   Dispo: The patient is from: Home              Anticipated d/c is to: Home              Anticipated d/c date is: 3 days              Patient currently is not medically stable to d/c.       Consultants:  Neurology Palliative medicine   Sch Meds:  Scheduled Meds: . acetaminophen  650 mg Oral Q6H  . aspirin EC  81 mg Oral Daily  . atorvastatin  80 mg Oral Daily  . bisacodyl  10 mg Rectal Daily  . Chlorhexidine Gluconate Cloth  6 each Topical Daily  . clopidogrel  75 mg Oral Daily  . docusate sodium  100 mg Oral BID  . dronabinol  2.5 mg Oral BID AC  . enoxaparin (LOVENOX) injection  40 mg Subcutaneous Daily  . feeding supplement (GLUCERNA SHAKE)  237 mL Oral BID BM  . insulin aspart  0-5 Units Subcutaneous QHS  . insulin aspart  0-9 Units Subcutaneous TID WC  . loratadine  10 mg Oral Daily  . LORazepam  0.25 mg Oral Once  . multivitamin with minerals  1 tablet Oral Daily  . neomycin-bacitracin-polymyxin   Topical Daily  .  polyethylene glycol  17 g Oral Daily  . Ensure Max Protein  11 oz Oral Daily  . sertraline  25 mg Oral Daily  . sodium chloride flush  3 mL Intravenous Q12H   Continuous Infusions: . dextrose 5 % and 0.9% NaCl 60 mL/hr at 03/20/20 1146  . vancomycin 750 mg (03/20/20 0749)   PRN Meds:.hydrALAZINE, ondansetron **OR** ondansetron (ZOFRAN) IV  Antimicrobials: Anti-infectives (From admission, onward)   Start     Dose/Rate Route Frequency Ordered Stop   03/20/20 0830  vancomycin (VANCOREADY) IVPB 750 mg/150 mL     Discontinue     750 mg 150 mL/hr over 60 Minutes Intravenous Every 12 hours 03/19/20 1848     03/19/20 1915  vancomycin (VANCOREADY) IVPB 1500 mg/300 mL        1,500 mg 150 mL/hr  over 120 Minutes Intravenous  Once 03/19/20 1848 03/19/20 2255       I have personally reviewed the following labs and images: CBC: Recent Labs  Lab 03/14/20 0331 03/18/20 1740 03/20/20 0226  WBC 20.4* 18.4* 15.2*  NEUTROABS 17.1* 15.9* 12.7*  HGB 7.3* 7.3* 7.1*  HCT 24.0* 24.3* 23.3*  MCV 88.2 88.0 87.3  PLT 280 349 390   BMP &GFR Recent Labs  Lab 03/15/20 0108 03/18/20 1543 03/19/20 1020 03/20/20 0226  NA 140 137  --  135  K 3.8 3.5  --  3.6  CL 109 105  --  103  CO2 22 22  --  25  GLUCOSE 149* 111*  --  63*  BUN 21 12  --  10  CREATININE 0.76 0.72  --  0.66  0.68  CALCIUM 8.2* 7.9*  --  7.8*  MG  --   --  1.9 2.0  PHOS  --   --  3.3 3.1   Estimated Creatinine Clearance: 63.4 mL/min (by C-G formula based on SCr of 0.68 mg/dL). Liver & Pancreas: Recent Labs  Lab 03/20/20 0226  ALBUMIN 1.5*   No results for input(s): LIPASE, AMYLASE in the last 168 hours. No results for input(s): AMMONIA in the last 168 hours. Diabetic: Recent Labs    03/18/20 1740  HGBA1C 7.7*   Recent Labs  Lab 03/19/20 1843 03/19/20 2057 03/20/20 0742 03/20/20 0814 03/20/20 1140  GLUCAP 76 104* 54* 78 121*   Cardiac Enzymes: No results for input(s): CKTOTAL, CKMB, CKMBINDEX, TROPONINI in the last 168 hours. No results for input(s): PROBNP in the last 8760 hours. Coagulation Profile: No results for input(s): INR, PROTIME in the last 168 hours. Thyroid Function Tests: Recent Labs    03/20/20 0226  TSH 0.578   Lipid Profile: No results for input(s): CHOL, HDL, LDLCALC, TRIG, CHOLHDL, LDLDIRECT in the last 72 hours. Anemia Panel: No results for input(s): VITAMINB12, FOLATE, FERRITIN, TIBC, IRON, RETICCTPCT in the last 72 hours. Urine analysis:    Component Value Date/Time   COLORURINE YELLOW 03/09/2020 0635   APPEARANCEUR HAZY (A) 03/09/2020 0635   LABSPEC 1.020 03/09/2020 0635   PHURINE 5.0 03/09/2020 0635   GLUCOSEU NEGATIVE 03/09/2020 0635   HGBUR NEGATIVE  03/09/2020 [place] 03/09/2020 0635   BILIRUBINUR neg 02/20/2015 1040   KETONESUR NEGATIVE 03/09/2020 0635   PROTEINUR 100 (A) 03/09/2020 0635   UROBILINOGEN 0.2 02/20/2015 1040   UROBILINOGEN 1.0 02/04/2015 1350   NITRITE NEGATIVE 03/09/2020 0635   LEUKOCYTESUR NEGATIVE 03/09/2020 0635   Sepsis Labs: Invalid input(s): PROCALCITONIN, [place]  Microbiology: Recent Results (from the past 240 hour(s))  Culture, blood (  Routine X 2) w Reflex to ID Panel     Status: None   Collection Time: 03/11/20  9:00 AM   Specimen: BLOOD  Result Value Ref Range Status   Specimen Description BLOOD RIGHT ANTECUBITAL  Final   Special Requests   Final    BOTTLES DRAWN AEROBIC AND ANAEROBIC Blood Culture adequate volume   Culture   Final    NO GROWTH 5 DAYS Performed at [facility], [address], [place], [place] [address]    Report Status 03/16/2020 FINAL  Final  Culture, blood (Routine X 2) w Reflex to ID Panel     Status: None   Collection Time: 03/11/20  9:20 AM   Specimen: BLOOD RIGHT ARM  Result Value Ref Range Status   Specimen Description BLOOD RIGHT ARM  Final   Special Requests   Final    BOTTLES DRAWN AEROBIC ONLY Blood Culture adequate volume   Culture   Final    NO GROWTH 5 DAYS Performed at [facility], [address] [place], [place] [address]    Report Status 03/16/2020 FINAL  Final  MRSA PCR Screening     Status: None   Collection Time: 03/19/20 11:08 PM   Specimen: Nasal Mucosa; Nasopharyngeal  Result Value Ref Range Status   MRSA by PCR NEGATIVE NEGATIVE Final    Comment:        The GeneXpert MRSA Assay (FDA approved for NASAL specimens only), is one component of a comprehensive MRSA colonization surveillance program. It is not intended to diagnose MRSA infection nor to guide or monitor treatment for MRSA infections. Performed at [facility], [address], [place], [place] [address]     Radiology Studies: No  results found.    Mell Mellott T. Stanislaw Acton [facility]  If 7PM-7AM, please contact night-coverage [email or web address] Password [facility] 03/20/2020, 3:13 PM

## 2020-03-20 NOTE — Progress Notes (Signed)
Occupational Therapy Treatment Patient Details Name: Bianca [name] MRN: [id number] DOB: [birth year] Today's Date: 03/20/2020    History of present illness 74 y.o. female with medical history significant of CAD s/p CABG; HTN; and DM presenting with lethargy.  She had CABG 1 month ago and was subsequently hospitalized from 5/17-25 for wound dehiscence and leg swelling. Pt with recent history of vomiting, limited P.O. intake, and weakness. CT head/MRI [facility] revealing subacute CVA in b/l cerebral white matter and L globus pallidus.   OT comments  Patient continues to make progress towards goals in skilled OT session. Patient's session encompassed co-treat in beginning of session to aid with bed mobility and transfers, and finished in chair with completion of basic ADL tasks. Cognition remains limiting factor in session, as pt often requires multi-modal cues to adhere to one step commands, and is often unable to switch attention due to pain. Patient noted to require step by step instructions to complete ADL tasks (oral care in particular) and not understanding that she needed to stay OOB for at least an hour once sitting for a few moments. Therapist continuing to advocate for SNF placement due to multi-system involvement and decreased caregiver burden due to current level of function; will continue to follow acutely.    Follow Up Recommendations  Home health OT;Supervision/Assistance - 24 hour;SNF (family refusing SNF)    Equipment Recommendations  Wheelchair (measurements OT);Wheelchair cushion (measurements OT);Hospital bed;Other (comment)    Recommendations for Other Services      Precautions / Restrictions Precautions Precautions: Fall;Sternal Precaution Booklet Issued: No Precaution Comments: RLE wound vac, sternal cues Restrictions Weight Bearing Restrictions: Yes RUE Weight Bearing: Partial weight bearing LUE Weight Bearing: Partial weight bearing Other Position/Activity Restrictions:  sternal precautions       Mobility Bed Mobility Overal bed mobility: Needs Assistance Bed Mobility: Supine to Sit     Supine to sit: Mod assist;+2 for physical assistance     General bed mobility comments: Pt with need for max verbal cues for preparatory exercises for BUEs, however pt with increased ability to complete bed mobility to simple one step commands  Transfers Overall transfer level: Needs assistance Equipment used: Rolling walker (2 wheeled) Transfers: Sit to/from Stand Sit to Stand: Max assist;+2 physical assistance;+2 safety/equipment;From elevated surface Stand pivot transfers: Max assist       General transfer comment: Heavy posterior lean noted on first trial, however able to complete stand pivot with max A on second attempt    Balance Overall balance assessment: Needs assistance Sitting-balance support: Bilateral upper extremity supported;Feet supported Sitting balance-Leahy Scale: Fair     Standing balance support: Bilateral upper extremity supported Standing balance-Leahy Scale: Poor Standing balance comment: heavy posterior lean noted in standing, cognition remains barrier as pt is unable to heed cues to correct standing posture                           ADL either performed or assessed with clinical judgement   ADL Overall ADL's : Needs assistance/impaired     Grooming: Wash/dry hands;Wash/dry face;Oral care;Brushing hair;Moderate assistance;Minimal assistance;Cueing for sequencing Grooming Details (indicate cue type and reason): Step by step cues needed for oral care to complete appropriately                             Functional mobility during ADLs: Maximal assistance;+2 for physical assistance;+2 for safety/equipment;Cueing for sequencing;Cueing for safety General ADL  Comments: Pt with increased participation in session, however deficits in STM and following commands evident     Vision       Perception     Praxis       Cognition Arousal/Alertness: Awake/alert Behavior During Therapy: Flat affect Overall Cognitive Status: Impaired/Different from baseline Area of Impairment: Awareness;Safety/judgement;Following commands;Attention;Problem solving;Memory                   Current Attention Level: Selective Memory: Decreased recall of precautions;Decreased short-term memory Following Commands: Follows one step commands inconsistently;Follows one step commands with increased time Safety/Judgement: Decreased awareness of safety;Decreased awareness of deficits Awareness: Intellectual Problem Solving: Slow processing;Requires verbal cues;Requires tactile cues;Decreased initiation;Difficulty sequencing General Comments: Pt continues to pick at linens in session, requiring cues to reorient, and max verbal cues to complete oral care in appropriate sequence, pt unable to grasp concept of sitting in the chair or reclining to elevate legs, insisting on standing to "strech leg out" increased pain noted, and pt finally agreeable to legs elevated after attempted sit<>stand        Exercises     Shoulder Instructions       General Comments      Pertinent Vitals/ Pain       Pain Assessment: Faces Faces Pain Scale: Hurts even more Pain Location: RLE with movement/wt bear Pain Descriptors / Indicators: Grimacing;Guarding Pain Intervention(s): Limited activity within patient's tolerance;Monitored during session;Repositioned  Home Living                                          Prior Functioning/Environment              Frequency  Min 2X/week        Progress Toward Goals  OT Goals(current goals can now be found in the care plan section)  Progress towards OT goals: Progressing toward goals  Acute Rehab OT Goals Patient Stated Goal: to get the pain to stop OT Goal Formulation: With patient/family Time For Goal Achievement: 03/24/20 Potential to Achieve Goals: Fair   Plan Discharge plan remains appropriate;Frequency remains appropriate    Co-evaluation                 AM-PAC OT "6 Clicks" Daily Activity     Outcome Measure   Help from another person eating meals?: A Little Help from another person taking care of personal grooming?: A Little Help from another person toileting, which includes using toliet, bedpan, or urinal?: Total Help from another person bathing (including washing, rinsing, drying)?: A Lot Help from another person to put on and taking off regular upper body clothing?: A Lot Help from another person to put on and taking off regular lower body clothing?: Total 6 Click Score: 12    End of Session Equipment Utilized During Treatment: Gait belt;Rolling walker  OT Visit Diagnosis: Unsteadiness on feet (R26.81);Muscle weakness (generalized) (M62.81);Other symptoms and signs involving cognitive function;Pain Pain - Right/Left: Right Pain - part of body: Leg   Activity Tolerance Patient tolerated treatment well;Patient limited by fatigue   Patient Left in bed;with call bell/phone within reach;with bed alarm set;with family/visitor present   Nurse Communication Mobility status        Time: 1113-1140 OT Time Calculation (min): 27 min  Charges: OT Treatments $Self Care/Home Management : 8-22 mins  Pollyann Glen E. Amaris Delafuente, COTA/L Acute [employer] [phone]   [facility]  Candyce Churn 03/20/2020, 2:48 PM

## 2020-03-20 NOTE — Progress Notes (Signed)
Cultures taken from the chest wound, dressing done per order.

## 2020-03-20 NOTE — Progress Notes (Signed)
patient refusing blood transfusion, son at bedside,saying he will try to talk with the patient and sister. Will await further decision.

## 2020-03-20 NOTE — TOC Progression Note (Addendum)
Transition of Care [facility]) - Progression Note    Patient Details  Name: Bianca [name] MRN: [id number] Date of Birth: 1945/12/23  Transition of Care [facility]) CM/SW Contact  Nadene Rubins Adria Devon, RN Phone Number: 03/20/2020, 1:27 PM  Clinical Narrative:     Spoke to patient and son at bedside. Hospital bed, hoyer lift and wheel chair have been delivered to home. Patient already had VAC PTA. Son states plan is still to discharge home with home health , Kandee Keen with [facility] following and [facility] for palliative Care, [facility]rah[facility] following    Expected Discharge Plan: Home w Home Health Services Barriers to Discharge: Continued Medical Work up  Expected Discharge Plan and Services Expected Discharge Plan: Home w Home Health Services   Discharge Planning Services: CM Consult Post Acute Care Choice: Home Health, Durable Medical Equipment Living arrangements for the past 2 months: Single Family Home                 DME Arranged: Hospital bed, Lightweight manual wheelchair with seat cushion (hoyer)         HH Arranged: OT, PT, RN           Social Determinants of Health (SDOH) Interventions    Readmission Risk Interventions Readmission Risk Prevention Plan 03/04/2020 02/15/2020 02/14/2020  Transportation Screening Complete Complete -  PCP or Specialist Appt within 5-7 Days Complete Complete -  Home Care Screening Complete Complete -  Medication Review (RN CM) Complete Complete -  HRI or Home Care Consult - - Complete  Social Work Consult for Recovery Care Planning/Counseling - - Complete  Palliative Care Screening - - Not Applicable  Some recent data might be hidden

## 2020-03-20 NOTE — Progress Notes (Signed)
Physical Therapy Treatment Patient Details Name: Bianca [name] MRN: [id number] DOB: Feb 23, 1946 Today's Date: 03/20/2020    History of Present Illness 74 y.o. female with medical history significant of CAD s/p CABG; HTN; and DM presenting with lethargy.  She had CABG 1 month ago and was subsequently hospitalized from 5/17-25 for wound dehiscence and leg swelling. Pt with recent history of vomiting, limited P.O. intake, and weakness. CT head/MRI Barin revealing subacute CVA in b/l cerebral white matter and L globus pallidus.    PT Comments    Pt supine in bed.  She is more alert this session.  Pt performed sit to stand x 2 trials.  She required assistance without RW to achieve pivot from bed to recliner.  Pt continues to benefit from SNF placement based on weakness and need for slower progression of rehab.     Follow Up Recommendations  SNF     Equipment Recommendations  Hospital bed;Other (comment);Wheelchair (measurements PT);Wheelchair cushion (measurements PT)    Recommendations for Other Services       Precautions / Restrictions Precautions Precautions: Fall;Sternal Precaution Booklet Issued: No Precaution Comments: RLE wound vac, sternal cues Restrictions Weight Bearing Restrictions: Yes RUE Weight Bearing: Partial weight bearing RUE Partial Weight Bearing Percentage or Pounds: sternal precautions LUE Weight Bearing: Partial weight bearing Other Position/Activity Restrictions: sternal precautions    Mobility  Bed Mobility Overal bed mobility: Needs Assistance Bed Mobility: Supine to Sit;Sit to Supine     Supine to sit: Mod assist;+2 for physical assistance     General bed mobility comments: Pt with need for max verbal cues for preparatory exercises for BUEs, however pt with increased ability to complete bed mobility to simple one step commands  Transfers Overall transfer level: Needs assistance Equipment used: Rolling walker (2 wheeled);None;2 person hand held  assist Transfers: Sit to/from Stand Sit to Stand: Max assist;+2 physical assistance;+2 safety/equipment;From elevated surface Stand pivot transfers: Max assist;+2 physical assistance       General transfer comment: Heavy posterior lean noted on first trial with RW, however able to complete stand pivot with max A+2 on second attempt with 2 person HHA.   No steps taken during stand pivot.  Ambulation/Gait Ambulation/Gait assistance:  (NT)               Stairs             Wheelchair Mobility    Modified Rankin (Stroke Patients Only)       Balance Overall balance assessment: Needs assistance Sitting-balance support: Bilateral upper extremity supported;Feet supported Sitting balance-Leahy Scale: Fair Sitting balance - Comments: minguardA to minA overall for dynamic sitting balance   Standing balance support: Bilateral upper extremity supported Standing balance-Leahy Scale: Poor Standing balance comment: heavy posterior lean noted in standing, cognition remains barrier as pt is unable to heed cues to correct standing posture                            Cognition Arousal/Alertness: Awake/alert Behavior During Therapy: Flat affect Overall Cognitive Status: Impaired/Different from baseline Area of Impairment: Awareness;Safety/judgement;Following commands;Attention;Problem solving;Memory                   Current Attention Level: Selective Memory: Decreased recall of precautions;Decreased short-term memory Following Commands: Follows one step commands inconsistently;Follows one step commands with increased time Safety/Judgement: Decreased awareness of safety;Decreased awareness of deficits Awareness: Intellectual Problem Solving: Slow processing;Requires verbal cues;Requires tactile cues;Decreased initiation;Difficulty sequencing General Comments:  Pt continues to pick at linens in session, requiring cues to reorient, and max verbal cues to complete  oral care in appropriate sequence, pt unable to grasp concept of sitting in the chair or reclining to elevate legs, insisting on standing to "strech leg out" increased pain noted, and pt finally agreeable to legs elevated after attempted sit<>stand      Exercises General Exercises - Lower Extremity Ankle Circles/Pumps: AROM;Both;10 reps;Supine Heel Slides: AROM;Both;10 reps;Supine    General Comments        Pertinent Vitals/Pain Pain Assessment: Faces Faces Pain Scale: Hurts even more Pain Location: RLE with movement/wt bear Pain Descriptors / Indicators: Grimacing;Guarding Pain Intervention(s): Monitored during session;Repositioned    Home Living                      Prior Function            PT Goals (current goals can now be found in the care plan section) Acute Rehab PT Goals Patient Stated Goal: to get the pain to stop Potential to Achieve Goals: Fair Progress towards PT goals: Progressing toward goals    Frequency    Min 3X/week      PT Plan Current plan remains appropriate    Co-evaluation PT/OT/SLP Co-Evaluation/Treatment: Yes Reason for Co-Treatment: For patient/therapist safety (Required two sets of skilled hands to progress mobility as she was not able to make it from bed to chair last session with x1 skilled therapist) PT goals addressed during session: Mobility/safety with mobility OT goals addressed during session: ADL's and self-care      AM-PAC PT "6 Clicks" Mobility   Outcome Measure  Help needed turning from your back to your side while in a flat bed without using bedrails?: A Lot Help needed moving from lying on your back to sitting on the side of a flat bed without using bedrails?: Total Help needed moving to and from a bed to a chair (including a wheelchair)?: Total Help needed standing up from a chair using your arms (e.g., wheelchair or bedside chair)?: Total Help needed to walk in hospital room?: Total Help needed climbing 3-5  steps with a railing? : Total 6 Click Score: 7    End of Session Equipment Utilized During Treatment: Gait belt Activity Tolerance: Patient limited by fatigue;Patient limited by pain Patient left: in bed;with call bell/phone within reach;with bed alarm set;with family/visitor present Nurse Communication: Mobility status PT Visit Diagnosis: Muscle weakness (generalized) (M62.81);Unsteadiness on feet (R26.81)     Time: 3536-1443 (PTA left room when OT started ADLs) PT Time Calculation (min) (ACUTE ONLY): 19 min  Charges:  $Therapeutic Activity: 8-22 mins                     Erasmo Leventhal , PTA Acute Rehabilitation Services Pager [phone] Office [phone]     Cyrstal Leitz Eli Hose 03/20/2020, 6:29 PM

## 2020-03-20 NOTE — Progress Notes (Signed)
      [address]       [place] [address]             [phone]      Subjective:  Ms. Gravelle well known to [facility]. S/P CABG performed 02/08/2020.  She was recently admitted for RLE [facility] site infection, discharged with wound vac in place.  Has been admitted with hypotension due to dehydration, failure to thrive.  We have been asked to evaluate her surgical wounds.  Patient denies pain and has no specific complaints.    Objective: Vital signs in last 24 hours: Temp:  [98.6 F (37 C)-99.9 F (37.7 C)] 99.9 F (37.7 C) (06/10 1500) Pulse Rate:  [92-98] 98 (06/10 1500) Resp:  [17-18] 18 (06/10 1500) BP: (134-155)/(60-72) 145/63 (06/10 1500) SpO2:  [96 %-100 %] 97 % (06/10 1500) Weight:  [76.3 kg] 76.3 kg (06/09 1821)  Intake/Output from previous day: 06/09 0701 - 06/10 0700 In: 660 [P.O.:660] Out: 0  Intake/Output this shift: Total I/O In: 240 [P.O.:240] Out: 300 [Urine:300]  General appearance: alert, cooperative and no distress Heart: regular rate and rhythm Lungs: clear to auscultation bilaterally Wound: EVH site with wound vac in place              Inferior portion of sternum has dehisced, minimal erythema present, yellow slough visible.  Lab Results: Recent Labs    03/18/20 1740 03/20/20 0226  WBC 18.4* 15.2*  HGB 7.3* 7.1*  HCT 24.3* 23.3*  PLT 349 390   BMET:  Recent Labs    03/18/20 1543 03/20/20 0226  NA 137 135  K 3.5 3.6  CL 105 103  CO2 22 25  GLUCOSE 111* 63*  BUN 12 10  CREATININE 0.72 0.66  0.68  CALCIUM 7.9* 7.8*    PT/INR: No results for input(s): LABPROT, INR in the last 72 hours. ABG    Component Value Date/Time   PHART 7.342 (L) 02/08/2020 2029   HCO3 21.7 02/08/2020 2029   TCO2 23 02/08/2020 2029   ACIDBASEDEF 4.0 (H) 02/08/2020 2029   O2SAT 99.0 02/08/2020 2029   CBG (last 3)  Recent Labs    03/20/20 0742 03/20/20 0814 03/20/20 1140  GLUCAP 54* 78 121*    Assessment/Plan:  1. Previous RLE EVH wound  infection- vac in place for change tomorrow 2. Inferior Sternotomy dehiscence- slough/purulence present.. unfortunately wound was not cultured will request this to be done.. she has been started on ABX....Marland KitchenMarland Kitchen would not use Neosporin on this wound... wound instructions have been adjusted 3. Consult wound care-   Have placed consult for wound care for vac changes and recommendations for sternal wound.. not sure if she could tolerate an OR debridement 4. Dispo- we will follow patient's wounds... vac change tomorrow by wound care.. will get recommendations for sternal wound as well.  Dr. Cliffton Asters is aware of patient readmission, will follow up with further recommendations.. care per primary   LOS: 10 days    Lowella Dandy, PA-C  03/20/2020

## 2020-03-20 NOTE — Progress Notes (Signed)
Inpatient Diabetes Program Recommendations  AACE/ADA: New Consensus Statement on Inpatient Glycemic Control (2015)  Target Ranges:  Prepandial:   less than 140 mg/dL      Peak postprandial:   less than 180 mg/dL (1-2 hours)      Critically ill patients:  140 - 180 mg/dL   Lab Results  Component Value Date   GLUCAP 78 03/20/2020   HGBA1C 7.7 (H) 03/18/2020    Review of Glycemic Control Results for Bianca [name], Bianca [name] (MRN [id number]) as of 03/20/2020 11:20  Ref. Range 03/19/2020 07:53 03/19/2020 11:54 03/19/2020 18:24 03/19/2020 18:43 03/19/2020 20:57 03/20/2020 07:42 03/20/2020 08:14  Glucose-Capillary Latest Ref Range: 70 - 99 mg/dL 409 (H) 811 (H) 67 (L) 76 104 (H) 54 (L) 78   Diabetes history: DM 2 Outpatient Diabetes medications: Lantus 10 units daily Current orders for Inpatient glycemic control:  Lantus 10 units daily, Novolog sensitive (0-9 units) tid with meals and HS  Inpatient Diabetes Program Recommendations:    Consider holding Lantus.  Patient has already received dose this morning so may consider adding Dextrose to IV fluids to prevent further hypoglycemia.   Thanks  Beryl Meager, RN, BC-ADM Inpatient Diabetes Coordinator Pager [phone] (8a-5p)

## 2020-03-21 ENCOUNTER — Encounter: Payer: Medicare Other | Admitting: Thoracic Surgery (Cardiothoracic Vascular Surgery)

## 2020-03-21 DIAGNOSIS — I5023 Acute on chronic systolic (congestive) heart failure: Secondary | ICD-10-CM

## 2020-03-21 DIAGNOSIS — I6330 Cerebral infarction due to thrombosis of unspecified cerebral artery: Secondary | ICD-10-CM

## 2020-03-21 DIAGNOSIS — E8809 Other disorders of plasma-protein metabolism, not elsewhere classified: Secondary | ICD-10-CM

## 2020-03-21 DIAGNOSIS — D649 Anemia, unspecified: Secondary | ICD-10-CM

## 2020-03-21 DIAGNOSIS — Z515 Encounter for palliative care: Secondary | ICD-10-CM

## 2020-03-21 LAB — RENAL FUNCTION PANEL
Albumin: 1.6 g/dL — ABNORMAL LOW (ref 3.5–5.0)
Anion gap: 13 (ref 5–15)
BUN: 10 mg/dL (ref 8–23)
CO2: 22 mmol/L (ref 22–32)
Calcium: 8 mg/dL — ABNORMAL LOW (ref 8.9–10.3)
Chloride: 101 mmol/L (ref 98–111)
Creatinine, Ser: 0.64 mg/dL (ref 0.44–1.00)
GFR calc Af Amer: >60 mL/min (ref >60)
GFR calc non Af Amer: >60 mL/min (ref >60)
Glucose, Bld: 85 mg/dL (ref 70–99)
Phosphorus: 3.3 mg/dL (ref 2.5–4.6)
Potassium: 3.4 mmol/L — ABNORMAL LOW (ref 3.5–5.1)
Sodium: 136 mmol/L (ref 135–145)

## 2020-03-21 LAB — TYPE AND SCREEN
ABO/RH(D): O POS
Antibody Screen: NEGATIVE
Unit division: 0

## 2020-03-21 LAB — BPAM RBC
Blood Product Expiration Date: 202107122359
ISSUE DATE / TIME: 202106101448
Unit Type and Rh: 5100

## 2020-03-21 LAB — CBC
HCT: 29.8 % — ABNORMAL LOW (ref 36.0–46.0)
Hemoglobin: 9.3 g/dL — ABNORMAL LOW (ref 12.0–15.0)
MCH: 27.3 pg (ref 26.0–34.0)
MCHC: 31.2 g/dL (ref 30.0–36.0)
MCV: 87.4 fL (ref 80.0–100.0)
Platelets: 440 10*3/uL — ABNORMAL HIGH (ref 150–400)
RBC: 3.41 MIL/uL — ABNORMAL LOW (ref 3.87–5.11)
RDW: 17.3 % — ABNORMAL HIGH (ref 11.5–15.5)
WBC: 15.5 10*3/uL — ABNORMAL HIGH (ref 4.0–10.5)
nRBC: 0 % (ref 0.0–0.2)

## 2020-03-21 LAB — GLUCOSE, CAPILLARY
Glucose-Capillary: 90 mg/dL (ref 70–99)
Glucose-Capillary: 124 mg/dL — ABNORMAL HIGH (ref 70–99)
Glucose-Capillary: 136 mg/dL — ABNORMAL HIGH (ref 70–99)
Glucose-Capillary: 234 mg/dL — ABNORMAL HIGH (ref 70–99)

## 2020-03-21 LAB — CREATININE, SERUM
Creatinine, Ser: 0.67 mg/dL (ref 0.44–1.00)
GFR calc Af Amer: >60 mL/min (ref >60)
GFR calc non Af Amer: >60 mL/min (ref >60)

## 2020-03-21 LAB — FERRITIN: Ferritin: 1373 ng/mL — ABNORMAL HIGH (ref 11–307)

## 2020-03-21 LAB — IRON AND TIBC
Iron: 8 ug/dL — ABNORMAL LOW (ref 28–170)
Saturation Ratios: 6 % — ABNORMAL LOW (ref 10.4–31.8)
TIBC: 123 ug/dL — ABNORMAL LOW (ref 250–450)
UIBC: 115 ug/dL

## 2020-03-21 LAB — VITAMIN B12: Vitamin B-12: 509 pg/mL (ref 180–914)

## 2020-03-21 LAB — RETICULOCYTES
Immature Retic Fract: 27.1 % — ABNORMAL HIGH (ref 2.3–15.9)
RBC.: 3.37 MIL/uL — ABNORMAL LOW (ref 3.87–5.11)
Retic Count, Absolute: 76.8 10*3/uL (ref 19.0–186.0)
Retic Ct Pct: 2.3 % (ref 0.4–3.1)

## 2020-03-21 LAB — MAGNESIUM: Magnesium: 2 mg/dL (ref 1.7–2.4)

## 2020-03-21 LAB — FOLATE: Folate: 13.8 ng/mL (ref >5.9)

## 2020-03-21 MED ORDER — OSMOLITE 1.5 CAL PO LIQD
1000.0000 mL | ORAL | Status: DC
  Administered 2020-03-21 – 2020-03-24 (×4): 1000 mL
  Filled 2020-03-21 (×9): qty 1000

## 2020-03-21 MED ORDER — PRO-STAT SUGAR FREE PO LIQD
30.0000 mL | Freq: Two times a day (BID) | ORAL | Status: DC
  Administered 2020-03-21 – 2020-03-23 (×6): 30 mL
  Filled 2020-03-21 (×7): qty 30

## 2020-03-21 MED ORDER — INSULIN ASPART 100 UNIT/ML ~~LOC~~ SOLN
0.0000 [IU] | SUBCUTANEOUS | Status: DC
  Administered 2020-03-22: 2 [IU] via SUBCUTANEOUS
  Administered 2020-03-22: 8 [IU] via SUBCUTANEOUS
  Administered 2020-03-22: 5 [IU] via SUBCUTANEOUS
  Administered 2020-03-22: 3 [IU] via SUBCUTANEOUS
  Administered 2020-03-23: 5 [IU] via SUBCUTANEOUS
  Administered 2020-03-23 (×2): 11 [IU] via SUBCUTANEOUS
  Administered 2020-03-23: 8 [IU] via SUBCUTANEOUS

## 2020-03-21 MED ORDER — INSULIN ASPART 100 UNIT/ML ~~LOC~~ SOLN
0.0000 [IU] | SUBCUTANEOUS | Status: DC
  Administered 2020-03-21: 1 [IU] via SUBCUTANEOUS
  Administered 2020-03-21: 3 [IU] via SUBCUTANEOUS
  Administered 2020-03-22: 1 [IU] via SUBCUTANEOUS
  Administered 2020-03-22: 3 [IU] via SUBCUTANEOUS
  Administered 2020-03-22: 2 [IU] via SUBCUTANEOUS
  Administered 2020-03-22: 3 [IU] via SUBCUTANEOUS

## 2020-03-21 MED ORDER — POTASSIUM CHLORIDE CRYS ER 20 MEQ PO TBCR
40.0000 meq | EXTENDED_RELEASE_TABLET | ORAL | Status: AC
  Administered 2020-03-21 (×2): 40 meq via ORAL
  Filled 2020-03-21 (×2): qty 2

## 2020-03-21 MED ORDER — ALBUMIN HUMAN 25 % IV SOLN
12.5000 g | Freq: Four times a day (QID) | INTRAVENOUS | Status: AC
  Administered 2020-03-21 (×3): 12.5 g via INTRAVENOUS
  Filled 2020-03-21 (×3): qty 50

## 2020-03-21 MED ORDER — OSMOLITE 1.2 CAL PO LIQD
1000.0000 mL | ORAL | Status: DC
Start: 2020-03-21 — End: 2020-03-21

## 2020-03-21 MED ORDER — COLLAGENASE 250 UNIT/GM EX OINT
TOPICAL_OINTMENT | Freq: Every day | CUTANEOUS | Status: DC
  Filled 2020-03-21: qty 30

## 2020-03-21 MED ORDER — SERTRALINE HCL 50 MG PO TABS
50.0000 mg | ORAL_TABLET | Freq: Every day | ORAL | Status: DC
  Administered 2020-03-22 – 2020-03-25 (×4): 50 mg via ORAL
  Filled 2020-03-21 (×4): qty 1

## 2020-03-21 MED ORDER — FUROSEMIDE 10 MG/ML IJ SOLN
40.0000 mg | Freq: Once | INTRAMUSCULAR | Status: AC
  Administered 2020-03-21: 40 mg via INTRAVENOUS
  Filled 2020-03-21: qty 4

## 2020-03-21 NOTE — Progress Notes (Addendum)
      [address]       [place] [address]             [phone]            Subjective: When asked how she is doing, she shrugged.  Objective: Vital signs in last 24 hours: Temp:  [98.2 F (36.8 C)-99.9 F (37.7 C)] 99.1 F (37.3 C) (06/11 0637) Pulse Rate:  [92-103] 103 (06/11 0637) Resp:  [17-18] 17 (06/11 0637) BP: (134-162)/(60-72) 159/68 (06/11 0637) SpO2:  [96 %-100 %] 100 % (06/11 8563)   Current Weight  03/19/20 76.3 kg       Intake/Output from previous day: 06/10 0701 - 06/11 0700 In: 1168.9 [P.O.:360; I.V.:193.9; Blood:315; IV Piggyback:300] Out: 2300 [Urine:2250; Drains:50]   Physical Exam:  Cardiovascular:Tachycardia Pulmonary: Clear to auscultation bilaterally Abdomen: Soft, non tender, bowel sounds present. Extremities: Wound vac in place RLE  Wounds: There is some hardened,sloughy tissue middle to lower sternal wound and superficially dehisced.   Lab Results: CBC: Recent Labs    03/20/20 0226 03/21/20 0318  WBC 15.2* 15.5*  HGB 7.1* 9.3*  HCT 23.3* 29.8*  PLT 390 440*   BMET:  Recent Labs    03/20/20 0226 03/21/20 0318  NA 135 136  K 3.6 3.4*  CL 103 101  CO2 25 22  GLUCOSE 63* 85  BUN 10 10  CREATININE 0.66  0.68 0.64  0.67  CALCIUM 7.8* 8.0*    PT/INR:  Lab Results  Component Value Date   INR 1.3 (H) 03/09/2020   INR 1.2 02/25/2020   INR 1.5 (H) 02/08/2020   ABG:  INR: Will add last result for INR, ABG once components are confirmed Will add last 4 CBG results once components are confirmed  Assessment/Plan:  1. CV - ST at times with HR in the low 100's. On ecasa 81 mg daily and Plavix 75 mg daily.  2.  Pulmonary - On room air. 3. Volume Overload - Lasix 40 mg IV ordered 4.  Anemia - H and H this am 9.3 and 29.8 5. Supplement potassium 6. GI-on Marinol and Glucerna. 7. ID-on Vancomycin for right EVH/sternal wound. WBC remains 15,500. Patient refused culture of sternal wound. Wound care  consult already placed. ? If sternal wound needs further debridement  Bianca M ZimmermanPA-C 03/21/2020,8:02 AM   Agree with above. Patient is severely malnourished with an albumin of 1.6.  This is likely the cause of her sternal wound breakdown and decubitus ulcer.  She continues to have very poor p.o. intake.  Spoke with her daughter and highly recommended that she undergo some type of feeding tube placement.  She would tolerate the nasogastric tube but the patient is resistant.  Without adequate nutrition she will never heal these wounds, and there is no sense and debriding her sternal wound without improve nutrition.  Continue local wound care.   We will follow peripherally Bianca [name]

## 2020-03-21 NOTE — Progress Notes (Signed)
Patient refused for another culture taken from her chest wound, patient upset and wouldn't like to have her chest opened again this evening, Microbiology is made aware.

## 2020-03-21 NOTE — Plan of Care (Signed)
  Problem: Nutrition: Goal: Adequate nutrition will be maintained Outcome: Not Progressing   

## 2020-03-21 NOTE — Progress Notes (Signed)
Physical Therapy Treatment Patient Details Name: Bianca [name] MRN: [id number] DOB: Sep 06, 1946 Today's Date: 03/21/2020    History of Present Illness 74 y.o. female with medical history significant of CAD s/p CABG; HTN; and DM presenting with lethargy.  She had CABG 1 month ago and was subsequently hospitalized from 5/17-25 for wound dehiscence and leg swelling. Pt with recent history of vomiting, limited P.O. intake, and weakness. CT head/MRI [place] revealing subacute CVA in b/l cerebral white matter and L globus pallidus.    PT Comments    Pt supine in bed on arrival.  She is minimally conversive but able to move to sitting and from bed to recliner with max-total assistance+1.  Pt limited due to pain in RLE.  Grimmacing noted with any movement of this leg.  Performed transfer to her L side to limit pain.  Continue to recommend snf at d/c.  Increased time place on educating patient on eating to aide in healing of wounds.    Follow Up Recommendations  SNF     Equipment Recommendations  Hospital bed;Other (comment);Wheelchair (measurements PT);Wheelchair cushion (measurements PT)    Recommendations for Other Services       Precautions / Restrictions Precautions Precautions: Fall;Sternal Precaution Booklet Issued: No Precaution Comments: RLE wound vac, sternal cues Restrictions Weight Bearing Restrictions: Yes RUE Weight Bearing: Partial weight bearing LUE Weight Bearing: Partial weight bearing Other Position/Activity Restrictions: sternal precautions    Mobility  Bed Mobility Overal bed mobility: Needs Assistance Bed Mobility: Supine to Sit     Supine to sit: Mod assist     General bed mobility comments: pt required assistance to move B LEs to edge of bed and to elevate trunk into a seated position.  Pt presents with posterior bias.  Pt continues to require cues for weight shifting forward to maintain sitting edge of bed.  Transfers Overall transfer level: Needs  assistance Equipment used: None Transfers: Sit to/from [employer] Sit to Stand: Max assist;From elevated surface Stand pivot transfers: Total assist       General transfer comment: Pt performed transfers with PTA guarding forward facing.  Increased assistance to rise into standing.  Once in standing performed pivot from bed to recliner chair.  Pt unable to take steps increased assistance to move hips from surface to surface.  Ambulation/Gait Ambulation/Gait assistance:  (NT)               Stairs             Wheelchair Mobility    Modified Rankin (Stroke Patients Only)       Balance Overall balance assessment: Needs assistance Sitting-balance support: Bilateral upper extremity supported;Feet supported Sitting balance-Leahy Scale: Fair Sitting balance - Comments: minguardA to minA overall for dynamic sitting balance     Standing balance-Leahy Scale: Poor Standing balance comment: heavy posterior lean noted in standing, cognition remains barrier as pt is unable to heed cues to correct standing posture, external assistance needed for standing                            Cognition Arousal/Alertness: Awake/alert Behavior During Therapy: Flat affect Overall Cognitive Status: Impaired/Different from baseline Area of Impairment: Awareness;Safety/judgement;Following commands;Attention;Problem solving;Memory                 Orientation Level: Place;Time;Situation Current Attention Level: Selective Memory: Decreased recall of precautions;Decreased short-term memory Following Commands: Follows one step commands inconsistently;Follows one step commands with increased time  Safety/Judgement: Decreased awareness of safety;Decreased awareness of deficits Awareness: Intellectual Problem Solving: Slow processing;Requires verbal cues;Requires tactile cues;Decreased initiation;Difficulty sequencing General Comments: Pt minimally conversive.   Continues to follow commands for therapy with increased time.  Pt educated on the benefits of oral intake to heal wounds.  eating magic cup post session.      Exercises      General Comments        Pertinent Vitals/Pain Pain Assessment: Faces Faces Pain Scale: Hurts whole lot Pain Location: RLE with movement/wt bear Pain Descriptors / Indicators: Grimacing;Guarding Pain Intervention(s): Monitored during session;Repositioned    Home Living                      Prior Function            PT Goals (current goals can now be found in the care plan section) Acute Rehab PT Goals Patient Stated Goal: to get the pain to stop Potential to Achieve Goals: Fair Additional Goals Additional Goal #1: pt will maintain dynamic standing balance within 10 inches of her base of support with unilateral UE support of the LRAD Progress towards PT goals: Progressing toward goals    Frequency    Min 3X/week      PT Plan Current plan remains appropriate    Co-evaluation              AM-PAC PT "6 Clicks" Mobility   Outcome Measure  Help needed turning from your back to your side while in a flat bed without using bedrails?: A Lot Help needed moving from lying on your back to sitting on the side of a flat bed without using bedrails?: Total Help needed moving to and from a bed to a chair (including a wheelchair)?: Total Help needed standing up from a chair using your arms (e.g., wheelchair or bedside chair)?: Total Help needed to walk in hospital room?: Total Help needed climbing 3-5 steps with a railing? : Total 6 Click Score: 7    End of Session Equipment Utilized During Treatment: Gait belt Activity Tolerance: Patient limited by fatigue;Patient limited by pain Patient left: in bed;with call bell/phone within reach;with bed alarm set;with family/visitor present Nurse Communication: Mobility status PT Visit Diagnosis: Muscle weakness (generalized) (M62.81);Unsteadiness on  feet (R26.81)     Time: 4431-5400 PT Time Calculation (min) (ACUTE ONLY): 19 min  Charges:  $Therapeutic Activity: 8-22 mins                     Erasmo Leventhal , PTA Acute Rehabilitation Services Pager [phone] Office [phone]     Teal Raben Eli Hose 03/21/2020, 11:45 AM

## 2020-03-21 NOTE — Consult Note (Signed)
WOC Nurse Consult Note: Reason for Consult:Sternal surgical wound.  Blue sutures present with 50% adherent white slough Right lower extremity NPWT (VAC) Dressing.  Bedside RN performing care to this site. I am discontinuing NPWT today.  unstageable sacral wound noted today as well.  Will initiate enzymatic debridement on sacrum and sternal wounds.  Wound type:surgical and pressure Pressure Injury POA: No Measurement:Sternal wound:  8 cm x 1.2 cm 50% adherent slough Right lower extremity 3 cm x 1.5 cm x 0.5 cm  Sacral:  3.2 cm x 1.6 cm 100% slough Wound FRH:[id number] to sternum and sacral wound Pale pink to right lower extremity.  Drainage (amount, consistency, odor) minimal serosanguinous  No odor.  Periwound:intact Dressing procedure/placement/frequency: Cleanse wounds to sacrum and sternum with NS and pat dry.  Apply Santyl to wound bed.  Cover with NS moist gauze and secure with dry dressing. Change daily.  Stop VAC to right lower leg. Cleanse wound to right lower leg with NS and pat dry . Apply NS moist gauze to wound bed and cover with dry gauze, secure with dry dressing and tape.  Change daily.  Will not follow at this time.  Please re-consult if needed.  Maple Hudson MSN, RN, FNP-BC CWON Wound, Ostomy, Continence Nurse Pager [phone]

## 2020-03-21 NOTE — Progress Notes (Signed)
Daily Progress Note   Patient Name: Bianca [name]       Date: 03/21/2020 DOB: 16-Dec-1945  Age: 74 y.o. MRN#: [id number] Attending Physician: Mercy Riding, MD Primary Care Physician: Julian Hy, PA-C Admit Date: 03/09/2020  Reason for Consultation/Follow-up: To discuss complex medical decision making related to patient's goals of care  Subjective: Met with patient, Joletta and [name] at bedside.  Patient is sitting up, bright and more interactive than last I saw her (Sunday 6/6).  She tells me she has had a bowel movement and needs to be changed.   Cor trak in now in place.  Patient is slightly agitated with it.    Discussed plans with Joletta.  She is encouraged by the improvement and hopeful that the support (feed supplement via n/g) will give her mother enough energy and motivation to pick up and eat on her own.  However Joletta re-iterates that she does not want a PEG placed - she does not want any surgical procedures done on her mother.  Patient has started taking her medications for nursing staff.  Patient now diagnosed with multiple sub acute strokes as well as non-stageable sacral wound.  Further her chest wound has become worse.  Assessment: Severe mal nutrition, depression, failure to thrive post CABG.  Related at least in part to multiple CVAs.  Full scope treatment.  Family hopeful for improvement.   Patient Profile/HPI:  74 y.o. female  with past medical history of CAD s/p CABG recently in 2021, DM, constipation and hypertension, who was admitted on 03/09/2020 with lethargy.  She was hospitalized 5/17 - 5/25 for wound dehiscence and leg swelling at the harvest site.  Since her surgery April 30th the patient has failed to thrive.  She has had 5 hospitalizations in 3 months  and is no longer eating enough to sustain herself long term.   PMT was consulted for goals of care.    Length of Stay: 11   Vital Signs: BP 126/60 (BP Location: Left Arm)   Pulse 92   Temp 98 F (36.7 C) (Oral)   Resp 18   Ht '5\' 6"'$  (1.676 m)   Wt 76.3 kg Comment: wrong weight first entered  SpO2 100%   BMI 27.15 kg/m  SpO2: SpO2: 100 % O2 Device: O2 Device: Room Air O2 Flow  Rate:         Palliative Assessment/Data: 20%    Palliative Care Plan    Recommendations/Plan:  Continue to encourage mobilization.  Will up titrate Zoloft to 50 mg daily.  Support Cor Trak Feeding, but family firm that they do not want a PEG.  Continue wound care.  Sacral and chest wound.  Wound vac to be removed from leg wound today.  Code Status:  Full code - need to re-address for family.  DNR would offer protection against harm in her final moments of life.  Prognosis:   Unable to determine however, given her recent decline less than 6 months would not be surprising.   Discharge Planning:  To Be Determined  Care plan was discussed with family, will reach out to Dr. Cyndia Skeeters.  Thank you for allowing the Palliative Medicine Team to assist in the care of this patient.  Total time spent:  35 min.     Greater than 50%  of this time was spent counseling and coordinating care related to the above assessment and plan.  [name], PA-C Palliative Medicine  Please contact Palliative MedicineTeam phone at [phone] for questions and concerns between 7 am - 7 pm.   Please see AMION for individual provider pager numbers.

## 2020-03-21 NOTE — Progress Notes (Signed)
Nutrition Follow-up  DOCUMENTATION CODES:   Severe malnutrition in context of acute illness/injury  INTERVENTION:   -Continue Magic cup TID with meals, each supplement provides 290 kcal and 9 grams of protein -Continue Ensure Max po daily, each supplement provides 150 kcal and 30 grams of protein.  -Continue Glucerna Shake po BID, each supplement provides 220 kcal and 10 grams of protein -Continue liberalized diet of regular  -Initiate Osmolite 1.5 @ 25 ml/hr via cortrak tube and increase by 10 ml every 8 hours to goal rate of 55 ml/hr.   30 ml Prostat BID.    Tube feeding regimen provides 2180 kcal (100% of needs), 113 grams of protein, and 1006 ml of H2O.   -Monitor Mg, K, and Phos daily and replete as necessary as pt is at high refeeding risk  NUTRITION DIAGNOSIS:   Severe Malnutrition related to acute illness (CAD s/p CABG) as evidenced by energy intake < or equal to 75% for > or equal to 1 month, mild fat depletion, moderate fat depletion, moderate muscle depletion, mild muscle depletion, percent weight loss.  Ongoing  GOAL:   Patient will meet greater than or equal to 90% of their needs  Progressing   MONITOR:   PO intake, Supplement acceptance, Diet advancement, Labs, Weight trends, Skin, I & O's  REASON FOR ASSESSMENT:   Consult Assessment of nutrition requirement/status  ASSESSMENT:   Bianca [name] is a 74 y.o. female with medical history significant of CAD s/p CABG; HTN; and DM presenting with lethargy.  She had CABG 1 month ago and was subsequently hospitalized from 5/17-25 for wound dehiscence and leg swelling.  6/11- s/p cortrak tube placement- tip of tube confirmed in the stomach   Reviewed I/O's: -1.1 L x 24 hours and +8.7 L since admission  UOP: 2.3 L x 24 hours  Drain output: 50 ml x 24 hours  Per CWOCN note today, pt with sternal surgical wound with white slough and unstageable sacral wound. RLE wound vac to be d/c today.  Case discussed  with RN, who reports intake continues to be poor. Noted meal completion less than 25%. Per CVTS notes, needs improved nutrition for sternal wound debridement. Cortrak tube placed today and received permission from MD to start TF.  Labs reviewed: K: 3.4.   Diet Order:   Diet Order            Diet regular Room service appropriate? Yes; Fluid consistency: Thin  Diet effective now                 EDUCATION NEEDS:   Education needs have been addressed  Skin:  Skin Assessment: Skin Integrity Issues: Skin Integrity Issues:: Unstageable Stage II: - Unstageable: sacrum Wound Vac: d/c 03/21/20 Incisions: sternal surgical wound with adherent white slough, rt lower extremity  Last BM:  03/20/20  Height:   Ht Readings from Last 1 Encounters:  03/09/20 5\' 6"  (1.676 m)    Weight:   Wt Readings from Last 1 Encounters:  03/19/20 76.3 kg    Ideal Body Weight:  59.1 kg  BMI:  Body mass index is 27.15 kg/m.  Estimated Nutritional Needs:   Kcal:  2000-2200  Protein:  110-125 grams  Fluid:  > 2 L    05/19/20, RD, LDN, CDCES Registered Dietitian II Certified Diabetes Care and Education Specialist Please refer to [facility] for RD and/or RD on-call/weekend/after hours pager

## 2020-03-21 NOTE — Progress Notes (Signed)
PROGRESS NOTE  Bianca [name] GBE:[id number] DOB: September 04, 1946   PCP: Luciano Cutter, PA-C  Patient is from: Home  DOA: 03/09/2020 LOS: 11  Brief Narrative / Interim history: 74 year old female with history of CAD/CABG on 02/08/2020, DM-2, HTN and recent hospitalization from 5/17-5/25 for wound dehiscence and leg swelling presenting with poor p.o. intake and emesis, and admitted for hypotension, AKI and FTT/severe protein calorie malnutrition.  She was started on IV fluid.  Patient had CT head and MRI.  That revealed multiple subacute/chronic infarcts including globus pallidus, right parietal lobe and bilateral frontal lobes.  Bilateral CUS without significant finding.  Echo with mildly reduced EF.  Neurology consulted.  CTA head and neck without flow obstructing stenosis.  Patient was noted to have some purulent discharge from sternotomy wound site.  Started on IV vancomycin.  Cardiothoracic surgery consulted.  Patient continued to do poorly from p.o. intake standpoint.  Eventually started on tube feed via cortrak on 03/21/2020.  Also started on IV Lasix with IV albumin.  Patient remains full code with full scope of care.  Palliative care recommended outpatient follow-up.  Subjective: Seen and examined earlier this morning.  No major events overnight or this morning.  No complaints.  She denies pain, shortness of breath or GI symptoms.  Patient's daughter at bedside.  We discussed about her treatment plan including diuretics and the need for tube feed as patient's p.o. intake remains poor.  Objective: Vitals:   03/20/20 1821 03/20/20 2123 03/21/20 0637 03/21/20 1411  BP: 138/69 (!) 162/70 (!) 159/68 126/60  Pulse: 96 96 (!) 103 92  Resp: 18 17 17 18   Temp: 99.5 F (37.5 C) 98.2 F (36.8 C) 99.1 F (37.3 C) 98 F (36.7 C)  TempSrc: Oral Oral Oral Oral  SpO2: 97% 100% 100% 100%  Weight:      Height:        Intake/Output Summary (Last 24 hours) at 03/21/2020 1426 Last data  filed at 03/21/2020 1000 Gross per 24 hour  Intake 928.87 ml  Output 3100 ml  Net -2171.13 ml   Filed Weights   03/09/20 0209 03/19/20 1821  Weight: 64 kg 76.3 kg    Examination:  GENERAL: Frail and chronically ill-appearing.  No apparent distress.  Nontoxic. HEENT: MMM.  Vision and hearing grossly intact.  NECK: Supple.  No apparent JVD.  RESP:  No IWOB.  Fair aeration bilaterally. CVS:  RRR. Heart sounds normal.  ABD/GI/GU: BS+. Abd soft, NTND.  MSK/EXT: 2+ pitting edema bilaterally.  Some tenderness and induration over right thigh. SKIN: Dressing over sternotomy wound DCI.  Wound VAC over RLE. NEURO: Awake, alert and oriented fairly..  No apparent focal neuro deficit. PSYCH: Calm.  Somewhat flat affect.    Procedures:  None  Microbiology summarized: COVID-19 PCR negative. Blood cultures negative.  Assessment & Plan: Hypotension due to dehydration in the setting of poor p.o. intake which continues to be an issue.  Echo with EF of 45 to 50%, indeterminate diastolic function and no other significant finding.  Has leukocytosis but no obvious source of infection other than some purulent discharge from his sternotomy wound.  TSH and cortisol level within normal. -Continue holding home antihypertensive medications -IV vancomycin for possible wound infection -Start tube feed for poor p.o. intake/malnutrition  AKI/prerenal azotemia: Due to hypotension with concurrent NSAID use, diuretics, ARB.  Resolved. -Continue monitoring while on diuretics.  Subacute/chronic CVA in multiple territories: Likely ischemic insult in the setting of hypotension and atherosclerotic changes.  CTA head  and neck without flow obstructing stenosis. -Neurology following-plan for DAPT for 3 weeks followed by single agent -Continue statin.  CAD/CABG on 02/08/2020 complicated by wound dehiscence and delayed healing-concern about some purulent drainage from sternotomy wound site. -Continue IV vancomycin.  TCTS consulted -continue aspirin. ARB, diuretics on hold due to hypotension.  -Continue statin and Plavix.  Acute on chronic systolic CHF: Echo with EF of 45 to 50% and RVSP to 25.3.  Unknown diastolic function.  Looks fluid overloaded with significant lower extremity edema's.  Weight uptrended although.  No cardiopulmonary symptoms. -IV Lasix 40 mg once with IV albumin -Monitor fluid status and renal function  Uncontrolled DM-2 with hypoglycemia: A1c 7.1% on 02/25/2020 Recent Labs    03/20/20 2126 03/21/20 0746 03/21/20 1210  GLUCAP 82 90 124*  -SSI every 4 hours now she is on tube feed. -Adjust insulin as appropriate  Hypokalemia: Resolved. -Recheck and replenish as appropriate  Iron deficiency anemia/anemia of chronic disease: Baseline Hgb 7-8>> 7.3>7.1>1u> 9.3.  Iron sat 6%.  TIBC 123 (low).  Received IV Feraheme and 1 unit of blood transfusion -Monitor H&H-relatively stable  Hyperlipidemia: LDL down from 152-22 in late in 3 months. -Continue statin.  Recent wound dehiscence: SVG site -Continue wound VAC  Leukocytosis/bandemia: Concerned about her sternotomy wound.  Improved. -Antibiotics as above  Constipation:  -Scheduled MiraLAX and Colace daily  Multiple thyroid nodules -Nonemergent thyroid ultrasound for further evaluation  Debility/generalized weakness/physical deconditioning-multifactorial including CAD, hypertension, FTT and anemia -PT/OT  Goal of care-significant comorbidities as above.  Poor prognosis, likely little 6 months.  Still full code which would pose more harm than benefit given his situation.   -Appreciate palliative medicine help  Severe Protein Calorie Malnutrition/failure to thrive: Poor p.o. intake, only meeting 25% of her requirements.  Significant hypoalbuminemia.  Nutrition Problem: Severe Malnutrition Etiology: acute illness (CAD s/p CABG) Signs/Symptoms: energy intake < or equal to 75% for > or equal to 1 month, mild fat depletion,  moderate fat depletion, moderate muscle depletion, mild muscle depletion, percent weight loss Percent weight loss: 19.9 % Interventions: Glucerna shake, MVI, Magic cup, Premier Protein  Started on tube feed via cortrack.  Sacral decubitus: POA Pressure Injury 03/12/20 Coccyx Mid Stage 2 -  Partial thickness loss of dermis presenting as a shallow open injury with a red, pink wound bed without slough. (Active)  03/12/20 0930  Location: Coccyx  Location Orientation: Mid  Staging: Stage 2 -  Partial thickness loss of dermis presenting as a shallow open injury with a red, pink wound bed without slough.  Wound Description (Comments):   Present on Admission:    DVT prophylaxis: Subcu Lovenox Code Status: Full code Family Communication: Updated patient's daughter at bedside. Status is: Inpatient  Remains inpatient appropriate because:Hemodynamically unstable, IV treatments appropriate due to intensity of illness or inability to take PO and Inpatient level of care appropriate due to severity of illness   Dispo: The patient is from: Home              Anticipated d/c is to: Home              Anticipated d/c date is: 3 days              Patient currently is not medically stable to d/c.       Consultants:  Neurology Palliative medicine   Sch Meds:  Scheduled Meds: . acetaminophen  650 mg Oral Q6H  . aspirin EC  81 mg Oral Daily  . atorvastatin  80 mg Oral Daily  . bisacodyl  10 mg Rectal Daily  . Chlorhexidine Gluconate Cloth  6 each Topical Daily  . clopidogrel  75 mg Oral Daily  . collagenase   Topical Daily  . docusate sodium  100 mg Oral BID  . dronabinol  2.5 mg Oral BID AC  . enoxaparin (LOVENOX) injection  40 mg Subcutaneous Daily  . feeding supplement (GLUCERNA SHAKE)  237 mL Oral BID BM  . feeding supplement (PRO-STAT SUGAR FREE 64)  30 mL Per Tube BID  . insulin aspart  0-5 Units Subcutaneous QHS  . insulin aspart  0-9 Units Subcutaneous TID WC  . loratadine  10 mg  Oral Daily  . LORazepam  0.25 mg Oral Once  . multivitamin with minerals  1 tablet Oral Daily  . polyethylene glycol  17 g Oral Daily  . Ensure Max Protein  11 oz Oral Daily  . sertraline  25 mg Oral Daily  . sodium chloride flush  3 mL Intravenous Q12H   Continuous Infusions: . albumin human 12.5 g (03/21/20 1015)  . feeding supplement (OSMOLITE 1.5 CAL)    . vancomycin 750 mg (03/21/20 0805)   PRN Meds:.hydrALAZINE, ondansetron **OR** ondansetron (ZOFRAN) IV  Antimicrobials: Anti-infectives (From admission, onward)   Start     Dose/Rate Route Frequency Ordered Stop   03/20/20 0830  vancomycin (VANCOREADY) IVPB 750 mg/150 mL     Discontinue     750 mg 150 mL/hr over 60 Minutes Intravenous Every 12 hours 03/19/20 1848     03/19/20 1915  vancomycin (VANCOREADY) IVPB 1500 mg/300 mL        1,500 mg 150 mL/hr over 120 Minutes Intravenous  Once 03/19/20 1848 03/19/20 2255       I have personally reviewed the following labs and images: CBC: Recent Labs  Lab 03/18/20 1740 03/20/20 0226 03/21/20 0318  WBC 18.4* 15.2* 15.5*  NEUTROABS 15.9* 12.7*  --   HGB 7.3* 7.1* 9.3*  HCT 24.3* 23.3* 29.8*  MCV 88.0 87.3 87.4  PLT 349 390 440*   BMP &GFR Recent Labs  Lab 03/15/20 0108 03/18/20 1543 03/19/20 1020 03/20/20 0226 03/21/20 0318  NA 140 137  --  135 136  K 3.8 3.5  --  3.6 3.4*  CL 109 105  --  103 101  CO2 22 22  --  25 22  GLUCOSE 149* 111*  --  63* 85  BUN 21 12  --  10 10  CREATININE 0.76 0.72  --  0.66  0.68 0.64  0.67  CALCIUM 8.2* 7.9*  --  7.8* 8.0*  MG  --   --  1.9 2.0 2.0  PHOS  --   --  3.3 3.1 3.3   Estimated Creatinine Clearance: 63.4 mL/min (by C-G formula based on SCr of 0.67 mg/dL). Liver & Pancreas: Recent Labs  Lab 03/20/20 0226 03/21/20 0318  ALBUMIN 1.5* 1.6*   No results for input(s): LIPASE, AMYLASE in the last 168 hours. No results for input(s): AMMONIA in the last 168 hours. Diabetic: Recent Labs    03/18/20 1740  HGBA1C 7.7*     Recent Labs  Lab 03/20/20 1140 03/20/20 1643 03/20/20 2126 03/21/20 0746 03/21/20 1210  GLUCAP 121* 87 82 90 124*   Cardiac Enzymes: No results for input(s): CKTOTAL, CKMB, CKMBINDEX, TROPONINI in the last 168 hours. No results for input(s): PROBNP in the last 8760 hours. Coagulation Profile: No results for input(s): INR, PROTIME in the last 168 hours. Thyroid  Function Tests: Recent Labs    03/20/20 0226  TSH 0.578   Lipid Profile: No results for input(s): CHOL, HDL, LDLCALC, TRIG, CHOLHDL, LDLDIRECT in the last 72 hours. Anemia Panel: Recent Labs    03/21/20 0807  VITAMINB12 509  FOLATE 13.8  FERRITIN 1,373*  TIBC 123*  IRON 8*  RETICCTPCT 2.3   Urine analysis:    Component Value Date/Time   COLORURINE YELLOW 03/09/2020 0635   APPEARANCEUR HAZY (A) 03/09/2020 0635   LABSPEC 1.020 03/09/2020 0635   PHURINE 5.0 03/09/2020 0635   GLUCOSEU NEGATIVE 03/09/2020 0635   HGBUR NEGATIVE 03/09/2020 0635   BILIRUBINUR NEGATIVE 03/09/2020 0635   BILIRUBINUR neg 02/20/2015 1040   KETONESUR NEGATIVE 03/09/2020 0635   PROTEINUR 100 (A) 03/09/2020 0635   UROBILINOGEN 0.2 02/20/2015 1040   UROBILINOGEN 1.0 02/04/2015 1350   NITRITE NEGATIVE 03/09/2020 0635   LEUKOCYTESUR NEGATIVE 03/09/2020 0635   Sepsis Labs: Invalid input(s): PROCALCITONIN, [place]  Microbiology: Recent Results (from the past 240 hour(s))  MRSA PCR Screening     Status: None   Collection Time: 03/19/20 11:08 PM   Specimen: Nasal Mucosa; Nasopharyngeal  Result Value Ref Range Status   MRSA by PCR NEGATIVE NEGATIVE Final    Comment:        The GeneXpert MRSA Assay (FDA approved for NASAL specimens only), is one component of a comprehensive MRSA colonization surveillance program. It is not intended to diagnose MRSA infection nor to guide or monitor treatment for MRSA infections. Performed at [facility], [place] [address], [place], [place] [address]   Aerobic Culture (superficial  specimen)     Status: None (Preliminary result)   Collection Time: 03/20/20  5:03 PM   Specimen: Wound  Result Value Ref Range Status   Specimen Description WOUND STERNUM  Final   Special Requests NONE  Final   Gram Stain   Final    FEW WBC PRESENT, PREDOMINANTLY PMN FEW GRAM POSITIVE COCCI IN CLUSTERS RARE GRAM POSITIVE RODS    Culture   Final    TOO YOUNG TO READ Performed at [facility], [place] [address], [place], [place] [address]    Report Status PENDING  Incomplete    Radiology Studies: No results found.    Nikitas Davtyan T. [place]  If 7PM-7AM, please contact night-coverage [email or web address] Password [facility] 03/21/2020, 2:26 PM

## 2020-03-21 NOTE — Procedures (Signed)
Cortrak  Person Inserting Tube:  Mark Hassey E, RD Tube Type:  Cortrak - 43 inches Tube Location:  Right nare Initial Placement:  Stomach Secured by: Bridle Technique Used to Measure Tube Placement:  Documented cm marking at nare/ corner of mouth Cortrak Secured At:  65 cm    Cortrak Tube Team Note:  Consult received to place a Cortrak feeding tube.   No x-ray is required. RN may begin using tube.    If the tube becomes dislodged please keep the tube and contact the Cortrak team at [email or web address] (password [email or web address]) for replacement.  If after hours and replacement cannot be delayed, place a NG tube and confirm placement with an abdominal x-ray.    Ezme Duch, MS, RD, LDN RD pager number and weekend/on-call pager number located in Amion.   

## 2020-03-22 LAB — RENAL FUNCTION PANEL
Albumin: 2 g/dL — ABNORMAL LOW (ref 3.5–5.0)
Anion gap: 9 (ref 5–15)
BUN: 12 mg/dL (ref 8–23)
CO2: 26 mmol/L (ref 22–32)
Calcium: 7.9 mg/dL — ABNORMAL LOW (ref 8.9–10.3)
Chloride: 102 mmol/L (ref 98–111)
Creatinine, Ser: 0.63 mg/dL (ref 0.44–1.00)
GFR calc Af Amer: >60 mL/min (ref >60)
GFR calc non Af Amer: >60 mL/min (ref >60)
Glucose, Bld: 229 mg/dL — ABNORMAL HIGH (ref 70–99)
Phosphorus: 2.2 mg/dL — ABNORMAL LOW (ref 2.5–4.6)
Potassium: 3.5 mmol/L (ref 3.5–5.1)
Sodium: 137 mmol/L (ref 135–145)

## 2020-03-22 LAB — GLUCOSE, CAPILLARY
Glucose-Capillary: 142 mg/dL — ABNORMAL HIGH (ref 70–99)
Glucose-Capillary: 181 mg/dL — ABNORMAL HIGH (ref 70–99)
Glucose-Capillary: 207 mg/dL — ABNORMAL HIGH (ref 70–99)
Glucose-Capillary: 211 mg/dL — ABNORMAL HIGH (ref 70–99)
Glucose-Capillary: 215 mg/dL — ABNORMAL HIGH (ref 70–99)
Glucose-Capillary: 245 mg/dL — ABNORMAL HIGH (ref 70–99)
Glucose-Capillary: 293 mg/dL — ABNORMAL HIGH (ref 70–99)

## 2020-03-22 LAB — CBC
HCT: 25.1 % — ABNORMAL LOW (ref 36.0–46.0)
Hemoglobin: 7.8 g/dL — ABNORMAL LOW (ref 12.0–15.0)
MCH: 26.9 pg (ref 26.0–34.0)
MCHC: 31.1 g/dL (ref 30.0–36.0)
MCV: 86.6 fL (ref 80.0–100.0)
Platelets: 448 10*3/uL — ABNORMAL HIGH (ref 150–400)
RBC: 2.9 MIL/uL — ABNORMAL LOW (ref 3.87–5.11)
RDW: 17.5 % — ABNORMAL HIGH (ref 11.5–15.5)
WBC: 14.5 10*3/uL — ABNORMAL HIGH (ref 4.0–10.5)
nRBC: 0 % (ref 0.0–0.2)

## 2020-03-22 LAB — MAGNESIUM: Magnesium: 2 mg/dL (ref 1.7–2.4)

## 2020-03-22 MED ORDER — SODIUM CHLORIDE 0.9 % IV SOLN
2.0000 g | Freq: Three times a day (TID) | INTRAVENOUS | Status: DC
  Administered 2020-03-22 – 2020-03-25 (×10): 2 g via INTRAVENOUS
  Filled 2020-03-22 (×12): qty 2

## 2020-03-22 MED ORDER — VANCOMYCIN HCL 750 MG/150ML IV SOLN
750.0000 mg | Freq: Two times a day (BID) | INTRAVENOUS | Status: DC
  Administered 2020-03-22: 750 mg via INTRAVENOUS
  Filled 2020-03-22: qty 150

## 2020-03-22 MED ORDER — FUROSEMIDE 10 MG/ML IJ SOLN
40.0000 mg | Freq: Once | INTRAMUSCULAR | Status: AC
  Administered 2020-03-22: 40 mg via INTRAVENOUS
  Filled 2020-03-22: qty 4

## 2020-03-22 MED ORDER — METOCLOPRAMIDE HCL 5 MG/ML IJ SOLN
5.0000 mg | Freq: Three times a day (TID) | INTRAMUSCULAR | Status: DC
  Administered 2020-03-22 – 2020-03-25 (×9): 5 mg via INTRAVENOUS
  Filled 2020-03-22 (×11): qty 2

## 2020-03-22 NOTE — Progress Notes (Addendum)
VAST consulted to obtain IV access. Pt currently receiving Vancomycin for unspecified period of time. Paged and spoke with Bianca Crane, PA-C to determine length of treatment with Vanc infusion. He was uncertain, but verbalized he did not think dosing would exceed 6 more days.  VAST RN to assess pt's vasculature and determine if midline would be appropriate. Upon arrival at pt's bedside, noted pt currently with working IV access, although RN reported it stops the IV pump when pt bends her arm.  Unit RN reported that pharmacist told her IV team needed to draw Vanc level prior to administration, but then told RN to start dose and level could be obtained 6/13. This RN checked orders; order for lab to collect Vanc trough 6/13 @ 1230. Educated Bianca Numbers, RN that patient with PIV and not midline; therefore, lab would need to draw Vanc level tomorrow.  Spoke with pt's son who sits with her throughout the day; educated that patient's arm needs to remain somewhat straight for her to receive her IV fluids and meds. Further educated how to assess IV infusion by watching pressure line on IV pump; advised if horizontal line growing to right, to ask his Mom to straighten her arm. Also instructed son to call for RN if IV pump beeping. He repeated instructions back to me to acknowledge understanding. Notified pt's nurse Bianca [name].

## 2020-03-22 NOTE — Progress Notes (Signed)
      [address]       [place] [address]             [phone]         Subjective: No specific c/o  Objective: Vital signs in last 24 hours: Temp:  [98 F (36.7 C)-98.6 F (37 C)] 98.6 F (37 C) (06/12 0411) Pulse Rate:  [92-104] 103 (06/12 0411) Resp:  [17-18] 17 (06/12 0411) BP: (126-149)/(57-63) 149/63 (06/12 0411) SpO2:  [99 %-100 %] 100 % (06/12 0411)  Hemodynamic parameters for last 24 hours:    Intake/Output from previous day: 06/11 0701 - 06/12 0700 In: 738.1 [P.O.:480; IV Piggyback:258.1] Out: 2200 [Urine:2200] Intake/Output this shift: Total I/O In: 100 [P.O.:100] Out: -   General appearance: alert and fatigued Wound: dressings with minor drainage  Lab Results: Recent Labs    03/20/20 0226 03/21/20 0318  WBC 15.2* 15.5*  HGB 7.1* 9.3*  HCT 23.3* 29.8*  PLT 390 440*   BMET:  Recent Labs    03/20/20 0226 03/21/20 0318  NA 135 136  K 3.6 3.4*  CL 103 101  CO2 25 22  GLUCOSE 63* 85  BUN 10 10  CREATININE 0.66  0.68 0.64  0.67  CALCIUM 7.8* 8.0*    PT/INR: No results for input(s): LABPROT, INR in the last 72 hours. ABG    Component Value Date/Time   PHART 7.342 (L) 02/08/2020 2029   HCO3 21.7 02/08/2020 2029   TCO2 23 02/08/2020 2029   ACIDBASEDEF 4.0 (H) 02/08/2020 2029   O2SAT 99.0 02/08/2020 2029   CBG (last 3)  Recent Labs    03/22/20 0408 03/22/20 0751 03/22/20 1117  GLUCAP 211* 181* 142*    Meds Scheduled Meds: . acetaminophen  650 mg Oral Q6H  . aspirin EC  81 mg Oral Daily  . atorvastatin  80 mg Oral Daily  . bisacodyl  10 mg Rectal Daily  . Chlorhexidine Gluconate Cloth  6 each Topical Daily  . clopidogrel  75 mg Oral Daily  . collagenase   Topical Daily  . docusate sodium  100 mg Oral BID  . dronabinol  2.5 mg Oral BID AC  . enoxaparin (LOVENOX) injection  40 mg Subcutaneous Daily  . feeding supplement (GLUCERNA SHAKE)  237 mL Oral BID BM  . feeding supplement (PRO-STAT SUGAR FREE 64)   30 mL Per Tube BID  . insulin aspart  0-15 Units Subcutaneous Q4H  . insulin aspart  0-9 Units Subcutaneous Q4H  . loratadine  10 mg Oral Daily  . LORazepam  0.25 mg Oral Once  . metoCLOPramide (REGLAN) injection  5 mg Intravenous Q8H  . multivitamin with minerals  1 tablet Oral Daily  . polyethylene glycol  17 g Oral Daily  . Ensure Max Protein  11 oz Oral Daily  . sertraline  50 mg Oral Daily  . sodium chloride flush  3 mL Intravenous Q12H   Continuous Infusions: . feeding supplement (OSMOLITE 1.5 CAL) 1,000 mL (03/21/20 1518)  . vancomycin 750 mg (03/21/20 2027)   PRN Meds:.hydrALAZINE, ondansetron **OR** ondansetron (ZOFRAN) IV  Xrays No results found.  Assessment/Plan:  1 stable with nutrition the primary issue in terms of wound healing as noted. Now getting NG feedings. Dressing changes as outlined   LOS: 12 days    Rowe Clack PA-C Pager [phone] 03/22/2020

## 2020-03-22 NOTE — Progress Notes (Signed)
Pharmacy Antibiotic Note  SHAWNDELL [name] is a 74 y.o. female admitted on 03/09/2020 with sternal wound infection.  Pharmacy has been consulted for cefepime dosing.  Today, patient's temperature is 99.5, WBC elevated but trending down. Wound culture pending, but growing serratia; no susceptibilities finalized.  Plan: D/c vancomycin Start cefepime 2g IV q8h Monitor clinical picture, renal function F/U C&S, abx de-escalation, LOT   Height: 5\' 6"  (167.6 cm) Weight: 76.3 kg (168 lb 3.4 oz) (wrong weight first entered) IBW/kg (Calculated) : 59.3  Temp (24hrs), Avg:98.8 F (37.1 C), Min:98.4 F (36.9 C), Max:99.5 F (37.5 C)  Recent Labs  Lab 03/18/20 1543 03/18/20 1740 03/20/20 0226 03/21/20 0318 03/22/20 1415  WBC  --  18.4* 15.2* 15.5* 14.5*  CREATININE 0.72  --  0.66  0.68 0.64  0.67 0.63    Estimated Creatinine Clearance: 63.4 mL/min (by C-G formula based on SCr of 0.63 mg/dL).    Allergies  Allergen Reactions  . Tramadol Nausea And Vomiting    Antimicrobials this admission: Vanc 6/9 >6/12 Cefepime 6/12>  Microbiology results: 6/1 BCx: no growth 5/30 UCx: no growth  6/9 MRSA PCR: neg 6/10 wound cx: pending- few serratia marcescens   8/9, PharmD PGY2 Pharmacy Resident Phone [phone]  03/22/2020   3:33 PM

## 2020-03-22 NOTE — Progress Notes (Signed)
PROGRESS NOTE  Bianca [name] IRC:[id number] DOB: 17-Jul-1946   PCP: Luciano Cutter, PA-C  Patient is from: Home  DOA: 03/09/2020 LOS: 12  Brief Narrative / Interim history: 73 year old female with history of CAD/CABG on 02/08/2020, DM-2, HTN and recent hospitalization from 5/17-5/25 for wound dehiscence and leg swelling presenting with poor p.o. intake and emesis, and admitted for hypotension, AKI and FTT/severe protein calorie malnutrition.  She was started on IV fluid.  Patient had CT head and MRI.  That revealed multiple subacute/chronic infarcts including globus pallidus, right parietal lobe and bilateral frontal lobes.  Bilateral CUS without significant finding.  Echo with mildly reduced EF.  Neurology consulted.  CTA head and neck without flow obstructing stenosis.  Patient was noted to have some purulent discharge from sternotomy wound site.  Started on IV vancomycin.  Cardiothoracic surgery consulted.  Patient continued to do poorly from p.o. intake standpoint.  Eventually started on tube feed via cortrak on 03/21/2020.  Also started on IV Lasix with IV albumin.  Patient remains full code with full scope of care.  Palliative care recommended outpatient follow-up.  Subjective: Seen and examined earlier this morning.  No major events overnight of this morning.  She had an episode of emesis.  Tube feed was briefly held and resumed.  She feels tired.  Denies pain, shortness of breath or nausea but not a great historian.  Objective: Vitals:   03/21/20 0637 03/21/20 1411 03/21/20 2128 03/22/20 0411  BP: (!) 159/68 126/60 (!) 143/57 (!) 149/63  Pulse: (!) 103 92 (!) 104 (!) 103  Resp: 17 18 17 17   Temp: 99.1 F (37.3 C) 98 F (36.7 C) 98.4 F (36.9 C) 98.6 F (37 C)  TempSrc: Oral Oral Oral Oral  SpO2: 100% 100% 99% 100%  Weight:      Height:        Intake/Output Summary (Last 24 hours) at 03/22/2020 1328 Last data filed at 03/22/2020 0900 Gross per 24 hour  Intake  478.08 ml  Output 1100 ml  Net -621.92 ml   Filed Weights   03/09/20 0209 03/19/20 1821  Weight: 64 kg 76.3 kg    Examination:  GENERAL: Frail and chronically ill-appearing.  No apparent distress. HEENT: MMM.  On tube feeding via cortrack NECK: Supple.  No apparent JVD.  RESP:  No IWOB.  Fair aeration bilaterally. CVS:  RRR. Heart sounds normal.  ABD/GI/GU: BS+. Abd soft, NTND.  MSK/EXT:  Moves extremities. No apparent deformity.  2+ pitting edema in RLE. SKIN: Dressing over sternotomy wound and RLE DCI. NEURO: Awake but not alert.  Oriented to self and place.  No apparent focal neuro deficit. PSYCH: Calm but lethargic.   Procedures:  None  Microbiology summarized: COVID-19 PCR negative. Blood cultures negative.  Assessment & Plan: Failure to thrive/severe malnutrition/poor p.o. intake in the setting of subacute CVA in a patient with recent CABG. Significant hypoalbuminemia. It is unclear if hypotension in the setting of poor p.o. intake led to CVA or vice versa. -Started on tube feeding via coretrack on 03/21/2020 -Start Reglan 5 mg every 8 hours for emesis. -Monitor for refeeding syndrome Nutrition Problem: Severe Malnutrition Etiology: acute illness (CAD s/p CABG) Signs/Symptoms: energy intake < or equal to 75% for > or equal to 1 month, mild fat depletion, moderate fat depletion, moderate muscle depletion, mild muscle depletion, percent weight loss Percent weight loss: 19.9 % Interventions: Glucerna shake, MVI, Magic cup, Premier Protein  Hypotension/dehydration in the setting of the above: Echo with EF  of 45 to 50%. TSH and cortisol level within normal. -Continue holding home antihypertensive medications  Subacute/chronic CVA in multiple territories: Likely ischemic insult in the setting of hypotension and atherosclerotic changes.  CTA head and neck without flow obstructing stenosis. -Neurology following-plan for DAPT for 3 weeks followed by single agent -Continue  statin.  CAD/CABG on 02/08/2020 complicated by wound dehiscence and delayed healing of sternotomy wound -Started on IV vancomycin due to some purulent drainage from sternotomy wound. -Cardiothoracic surgery following. -continue aspirin. ARB, diuretics on hold due to hypotension.  -Continue statin and Plavix.  Acute on chronic systolic CHF: Echo with EF of 45 to 50% and RVSP to 25.3.  Unknown diastolic function.  Looks fluid overloaded with significant right lower extremity edema.  Responded well to IV Lasix.  Had 2.2 L UOP/24 hours. -Redose IV Lasix if renal function is stable.  Requested phlebotomy to do morning draws as soon as possible. -Monitor fluid status and renal function  Uncontrolled DM-2 with hypoglycemia: A1c 7.1% on 02/25/2020 Recent Labs    03/22/20 0408 03/22/20 0751 03/22/20 1117  GLUCAP 211* 181* 142*  -SSI-moderate every 4 hours -Adjust insulin as appropriate  Hypokalemia: Resolved. -Recheck and replenish as appropriate  Iron deficiency anemia/anemia of chronic disease: Baseline Hgb 7-8>> 7.3>7.1>1u> 9.3.  Iron sat 6%.  TIBC 123 (low).  Received IV Feraheme and 1 unit of blood transfusion -Monitor H&H  Hyperlipidemia: LDL down from 152-22 in late in 3 months. -Continue statin.  Recent wound dehiscence: SVG site -Continue wound VAC  Leukocytosis/bandemia: Concerned about her sternotomy wound.  Improved. -Antibiotics as above  Constipation:  -Scheduled MiraLAX and Colace daily  Multiple thyroid nodules -Nonemergent thyroid ultrasound for further evaluation  Debility/generalized weakness/physical deconditioning-multifactorial including CAD, hypertension, FTT and anemia -PT/OT  Goal of care-significant comorbidities as above.  Poor prognosis, likely little 6 months.  Still full code which would pose more harm than benefit given his situation.   -Appreciate palliative medicine help   Sacral decubitus: POA Pressure Injury 03/12/20 Coccyx Mid Stage 2  -  Partial thickness loss of dermis presenting as a shallow open injury with a red, pink wound bed without slough. (Active)  03/12/20 0930  Location: Coccyx  Location Orientation: Mid  Staging: Stage 2 -  Partial thickness loss of dermis presenting as a shallow open injury with a red, pink wound bed without slough.  Wound Description (Comments):   Present on Admission:    DVT prophylaxis: Subcu Lovenox Code Status: Full code Family Communication: Updated patient's daughter at bedside on 6/11. Status is: Inpatient  Remains inpatient appropriate because:Hemodynamically unstable, IV treatments appropriate due to intensity of illness or inability to take PO and Inpatient level of care appropriate due to severity of illness   Dispo: The patient is from: Home              Anticipated d/c is to: Home              Anticipated d/c date is: > 3 days              Patient currently is not medically stable to d/c.       Consultants:  Neurology-signed off. Palliative medicine Cardiothoracic surgery   Sch Meds:  Scheduled Meds: . acetaminophen  650 mg Oral Q6H  . aspirin EC  81 mg Oral Daily  . atorvastatin  80 mg Oral Daily  . bisacodyl  10 mg Rectal Daily  . Chlorhexidine Gluconate Cloth  6 each Topical Daily  .  clopidogrel  75 mg Oral Daily  . collagenase   Topical Daily  . docusate sodium  100 mg Oral BID  . dronabinol  2.5 mg Oral BID AC  . enoxaparin (LOVENOX) injection  40 mg Subcutaneous Daily  . feeding supplement (GLUCERNA SHAKE)  237 mL Oral BID BM  . feeding supplement (PRO-STAT SUGAR FREE 64)  30 mL Per Tube BID  . insulin aspart  0-15 Units Subcutaneous Q4H  . insulin aspart  0-9 Units Subcutaneous Q4H  . loratadine  10 mg Oral Daily  . LORazepam  0.25 mg Oral Once  . metoCLOPramide (REGLAN) injection  5 mg Intravenous Q8H  . multivitamin with minerals  1 tablet Oral Daily  . polyethylene glycol  17 g Oral Daily  . Ensure Max Protein  11 oz Oral Daily  .  sertraline  50 mg Oral Daily  . sodium chloride flush  3 mL Intravenous Q12H   Continuous Infusions: . feeding supplement (OSMOLITE 1.5 CAL) 1,000 mL (03/21/20 1518)  . vancomycin 750 mg (03/22/20 1236)   PRN Meds:.hydrALAZINE, ondansetron **OR** ondansetron (ZOFRAN) IV  Antimicrobials: Anti-infectives (From admission, onward)   Start     Dose/Rate Route Frequency Ordered Stop   03/22/20 1300  vancomycin (VANCOREADY) IVPB 750 mg/150 mL     Discontinue     750 mg 150 mL/hr over 60 Minutes Intravenous Every 12 hours 03/22/20 1231     03/20/20 0830  vancomycin (VANCOREADY) IVPB 750 mg/150 mL  Status:  Discontinued        750 mg 150 mL/hr over 60 Minutes Intravenous Every 12 hours 03/19/20 1848 03/22/20 1231   03/19/20 1915  vancomycin (VANCOREADY) IVPB 1500 mg/300 mL        1,500 mg 150 mL/hr over 120 Minutes Intravenous  Once 03/19/20 1848 03/19/20 2255       I have personally reviewed the following labs and images: CBC: Recent Labs  Lab 03/18/20 1740 03/20/20 0226 03/21/20 0318  WBC 18.4* 15.2* 15.5*  NEUTROABS 15.9* 12.7*  --   HGB 7.3* 7.1* 9.3*  HCT 24.3* 23.3* 29.8*  MCV 88.0 87.3 87.4  PLT 349 390 440*   BMP &GFR Recent Labs  Lab 03/18/20 1543 03/19/20 1020 03/20/20 0226 03/21/20 0318  NA 137  --  135 136  K 3.5  --  3.6 3.4*  CL 105  --  103 101  CO2 22  --  25 22  GLUCOSE 111*  --  63* 85  BUN 12  --  10 10  CREATININE 0.72  --  0.66  0.68 0.64  0.67  CALCIUM 7.9*  --  7.8* 8.0*  MG  --  1.9 2.0 2.0  PHOS  --  3.3 3.1 3.3   Estimated Creatinine Clearance: 63.4 mL/min (by C-G formula based on SCr of 0.67 mg/dL). Liver & Pancreas: Recent Labs  Lab 03/20/20 0226 03/21/20 0318  ALBUMIN 1.5* 1.6*   No results for input(s): LIPASE, AMYLASE in the last 168 hours. No results for input(s): AMMONIA in the last 168 hours. Diabetic: No results for input(s): HGBA1C in the last 72 hours. Recent Labs  Lab 03/21/20 2124 03/21/20 2359 03/22/20 0408  03/22/20 0751 03/22/20 1117  GLUCAP 234* 245* 211* 181* 142*   Cardiac Enzymes: No results for input(s): CKTOTAL, CKMB, CKMBINDEX, TROPONINI in the last 168 hours. No results for input(s): PROBNP in the last 8760 hours. Coagulation Profile: No results for input(s): INR, PROTIME in the last 168 hours. Thyroid Function Tests: Recent  Labs    03/20/20 0226  TSH 0.578   Lipid Profile: No results for input(s): CHOL, HDL, LDLCALC, TRIG, CHOLHDL, LDLDIRECT in the last 72 hours. Anemia Panel: Recent Labs    03/21/20 0807  VITAMINB12 509  FOLATE 13.8  FERRITIN 1,373*  TIBC 123*  IRON 8*  RETICCTPCT 2.3   Urine analysis:    Component Value Date/Time   COLORURINE YELLOW 03/09/2020 0635   APPEARANCEUR HAZY (A) 03/09/2020 0635   LABSPEC 1.020 03/09/2020 0635   PHURINE 5.0 03/09/2020 0635   GLUCOSEU NEGATIVE 03/09/2020 0635   HGBUR NEGATIVE 03/09/2020 0635   BILIRUBINUR NEGATIVE 03/09/2020 0635   BILIRUBINUR neg 02/20/2015 1040   KETONESUR NEGATIVE 03/09/2020 0635   PROTEINUR 100 (A) 03/09/2020 0635   UROBILINOGEN 0.2 02/20/2015 1040   UROBILINOGEN 1.0 02/04/2015 1350   NITRITE NEGATIVE 03/09/2020 0635   LEUKOCYTESUR NEGATIVE 03/09/2020 0635   Sepsis Labs: Invalid input(s): PROCALCITONIN, [place]  Microbiology: Recent Results (from the past 240 hour(s))  MRSA PCR Screening     Status: None   Collection Time: 03/19/20 11:08 PM   Specimen: Nasal Mucosa; Nasopharyngeal  Result Value Ref Range Status   MRSA by PCR NEGATIVE NEGATIVE Final    Comment:        The GeneXpert MRSA Assay (FDA approved for NASAL specimens only), is one component of a comprehensive MRSA colonization surveillance program. It is not intended to diagnose MRSA infection nor to guide or monitor treatment for MRSA infections. Performed at [facility], [place] [address], [place], [place] [address]   Aerobic Culture (superficial specimen)     Status: None (Preliminary result)   Collection  Time: 03/20/20  5:03 PM   Specimen: Wound  Result Value Ref Range Status   Specimen Description WOUND STERNUM  Final   Special Requests NONE  Final   Gram Stain   Final    FEW WBC PRESENT, PREDOMINANTLY PMN FEW GRAM POSITIVE COCCI IN CLUSTERS RARE GRAM POSITIVE RODS    Culture   Final    FEW GRAM NEGATIVE RODS IDENTIFICATION AND SUSCEPTIBILITIES TO FOLLOW Performed at [facility], [place] [address], [place], [place] [address]    Report Status PENDING  Incomplete    Radiology Studies: No results found.    Roderick Calo T. Wright  If 7PM-7AM, please contact night-coverage [email or web address] Password [email or web address] 03/22/2020, 1:28 PM

## 2020-03-23 ENCOUNTER — Inpatient Hospital Stay (HOSPITAL_COMMUNITY): Payer: Medicare Other

## 2020-03-23 DIAGNOSIS — M60009 Infective myositis, unspecified site: Secondary | ICD-10-CM

## 2020-03-23 DIAGNOSIS — F5089 Other specified eating disorder: Secondary | ICD-10-CM

## 2020-03-23 LAB — CBC
HCT: 25.3 % — ABNORMAL LOW (ref 36.0–46.0)
Hemoglobin: 7.8 g/dL — ABNORMAL LOW (ref 12.0–15.0)
MCH: 27 pg (ref 26.0–34.0)
MCHC: 30.8 g/dL (ref 30.0–36.0)
MCV: 87.5 fL (ref 80.0–100.0)
Platelets: 470 10*3/uL — ABNORMAL HIGH (ref 150–400)
RBC: 2.89 MIL/uL — ABNORMAL LOW (ref 3.87–5.11)
RDW: 17.7 % — ABNORMAL HIGH (ref 11.5–15.5)
WBC: 14.6 10*3/uL — ABNORMAL HIGH (ref 4.0–10.5)
nRBC: 0 % (ref 0.0–0.2)

## 2020-03-23 LAB — GLUCOSE, CAPILLARY
Glucose-Capillary: 273 mg/dL — ABNORMAL HIGH (ref 70–99)
Glucose-Capillary: 312 mg/dL — ABNORMAL HIGH (ref 70–99)
Glucose-Capillary: 322 mg/dL — ABNORMAL HIGH (ref 70–99)
Glucose-Capillary: 312 mg/dL — ABNORMAL HIGH (ref 70–99)
Glucose-Capillary: 246 mg/dL — ABNORMAL HIGH (ref 70–99)

## 2020-03-23 LAB — MAGNESIUM: Magnesium: 2 mg/dL (ref 1.7–2.4)

## 2020-03-23 LAB — RENAL FUNCTION PANEL
Albumin: 1.8 g/dL — ABNORMAL LOW (ref 3.5–5.0)
Anion gap: 8 (ref 5–15)
BUN: 14 mg/dL (ref 8–23)
CO2: 27 mmol/L (ref 22–32)
Calcium: 7.8 mg/dL — ABNORMAL LOW (ref 8.9–10.3)
Chloride: 104 mmol/L (ref 98–111)
Creatinine, Ser: 0.75 mg/dL (ref 0.44–1.00)
GFR calc Af Amer: >60 mL/min (ref >60)
GFR calc non Af Amer: >60 mL/min (ref >60)
Glucose, Bld: 272 mg/dL — ABNORMAL HIGH (ref 70–99)
Phosphorus: 1.9 mg/dL — ABNORMAL LOW (ref 2.5–4.6)
Potassium: 3.7 mmol/L (ref 3.5–5.1)
Sodium: 139 mmol/L (ref 135–145)

## 2020-03-23 MED ORDER — INSULIN GLARGINE 100 UNIT/ML ~~LOC~~ SOLN
5.0000 [IU] | Freq: Two times a day (BID) | SUBCUTANEOUS | Status: DC
  Administered 2020-03-23 – 2020-03-25 (×5): 5 [IU] via SUBCUTANEOUS
  Filled 2020-03-23 (×7): qty 0.05

## 2020-03-23 MED ORDER — POTASSIUM PHOSPHATES 15 MMOLE/5ML IV SOLN
20.0000 mmol | Freq: Once | INTRAVENOUS | Status: AC
  Administered 2020-03-23: 20 mmol via INTRAVENOUS
  Filled 2020-03-23: qty 6.67

## 2020-03-23 MED ORDER — IOHEXOL 300 MG/ML  SOLN
100.0000 mL | Freq: Once | INTRAMUSCULAR | Status: AC | PRN
  Administered 2020-03-23: 100 mL via INTRAVENOUS

## 2020-03-23 MED ORDER — VANCOMYCIN HCL IN DEXTROSE 1-5 GM/200ML-% IV SOLN
1000.0000 mg | Freq: Two times a day (BID) | INTRAVENOUS | Status: DC
  Administered 2020-03-23 – 2020-03-25 (×5): 1000 mg via INTRAVENOUS
  Filled 2020-03-23 (×6): qty 200

## 2020-03-23 MED ORDER — INSULIN ASPART 100 UNIT/ML ~~LOC~~ SOLN
0.0000 [IU] | SUBCUTANEOUS | Status: DC
  Administered 2020-03-23: 7 [IU] via SUBCUTANEOUS
  Administered 2020-03-23: 15 [IU] via SUBCUTANEOUS
  Administered 2020-03-24: 3 [IU] via SUBCUTANEOUS
  Administered 2020-03-24: 4 [IU] via SUBCUTANEOUS
  Administered 2020-03-24: 7 [IU] via SUBCUTANEOUS
  Administered 2020-03-24 (×2): 3 [IU] via SUBCUTANEOUS
  Administered 2020-03-24: 4 [IU] via SUBCUTANEOUS
  Administered 2020-03-25: 3 [IU] via SUBCUTANEOUS
  Administered 2020-03-25: 4 [IU] via SUBCUTANEOUS
  Administered 2020-03-25: 3 [IU] via SUBCUTANEOUS
  Administered 2020-03-25: 7 [IU] via SUBCUTANEOUS

## 2020-03-23 NOTE — Consult Note (Signed)
Reason for Consult/Chief Complaint:  Consultant: Cyndia Skeeters, MD  Bianca [name] is an 74 y.o. female.   HPI: 24F s/p CABG, readmitted for sternal wound infection and failure to thrive. Surgical consult for pyomyofasciitis identified on CT scan of the RLE performed for erythema of the R thigh.   Past Medical History:  Diagnosis Date  . CAD (coronary artery disease) 01/31/2020   CABG  . Diabetes mellitus without complication ([place])   . Dyspnea   . Hypertension   . Hypertensive urgency 01/06/2020    Past Surgical History:  Procedure Laterality Date  . CORONARY ARTERY BYPASS GRAFT N/A 02/08/2020   Procedure: CORONARY ARTERY BYPASS GRAFTING (CABG) TIMES FOUR USING LEFT INTERNAL MAMMARY ARTERY AND RIGHT SAPHENOUS LEG VEIN HARVESTED ENDOSCOPICALLY;  Surgeon: Lajuana Matte, MD;  Location: [facility];  Service: Open Heart Surgery;  Laterality: N/A;  . LEFT HEART CATH AND CORONARY ANGIOGRAPHY N/A 01/31/2020   Procedure: LEFT HEART CATH AND CORONARY ANGIOGRAPHY;  Surgeon: Lorretta Harp, MD;  Location: [facility];  Service: Cardiovascular;  Laterality: N/A;  . TEE WITHOUT CARDIOVERSION N/A 02/08/2020   Procedure: TRANSESOPHAGEAL ECHOCARDIOGRAM (TEE);  Surgeon: Lajuana Matte, MD;  Location: [facility];  Service: Open Heart Surgery;  Laterality: N/A;    Family History  Problem Relation Age of Onset  . Diabetes Mother   . Diabetes Sister   . Diabetes Brother   . Heart disease Brother     Social History:  reports that she quit smoking about 2 months ago. Her smoking use included cigarettes. She has a 28.00 pack-year smoking history. She has never used smokeless tobacco. She reports that she does not drink alcohol and does not use drugs.  Allergies:  Allergies  Allergen Reactions  . Tramadol Nausea And Vomiting    Medications: I have reviewed the patient's current medications.  Results for orders placed or performed during the hospital encounter of 03/09/20 (from the past  48 hour(s))  Glucose, capillary     Status: Abnormal   Collection Time: 03/21/20  9:24 PM  Result Value Ref Range   Glucose-Capillary 234 (H) 70 - 99 mg/dL    Comment: Glucose reference range applies only to samples taken after fasting for at least 8 hours.  Glucose, capillary     Status: Abnormal   Collection Time: 03/21/20 11:59 PM  Result Value Ref Range   Glucose-Capillary 245 (H) 70 - 99 mg/dL    Comment: Glucose reference range applies only to samples taken after fasting for at least 8 hours.  Glucose, capillary     Status: Abnormal   Collection Time: 03/22/20  4:08 AM  Result Value Ref Range   Glucose-Capillary 211 (H) 70 - 99 mg/dL    Comment: Glucose reference range applies only to samples taken after fasting for at least 8 hours.  Glucose, capillary     Status: Abnormal   Collection Time: 03/22/20  7:51 AM  Result Value Ref Range   Glucose-Capillary 181 (H) 70 - 99 mg/dL    Comment: Glucose reference range applies only to samples taken after fasting for at least 8 hours.  Glucose, capillary     Status: Abnormal   Collection Time: 03/22/20 11:17 AM  Result Value Ref Range   Glucose-Capillary 142 (H) 70 - 99 mg/dL    Comment: Glucose reference range applies only to samples taken after fasting for at least 8 hours.  CBC     Status: Abnormal   Collection Time: 03/22/20  2:15 PM  Result Value Ref Range   WBC 14.5 (H) 4.0 - 10.5 K/uL   RBC 2.90 (L) 3.87 - 5.11 MIL/uL   Hemoglobin 7.8 (L) 12.0 - 15.0 g/dL   HCT 42.3 (L) 36 - 46 %   MCV 86.6 80.0 - 100.0 fL   MCH 26.9 26.0 - 34.0 pg   MCHC 31.1 30.0 - 36.0 g/dL   RDW 53.6 (H) 14.4 - 31.5 %   Platelets 448 (H) 150 - 400 K/uL   nRBC 0.0 0.0 - 0.2 %    Comment: Performed at [facility], [address], [place], [place] [address]  Magnesium     Status: None   Collection Time: 03/22/20  2:15 PM  Result Value Ref Range   Magnesium 2.0 1.7 - 2.4 mg/dL    Comment: Performed at [facility], [address],  [place], [place] [address]  Renal function panel     Status: Abnormal   Collection Time: 03/22/20  2:15 PM  Result Value Ref Range   Sodium 137 135 - 145 mmol/L   Potassium 3.5 3.5 - 5.1 mmol/L   Chloride 102 98 - 111 mmol/L   CO2 26 22 - 32 mmol/L   Glucose, Bld 229 (H) 70 - 99 mg/dL    Comment: Glucose reference range applies only to samples taken after fasting for at least 8 hours.   BUN 12 8 - 23 mg/dL   Creatinine, Ser 0.93 0.44 - 1.00 mg/dL   Calcium 7.9 (L) 8.9 - 10.3 mg/dL   Phosphorus 2.2 (L) 2.5 - 4.6 mg/dL   Albumin 2.0 (L) 3.5 - 5.0 g/dL   GFR calc non Af Amer >60 >60 mL/min   GFR calc Af Amer >60 >60 mL/min   Anion gap 9 5 - 15    Comment: Performed at [facility], [address], [place], [place] [address]  Glucose, capillary     Status: Abnormal   Collection Time: 03/22/20  4:07 PM  Result Value Ref Range   Glucose-Capillary 215 (H) 70 - 99 mg/dL    Comment: Glucose reference range applies only to samples taken after fasting for at least 8 hours.  Glucose, capillary     Status: Abnormal   Collection Time: 03/22/20  8:03 PM  Result Value Ref Range   Glucose-Capillary 293 (H) 70 - 99 mg/dL    Comment: Glucose reference range applies only to samples taken after fasting for at least 8 hours.  Glucose, capillary     Status: Abnormal   Collection Time: 03/22/20 11:51 PM  Result Value Ref Range   Glucose-Capillary 207 (H) 70 - 99 mg/dL    Comment: Glucose reference range applies only to samples taken after fasting for at least 8 hours.  Renal function panel     Status: Abnormal   Collection Time: 03/23/20  3:50 AM  Result Value Ref Range   Sodium 139 135 - 145 mmol/L   Potassium 3.7 3.5 - 5.1 mmol/L   Chloride 104 98 - 111 mmol/L   CO2 27 22 - 32 mmol/L   Glucose, Bld 272 (H) 70 - 99 mg/dL    Comment: Glucose reference range applies only to samples taken after fasting for at least 8 hours.   BUN 14 8 - 23 mg/dL   Creatinine, Ser 4.58 0.44 - 1.00 mg/dL   Calcium  7.8 (L) 8.9 - 10.3 mg/dL   Phosphorus 1.9 (L) 2.5 - 4.6 mg/dL  Albumin 1.8 (L) 3.5 - 5.0 g/dL   GFR calc non Af Amer >60 >60 mL/min   GFR calc Af Amer >60 >60 mL/min   Anion gap 8 5 - 15    Comment: Performed at [facility], [address] [place], [place] [address]  Magnesium     Status: None   Collection Time: 03/23/20  3:50 AM  Result Value Ref Range   Magnesium 2.0 1.7 - 2.4 mg/dL    Comment: Performed at [facility], [address] [place], [place] [address]  CBC     Status: Abnormal   Collection Time: 03/23/20  3:50 AM  Result Value Ref Range   WBC 14.6 (H) 4.0 - 10.5 K/uL   RBC 2.89 (L) 3.87 - 5.11 MIL/uL   Hemoglobin 7.8 (L) 12.0 - 15.0 g/dL   HCT 31.5 (L) 36 - 46 %   MCV 87.5 80.0 - 100.0 fL   MCH 27.0 26.0 - 34.0 pg   MCHC 30.8 30.0 - 36.0 g/dL   RDW 40.0 (H) 86.7 - 61.9 %   Platelets 470 (H) 150 - 400 K/uL   nRBC 0.0 0.0 - 0.2 %    Comment: Performed at [facility], [address] [place], [place] [address]  Glucose, capillary     Status: Abnormal   Collection Time: 03/23/20  3:58 AM  Result Value Ref Range   Glucose-Capillary 273 (H) 70 - 99 mg/dL    Comment: Glucose reference range applies only to samples taken after fasting for at least 8 hours.  Glucose, capillary     Status: Abnormal   Collection Time: 03/23/20  8:07 AM  Result Value Ref Range   Glucose-Capillary 312 (H) 70 - 99 mg/dL    Comment: Glucose reference range applies only to samples taken after fasting for at least 8 hours.  Glucose, capillary     Status: Abnormal   Collection Time: 03/23/20 12:07 PM  Result Value Ref Range   Glucose-Capillary 322 (H) 70 - 99 mg/dL    Comment: Glucose reference range applies only to samples taken after fasting for at least 8 hours.  Glucose, capillary     Status: Abnormal   Collection Time: 03/23/20  3:43 PM  Result Value Ref Range   Glucose-Capillary 312 (H) 70 - 99 mg/dL    Comment: Glucose reference range applies only to samples taken  after fasting for at least 8 hours.    CT EXTREMITY LOWER RIGHT W CONTRAST  Result Date: 03/23/2020 CLINICAL DATA:  Right lower leg swelling. EXAM: CT OF THE LOWER RIGHT EXTREMITY WITH CONTRAST TECHNIQUE: Multidetector CT imaging of the lower right extremity was performed according to the standard protocol following intravenous contrast administration. COMPARISON:  None. CONTRAST:  OMNIPAQUE IOHEXOL 300 MG/ML  SOLN FINDINGS: Bones/Joint/Cartilage No bony destruction or periosteal reaction. No fracture or dislocation. Mild tricompartmental joint space narrowing of the knee. No joint effusion. Osteopenia. Ligaments Ligaments are suboptimally evaluated by CT. Muscles and Tendons Prominent fluid within and loss of the normal architecture involving the visualized distal semimembranosus and sartorius muscles. Prominent perifascial fluid within the posterior compartment of the distal thigh extending below the knee, with areas of peripheral enhancement (series 5, image 6; series 9, image 73). Remaining muscles are unremarkable. The visualized tendons are grossly intact. Soft tissue Circumferential soft tissue swelling of the lower leg. Soft tissue defect along the medial proximal lower leg. No subcutaneous emphysema. No soft tissue mass. IMPRESSION: 1. Prominent  fluid within and loss of the normal architecture involving the visualized distal semimembranosus and sartorius muscles. Associated extensive perifascial fluid within the posterior compartment of the distal thigh and extending below the knee with areas of peripheral enhancement. Findings are concerning for pyomyofascitis. 2. Circumferential soft tissue swelling of the lower leg, consistent with cellulitis. 3. No acute osseous abnormality. Electronically Signed   By: Obie Dredge M.D.   On: 03/23/2020 13:45    ROS 10 point review of systems is negative except as listed above in HPI.   Physical Exam Blood pressure (!) 145/66, pulse 99, temperature  98.6 F (37 C), temperature source Oral, resp. rate 20, height 5\' 6"  (1.676 m), weight 76.3 kg, SpO2 97 %. Constitutional: well-developed, malnourished HEENT: pupils equal, round, reactive to light, 37mm b/l, moist conjunctiva, external inspection of ears and nose normal, hearing intact Oropharynx: normal oropharyngeal mucosa Neck: no thyromegaly, trachea midline, no midline cervical tenderness to palpation Chest: breath sounds equal bilaterally, normal respiratory effort, no midline or lateral chest wall tenderness to palpation/deformity, midline sternal wound dressed Abdomen: soft, NT, no bruising, no hepatosplenomegaly GU: normal female genitalia  Back: no wounds, no thoracic/lumbar spine tenderness to palpation, no thoracic/lumbar spine stepoffs Rectal: deferred Extremities: motor and sensation intact to bilateral UE and LE, + peripheral edema of the RLE with erythema of the right medial thigh, half-dollar sized wound just distal to the medial aspect of the right knee with pink granulation tissue and no exudate MSK: unable to assess gait/station, no clubbing/cyanosis of fingers/toes Skin: warm, dry, no rashes Psych: oriented to self, time (reports president as "[place]"), place ("[facility]"), and situation ("had a CABG")    Assessment/Plan: 13F s/p CABG with failure to thrive and multiple non-healing wounds, now with pyomyofasciitis of the R thigh on CT.   Discussed with the patient, who declines surgery. I am in agreement with this decision, as she and I discussed the high risk of morbidity and mortality of surgery itself as well as the high peri-operative risks. We discussed the morbidity of a large surgical wound, the need for twice daily dressing changes that would likely be painful, and given her poor nutrition, the high likelihood of non-healing or poor healing. I also discussed these risks with the patient's daughter, Bianca [name], and her son, Bianca [name], on a three way phone call.  They are respecting their mother's decision and are in agreement that she would be unlikely to survive another surgery. They understand that for this type of infection, without surgery, this is most likely a terminal illness. We discussed prolongation of life with antibiotics, which they are open to, but would like to focus on ensuring that she is comfortable. They would like to explore home hospice, which I believe is appropriate. I have notified the primary attending as well as the palliative care team who is already following the patient. With no desire for surgical intervention, we will sign off at this time. Please re-consult if we can be of any further assistance.    Montine Circle, MD General and Trauma Surgery [facility]

## 2020-03-23 NOTE — Progress Notes (Signed)
PROGRESS NOTE  Bianca [name] [facility]:[id number] DOB: 1945/12/24   PCP: Julian Hy, PA-C  Patient is from: Home  DOA: 03/09/2020 LOS: 45  Brief Narrative / Interim history: 74 year old female with history of CAD/CABG on 02/08/2020, DM-2, HTN and recent hospitalization from 5/17-5/25 for wound dehiscence and leg swelling presenting with poor p.o. intake and emesis, and admitted for hypotension, AKI and FTT/severe protein calorie malnutrition.  She was started on IV fluid.  Patient had CT head and MRI.  That revealed multiple subacute/chronic infarcts including globus pallidus, right parietal lobe and bilateral frontal lobes.  Bilateral CUS without significant finding.  Echo with mildly reduced EF.  Neurology consulted.  CTA head and neck without flow obstructing stenosis.  Patient was noted to have some purulent discharge from sternotomy wound site.  Started on IV vancomycin.  Cardiothoracic surgery consulted.  Wound culture obtained and grew Serratia marcescens resistant to Ancef and gentamicin.  She was transitioned to IV cefepime per pharmacy recommendation.  Patient continued to do poorly from p.o. intake standpoint.  Eventually started on tube feed via cortrak on 03/21/2020.  Also started on IV Lasix with IV albumin.  Patient remains full code with full scope of care.  Palliative care following.  Subjective: Seen and examined earlier this morning.  Feels weak and nauseous but no emesis.  She had few bites of donuts and other meds this morning.  Daughter thinks she is better today.  Denies pain and shortness of breath.  Daughter and RN noted right thigh swelling and erythema just below the groin line.   Objective: Vitals:   03/22/20 1410 03/22/20 2031 03/23/20 0410 03/23/20 1335  BP: (!) 152/72 (!) 150/71 (!) 149/69 (!) 145/66  Pulse: (!) 106 (!) 102 99 99  Resp: 19 18 18 20   Temp: 99.5 F (37.5 C) 99.4 F (37.4 C) 99.1 F (37.3 C) 98.6 F (37 C)  TempSrc: Axillary Oral Oral  Oral  SpO2: 97% 98% 97% 97%  Weight:      Height:        Intake/Output Summary (Last 24 hours) at 03/23/2020 1403 Last data filed at 03/23/2020 0532 Gross per 24 hour  Intake --  Output 2050 ml  Net -2050 ml   Filed Weights   03/09/20 0209 03/19/20 1821  Weight: 64 kg 76.3 kg    Examination:  GENERAL: Frail and chronically ill-appearing.  No apparent distress. HEENT: MMM.  Continue feeding via Cortrak. NECK: Supple.  No apparent JVD.  RESP: On room air.  No IWOB.  Fair aeration bilaterally. CVS:  RRR. Heart sounds normal.  ABD/GI/GU: BS+. Abd soft, NTND.  MSK/EXT: 2+ pitting edema in RLE with firm indurated skin in the right thigh proximally SKIN: Dressing over her sternotomy wound and RLE DCI. Right thing swelling and erythema as above NEURO: Awake, alert and oriented to self, family and place.  No apparent focal neuro deficit. PSYCH: Calm.  Somewhat flat affect.  Looks weak.   Procedures:  None  Microbiology summarized: COVID-19 PCR negative. Blood cultures negative. Sternotomy wound culture with Serratia marcescens resistant to Ancef and gentamicin.  Assessment & Plan: Failure to thrive/severe malnutrition/poor p.o. intake in the setting of subacute CVA in a patient with recent CABG. Significant hypoalbuminemia. It is unclear if hypotension in the setting of poor p.o. intake led to CVA or vice versa. -Started on tube feeding via coretrack on 03/21/2020 -Start Reglan 5 mg every 8 hours for nausea and emesis. -Monitor for refeeding syndrome Nutrition Problem: Severe Malnutrition Etiology:  acute illness (CAD s/p CABG) Signs/Symptoms: energy intake < or equal to 75% for > or equal to 1 month, mild fat depletion, moderate fat depletion, moderate muscle depletion, mild muscle depletion, percent weight loss Percent weight loss: 19.9 % Interventions: Glucerna shake, MVI, Magic cup, Premier Protein  Right thigh pyomyositis/cellulitis-RLE CT with contrast concerning for  pyomyositis with cellulitis -Continue IV cefepime  -Add back IV vancomycin -General surgery consult  CAD/CABG on 02/08/2020 complicated by RLE infection, RLE wound dehiscence and delayed healing of sternotomy wound -Cardiothoracic surgery following. -Antibiotics as above. -continue aspirin, Plavix and a statin.  -ARB, diuretics on hold due to hypotension.   Hypotension/dehydration in the setting of the above: Echo with EF of 45 to 50%. TSH and cortisol level within normal. -Continue holding home antihypertensive medications -Diuretics as needed  Subacute/chronic CVA in multiple territories: Likely ischemic insult in the setting of hypotension and atherosclerotic changes.  CTA head and neck without flow obstructing stenosis. -Neurology following-plan for DAPT for 3 weeks followed by single agent -Continue statin.  Acute on chronic systolic CHF: Echo with EF of 45 to 50% and RVSP to 25.3.  Unknown diastolic function.  Looks fluid overloaded with significant RLE edema.  Responded well to IV Lasix.  Had 2.2 L UOP/24 hours. Cr slightly up to  -Hold diuretics today given plan for RLE CT with contrast -Monitor fluid status, renal function and electrolytes  Uncontrolled DM-2 with hypoglycemia: A1c 7.1% on 02/25/2020 Recent Labs    03/23/20 0358 03/23/20 0807 03/23/20 1207  GLUCAP 273* 312* 322*  -Add Lantus 5 units twice daily -Increase SSI to high -Further adjustment as appropriate  Hypokalemia/hypophosphatemia:  -Potassium phosphate 20 mmol x 1  Iron deficiency anemia/anemia of chronic disease: Baseline Hgb 7-8>> 7.3>7.1>1u> 9.3 (exaggerated)>7.8>7.8  Iron sat 6%.  TIBC 123 (low).  Received IV Feraheme and 1 unit of blood transfusion -Monitor H&H  Hyperlipidemia: LDL down from 152-22 in late in 3 months. -Continue statin.  Leukocytosis/bandemia: Likely due to infectious process as above. -Antibiotics as above  Constipation:  -Scheduled MiraLAX and Colace daily  Multiple  thyroid nodules -Nonemergent thyroid ultrasound for further evaluation  Debility/generalized weakness/physical deconditioning-multifactorial including CAD, hypertension, FTT and anemia -PT/OT  Goal of care-significant comorbidities as above.  Poor prognosis, likely little 6 months.  Still full code which would pose more harm than benefit given his situation.   -Appreciate palliative medicine help   Sacral decubitus: POA Pressure Injury 03/12/20 Coccyx Mid Stage 2 -  Partial thickness loss of dermis presenting as a shallow open injury with a red, pink wound bed without slough. (Active)  03/12/20 0930  Location: Coccyx  Location Orientation: Mid  Staging: Stage 2 -  Partial thickness loss of dermis presenting as a shallow open injury with a red, pink wound bed without slough.  Wound Description (Comments):   Present on Admission:    DVT prophylaxis: Subcu Lovenox Code Status: Full code Family Communication: Updated patient's daughter and son at bedside. Status is: Inpatient  Remains inpatient appropriate because:Hemodynamically unstable, IV treatments appropriate due to intensity of illness or inability to take PO and Inpatient level of care appropriate due to severity of illness   Dispo: The patient is from: Home              Anticipated d/c is to: Home              Anticipated d/c date is: > 3 days  Patient currently is not medically stable to d/c.       Consultants:  Neurology-signed off. Palliative medicine Cardiothoracic surgery General surgery   Sch Meds:  Scheduled Meds: . acetaminophen  650 mg Oral Q6H  . aspirin EC  81 mg Oral Daily  . atorvastatin  80 mg Oral Daily  . bisacodyl  10 mg Rectal Daily  . Chlorhexidine Gluconate Cloth  6 each Topical Daily  . clopidogrel  75 mg Oral Daily  . collagenase   Topical Daily  . docusate sodium  100 mg Oral BID  . dronabinol  2.5 mg Oral BID AC  . enoxaparin (LOVENOX) injection  40 mg Subcutaneous Daily    . feeding supplement (GLUCERNA SHAKE)  237 mL Oral BID BM  . feeding supplement (PRO-STAT SUGAR FREE 64)  30 mL Per Tube BID  . insulin aspart  0-20 Units Subcutaneous Q4H  . insulin glargine  5 Units Subcutaneous BID  . loratadine  10 mg Oral Daily  . LORazepam  0.25 mg Oral Once  . metoCLOPramide (REGLAN) injection  5 mg Intravenous Q8H  . multivitamin with minerals  1 tablet Oral Daily  . polyethylene glycol  17 g Oral Daily  . Ensure Max Protein  11 oz Oral Daily  . sertraline  50 mg Oral Daily  . sodium chloride flush  3 mL Intravenous Q12H   Continuous Infusions: . ceFEPime (MAXIPIME) IV 2 g (03/23/20 0510)  . feeding supplement (OSMOLITE 1.5 CAL) 1,000 mL (03/23/20 1050)  . potassium PHOSPHATE IVPB (in mmol) 20 mmol (03/23/20 1046)   PRN Meds:.hydrALAZINE, ondansetron **OR** ondansetron (ZOFRAN) IV  Antimicrobials: Anti-infectives (From admission, onward)   Start     Dose/Rate Route Frequency Ordered Stop   03/22/20 1530  ceFEPIme (MAXIPIME) 2 g in sodium chloride 0.9 % 100 mL IVPB     Discontinue     2 g 200 mL/hr over 30 Minutes Intravenous Every 8 hours 03/22/20 1529     03/22/20 1300  vancomycin (VANCOREADY) IVPB 750 mg/150 mL  Status:  Discontinued        750 mg 150 mL/hr over 60 Minutes Intravenous Every 12 hours 03/22/20 1231 03/22/20 1529   03/20/20 0830  vancomycin (VANCOREADY) IVPB 750 mg/150 mL  Status:  Discontinued        750 mg 150 mL/hr over 60 Minutes Intravenous Every 12 hours 03/19/20 1848 03/22/20 1231   03/19/20 1915  vancomycin (VANCOREADY) IVPB 1500 mg/300 mL        1,500 mg 150 mL/hr over 120 Minutes Intravenous  Once 03/19/20 1848 03/19/20 2255       I have personally reviewed the following labs and images: CBC: Recent Labs  Lab 03/18/20 1740 03/20/20 0226 03/21/20 0318 03/22/20 1415 03/23/20 0350  WBC 18.4* 15.2* 15.5* 14.5* 14.6*  NEUTROABS 15.9* 12.7*  --   --   --   HGB 7.3* 7.1* 9.3* 7.8* 7.8*  HCT 24.3* 23.3* 29.8* 25.1* 25.3*   MCV 88.0 87.3 87.4 86.6 87.5  PLT 349 390 440* 448* 470*   BMP &GFR Recent Labs  Lab 03/18/20 1543 03/19/20 1020 03/20/20 0226 03/21/20 0318 03/22/20 1415 03/23/20 0350  NA 137  --  135 136 137 139  K 3.5  --  3.6 3.4* 3.5 3.7  CL 105  --  103 101 102 104  CO2 22  --  25 22 26 27   GLUCOSE 111*  --  63* 85 229* 272*  BUN 12  --  10 10  12 14  CREATININE 0.72  --  0.66  0.68 0.64  0.67 0.63 0.75  CALCIUM 7.9*  --  7.8* 8.0* 7.9* 7.8*  MG  --  1.9 2.0 2.0 2.0 2.0  PHOS  --  3.3 3.1 3.3 2.2* 1.9*   Estimated Creatinine Clearance: 63.4 mL/min (by C-G formula based on SCr of 0.75 mg/dL). Liver & Pancreas: Recent Labs  Lab 03/20/20 0226 03/21/20 0318 03/22/20 1415 03/23/20 0350  ALBUMIN 1.5* 1.6* 2.0* 1.8*   No results for input(s): LIPASE, AMYLASE in the last 168 hours. No results for input(s): AMMONIA in the last 168 hours. Diabetic: No results for input(s): HGBA1C in the last 72 hours. Recent Labs  Lab 03/22/20 2003 03/22/20 2351 03/23/20 0358 03/23/20 0807 03/23/20 1207  GLUCAP 293* 207* 273* 312* 322*   Cardiac Enzymes: No results for input(s): CKTOTAL, CKMB, CKMBINDEX, TROPONINI in the last 168 hours. No results for input(s): PROBNP in the last 8760 hours. Coagulation Profile: No results for input(s): INR, PROTIME in the last 168 hours. Thyroid Function Tests: No results for input(s): TSH, T4TOTAL, FREET4, T3FREE, THYROIDAB in the last 72 hours. Lipid Profile: No results for input(s): CHOL, HDL, LDLCALC, TRIG, CHOLHDL, LDLDIRECT in the last 72 hours. Anemia Panel: Recent Labs    03/21/20 0807  VITAMINB12 509  FOLATE 13.8  FERRITIN 1,373*  TIBC 123*  IRON 8*  RETICCTPCT 2.3   Urine analysis:    Component Value Date/Time   COLORURINE YELLOW 03/09/2020 0635   APPEARANCEUR HAZY (A) 03/09/2020 0635   LABSPEC 1.020 03/09/2020 0635   PHURINE 5.0 03/09/2020 0635   GLUCOSEU NEGATIVE 03/09/2020 0635   HGBUR NEGATIVE 03/09/2020 0635   BILIRUBINUR  NEGATIVE 03/09/2020 0635   BILIRUBINUR neg 02/20/2015 1040   KETONESUR NEGATIVE 03/09/2020 0635   PROTEINUR 100 (A) 03/09/2020 0635   UROBILINOGEN 0.2 02/20/2015 1040   UROBILINOGEN 1.0 02/04/2015 1350   NITRITE NEGATIVE 03/09/2020 0635   LEUKOCYTESUR NEGATIVE 03/09/2020 0635   Sepsis Labs: Invalid input(s): PROCALCITONIN, LACTICIDVEN  Microbiology: Recent Results (from the past 240 hour(s))  MRSA PCR Screening     Status: None   Collection Time: 03/19/20 11:08 PM   Specimen: Nasal Mucosa; Nasopharyngeal  Result Value Ref Range Status   MRSA by PCR NEGATIVE NEGATIVE Final    Comment:        The GeneXpert MRSA Assay (FDA approved for NASAL specimens only), is one component of a comprehensive MRSA colonization surveillance program. It is not intended to diagnose MRSA infection nor to guide or monitor treatment for MRSA infections. Performed at [facility], [address] [place], [place] [address]   Aerobic Culture (superficial specimen)     Status: None (Preliminary result)   Collection Time: 03/20/20  5:03 PM   Specimen: Wound  Result Value Ref Range Status   Specimen Description WOUND STERNUM  Final   Special Requests NONE  Final   Gram Stain   Final    FEW WBC PRESENT, PREDOMINANTLY PMN FEW GRAM POSITIVE COCCI IN CLUSTERS RARE GRAM POSITIVE RODS    Culture   Final    FEW SERRATIA MARCESCENS CULTURE REINCUBATED FOR BETTER GROWTH Performed at [facility], [address], [place], [place] [address]    Report Status PENDING  Incomplete   Organism ID, Bacteria SERRATIA MARCESCENS  Final      Susceptibility   Serratia marcescens - MIC*    CEFAZOLIN >=64 RESISTANT Resistant     CEFEPIME <=1 SENSITIVE Sensitive  CEFTAZIDIME 2 SENSITIVE Sensitive     CEFTRIAXONE 8 SENSITIVE Sensitive     CIPROFLOXACIN <=0.25 SENSITIVE Sensitive     GENTAMICIN >=16 RESISTANT Resistant     TRIMETH/SULFA <=20 SENSITIVE Sensitive     * FEW SERRATIA MARCESCENS     Radiology Studies: CT EXTREMITY LOWER RIGHT W CONTRAST  Result Date: 03/23/2020 CLINICAL DATA:  Right lower leg swelling. EXAM: CT OF THE LOWER RIGHT EXTREMITY WITH CONTRAST TECHNIQUE: Multidetector CT imaging of the lower right extremity was performed according to the standard protocol following intravenous contrast administration. COMPARISON:  None. CONTRAST:  OMNIPAQUE IOHEXOL 300 MG/ML  SOLN FINDINGS: Bones/Joint/Cartilage No bony destruction or periosteal reaction. No fracture or dislocation. Mild tricompartmental joint space narrowing of the knee. No joint effusion. Osteopenia. Ligaments Ligaments are suboptimally evaluated by CT. Muscles and Tendons Prominent fluid within and loss of the normal architecture involving the visualized distal semimembranosus and sartorius muscles. Prominent perifascial fluid within the posterior compartment of the distal thigh extending below the knee, with areas of peripheral enhancement (series 5, image 6; series 9, image 73). Remaining muscles are unremarkable. The visualized tendons are grossly intact. Soft tissue Circumferential soft tissue swelling of the lower leg. Soft tissue defect along the medial proximal lower leg. No subcutaneous emphysema. No soft tissue mass. IMPRESSION: 1. Prominent fluid within and loss of the normal architecture involving the visualized distal semimembranosus and sartorius muscles. Associated extensive perifascial fluid within the posterior compartment of the distal thigh and extending below the knee with areas of peripheral enhancement. Findings are concerning for pyomyofascitis. 2. Circumferential soft tissue swelling of the lower leg, consistent with cellulitis. 3. No acute osseous abnormality. Electronically Signed   By: Obie Dredge M.D.   On: 03/23/2020 13:45      Zaidan Keeble T. Bakary Bramer [facility]  If 7PM-7AM, please contact night-coverage [email or web address] Password [facility] 03/23/2020, 2:03 PM

## 2020-03-23 NOTE — Progress Notes (Signed)
Pharmacy Antibiotic Note  Bianca [name] is a 73 y.o. female admitted on 03/09/2020 with sternal wound infection.  Pharmacy has been consulted for cefepime and vancomycin dosing.  Today, Tmax 99.4, WBC elevated but stable from previous day at 14.6. Right leg CT showing swelling consistent with cellulitis, fluid collections concerning for pyomyofascitis  Plan: Restart vancomycin at 1000 mg IV q12h  Continue cefepime 2g IV q8h Monitor renal function, clinical picture, vanc trough as indicated F/U C&S, abx de-escalation, LOT   Height: 5\' 6"  (167.6 cm) Weight: 76.3 kg (168 lb 3.4 oz) (wrong weight first entered) IBW/kg (Calculated) : 59.3  Temp (24hrs), Avg:99 F (37.2 C), Min:98.6 F (37 C), Max:99.4 F (37.4 C)  Recent Labs  Lab 03/18/20 1543 03/18/20 1740 03/20/20 0226 03/21/20 0318 03/22/20 1415 03/23/20 0350  WBC  --  18.4* 15.2* 15.5* 14.5* 14.6*  CREATININE 0.72  --  0.66  0.68 0.64  0.67 0.63 0.75    Estimated Creatinine Clearance: 63.4 mL/min (by C-G formula based on SCr of 0.75 mg/dL).    Allergies  Allergen Reactions  . Tramadol Nausea And Vomiting    Antimicrobials this admission: Vanc 6/9 >6/12; 6/13> Cefepime 6/12>  Microbiology results: 6/1 BCx: no growth 5/30 UCx: no growth  6/9 MRSA PCR: neg 6/10 wound cx: pending- Serratia marcescens   8/9, PharmD PGY2 Pharmacy Resident Phone [phone]  03/23/2020   2:36 PM

## 2020-03-23 NOTE — Progress Notes (Signed)
Daily Progress Note   Patient Name: Bianca [name]       Date: 03/23/2020 DOB: Mar 13, 1946  Age: 74 y.o. MRN#: [id number] Attending Physician: Almon Hercules, MD Primary Care Physician: Luciano Cutter, PA-C Admit Date: 03/09/2020  Reason for Consultation/Follow-up: To discuss complex medical decision making related to patient's goals of care  Subjective: After speaking with Dr. Alanda Slim, I had the opportunity to speak with Bianca [name] and Bianca [name].  Their mother had a difficult day yesterday with nausea, some vomiting and feeling poorly.  Today is a better day.  She is alert, not nauseated, tolerating tube feeds and took several bites at breakfast.  Bianca [name] points out the red endurated area on her mother's thigh.  This will be imaged this afternoon.  There is concern for underlying fluid collection / infection.  I talked with them about the cor trak being temporary due to infection risk.  It is only used in the hospital or a LTAC setting - not outside of the hospital.  The doctor had suggested a week long trial to see if her energy level and appetite improve.   Bianca [name] states today is day 2 as feedings did not start until 6/12.  Both son and daughter would like to leave the tube in as long as possible to give their mother the best chance to improve.  They still believe a PEG is not a good option for her (They do not want to put her thru another procedure and she is not healing wounds well).   Assessment: Patient frail and dependent.  Found to have suffered multiple sub acute/chronic CVAs.  Has done poorly essentially since CABG surgery.  Wounds are very difficult to heal.  She has made incremental improvements this hospitalization.  Family wants to give her the best chance possible.  They are weighing  home with home health and Palliative vs home with hospice.  Per psychiatry she does not have capacity to make health care decisions.   Patient Profile/HPI:  74 y.o. female  with past medical history of CAD s/p CABG recently in 2021, DM, constipation and hypertension, who was admitted on 03/09/2020 with lethargy.  She was hospitalized 5/17 - 5/25 for wound dehiscence and leg swelling at the harvest site.  Since her surgery April 30th the patient has failed to thrive.  She  has had 5 hospitalizations in 3 months and is no longer eating enough to sustain herself long term.   PMT was consulted for goals of care.    Length of Stay: 13   Vital Signs: BP (!) 149/69 (BP Location: Left Arm)   Pulse 99   Temp 99.1 F (37.3 C) (Oral)   Resp 18   Ht 5\' 6"  (1.676 m)   Wt 76.3 kg Comment: wrong weight first entered  SpO2 97%   BMI 27.15 kg/m  SpO2: SpO2: 97 % O2 Device: O2 Device: Room Air O2 Flow Rate:         Palliative Assessment/Data:20%     Palliative Care Plan    Recommendations/Plan:  Continue current care.    Family would like to leave tube in place as long as beneficial.  If she improves then likely home with home health and palliative to follow  If she declines then likely home with Hospice.  Family requests that an attempt be made to get her out of bed daily. As she is a 2+ assist, PT orders adjusted to "rountine" and requested "daily" in comments.    Code Status:  Full code.  May re-assess this with family prior to discharge particularly if she does not have significant improvement.  Unfortunately I do not feel aggressive resuscitation attempts would benefit Ms. Talsma if she arrested.  I am concerned about the risk of causing her unnecessary harm and pain in her last moments.  Prognosis:   < 6 months   Discharge Planning:  To Be Determined  Care plan was discussed with RN and family  Thank you for allowing the Palliative Medicine Team to assist in the care of  this patient.  Total time spent:  35 min.     Greater than 50%  of this time was spent counseling and coordinating care related to the above assessment and plan.  [name], PA-C Palliative Medicine  Please contact Palliative MedicineTeam phone at [phone] for questions and concerns between 7 am - 7 pm.   Please see AMION for individual provider pager numbers.

## 2020-03-24 DIAGNOSIS — Z66 Do not resuscitate: Secondary | ICD-10-CM

## 2020-03-24 LAB — CBC WITH DIFFERENTIAL/PLATELET
Abs Immature Granulocytes: 0.38 10*3/uL — ABNORMAL HIGH (ref 0.00–0.07)
Basophils Absolute: 0.1 10*3/uL (ref 0.0–0.1)
Basophils Relative: 1 %
Eosinophils Absolute: 0.3 10*3/uL (ref 0.0–0.5)
Eosinophils Relative: 2 %
HCT: 24.2 % — ABNORMAL LOW (ref 36.0–46.0)
Hemoglobin: 7.4 g/dL — ABNORMAL LOW (ref 12.0–15.0)
Immature Granulocytes: 3 %
Lymphocytes Relative: 11 %
Lymphs Abs: 1.5 10*3/uL (ref 0.7–4.0)
MCH: 26.9 pg (ref 26.0–34.0)
MCHC: 30.6 g/dL (ref 30.0–36.0)
MCV: 88 fL (ref 80.0–100.0)
Monocytes Absolute: 1.1 10*3/uL — ABNORMAL HIGH (ref 0.1–1.0)
Monocytes Relative: 7 %
Neutro Abs: 11.1 10*3/uL — ABNORMAL HIGH (ref 1.7–7.7)
Neutrophils Relative %: 76 %
Platelets: 432 10*3/uL — ABNORMAL HIGH (ref 150–400)
RBC: 2.75 MIL/uL — ABNORMAL LOW (ref 3.87–5.11)
RDW: 17.9 % — ABNORMAL HIGH (ref 11.5–15.5)
WBC: 14.5 10*3/uL — ABNORMAL HIGH (ref 4.0–10.5)
nRBC: 0 % (ref 0.0–0.2)

## 2020-03-24 LAB — GLUCOSE, CAPILLARY
Glucose-Capillary: 206 mg/dL — ABNORMAL HIGH (ref 70–99)
Glucose-Capillary: 171 mg/dL — ABNORMAL HIGH (ref 70–99)
Glucose-Capillary: 134 mg/dL — ABNORMAL HIGH (ref 70–99)
Glucose-Capillary: 121 mg/dL — ABNORMAL HIGH (ref 70–99)
Glucose-Capillary: 200 mg/dL — ABNORMAL HIGH (ref 70–99)
Glucose-Capillary: 169 mg/dL — ABNORMAL HIGH (ref 70–99)

## 2020-03-24 LAB — CREATININE, SERUM
Creatinine, Ser: 0.62 mg/dL (ref 0.44–1.00)
GFR calc Af Amer: >60 mL/min (ref >60)
GFR calc non Af Amer: >60 mL/min (ref >60)

## 2020-03-24 LAB — RENAL FUNCTION PANEL
Albumin: 1.6 g/dL — ABNORMAL LOW (ref 3.5–5.0)
Anion gap: 8 (ref 5–15)
BUN: 15 mg/dL (ref 8–23)
CO2: 27 mmol/L (ref 22–32)
Calcium: 7.8 mg/dL — ABNORMAL LOW (ref 8.9–10.3)
Chloride: 102 mmol/L (ref 98–111)
Creatinine, Ser: 0.63 mg/dL (ref 0.44–1.00)
GFR calc Af Amer: >60 mL/min (ref >60)
GFR calc non Af Amer: >60 mL/min (ref >60)
Glucose, Bld: 136 mg/dL — ABNORMAL HIGH (ref 70–99)
Phosphorus: 2.1 mg/dL — ABNORMAL LOW (ref 2.5–4.6)
Potassium: 4 mmol/L (ref 3.5–5.1)
Sodium: 137 mmol/L (ref 135–145)

## 2020-03-24 LAB — MAGNESIUM: Magnesium: 1.9 mg/dL (ref 1.7–2.4)

## 2020-03-24 MED ORDER — MORPHINE SULFATE (CONCENTRATE) 10 MG /0.5 ML PO SOLN: 10.0000 mg | ORAL | 0 refills | Status: AC | PRN

## 2020-03-24 MED ORDER — ONDANSETRON HCL 4 MG PO TABS: 4.0000 mg | ORAL_TABLET | Freq: Every day | ORAL | 1 refills | Status: AC | PRN

## 2020-03-24 MED ORDER — GLYCOPYRROLATE 1 MG PO TABS: 1.0000 mg | ORAL_TABLET | Freq: Three times a day (TID) | ORAL | 0 refills | Status: AC | PRN

## 2020-03-24 MED ORDER — LORAZEPAM 0.5 MG PO TABS: 0.5000 mg | ORAL_TABLET | ORAL | 0 refills | Status: AC | PRN

## 2020-03-24 NOTE — Progress Notes (Signed)
Nutrition Follow-up  RD working remotely.  DOCUMENTATION CODES:   Severe malnutrition in context of acute illness/injury  INTERVENTION:   -D/cMagic cup TID with meals, each supplement provides 290 kcal and 9 grams of protein -D/cEnsure Max podaily, each supplement provides 150 kcal and 30 grams of protein. -D/cGlucerna Shake poBID, each supplement provides 220 kcal and 10 grams of protein -D/c Prostat due to poor acceptance -Continue liberalized diet of regular  -Continue Osmolite 1.5 @ 55 ml/hr via cortrak tube and increase by 10 ml every 8 hours to goal rate of 55 ml/hr.   Tube feeding regimen provides 1980 kcal (99% of needs), 83 grams of protein (75% of estimated protein needs), and 1006 ml of H2O.   NUTRITION DIAGNOSIS:   Severe Malnutrition related to acute illness (CAD s/p CABG) as evidenced by energy intake < or equal to 75% for > or equal to 1 month, mild fat depletion, moderate fat depletion, moderate muscle depletion, mild muscle depletion, percent weight loss.  Ongoing  GOAL:   Patient will meet greater than or equal to 90% of their needs  Progressing   MONITOR:   PO intake, Supplement acceptance, Diet advancement, Labs, Weight trends, Skin, I & O's  REASON FOR ASSESSMENT:   Consult Assessment of nutrition requirement/status  ASSESSMENT:   Bianca [name] is a 74 y.o. female with medical history significant of CAD s/p CABG; HTN; and DM presenting with lethargy.  She had CABG 1 month ago and was subsequently hospitalized from 5/17-25 for wound dehiscence and leg swelling.  6/11- s/p cortrak tube placement- tip of tube confirmed in the stomach  Reviewed I/O's: -700 ml x 24 hours and +3.5 L since 03/10/20  UOP: 700 ml x 24 hours  Intake has declined over the weekend. Noted meal completion 0%. Pt is also refusing oral nutrition supplements.   Reviewed RNCM and palliative care notes; plan to d/c home with hospice tomorrow. Family is understanding  that TF will be d/c prior to discharged.   Medications reviewed and include colace, marinol, and reglan.  Labs reviewed: K and Mg. Phos: 2.1. CBGS: 134-171 (inpatient orders for glycemic control are 0-20 units insulin aspart every 4 hours and 5 units insulin glargine BID).   Diet Order:   Diet Order            Diet regular Room service appropriate? Yes; Fluid consistency: Thin  Diet effective now                 EDUCATION NEEDS:   Education needs have been addressed  Skin:  Skin Assessment: Skin Integrity Issues: Skin Integrity Issues:: Unstageable Stage II: - Unstageable: sacrum Wound Vac: d/c 03/21/20 Incisions: sternal surgical wound with adherent white slough, rt lower extremity  Last BM:  03/23/20  Height:   Ht Readings from Last 1 Encounters:  03/09/20 5\' 6"  (1.676 m)    Weight:   Wt Readings from Last 1 Encounters:  03/19/20 76.3 kg    Ideal Body Weight:  59.1 kg  BMI:  Body mass index is 27.15 kg/m.  Estimated Nutritional Needs:   Kcal:  2000-2200  Protein:  110-125 grams  Fluid:  > 2 L    05/19/20, RD, LDN, CDCES Registered Dietitian II Certified Diabetes Care and Education Specialist Please refer to [facility] for RD and/or RD on-call/weekend/after hours pager

## 2020-03-24 NOTE — Progress Notes (Signed)
Physical Therapy Discharge Patient Details Name: Bianca [name] MRN: [id number] DOB: [birth year] Today's Date: 03/24/2020 Time:  -     Patient discharged from PT services secondary to medical decline - will need to re-order PT to resume therapy services.  Please see latest therapy progress note for current level of functioning and progress toward goals.    Progress and discharge plan discussed with patient and/or caregiver: Patient/Caregiver agrees with plan.  Nursing consulted about this change since Hospice is ordered and OT discontinued, as well as PT now.  GP     Winnifred Friar, PT MS Acute Rehab Dept. Number: [facility] [phone] and [facility] [phone]  03/24/2020, 12:22 PM

## 2020-03-24 NOTE — Progress Notes (Signed)
[facility])  Referral received for hospice services at home once discharged.  Spoke with dtr and confirmed, explained services and provided support.  Plan is to d/c home once DME is set up.  Bed and hoyer lift have already been delivered, but family is requesting an air mattress.  [facility] will order.  Not sure if it will be delivered today or not.  Family requests that she d/c home Tuesday as there is transportation issues with her son, who will be providing care for her.  If needed, please arrange for any comfort scripts to be sent for pt so there is no lapse in her symptom management.  Thank you, Wallis Bamberg RN, BSN, CCRN

## 2020-03-24 NOTE — Progress Notes (Signed)
PROGRESS NOTE  Bianca CulverMarlyn S Melecio ZOX:[id number] DOB: [birth year]   PCP: Luciano CutterBujanowski, Melissa, PA-C  Patient is from: Home  DOA: 03/09/2020 LOS: 14  Brief Narrative / Interim history: 74 year old female with history of CAD/CABG on 02/08/2020, DM-2, HTN and recent hospitalization from 5/17-5/25 for wound dehiscence and leg swelling presenting with poor p.o. intake and emesis, and admitted for hypotension, AKI and FTT/severe protein calorie malnutrition.  She was started on IV fluid.  Patient had CT head and MRI.  That revealed multiple subacute/chronic infarcts including globus pallidus, right parietal lobe and bilateral frontal lobes.  Bilateral CUS without significant finding.  Echo with mildly reduced EF.  Neurology consulted.  CTA head and neck without flow obstructing stenosis.  Patient was noted to have some purulent discharge from sternotomy wound site.  Started on IV vancomycin.  Cardiothoracic surgery consulted.  Wound culture obtained and grew Serratia marcescens resistant to Ancef and gentamicin.  She was transitioned to IV cefepime per pharmacy recommendation.  Patient continued to do poorly from p.o. intake standpoint.  Eventually started on tube feed via cortrak on 03/21/2020.  Also started on IV Lasix with IV albumin.  Patient noted to have worsening swelling in her right thigh.  CT revealed pyomyositis.  General surgery consulted.  After discussion about risks and benefits of surgery and no surgery, patient and family decided to pursue comfort measures.  CODE STATUS changed to DNR/DNI.  Patient would like to go home with home hospice. She will be discharged on 6/15 once required DME are delivered.  Subjective: Seen and examined earlier this morning.  No major events overnight or this morning.  No complaints.  She denies pain and shortness of breath.  Right thigh abscess started to drain spontaneously. We squeezed about 30-50 cc of purulent material with patient's daughter and applied  dry guaze dressing.  Patient tolerated well.   Objective: Vitals:   03/24/20 0210 03/24/20 0310 03/24/20 0510 03/24/20 0814  BP: (!) 122/59 131/64 (!) 126/57 139/66  Pulse: 95 (!) 101 96 95  Resp: 12 12 16  (!) 24  Temp: 98.6 F (37 C) 98.9 F (37.2 C) 98.8 F (37.1 C) 97.9 F (36.6 C)  TempSrc: Oral Oral Oral   SpO2: 100% 99% 100% 98%  Weight:      Height:        Intake/Output Summary (Last 24 hours) at 03/24/2020 1550 Last data filed at 03/24/2020 0845 Gross per 24 hour  Intake --  Output 700 ml  Net -700 ml   Filed Weights   03/09/20 0209 03/19/20 1821  Weight: 64 kg 76.3 kg    Examination:  GENERAL: Frail and chronically ill-appearing.  No apparent distress.  Nontoxic. HEENT: MMM.  Vision and hearing grossly intact.  NECK: Supple.  No apparent JVD.  RESP: On room air.  No IWOB.  Fair aeration bilaterally. CVS:  RRR. Heart sounds normal.  ABD/GI/GU: BS+. Abd soft, NTND.  MSK/EXT: 2+ pitting edema in RLE.  Induration and skin erythema in her right thigh proximally. We expressed about 30-50 cc purulent material with gentle pressure, and dressed with dray guaze.  SKIN: Erythema and skin induration as above. NEURO: Awake, alert and oriented fairly.  No apparent focal neuro deficit. PSYCH: Calm.  Somewhat flat affect.  Appears tired.   Procedures:  None  Microbiology summarized: COVID-19 PCR negative. Blood cultures negative. Sternotomy wound culture with Serratia marcescens resistant to Ancef and gentamicin.  Assessment & Plan: Comfort measures care only/DNR/DNI -Emphasize symptomatic management -Family would like  to continue tube feed, antibiotics and current medications until discharged home with home hospice on 6/15  Failure to thrive/severe malnutrition/poor p.o. intake in the setting of subacute CVA in a patient with recent CABG. Significant hypoalbuminemia. It is unclear if hypotension in the setting of poor p.o. intake led to CVA or vice versa. -Continue  tube feeding via coretrack per family's request until discharge. -Continue Reglan. Nutrition Problem: Severe Malnutrition Etiology: acute illness (CAD s/p CABG) Signs/Symptoms: energy intake < or equal to 75% for > or equal to 1 month, mild fat depletion, moderate fat depletion, moderate muscle depletion, mild muscle depletion, percent weight loss Percent weight loss: 19.9 % Interventions: Glucerna shake, MVI, Magic cup, Premier Protein  Right thigh pyomyositis/cellulitis-RLE CT with contrast concerning for pyomyositis with cellulitis -Appreciate help by general surgery -Surgical intervention deferred after risk-benefit discussion -Continue antibiotics per family's request until discharge on 6/15.  CAD/CABG on 6/44/0347 complicated by RLE infection, RLE wound dehiscence and delayed healing of sternotomy wound -Appreciate help by cardiothoracic surgery -Antibiotics as above.  Hypotension/dehydration in the setting of the above: Echo with EF of 45 to 50%. TSH and cortisol level within normal. -Continue holding home antihypertensive medications -Diuretics as needed  Subacute/chronic CVA in multiple territories: Likely ischemic insult in the setting of hypotension and atherosclerotic changes.  CTA head and neck without flow obstructing stenosis.  Acute on chronic systolic CHF: Echo with EF of 45 to 50% and RVSP to 25.3.  Unknown diastolic function.  Looks fluid overloaded with significant RLE edema.  Responded well to IV Lasix.  Had 2.2 L UOP/24 hours. Cr slightly up to   Uncontrolled DM-2 with hypoglycemia: A1c 7.1% on 02/25/2020 Recent Labs    03/24/20 0455 03/24/20 0804 03/24/20 1137  GLUCAP 171* 134* 121*  -Continue current regimen  Hypokalemia/hypophosphatemia:  -Potassium phosphate 20 mmol x 1  Iron deficiency anemia/anemia of chronic disease: Baseline Hgb 7-8>> 7.3>7.1>1u> 9.3 (exaggerated)>7.8>7.4  Iron sat 6%.  TIBC 123 (low).  Received IV Feraheme and 1 unit of blood  transfusion -Monitor H&H  Hyperlipidemia: LDL down from 152-22 in late in 3 months. -Continue statin.  Leukocytosis/bandemia: Likely due to infectious process as above. -Antibiotics as above  Constipation:  -Scheduled MiraLAX and Colace daily  Multiple thyroid nodules  Debility/generalized weakness/physical deconditioning-multifactorial including CAD, hypertension, FTT and anemia   Sacral decubitus: POA Pressure Injury 03/12/20 Coccyx Mid Stage 2 -  Partial thickness loss of dermis presenting as a shallow open injury with a red, pink wound bed without slough. (Active)  03/12/20 0930  Location: Coccyx  Location Orientation: Mid  Staging: Stage 2 -  Partial thickness loss of dermis presenting as a shallow open injury with a red, pink wound bed without slough.  Wound Description (Comments):   Present on Admission:    DVT prophylaxis: Subcu Lovenox Code Status: Full code Family Communication: Updated patient's daughter at bedside and patient's son over the phone Status is: Inpatient  Remains inpatient appropriate because:Hemodynamically unstable, IV treatments appropriate due to intensity of illness or inability to take PO and Inpatient level of care appropriate due to severity of illness   Dispo: The patient is from: Home              Anticipated d/c is to: Home with home hospice              Anticipated d/c date is: 1 day              Patient currently is not medically stable  to d/c.       Consultants:  Neurology-signed off. Palliative medicine Cardiothoracic surgery-signed off General surgery-signed off  Sch Meds:  Scheduled Meds: . acetaminophen  650 mg Oral Q6H  . aspirin EC  81 mg Oral Daily  . atorvastatin  80 mg Oral Daily  . bisacodyl  10 mg Rectal Daily  . Chlorhexidine Gluconate Cloth  6 each Topical Daily  . clopidogrel  75 mg Oral Daily  . collagenase   Topical Daily  . docusate sodium  100 mg Oral BID  . dronabinol  2.5 mg Oral BID AC  .  enoxaparin (LOVENOX) injection  40 mg Subcutaneous Daily  . feeding supplement (GLUCERNA SHAKE)  237 mL Oral BID BM  . feeding supplement (PRO-STAT SUGAR FREE 64)  30 mL Per Tube BID  . insulin aspart  0-20 Units Subcutaneous Q4H  . insulin glargine  5 Units Subcutaneous BID  . loratadine  10 mg Oral Daily  . metoCLOPramide (REGLAN) injection  5 mg Intravenous Q8H  . multivitamin with minerals  1 tablet Oral Daily  . polyethylene glycol  17 g Oral Daily  . Ensure Max Protein  11 oz Oral Daily  . sertraline  50 mg Oral Daily  . sodium chloride flush  3 mL Intravenous Q12H   Continuous Infusions: . ceFEPime (MAXIPIME) IV 2 g (03/24/20 1414)  . feeding supplement (OSMOLITE 1.5 CAL) 1,000 mL (03/24/20 0618)  . vancomycin 1,000 mg (03/24/20 0227)   PRN Meds:.hydrALAZINE, ondansetron **OR** ondansetron (ZOFRAN) IV  Antimicrobials: Anti-infectives (From admission, onward)   Start     Dose/Rate Route Frequency Ordered Stop   03/23/20 1500  vancomycin (VANCOCIN) IVPB 1000 mg/200 mL premix     Discontinue     1,000 mg 200 mL/hr over 60 Minutes Intravenous Every 12 hours 03/23/20 1446     03/22/20 1530  ceFEPIme (MAXIPIME) 2 g in sodium chloride 0.9 % 100 mL IVPB     Discontinue     2 g 200 mL/hr over 30 Minutes Intravenous Every 8 hours 03/22/20 1529     03/22/20 1300  vancomycin (VANCOREADY) IVPB 750 mg/150 mL  Status:  Discontinued        750 mg 150 mL/hr over 60 Minutes Intravenous Every 12 hours 03/22/20 1231 03/22/20 1529   03/20/20 0830  vancomycin (VANCOREADY) IVPB 750 mg/150 mL  Status:  Discontinued        750 mg 150 mL/hr over 60 Minutes Intravenous Every 12 hours 03/19/20 1848 03/22/20 1231   03/19/20 1915  vancomycin (VANCOREADY) IVPB 1500 mg/300 mL        1,500 mg 150 mL/hr over 120 Minutes Intravenous  Once 03/19/20 1848 03/19/20 2255       I have personally reviewed the following labs and images: CBC: Recent Labs  Lab 03/18/20 1740 03/18/20 1740 03/20/20 0226  03/21/20 0318 03/22/20 1415 03/23/20 0350 03/24/20 0746  WBC 18.4*   < > 15.2* 15.5* 14.5* 14.6* 14.5*  NEUTROABS 15.9*  --  12.7*  --   --   --  11.1*  HGB 7.3*   < > 7.1* 9.3* 7.8* 7.8* 7.4*  HCT 24.3*   < > 23.3* 29.8* 25.1* 25.3* 24.2*  MCV 88.0   < > 87.3 87.4 86.6 87.5 88.0  PLT 349   < > 390 440* 448* 470* 432*   < > = values in this interval not displayed.   BMP &GFR Recent Labs  Lab 03/20/20 0226 03/21/20 0318 03/22/20 1415 03/23/20 0350  03/24/20 0746  NA 135 136 137 139 137  K 3.6 3.4* 3.5 3.7 4.0  CL 103 101 102 104 102  CO2 25 22 26 27 27   GLUCOSE 63* 85 229* 272* 136*  BUN 10 10 12 14 15   CREATININE 0.66  0.68 0.64  0.67 0.63 0.75 0.62  0.63  CALCIUM 7.8* 8.0* 7.9* 7.8* 7.8*  MG 2.0 2.0 2.0 2.0 1.9  PHOS 3.1 3.3 2.2* 1.9* 2.1*   Estimated Creatinine Clearance: 63.4 mL/min (by C-G formula based on SCr of 0.62 mg/dL). Liver & Pancreas: Recent Labs  Lab 03/20/20 0226 03/21/20 0318 03/22/20 1415 03/23/20 0350 03/24/20 0746  ALBUMIN 1.5* 1.6* 2.0* 1.8* 1.6*   No results for input(s): LIPASE, AMYLASE in the last 168 hours. No results for input(s): AMMONIA in the last 168 hours. Diabetic: No results for input(s): HGBA1C in the last 72 hours. Recent Labs  Lab 03/23/20 2148 03/24/20 0121 03/24/20 0455 03/24/20 0804 03/24/20 1137  GLUCAP 246* 206* 171* 134* 121*   Cardiac Enzymes: No results for input(s): CKTOTAL, CKMB, CKMBINDEX, TROPONINI in the last 168 hours. No results for input(s): PROBNP in the last 8760 hours. Coagulation Profile: No results for input(s): INR, PROTIME in the last 168 hours. Thyroid Function Tests: No results for input(s): TSH, T4TOTAL, FREET4, T3FREE, THYROIDAB in the last 72 hours. Lipid Profile: No results for input(s): CHOL, HDL, LDLCALC, TRIG, CHOLHDL, LDLDIRECT in the last 72 hours. Anemia Panel: No results for input(s): VITAMINB12, FOLATE, FERRITIN, TIBC, IRON, RETICCTPCT in the last 72 hours. Urine analysis:      Component Value Date/Time   COLORURINE YELLOW 03/09/2020 0635   APPEARANCEUR HAZY (A) 03/09/2020 0635   LABSPEC 1.020 03/09/2020 0635   PHURINE 5.0 03/09/2020 0635   GLUCOSEU NEGATIVE 03/09/2020 0635   HGBUR NEGATIVE 03/09/2020 0635   BILIRUBINUR NEGATIVE 03/09/2020 0635   BILIRUBINUR neg 02/20/2015 1040   KETONESUR NEGATIVE 03/09/2020 0635   PROTEINUR 100 (A) 03/09/2020 0635   UROBILINOGEN 0.2 02/20/2015 1040   UROBILINOGEN 1.0 02/04/2015 1350   NITRITE NEGATIVE 03/09/2020 0635   LEUKOCYTESUR NEGATIVE 03/09/2020 0635   Sepsis Labs: Invalid input(s): PROCALCITONIN, LACTICIDVEN  Microbiology: Recent Results (from the past 240 hour(s))  MRSA PCR Screening     Status: None   Collection Time: 03/19/20 11:08 PM   Specimen: Nasal Mucosa; Nasopharyngeal  Result Value Ref Range Status   MRSA by PCR NEGATIVE NEGATIVE Final    Comment:        The GeneXpert MRSA Assay (FDA approved for NASAL specimens only), is one component of a comprehensive MRSA colonization surveillance program. It is not intended to diagnose MRSA infection nor to guide or monitor treatment for MRSA infections. Performed at [facility], [address], [place], [address] [place]   Aerobic Culture (superficial specimen)     Status: None (Preliminary result)   Collection Time: 03/20/20  5:03 PM   Specimen: Wound  Result Value Ref Range Status   Specimen Description WOUND STERNUM  Final   Special Requests NONE  Final   Gram Stain   Final    FEW WBC PRESENT, PREDOMINANTLY PMN FEW GRAM POSITIVE COCCI IN CLUSTERS RARE GRAM POSITIVE RODS    Culture   Final    FEW SERRATIA MARCESCENS RARE PSEUDOMONAS AERUGINOSA CULTURE REINCUBATED FOR BETTER GROWTH Performed at [facility], [address] [place], [address] [place]    Report Status PENDING  Incomplete   Organism ID, Bacteria SERRATIA MARCESCENS  Final  Susceptibility   Serratia marcescens - MIC*    CEFAZOLIN >=64 RESISTANT Resistant      CEFEPIME <=1 SENSITIVE Sensitive     CEFTAZIDIME 2 SENSITIVE Sensitive     CEFTRIAXONE 8 SENSITIVE Sensitive     CIPROFLOXACIN <=0.25 SENSITIVE Sensitive     GENTAMICIN >=16 RESISTANT Resistant     TRIMETH/SULFA <=20 SENSITIVE Sensitive     * FEW SERRATIA MARCESCENS    Radiology Studies: No results found.    Cindie Rajagopalan T. Corina Stacy [facility]  If 7PM-7AM, please contact night-coverage [email or web address] Password [email or web address] 03/24/2020, 3:50 PM

## 2020-03-24 NOTE — TOC Progression Note (Signed)
Transition of Care [facility]) - Progression Note    Patient Details  Name: Bianca [name] MRN: [id number] Date of Birth: January 18, 1946  Transition of [facility]) CM/SW Contact  Dvonte Gatliff, Adria Devon, RN Phone Number: 03/24/2020, 10:59 AM  Clinical Narrative:     Spoke   Expected Discharge Plan: Home w Home Health Services Barriers to Discharge: Continued Medical Work up  Expected Discharge Plan and Services Expected Discharge Plan: Home w Home Health Services   Discharge Planning Services: CM Consult Post Acute Care Choice: Home Health, Durable Medical Equipment Living arrangements for the past 2 months: Single Family Home                 DME Arranged: Hospital bed, Lightweight manual wheelchair with seat cushion (hoyer)         HH Arranged: OT, PT, RN           Social Determinants of Health (SDOH) Interventions    Readmission Risk Interventions Readmission Risk Prevention Plan 03/04/2020 02/15/2020 02/14/2020  Transportation Screening Complete Complete -  PCP or Specialist Appt within 5-7 Days Complete Complete -  Home Care Screening Complete Complete -  Medication Review (RN CM) Complete Complete -  HRI or Home Care Consult - - Complete  Social Work Consult for Recovery Care Planning/Counseling - - Complete  Palliative Care Screening - - Not Applicable  Some recent data might be hidden

## 2020-03-24 NOTE — Progress Notes (Signed)
Daily Progress Note   Patient Name: Bianca [name]       Date: 03/24/2020 DOB: 28-Aug-1946  Age: 74 y.o. MRN#: [id number] Attending Physician: Mercy Riding, MD Primary Care Physician: Julian Hy, PA-C Admit Date: 03/09/2020  Reason for Consultation/Follow-up:  To discuss complex medical decision making related to patient's goals of care  Received communication from Trauma Surgery and attending physician last evening.  Patient refused surgery for thigh wound.  Likely pyomyofasciitis.  Patient's condition is terminal.  Family has decided to opt for home with hospice.  Patient will need a hospital bed and air mattress due to wounds.  Subjective: Met at bedside with patient, daughter and son.  Desire for Hospice services was confirmed.  Family specified [facility].  TOC order was placed.    Patient is in no distress and looking very pleasant today.  [name] and I met in a conference room and reviewed the MOST form.  As decision making surrogate for her mother, [name] chose DNR, Comfort Meaures, and No feeding tube.  We discussed her care for the rest of her stay at the hospital.  Family will be allowed to visit.  Will continue artificial feedings and hydration until the time of discharge tomorrow.  Assessment: Patient with terminal condition given multiple non-healing wounds, and wound infections, severe malnutrition, multiple CVAs, and failure to thrive after CABG.   Patient Profile/HPI:  74 y.o.femalewith past medical history of CAD s/p CABG recently in 2021, DM, constipation and hypertension,who was admitted on5/30/2021with lethargy.She was hospitalized 5/17 - 5/25 for wound dehiscence and leg swelling at [facility].Since her surgery April 30th the patient has  failed to thrive. She has had 5 hospitalizations in 3 months and is no longer eating enough to sustain herself long term. PMT was consulted for goals of care.    Length of Stay: 14   Vital Signs: BP 139/66 (BP Location: Left Arm)   Pulse 95   Temp 97.9 F (36.6 C)   Resp (!) 24   Ht _0  (1.676 m)   Wt 76.3 kg Comment: wrong weight first entered  SpO2 98%   BMI 27.15 kg/m  SpO2: SpO2: 98 % O2 Device: O2 Device: Room Air O2 Flow Rate:         Palliative Assessment/Data: 20%     Palliative  Care Plan    Recommendations/Plan:  MOST completed and placed on chart.   Patient is DNR, Comfort Measures, No feeding tube.  Home with hospice when arrangements are complete. Will need air mattress.  Will continue feeding tube, IVF, antibiotics while here in the hospital  Code Status:  DNR  Prognosis:   < 6 weeks due to severe malnutrition, poor PO intake multiple wounds and infection  Discharge Planning:  Home with [place] was discussed with family, RN, Attending MD.  Thank you for allowing the Palliative Medicine Team to assist in the care of this patient.  Total time spent:  45 min.     Greater than 50%  of this time was spent counseling and coordinating care related to the above assessment and plan.  [name], PA-C Palliative Medicine  Please contact Palliative MedicineTeam phone at [phone] for questions and concerns between 7 am - 7 pm.   Please see AMION for individual provider pager numbers.

## 2020-03-24 NOTE — Progress Notes (Signed)
   03/24/20 1200  PT Visit Information  Last PT Received On 03/24/20  Reason Eval/Treat Not Completed Medical issues which prohibited therapy    Samul Dada, PT MS Acute Rehab Dept. Number: [facility] [id number] and [facility] [phone]

## 2020-03-24 NOTE — Progress Notes (Signed)
Noticed  a large volume of feed like discharge from rectum when giving pt care this morning. Questionable absorption of feeding material. MD/dietician please evaluate possible need for reduction in feed rate

## 2020-03-24 NOTE — TOC Progression Note (Signed)
Transition of Care [facility]) - Progression Note    Patient Details  Name: Bianca [name] MRN: [id number] Date of Birth: [birth year]  [facility]) CM/SW Contact  Nadene Rubins Adria Devon, RN Phone Number: 03/24/2020, 11:06 AM  Clinical Narrative:     Spoke to patient's daughter Joletta [phone]. Amado Coe has decided to take her mother home with hospice through [facility]. Joletta aware tube feedings will be stopped, VAC was discontinued 6/11 , she will be shown wound care.  NCM made referral to Wallis Bamberg with [facility] for hospice and air mattress. Patient's son Montine Circle [phone] is contact person for delivery. Victorino Dike will order air mattress through [employer].  At discharge Amado Coe is requesting [facility] transport . Confirmed address.   Cory with [facility] aware.  Expected Discharge Plan: Home w Home Health Services Barriers to Discharge: Continued Medical Work up  Expected Discharge Plan and Services Expected Discharge Plan: Home w Home Health Services   Discharge Planning Services: CM Consult Post Acute Care Choice: Home Health, Durable Medical Equipment Living arrangements for the past 2 months: Single Family Home                 DME Arranged: Hospital bed, Lightweight manual wheelchair with seat cushion (hoyer)         HH Arranged: OT, PT, RN           Social Determinants of Health (SDOH) Interventions    Readmission Risk Interventions Readmission Risk Prevention Plan 03/04/2020 02/15/2020 02/14/2020  Transportation Screening Complete Complete -  PCP or Specialist Appt within 5-7 Days Complete Complete -  Home Care Screening Complete Complete -  Medication Review (RN CM) Complete Complete -  HRI or Home Care Consult - - Complete  Social Work Consult for Recovery Care Planning/Counseling - - Complete  Palliative Care Screening - - Not Applicable  Some recent data might be hidden

## 2020-03-25 LAB — GLUCOSE, CAPILLARY
Glucose-Capillary: 131 mg/dL — ABNORMAL HIGH (ref 70–99)
Glucose-Capillary: 149 mg/dL — ABNORMAL HIGH (ref 70–99)
Glucose-Capillary: 186 mg/dL — ABNORMAL HIGH (ref 70–99)
Glucose-Capillary: 201 mg/dL — ABNORMAL HIGH (ref 70–99)

## 2020-03-25 NOTE — Progress Notes (Signed)
Civil engineer, contracting  Requested DME was ordered yesterday.  D/C contingent upon family accepting DME delivery time.  [facility] will continue to follow and plan on admitting her to hospice once she discharges.    Wallis Bamberg RN, BSN, CCRN [facility] Liaison

## 2020-03-25 NOTE — TOC Progression Note (Addendum)
Transition of Care [facility]) - Progression Note    Patient Details  Name: KAMILA BRODA MRN: [id number] Date of Birth: January 31, 1946  [facility] CM/SW Contact  Nadene Rubins Adria Devon, RN Phone Number: 03/25/2020, 12:14 PM  Clinical Narrative:   1500 bedside nurse, patient and daughter ready for discharge. Called [facility] estimated 1 hour to 1.5 hours for pick up. Patient , nurse , and daughter aware. [facility] aware  Patient's daughter at bedside. Air mattress has been delivered to home.   [facility] paperwork placed in shadow chart.   Messaged MD.   Expected Discharge Plan: Home w Home Health Services Barriers to Discharge: Continued Medical Work up  Expected Discharge Plan and Services Expected Discharge Plan: Home w Home Health Services   Discharge Planning Services: CM Consult Post Acute Care Choice: Home Health, Durable Medical Equipment Living arrangements for the past 2 months: Single Family Home                 DME Arranged: Hospital bed, Lightweight manual wheelchair with seat cushion (hoyer)         HH Arranged: OT, PT, RN           Social Determinants of Health (SDOH) Interventions    Readmission Risk Interventions Readmission Risk Prevention Plan 03/04/2020 02/15/2020 02/14/2020  Transportation Screening Complete Complete -  PCP or Specialist Appt within 5-7 Days Complete Complete -  Home Care Screening Complete Complete -  Medication Review (RN CM) Complete Complete -  HRI or Home Care Consult - - Complete  Social Work Consult for Recovery Care Planning/Counseling - - Complete  Palliative Care Screening - - Not Applicable  Some recent data might be hidden

## 2020-03-25 NOTE — Plan of Care (Signed)
  Problem: Education: Goal: Knowledge of General Education information will improve Description Including pain rating scale, medication(s)/side effects and non-pharmacologic comfort measures Outcome: Progressing   Problem: Health Behavior/Discharge Planning: Goal: Ability to manage health-related needs will improve Outcome: Progressing   Problem: Clinical Measurements: Goal: Ability to maintain clinical measurements within normal limits will improve Outcome: Progressing   Problem: Elimination: Goal: Will not experience complications related to bowel motility Outcome: Progressing Goal: Will not experience complications related to urinary retention Outcome: Progressing   Problem: Pain Managment: Goal: General experience of comfort will improve Outcome: Progressing   Problem: Safety: Goal: Ability to remain free from injury will improve Outcome: Progressing   Problem: Skin Integrity: Goal: Risk for impaired skin integrity will decrease Outcome: Progressing   

## 2020-03-25 NOTE — Discharge Summary (Signed)
Physician Discharge Summary  Bianca [name] NLZ:[id number] DOB: 02-26-1946 DOA: 03/09/2020  PCP: Julian Hy, PA-C  Admit date: 03/09/2020 Discharge date: 03/25/2020  Admitted From: Home Disposition: Home with home hospice  Recommendations for Outpatient Follow-up:  1. Follow ups as below. 2. Please obtain CBC/BMP/Mag at follow up 3. Please follow up on the following pending results: None  Discharge Condition: Guarded prognosis CODE STATUS: DNR/DNI   Follow-up Information    [facility] up.   Specialty: PALLIATIVE CARE Why: hospice care  Contact information: [facility] Course: 74 year old female with history of CAD/CABG on 02/08/2020, DM-2, HTN and recent hospitalization from 5/17-5/25 for wound dehiscence and leg swelling presenting with poor p.o. intake and emesis, and admitted for hypotension, AKI and FTT/severe protein calorie malnutrition.  She was started on IV fluid.  Patient had CT head and MRI.  That revealed multiple subacute/chronic infarcts including globus pallidus, right parietal lobe and bilateral frontal lobes.  Bilateral CUS without significant finding.  Echo with mildly reduced EF.  Neurology consulted.  CTA head and neck without flow obstructing stenosis.  Patient was noted to have some purulent discharge from sternotomy wound site.  Started on IV vancomycin.  Cardiothoracic surgery consulted.  Wound culture obtained and grew Serratia marcescens resistant to Ancef and gentamicin.  She was transitioned to IV cefepime per pharmacy recommendation.  Patient continued to do poorly from p.o. intake standpoint.  Eventually started on tube feed via cortrak on 03/21/2020.  Also started on IV Lasix with IV albumin.  Patient noted to have worsening swelling in her right thigh.  CT revealed pyomyositis.  General surgery consulted.  Surgical intervention deferred  after risk-benefit discussion. Patient and family decided to pursue comfort measures.  CODE STATUS changed to DNR/DNI.  Appropriate DME delivered by hospice. Tube feed and cortrak discontinued.  Patient discharged on palliative medications.  Rxs printed.  Discharge Diagnoses:  Comfort measures care only/DNR/DNI -Discharged home with home hospice on Roxanol, Ativan, Zofran and glycopyrrolate -Appropriate DME delivered by hospice.  Failure to thrive/severe malnutrition/poor p.o. intake in the setting of subacute CVA in a patient with recent CABG. Significant hypoalbuminemia. It is unclear if hypotension in the setting of poor p.o. intake led to CVA or vice versa.  Right thigh pyomyositis/cellulitis-RLE CT with contrast concerning for pyomyositis with cellulitis -Surgical intervention deferred after risk-benefit discussion  CAD/CABG on 06/12/4096 complicated by RLE infection, RLE wound dehiscence and delayed healing of sternotomy wound  Hypotension/dehydration in the setting of the above: Echo with EF of 45 to 50%. TSH and cortisol level within normal.  Subacute/chronic CVA in multiple territories: Likely ischemic insult in the setting of hypotension and atherosclerotic changes.  CTA head and neck without flow obstructing stenosis.  Acute on chronic systolic CHF: Echo with EF of 45 to 50% and RVSP to 25.3.  Unknown diastolic function.  Looks fluid overloaded with significant RLE edema.  Responded to IV Lasix  Uncontrolled DM-2 with hypoglycemia: A1c 7.1% on 02/25/2020  Hypokalemia/hypophosphatemia: Resolved  Iron deficiency anemia/anemia of chronic disease: Baseline Hgb 7-8>> 7.3>7.1>1u> 9.3 (exaggerated)>7.8>7.4  Iron sat 6%.  TIBC 123 (low).  Received IV Feraheme and 1 unit of blood transfusion  Hyperlipidemia: LDL down from 152-22 in late in 3 months.  Leukocytosis/bandemia: Likely due to infectious process as above.  Constipation:  -Scheduled MiraLAX and Colace  daily  Multiple thyroid nodules  Debility/generalized  weakness/physical deconditioning-multifactorial including CAD, hypertension, FTT and anemia  Nutrition Problem: Severe Malnutrition Etiology: acute illness (CAD s/p CABG)  Signs/Symptoms: energy intake < or equal to 75% for > or equal to 1 month, mild fat depletion, moderate fat depletion, moderate muscle depletion, mild muscle depletion, percent weight loss Percent weight loss: 19.9 %  Interventions: Glucerna shake, MVI, Magic cup, Premier Protein   Pressure Injury 03/12/20 Coccyx Mid Stage 2 -  Partial thickness loss of dermis presenting as a shallow open injury with a red, pink wound bed without slough. (Active)  03/12/20 0930  Location: Coccyx  Location Orientation: Mid  Staging: Stage 2 -  Partial thickness loss of dermis presenting as a shallow open injury with a red, pink wound bed without slough.  Wound Description (Comments):   Present on Admission:     Discharge Exam: Vitals:   03/24/20 0814 03/25/20 0601  BP: 139/66 (!) 155/69  Pulse: 95 (!) 106  Resp: (!) 24 19  Temp: 97.9 F (36.6 C) 98.7 F (37.1 C)  SpO2: 98% 99%    GENERAL: Frail and chronically ill-appearing. HEENT: MMM.  Vision and hearing grossly intact.  NECK: Supple.  No apparent JVD.  RESP: On room air.  No IWOB.  Fair aeration bilaterally. CVS:  RRR. Heart sounds normal.  ABD/GI/GU: Bowel sounds present. Soft. Non tender.  MSK/EXT:  2+ pitting edema in RLE.  Induration and skin erythema in her right thigh proximally. We expressed about 30-50 cc purulent material with gentle pressure, and dressed with dray guaze.  SKIN: Erythema and skin induration as above.  Stage II sacral wound NEURO: Awake, alert and oriented fairly.  No apparent focal neuro deficit. PSYCH: Calm.  No distress or agitation.  Discharge Instructions   Allergies as of 03/25/2020      Reactions   Tramadol Nausea And Vomiting      Medication List    STOP taking these  medications   acetaminophen 325 MG tablet Commonly known as: TYLENOL   Adult Blood Pressure Cuff Lg Kit   amLODipine 5 MG tablet Commonly known as: NORVASC   aspirin 325 MG EC tablet   atorvastatin 80 MG tablet Commonly known as: LIPITOR   bismuth subsalicylate 701 XB/93JQ suspension Commonly known as: PEPTO BISMOL   calcium carbonate 500 MG chewable tablet Commonly known as: TUMS - dosed in mg elemental calcium   carvedilol 25 MG tablet Commonly known as: COREG   dimenhyDRINATE 50 MG tablet Commonly known as: DRAMAMINE   feeding supplement (GLUCERNA SHAKE) Liqd   ferrous [id number] C-folic acid capsule Commonly known as: TRINSICON / FOLTRIN   furosemide 40 MG tablet Commonly known as: LASIX   glucose blood test strip Commonly known as: Contour Next Test   glucose monitoring kit monitoring kit   ibuprofen 200 MG tablet Commonly known as: ADVIL   insulin glargine 100 UNIT/ML injection Commonly known as: LANTUS   loratadine 10 MG tablet Commonly known as: CLARITIN   losartan 50 MG tablet Commonly known as: COZAAR   Microlet Lancets Misc   potassium chloride SA 20 MEQ tablet Commonly known as: KLOR-CON     TAKE these medications   glycopyrrolate 1 MG tablet Commonly known as: ROBINUL Take 1 tablet (1 mg total) by mouth 3 (three) times daily as needed (Oropharyngeal secretions).   LORazepam 0.5 MG tablet Commonly known as: Ativan Take 1 tablet (0.5 mg total) by mouth every 4 (four) hours as needed for up to 20 doses for anxiety.   morphine  CONCENTRATE 10 mg / 0.5 ml concentrated solution Take 0.5 mLs (10 mg total) by mouth every 3 (three) hours as needed for moderate pain, severe pain or shortness of breath.   ondansetron 4 MG tablet Commonly known as: Zofran Take 1 tablet (4 mg total) by mouth daily as needed for nausea or vomiting.            Durable Medical Equipment  (From admission, onward)         Start     Ordered    03/10/20 1454  For home use only DME Hospital bed  Once       Comments: Call son Shellee Milo [phone] for delivery  Ht 5'6", wt 64 kg  Question Answer Comment  Length of Need Lifetime   Patient has (list medical condition): Hypotension due to hypovolemia   The above medical condition requires: Patient requires the ability to reposition frequently   Head must be elevated greater than: 45 degrees   Bed type Semi-electric   Hoyer Lift Yes   Support Surface: Gel Overlay      03/10/20 1454   03/10/20 1452  For home use only DME lightweight manual wheelchair with seat cushion  Once       Comments: Call son Shellee Milo [phone] for delivery   Ht 5'6" and wt 64 kg     Patient suffers from  Hypotension due to hypovolemia which impairs their ability to perform daily activities like ambulating  in the home.  A cane  will not resolve  issue with performing activities of daily living. A wheelchair will allow patient to safely perform daily activities. Patient is not able to propel themselves in the home using a standard weight wheelchair due to  Hypotension due to hypovolemia. Patient can self propel in the lightweight wheelchair. Length of need lifetime . Accessories: elevating leg rests (ELRs), wheel locks, extensions and anti-tippers.  Seat and back cushions   03/10/20 1454          Consultations:  Cardiothoracic surgery  General surgery  Palliative medicine  Procedures/Studies:   CT ABDOMEN PELVIS WO CONTRAST  Result Date: 03/09/2020 CLINICAL DATA:  Vomiting. Constipation. Concern for bowel obstruction. EXAM: CT ABDOMEN AND PELVIS WITHOUT CONTRAST TECHNIQUE: Multidetector CT imaging of the abdomen and pelvis was performed following the standard protocol without IV contrast. COMPARISON:  None. FINDINGS: Lower chest: Small dependent left pleural effusion. Mild cardiomegaly. Intact lower sternotomy wires. Hepatobiliary: Normal liver size. No liver mass. Normal  gallbladder with no radiopaque cholelithiasis. No biliary ductal dilatation. Pancreas: Normal, with no mass or duct dilation. Spleen: Normal size. No mass. Adrenals/Urinary Tract: No discrete adrenal nodules. No renal stones. No hydronephrosis. No contour deforming renal masses. Normal bladder. Stomach/Bowel: Normal non-distended stomach. Normal caliber small bowel with no small bowel wall thickening. Normal appendix. Moderate diffuse colonic diverticulosis, most prominent in sigmoid colon, with no large bowel wall thickening or significant pericolonic fat stranding. Vascular/Lymphatic: Atherosclerotic nonaneurysmal abdominal aorta. No pathologically enlarged lymph nodes in the abdomen or pelvis. Reproductive: Coarsely calcified degenerated 1.6 cm uterine fibroid. No adnexal masses. Other: No pneumoperitoneum, ascites or focal fluid collection. Musculoskeletal: No aggressive appearing focal osseous lesions. Moderate lumbar spondylosis. IMPRESSION: 1. No acute abnormality. No evidence of bowel obstruction or acute bowel inflammation. Moderate diffuse colonic diverticulosis, with no evidence of acute diverticulitis. 2. Small dependent left pleural effusion. 3. Mild cardiomegaly. 4. Aortic Atherosclerosis (ICD10-I70.0). Electronically Signed   By: Ilona Sorrel M.D.   On:  03/09/2020 06:15   CT ANGIO HEAD W OR WO CONTRAST  Result Date: 03/19/2020 CLINICAL DATA:  Stroke follow-up EXAM: CT ANGIOGRAPHY HEAD AND NECK TECHNIQUE: Multidetector CT imaging of the head and neck was performed using the standard protocol during bolus administration of intravenous contrast. Multiplanar CT image reconstructions and MIPs were obtained to evaluate the vascular anatomy. Carotid stenosis measurements (when applicable) are obtained utilizing NASCET criteria, using the distal internal carotid diameter as the denominator. CONTRAST:  26m OMNIPAQUE IOHEXOL 350 MG/ML SOLN COMPARISON:  MRI of the brain March 17, 2020 FINDINGS: CT HEAD  FINDINGS Brain: No evidence of acute infarction, hemorrhage, hydrocephalus, extra-axial collection or mass lesion/mass effect. Hypodense focus within the genu of the left internal capsule/lentiform nucleus, corresponding to subacute to chronic infarct seen on prior MRI. Vascular: Calcified plaques are seen in the bilateral carotid siphons. Skull: Normal. Negative for fracture or focal lesion. Sinuses: Imaged portions are clear. Orbits: No acute finding. Review of the MIP images confirms the above findings CTA NECK FINDINGS Aortic arch: Common origin of the left common carotid and innominate artery from the aortic arch. The left vertebral artery originates directly from the aortic arch, just proximal to the origin of the left subclavian artery. Postsurgical changes from triple cardiac bypass. The imaged portion of the aorta shows no evidence of aneurysm or dissection. Calcified plaques are seen in the aortic arch. No significant stenosis of the major arch vessel origins. Right carotid system: Calcified plaque is seen in the right carotid bulb without hemodynamically significant stenosis. Left carotid system: Mixed density plaque with tiny ulceration (series 12, image 115) in the left carotid bifurcation without hemodynamically significant stenosis. Vertebral arteries: The dominant left vertebral artery originates directly from the aortic arch (anatomical variant) with normal caliber throughout the cervical segment. The right vertebral artery is diminutive at its origin and along the V1 segment. Multifocal areas of stenosis in ectasias are seen along the V2 and V3 segments. Skeleton: Degenerative changes of the cervical spine noted. No focal aggressive lesion identified. Other neck: Multiple thyroid nodules the largest in the right lobe with heterogeneous density and peripheral coarse calcification measuring 1.5 cm. Upper chest: Left pleural effusion. Review of the MIP images confirms the above findings CTA HEAD  FINDINGS Anterior circulation: No significant stenosis, proximal occlusion, aneurysm, or vascular malformation. However, evaluation of the intracranial ICA and proximal MCA limited by motion. Posterior circulation: No significant stenosis, proximal occlusion, aneurysm, or vascular malformation. Venous sinuses: As permitted by contrast timing, patent. Review of the MIP images confirms the above findings IMPRESSION: 1. Hypodense focus within the genu of the left internal capsule/lentiform nucleus, corresponding to subacute to chronic infarct seen on prior MRI. 2. Mild atherosclerotic disease in the left carotid bulb with tiny ulceration. No hemodynamically significant stenosis in the neck. 3. No large vessel occlusion. 4. Multifocal areas of stenosis along the cervical right vertebral artery with dominant left vertebral artery. 5. Multiple thyroid nodules the largest in the right lobe with heterogeneous density and peripheral coarse calcification measuring 1.5 cm. Recommend nonemergent thyroid ultrasound for further evaluation. 6. Left pleural effusion. Aortic Atherosclerosis (ICD10-I70.0). Electronically Signed   By: KPedro EarlsM.D.   On: 03/19/2020 13:30   CT HEAD WO CONTRAST  Result Date: 03/15/2020 CLINICAL DATA:  Unresolved mental status change since surgery 02/08/2020 EXAM: CT HEAD WITHOUT CONTRAST TECHNIQUE: Contiguous axial images were obtained from the base of the skull through the vertex without intravenous contrast. COMPARISON:  918 FINDINGS: Brain: 9  mm hypodensity within the left basal ganglia is is compatible with age indeterminate lacunar infarct, favor subacute. No other signs of acute infarct or hemorrhage. Lateral ventricles and remaining midline structures are unremarkable. No acute extra-axial fluid collections. No mass effect. Vascular: No hyperdense vessel or unexpected calcification. Skull: Normal. Negative for fracture or focal lesion. Sinuses/Orbits: No acute finding.  Other: None. IMPRESSION: 1. Left basal ganglia hypodensity, favor subacute infarct. 2. Otherwise unremarkable exam. Electronically Signed   By: Randa Ngo M.D.   On: 03/15/2020 21:25   CT ANGIO NECK W OR WO CONTRAST  Result Date: 03/19/2020 CLINICAL DATA:  Stroke follow-up EXAM: CT ANGIOGRAPHY HEAD AND NECK TECHNIQUE: Multidetector CT imaging of the head and neck was performed using the standard protocol during bolus administration of intravenous contrast. Multiplanar CT image reconstructions and MIPs were obtained to evaluate the vascular anatomy. Carotid stenosis measurements (when applicable) are obtained utilizing NASCET criteria, using the distal internal carotid diameter as the denominator. CONTRAST:  93m OMNIPAQUE IOHEXOL 350 MG/ML SOLN COMPARISON:  MRI of the brain March 17, 2020 FINDINGS: CT HEAD FINDINGS Brain: No evidence of acute infarction, hemorrhage, hydrocephalus, extra-axial collection or mass lesion/mass effect. Hypodense focus within the genu of the left internal capsule/lentiform nucleus, corresponding to subacute to chronic infarct seen on prior MRI. Vascular: Calcified plaques are seen in the bilateral carotid siphons. Skull: Normal. Negative for fracture or focal lesion. Sinuses: Imaged portions are clear. Orbits: No acute finding. Review of the MIP images confirms the above findings CTA NECK FINDINGS Aortic arch: Common origin of the left common carotid and innominate artery from the aortic arch. The left vertebral artery originates directly from the aortic arch, just proximal to the origin of the left subclavian artery. Postsurgical changes from triple cardiac bypass. The imaged portion of the aorta shows no evidence of aneurysm or dissection. Calcified plaques are seen in the aortic arch. No significant stenosis of the major arch vessel origins. Right carotid system: Calcified plaque is seen in the right carotid bulb without hemodynamically significant stenosis. Left carotid system:  Mixed density plaque with tiny ulceration (series 12, image 115) in the left carotid bifurcation without hemodynamically significant stenosis. Vertebral arteries: The dominant left vertebral artery originates directly from the aortic arch (anatomical variant) with normal caliber throughout the cervical segment. The right vertebral artery is diminutive at its origin and along the V1 segment. Multifocal areas of stenosis in ectasias are seen along the V2 and V3 segments. Skeleton: Degenerative changes of the cervical spine noted. No focal aggressive lesion identified. Other neck: Multiple thyroid nodules the largest in the right lobe with heterogeneous density and peripheral coarse calcification measuring 1.5 cm. Upper chest: Left pleural effusion. Review of the MIP images confirms the above findings CTA HEAD FINDINGS Anterior circulation: No significant stenosis, proximal occlusion, aneurysm, or vascular malformation. However, evaluation of the intracranial ICA and proximal MCA limited by motion. Posterior circulation: No significant stenosis, proximal occlusion, aneurysm, or vascular malformation. Venous sinuses: As permitted by contrast timing, patent. Review of the MIP images confirms the above findings IMPRESSION: 1. Hypodense focus within the genu of the left internal capsule/lentiform nucleus, corresponding to subacute to chronic infarct seen on prior MRI. 2. Mild atherosclerotic disease in the left carotid bulb with tiny ulceration. No hemodynamically significant stenosis in the neck. 3. No large vessel occlusion. 4. Multifocal areas of stenosis along the cervical right vertebral artery with dominant left vertebral artery. 5. Multiple thyroid nodules the largest in the right lobe with heterogeneous  density and peripheral coarse calcification measuring 1.5 cm. Recommend nonemergent thyroid ultrasound for further evaluation. 6. Left pleural effusion. Aortic Atherosclerosis (ICD10-I70.0). Electronically Signed    By: Pedro Earls M.D.   On: 03/19/2020 13:30   MR BRAIN WO CONTRAST  Result Date: 03/17/2020 CLINICAL DATA:  Altered mental status and lethargy EXAM: MRI HEAD WITHOUT CONTRAST TECHNIQUE: Multiplanar, multiecho pulse sequences of the brain and surrounding structures were obtained without intravenous contrast. COMPARISON:  Two days ago FINDINGS: Brain: Small diffusion hyperintensities with facilitated appearance on ADC map, seen in the right low parietal region and bifrontal white matter. Similar diffusion signal at the left globus pallidus except for superimposed mineralization. No truly restricted lesion for acute infarct. No acute hemorrhage, hydrocephalus, or masslike finding. Branching T2 hyperintensity in the right pons, likely a DVA in this location. No associated masslike swelling or volume loss. Age normal brain volume. Vascular: Normal flow voids Skull and upper cervical spine: Normal marrow signal Sinuses/Orbits: Bilateral cataract resection IMPRESSION: 1. Small subacute to chronic infarcts in the bilateral cerebral white matter and left globus pallidus. 2. Branching signal abnormality in the right pons, likely a DVA. Electronically Signed   By: Monte Fantasia M.D.   On: 03/17/2020 11:06   DG Chest Port 1 View  Result Date: 03/09/2020 CLINICAL DATA:  Lethargy EXAM: PORTABLE CHEST 1 VIEW COMPARISON:  None. FINDINGS: There is mild cardiomegaly. Overlying median sternotomy wires are present. Aortic knob calcifications are seen. A small left pleural effusion is present. The right lung is clear. No focal airspace consolidation. No acute osseous abnormality. IMPRESSION: Small left pleural effusion.  For Electronically Signed   By: Prudencio Pair M.D.   On: 03/09/2020 02:41   DG CHEST PORT 1 VIEW  Result Date: 02/25/2020 CLINICAL DATA:  Postop EXAM: PORTABLE CHEST 1 VIEW COMPARISON:  02/12/2020 FINDINGS: Changes of CABG. Interval removal of right central line. Cardiomegaly. Bibasilar  atelectasis with small bilateral effusions. No pneumothorax or acute bony abnormality. IMPRESSION: Bibasilar atelectasis, small bilateral effusions. Electronically Signed   By: Rolm Baptise M.D.   On: 02/25/2020 17:25   ECHOCARDIOGRAM COMPLETE  Result Date: 03/18/2020    ECHOCARDIOGRAM REPORT   Patient Name:   Bianca [name] Date of Exam: 03/18/2020 Medical Rec #:  [id number]        Height:       66.0 in Accession #:    [id number]       Weight:       141.1 lb Date of Birth:  07/19/1946        BSA:          1.724 m Patient Age:    2 years         BP:           144/74 mmHg Patient Gender: F                HR:           103 bpm. Exam Location:  Inpatient Procedure: 2D Echo, Cardiac Doppler and Color Doppler Indications:    Stroke 434.91 / I163.9  History:        Patient has prior history of Echocardiogram examinations, most                 recent 02/08/2020. CHF, CAD, Prior CABG, Arrythmias:non-specific                 ST changes, Signs/Symptoms:Chest Pain; Risk  Factors:Hypertension, Diabetes, Dyslipidemia and Former Smoker.  Sonographer:    Vickie Epley RDCS Referring Phys: [phone] AVA SWAYZE  Sonographer Comments: Suboptimal apical window. IMPRESSIONS  1. Left ventricular ejection fraction, by estimation, is 45 to 50%. The left ventricle has mildly decreased function. There is moderate left ventricular hypertrophy. Left ventricular diastolic parameters are indeterminate.  2. Right ventricular systolic function was not well visualized. The right ventricular size is normal. There is normal pulmonary artery systolic pressure. The estimated right ventricular systolic pressure is 14.9 mmHg.  3. The mitral valve is normal in structure. Trivial mitral valve regurgitation.  4. Tricuspid valve regurgitation is mild to moderate.  5. The aortic valve is tricuspid. Aortic valve regurgitation is not visualized. Mild aortic valve sclerosis is present, with no evidence of aortic valve stenosis.  6. Aortic dilatation  noted. There is mild dilatation of the ascending aorta measuring 36 mm.  7. The inferior vena cava is normal in size with greater than 50% respiratory variability, suggesting right atrial pressure of 3 mmHg. FINDINGS  Left Ventricle: Left ventricular ejection fraction, by estimation, is 45 to 50%. The left ventricle has mildly decreased function. Left ventricular endocardial border not optimally defined to evaluate regional wall motion. The left ventricular internal cavity size was normal in size. There is moderate left ventricular hypertrophy. Left ventricular diastolic parameters are indeterminate. Right Ventricle: The right ventricular size is normal. Right vetricular wall thickness was not assessed. Right ventricular systolic function was not well visualized. There is normal pulmonary artery systolic pressure. The tricuspid regurgitant velocity is 2.36 m/s, and with an assumed right atrial pressure of 3 mmHg, the estimated right ventricular systolic pressure is 70.2 mmHg. Left Atrium: Left atrial size was normal in size. Right Atrium: Right atrial size was not well visualized. Pericardium: There is no evidence of pericardial effusion. Mitral Valve: The mitral valve is normal in structure. Trivial mitral valve regurgitation. Tricuspid Valve: The tricuspid valve is normal in structure. Tricuspid valve regurgitation is mild to moderate. Aortic Valve: The aortic valve is tricuspid. Aortic valve regurgitation is not visualized. Mild aortic valve sclerosis is present, with no evidence of aortic valve stenosis. Pulmonic Valve: The pulmonic valve was normal in structure. Pulmonic valve regurgitation is trivial. Aorta: The aortic root is normal in size and structure and aortic dilatation noted. There is mild dilatation of the ascending aorta measuring 36 mm. Venous: The inferior vena cava is normal in size with greater than 50% respiratory variability, suggesting right atrial pressure of 3 mmHg. IAS/Shunts: The  interatrial septum was not well visualized.  LEFT VENTRICLE PLAX 2D LVIDd:         4.29 cm LVIDs:         3.38 cm LV PW:         1.10 cm LV IVS:        1.02 cm LVOT diam:     2.00 cm LV SV:         51 LV SV Index:   29 LVOT Area:     3.14 cm  LV Volumes (MOD) LV vol d, MOD A2C: 122.0 ml LV vol d, MOD A4C: 125.0 ml LV vol s, MOD A2C: 74.3 ml LV vol s, MOD A4C: 62.4 ml LV SV MOD A2C:     47.7 ml LV SV MOD A4C:     125.0 ml LV SV MOD BP:      54.5 ml LEFT ATRIUM             Index  LA diam:        3.30 cm 1.91 cm/m LA Vol (A2C):   25.9 ml 15.02 ml/m LA Vol (A4C):   40.6 ml 23.55 ml/m LA Biplane Vol: 33.0 ml 19.14 ml/m  AORTIC VALVE LVOT Vmax:   93.20 cm/s LVOT Vmean:  65.300 cm/s LVOT VTI:    0.161 m  AORTA Ao Root diam: 3.50 cm Ao Asc diam:  3.60 cm TRICUSPID VALVE TR Peak grad:   22.3 mmHg TR Vmax:        236.00 cm/s  SHUNTS Systemic VTI:  0.16 m Systemic Diam: 2.00 cm Oswaldo Milian MD Electronically signed by Oswaldo Milian MD Signature Date/Time: 03/18/2020/11:48:35 AM    Final    CT EXTREMITY LOWER RIGHT W CONTRAST  Result Date: 03/23/2020 CLINICAL DATA:  Right lower leg swelling. EXAM: CT OF THE LOWER RIGHT EXTREMITY WITH CONTRAST TECHNIQUE: Multidetector CT imaging of the lower right extremity was performed according to the standard protocol following intravenous contrast administration. COMPARISON:  None. CONTRAST:  162m OMNIPAQUE IOHEXOL 300 MG/ML  SOLN FINDINGS: Bones/Joint/Cartilage No bony destruction or periosteal reaction. No fracture or dislocation. Mild tricompartmental joint space narrowing of the knee. No joint effusion. Osteopenia. Ligaments Ligaments are suboptimally evaluated by CT. Muscles and Tendons Prominent fluid within and loss of the normal architecture involving the visualized distal semimembranosus and sartorius muscles. Prominent perifascial fluid within the posterior compartment of the distal thigh extending below the knee, with areas of peripheral enhancement (series  5, image 6; series 9, image 73). Remaining muscles are unremarkable. The visualized tendons are grossly intact. Soft tissue Circumferential soft tissue swelling of the lower leg. Soft tissue defect along the medial proximal lower leg. No subcutaneous emphysema. No soft tissue mass. IMPRESSION: 1. Prominent fluid within and loss of the normal architecture involving the visualized distal semimembranosus and sartorius muscles. Associated extensive perifascial fluid within the posterior compartment of the distal thigh and extending below the knee with areas of peripheral enhancement. Findings are concerning for pyomyofascitis. 2. Circumferential soft tissue swelling of the lower leg, consistent with cellulitis. 3. No acute osseous abnormality. Electronically Signed   By: WTitus DubinM.D.   On: 03/23/2020 13:45   VAS [place]  Result Date: 03/17/2020 Carotid Arterial Duplex Study Indications:       CVA. Risk Factors:      Hypertension, Diabetes, coronary artery disease. Comparison Study:  02/06/2020 Carotid artery duplex: bilateral 1-39% ICA                    stenosis. Performing Technologist: MMaudry MayhewMHA, RDMS, RVT, RDCS  Examination Guidelines: A complete evaluation includes B-mode imaging, spectral Doppler, color Doppler, and power Doppler as needed of all accessible portions of each vessel. Bilateral testing is considered an integral part of a complete examination. Limited examinations for reoccurring indications may be performed as noted.  Right Carotid Findings: +----------+--------+--------+--------+-----------------------+--------+             PSV cm/s EDV cm/s Stenosis Plaque Description      Comments  +----------+--------+--------+--------+-----------------------+--------+  CCA Prox   91       12                                                  +----------+--------+--------+--------+-----------------------+--------+  CCA Distal 71       18  heterogenous and smooth            +----------+--------+--------+--------+-----------------------+--------+  ICA Prox   106      35                smooth and heterogenous           +----------+--------+--------+--------+-----------------------+--------+  ICA Distal 104      31                                                  +----------+--------+--------+--------+-----------------------+--------+  ECA        97       10                                                  +----------+--------+--------+--------+-----------------------+--------+ +----------+--------+-------+----------------+-------------------+             PSV cm/s EDV cms Describe         Arm Pressure (mmHG)  +----------+--------+-------+----------------+-------------------+  Subclavian 159              Multiphasic, WNL                      +----------+--------+-------+----------------+-------------------+ +---------+--------+--------+--------------+  Vertebral PSV cm/s EDV cm/s Not identified  +---------+--------+--------+--------------+  Left Carotid Findings: +----------+--------+--------+--------+-----------------------+--------+             PSV cm/s EDV cm/s Stenosis Plaque Description      Comments  +----------+--------+--------+--------+-----------------------+--------+  CCA Prox   115      21                smooth and heterogenous           +----------+--------+--------+--------+-----------------------+--------+  CCA Distal 99       24                                                  +----------+--------+--------+--------+-----------------------+--------+  ICA Prox   64       18                smooth and heterogenous           +----------+--------+--------+--------+-----------------------+--------+  ICA Distal 102      27                                                  +----------+--------+--------+--------+-----------------------+--------+  ECA        109                        smooth and heterogenous            +----------+--------+--------+--------+-----------------------+--------+ +----------+--------+--------+----------------+-------------------+             PSV cm/s EDV cm/s Describe         Arm Pressure (mmHG)  +----------+--------+--------+----------------+-------------------+  Subclavian 118               Multiphasic, WNL                      +----------+--------+--------+----------------+-------------------+ +---------+--------+--+--------+--+---------+  Vertebral PSV cm/s 37 EDV cm/s 12 Antegrade  +---------+--------+--+--------+--+---------+   Summary: Right Carotid: Velocities in the right ICA are consistent with a 1-39% stenosis.                Heterogenous area of the right thyroid measuring 1.5cm, etiology                unknown. Left Carotid: Velocities in the left ICA are consistent with a 1-39% stenosis. Vertebrals:  Left vertebral artery demonstrates antegrade flow. Right vertebral              artery was not visualized. Subclavians: Normal flow hemodynamics were seen in bilateral subclavian              arteries. *See table(s) above for measurements and observations.  Electronically signed by Curt Jews MD on 03/17/2020 at 7:01:38 PM.    Final    VAS [place] LOWER EXTREMITY VENOUS (DVT)  Result Date: 03/19/2020  Lower Venous DVTStudy Indications: Swelling.  Risk Factors: None identified. Limitations: Body habitus, poor ultrasound/tissue interface, open wound, bandages and wound vac, patient positioning, patient pain tolerance. Comparison Study: No prior studies. Performing Technologist: Oliver Hum RVT  Examination Guidelines: A complete evaluation includes B-mode imaging, spectral Doppler, color Doppler, and power Doppler as needed of all accessible portions of each vessel. Bilateral testing is considered an integral part of a complete examination. Limited examinations for reoccurring indications may be performed as noted. The reflux portion of the exam is performed with the patient in reverse  Trendelenburg.  +---------+---------------+---------+-----------+----------+--------------+  RIGHT     Compressibility Phasicity Spontaneity Properties Thrombus Aging  +---------+---------------+---------+-----------+----------+--------------+  CFV       Full            Yes       No                                     +---------+---------------+---------+-----------+----------+--------------+  SFJ       Full                                                             +---------+---------------+---------+-----------+----------+--------------+  FV Prox   Full                                                             +---------+---------------+---------+-----------+----------+--------------+  FV Mid                                                     Not visualized  +---------+---------------+---------+-----------+----------+--------------+  FV Distal                                                  Not visualized  +---------+---------------+---------+-----------+----------+--------------+  PFV  Not visualized  +---------+---------------+---------+-----------+----------+--------------+  POP       Full            Yes       No                                     +---------+---------------+---------+-----------+----------+--------------+  PTV       Full                                                             +---------+---------------+---------+-----------+----------+--------------+  PERO      Full                                                             +---------+---------------+---------+-----------+----------+--------------+   +----+---------------+---------+-----------+----------+--------------+  LEFT Compressibility Phasicity Spontaneity Properties Thrombus Aging  +----+---------------+---------+-----------+----------+--------------+  CFV  Full            Yes       No                                      +----+---------------+---------+-----------+----------+--------------+     Summary: RIGHT: - There is no evidence of deep vein thrombosis in the lower extremity. However, portions of this examination were limited- see technologist comments above.  - No cystic structure found in the popliteal fossa.  LEFT: - No evidence of common femoral vein obstruction.  *See table(s) above for measurements and observations. Electronically signed by Monica Martinez MD on 03/19/2020 at 3:57:56 PM.    Final    VAS [place] LOWER EXTREMITY VENOUS (DVT)  Result Date: 02/25/2020  Lower Venous DVTStudy Indications: Edema.  Comparison Study: No prior study Performing Technologist: Maudry Mayhew MHA, RDMS, RVT, RDCS  Examination Guidelines: A complete evaluation includes B-mode imaging, spectral Doppler, color Doppler, and power Doppler as needed of all accessible portions of each vessel. Bilateral testing is considered an integral part of a complete examination. Limited examinations for reoccurring indications may be performed as noted. The reflux portion of the exam is performed with the patient in reverse Trendelenburg.  +---------+---------------+---------+-----------+----------+--------------+  RIGHT     Compressibility Phasicity Spontaneity Properties Thrombus Aging  +---------+---------------+---------+-----------+----------+--------------+  CFV       Full            No        Yes                                    +---------+---------------+---------+-----------+----------+--------------+  SFJ       Full                                                             +---------+---------------+---------+-----------+----------+--------------+  FV Prox   Full                                                             +---------+---------------+---------+-----------+----------+--------------+  FV Mid                              Yes                                     +---------+---------------+---------+-----------+----------+--------------+  FV Distal                           Yes                                    +---------+---------------+---------+-----------+----------+--------------+  PFV       Full                                                             +---------+---------------+---------+-----------+----------+--------------+  POP                       No        Yes                                    +---------+---------------+---------+-----------+----------+--------------+  PTV       Full                      Yes                                    +---------+---------------+---------+-----------+----------+--------------+  PERO      Full                      Yes                                    +---------+---------------+---------+-----------+----------+--------------+ patient unable to tolerate some compression maneuvers secondary to pain.  +----+---------------+---------+-----------+----------+--------------+  LEFT Compressibility Phasicity Spontaneity Properties Thrombus Aging  +----+---------------+---------+-----------+----------+--------------+  CFV  Full            No        Yes                                    +----+---------------+---------+-----------+----------+--------------+     Summary: RIGHT: - There is no evidence of deep vein thrombosis in the lower extremity. However, portions of this examination were limited- see technologist comments above.  - No cystic structure found in the popliteal fossa.  LEFT: - No  evidence of common femoral vein obstruction.  Pulsatile lower extremity venous flow is suggestive of possibly elevated right heart pressure.  *See table(s) above for measurements and observations. Electronically signed by Monica Martinez MD on 02/25/2020 at 5:57:33 PM.    Final        The results of significant diagnostics from this hospitalization (including imaging, microbiology, ancillary and laboratory) are listed below for  reference.     Microbiology: Recent Results (from the past 240 hour(s))  MRSA PCR Screening     Status: None   Collection Time: 03/19/20 11:08 PM   Specimen: Nasal Mucosa; Nasopharyngeal  Result Value Ref Range Status   MRSA by PCR NEGATIVE NEGATIVE Final    Comment:        The GeneXpert MRSA Assay (FDA approved for NASAL specimens only), is one component of a comprehensive MRSA colonization surveillance program. It is not intended to diagnose MRSA infection nor to guide or monitor treatment for MRSA infections. Performed at [facility], [place] [address], [place], [place] [address]   Aerobic Culture (superficial specimen)     Status: None (Preliminary result)   Collection Time: 03/20/20  5:03 PM   Specimen: Wound  Result Value Ref Range Status   Specimen Description WOUND STERNUM  Final   Special Requests NONE  Final   Gram Stain   Final    FEW WBC PRESENT, PREDOMINANTLY PMN FEW GRAM POSITIVE COCCI IN CLUSTERS RARE GRAM POSITIVE RODS    Culture   Final    FEW SERRATIA MARCESCENS RARE PSEUDOMONAS AERUGINOSA SUSCEPTIBILITIES TO FOLLOW Performed at [facility], [place] [address] [place], [place] [address]    Report Status PENDING  Incomplete   Organism ID, Bacteria SERRATIA MARCESCENS  Final      Susceptibility   Serratia marcescens - MIC*    CEFAZOLIN >=64 RESISTANT Resistant     CEFEPIME <=1 SENSITIVE Sensitive     CEFTAZIDIME 2 SENSITIVE Sensitive     CEFTRIAXONE 8 SENSITIVE Sensitive     CIPROFLOXACIN <=0.25 SENSITIVE Sensitive     GENTAMICIN >=16 RESISTANT Resistant     TRIMETH/SULFA <=20 SENSITIVE Sensitive     * FEW SERRATIA MARCESCENS     Labs: BNP (last 3 results) Recent Labs    02/02/20 1642 03/09/20 0226 03/19/20 1020  BNP 466.5* 504.3* 983.3*   Basic Metabolic Panel: Recent Labs  Lab 03/20/20 0226 03/21/20 0318 03/22/20 1415 03/23/20 0350 03/24/20 0746  NA 135 136 137 139 137  K 3.6 3.4* 3.5 3.7 4.0  CL 103 101 102 104 102    CO2 '25 22 26 27 27  '$ GLUCOSE 63* 85 229* 272* 136*  BUN '10 10 12 14 15  '$ CREATININE 0.66   0.68 0.64   0.67 0.63 0.75 0.62   0.63  CALCIUM 7.8* 8.0* 7.9* 7.8* 7.8*  MG 2.0 2.0 2.0 2.0 1.9  PHOS 3.1 3.3 2.2* 1.9* 2.1*   Liver Function Tests: Recent Labs  Lab 03/20/20 0226 03/21/20 0318 03/22/20 1415 03/23/20 0350 03/24/20 0746  ALBUMIN 1.5* 1.6* 2.0* 1.8* 1.6*   No results for input(s): LIPASE, AMYLASE in the last 168 hours. No results for input(s): AMMONIA in the last 168 hours. CBC: Recent Labs  Lab 03/18/20 1740 03/18/20 1740 03/20/20 0226 03/21/20 0318 03/22/20 1415 03/23/20 0350 03/24/20 0746  WBC 18.4*   < > 15.2* 15.5* 14.5* 14.6* 14.5*  NEUTROABS 15.9*  --  12.7*  --   --   --  11.1*  HGB  7.3*   < > 7.1* 9.3* 7.8* 7.8* 7.4*  HCT 24.3*   < > 23.3* 29.8* 25.1* 25.3* 24.2*  MCV 88.0   < > 87.3 87.4 86.6 87.5 88.0  PLT 349   < > 390 440* 448* 470* 432*   < > = values in this interval not displayed.   Cardiac Enzymes: No results for input(s): CKTOTAL, CKMB, CKMBINDEX, TROPONINI in the last 168 hours. BNP: Invalid input(s): POCBNP CBG: Recent Labs  Lab 03/24/20 1952 03/25/20 0000 03/25/20 0408 03/25/20 0805 03/25/20 1126  GLUCAP 169* 131* 149* 201* 186*   D-Dimer No results for input(s): DDIMER in the last 72 hours. Hgb A1c No results for input(s): HGBA1C in the last 72 hours. Lipid Profile No results for input(s): CHOL, HDL, LDLCALC, TRIG, CHOLHDL, LDLDIRECT in the last 72 hours. Thyroid function studies No results for input(s): TSH, T4TOTAL, T3FREE, THYROIDAB in the last 72 hours.  Invalid input(s): FREET3 Anemia work up No results for input(s): VITAMINB12, FOLATE, FERRITIN, TIBC, IRON, RETICCTPCT in the last 72 hours. Urinalysis    Component Value Date/Time   COLORURINE YELLOW 03/09/2020 0635   APPEARANCEUR HAZY (A) 03/09/2020 0635   LABSPEC 1.020 03/09/2020 0635   PHURINE 5.0 03/09/2020 0635   GLUCOSEU NEGATIVE 03/09/2020 0635   HGBUR  NEGATIVE 03/09/2020 0635   BILIRUBINUR NEGATIVE 03/09/2020 0635   BILIRUBINUR neg 02/20/2015 1040   KETONESUR NEGATIVE 03/09/2020 0635   PROTEINUR 100 (A) 03/09/2020 0635   UROBILINOGEN 0.2 02/20/2015 1040   UROBILINOGEN 1.0 02/04/2015 1350   NITRITE NEGATIVE 03/09/2020 0635   LEUKOCYTESUR NEGATIVE 03/09/2020 0635   Sepsis Labs Invalid input(s): PROCALCITONIN,  WBC,  LACTICIDVEN   Time coordinating discharge: 35 minutes  SIGNED:  Mercy Riding, MD  [facility] 03/25/2020, 12:36 PM  If 7PM-7AM, please contact night-coverage [email or web address] Password [email or web address]

## 2020-03-25 NOTE — Progress Notes (Signed)
AVS given and reviewed with pt's daughter at bedside. Medications discussed and signed printed prescriptions provided. All questions answered to satisfaction. Pt's daughter verbalized understanding of information given. Pt transported home via [facility].

## 2020-03-26 DIAGNOSIS — R32 Unspecified urinary incontinence: Secondary | ICD-10-CM | POA: Diagnosis not present

## 2020-03-26 DIAGNOSIS — M60051 Infective myositis, right thigh: Secondary | ICD-10-CM | POA: Diagnosis not present

## 2020-03-26 DIAGNOSIS — E878 Other disorders of electrolyte and fluid balance, not elsewhere classified: Secondary | ICD-10-CM | POA: Diagnosis not present

## 2020-03-26 DIAGNOSIS — I10 Essential (primary) hypertension: Secondary | ICD-10-CM | POA: Diagnosis not present

## 2020-03-26 DIAGNOSIS — L89152 Pressure ulcer of sacral region, stage 2: Secondary | ICD-10-CM | POA: Diagnosis not present

## 2020-03-26 DIAGNOSIS — I69318 Other symptoms and signs involving cognitive functions following cerebral infarction: Secondary | ICD-10-CM | POA: Diagnosis not present

## 2020-03-26 DIAGNOSIS — D500 Iron deficiency anemia secondary to blood loss (chronic): Secondary | ICD-10-CM | POA: Diagnosis not present

## 2020-03-26 DIAGNOSIS — Z6827 Body mass index (BMI) 27.0-27.9, adult: Secondary | ICD-10-CM | POA: Diagnosis not present

## 2020-03-26 DIAGNOSIS — E041 Nontoxic single thyroid nodule: Secondary | ICD-10-CM | POA: Diagnosis not present

## 2020-03-26 DIAGNOSIS — T8149XD Infection following a procedure, other surgical site, subsequent encounter: Secondary | ICD-10-CM | POA: Diagnosis not present

## 2020-03-26 DIAGNOSIS — E119 Type 2 diabetes mellitus without complications: Secondary | ICD-10-CM | POA: Diagnosis not present

## 2020-03-26 DIAGNOSIS — E785 Hyperlipidemia, unspecified: Secondary | ICD-10-CM | POA: Diagnosis not present

## 2020-03-26 DIAGNOSIS — J302 Other seasonal allergic rhinitis: Secondary | ICD-10-CM | POA: Diagnosis not present

## 2020-03-26 DIAGNOSIS — I25709 Atherosclerosis of coronary artery bypass graft(s), unspecified, with unspecified angina pectoris: Secondary | ICD-10-CM | POA: Diagnosis not present

## 2020-03-26 DIAGNOSIS — B9689 Other specified bacterial agents as the cause of diseases classified elsewhere: Secondary | ICD-10-CM | POA: Diagnosis not present

## 2020-03-26 DIAGNOSIS — R627 Adult failure to thrive: Secondary | ICD-10-CM | POA: Diagnosis not present

## 2020-03-26 DIAGNOSIS — R159 Full incontinence of feces: Secondary | ICD-10-CM | POA: Diagnosis not present

## 2020-03-26 DIAGNOSIS — E43 Unspecified severe protein-calorie malnutrition: Secondary | ICD-10-CM | POA: Diagnosis not present

## 2020-03-26 LAB — AEROBIC CULTURE W GRAM STAIN (SUPERFICIAL SPECIMEN)

## 2020-03-27 DIAGNOSIS — E43 Unspecified severe protein-calorie malnutrition: Secondary | ICD-10-CM | POA: Diagnosis not present

## 2020-03-27 DIAGNOSIS — B9689 Other specified bacterial agents as the cause of diseases classified elsewhere: Secondary | ICD-10-CM | POA: Diagnosis not present

## 2020-03-27 DIAGNOSIS — M60051 Infective myositis, right thigh: Secondary | ICD-10-CM | POA: Diagnosis not present

## 2020-03-27 DIAGNOSIS — I69318 Other symptoms and signs involving cognitive functions following cerebral infarction: Secondary | ICD-10-CM | POA: Diagnosis not present

## 2020-03-27 DIAGNOSIS — I25709 Atherosclerosis of coronary artery bypass graft(s), unspecified, with unspecified angina pectoris: Secondary | ICD-10-CM | POA: Diagnosis not present

## 2020-03-27 DIAGNOSIS — T8149XD Infection following a procedure, other surgical site, subsequent encounter: Secondary | ICD-10-CM | POA: Diagnosis not present

## 2020-03-28 ENCOUNTER — Other Ambulatory Visit: Payer: Self-pay | Admitting: Thoracic Surgery (Cardiothoracic Vascular Surgery)

## 2020-03-28 DIAGNOSIS — E43 Unspecified severe protein-calorie malnutrition: Secondary | ICD-10-CM | POA: Diagnosis not present

## 2020-03-28 DIAGNOSIS — B9689 Other specified bacterial agents as the cause of diseases classified elsewhere: Secondary | ICD-10-CM | POA: Diagnosis not present

## 2020-03-28 DIAGNOSIS — M60051 Infective myositis, right thigh: Secondary | ICD-10-CM | POA: Diagnosis not present

## 2020-03-28 DIAGNOSIS — T8149XD Infection following a procedure, other surgical site, subsequent encounter: Secondary | ICD-10-CM | POA: Diagnosis not present

## 2020-03-28 DIAGNOSIS — I69318 Other symptoms and signs involving cognitive functions following cerebral infarction: Secondary | ICD-10-CM | POA: Diagnosis not present

## 2020-03-28 DIAGNOSIS — I25709 Atherosclerosis of coronary artery bypass graft(s), unspecified, with unspecified angina pectoris: Secondary | ICD-10-CM | POA: Diagnosis not present

## 2020-03-29 DIAGNOSIS — B9689 Other specified bacterial agents as the cause of diseases classified elsewhere: Secondary | ICD-10-CM | POA: Diagnosis not present

## 2020-03-29 DIAGNOSIS — T8149XD Infection following a procedure, other surgical site, subsequent encounter: Secondary | ICD-10-CM | POA: Diagnosis not present

## 2020-03-29 DIAGNOSIS — I69318 Other symptoms and signs involving cognitive functions following cerebral infarction: Secondary | ICD-10-CM | POA: Diagnosis not present

## 2020-03-29 DIAGNOSIS — M60051 Infective myositis, right thigh: Secondary | ICD-10-CM | POA: Diagnosis not present

## 2020-03-29 DIAGNOSIS — E43 Unspecified severe protein-calorie malnutrition: Secondary | ICD-10-CM | POA: Diagnosis not present

## 2020-03-29 DIAGNOSIS — I25709 Atherosclerosis of coronary artery bypass graft(s), unspecified, with unspecified angina pectoris: Secondary | ICD-10-CM | POA: Diagnosis not present

## 2020-03-31 DIAGNOSIS — E43 Unspecified severe protein-calorie malnutrition: Secondary | ICD-10-CM | POA: Diagnosis not present

## 2020-03-31 DIAGNOSIS — B9689 Other specified bacterial agents as the cause of diseases classified elsewhere: Secondary | ICD-10-CM | POA: Diagnosis not present

## 2020-03-31 DIAGNOSIS — I69318 Other symptoms and signs involving cognitive functions following cerebral infarction: Secondary | ICD-10-CM | POA: Diagnosis not present

## 2020-03-31 DIAGNOSIS — T8149XD Infection following a procedure, other surgical site, subsequent encounter: Secondary | ICD-10-CM | POA: Diagnosis not present

## 2020-03-31 DIAGNOSIS — M60051 Infective myositis, right thigh: Secondary | ICD-10-CM | POA: Diagnosis not present

## 2020-03-31 DIAGNOSIS — I25709 Atherosclerosis of coronary artery bypass graft(s), unspecified, with unspecified angina pectoris: Secondary | ICD-10-CM | POA: Diagnosis not present

## 2020-04-01 DIAGNOSIS — I25709 Atherosclerosis of coronary artery bypass graft(s), unspecified, with unspecified angina pectoris: Secondary | ICD-10-CM | POA: Diagnosis not present

## 2020-04-01 DIAGNOSIS — B9689 Other specified bacterial agents as the cause of diseases classified elsewhere: Secondary | ICD-10-CM | POA: Diagnosis not present

## 2020-04-01 DIAGNOSIS — I69318 Other symptoms and signs involving cognitive functions following cerebral infarction: Secondary | ICD-10-CM | POA: Diagnosis not present

## 2020-04-01 DIAGNOSIS — E43 Unspecified severe protein-calorie malnutrition: Secondary | ICD-10-CM | POA: Diagnosis not present

## 2020-04-01 DIAGNOSIS — T8149XD Infection following a procedure, other surgical site, subsequent encounter: Secondary | ICD-10-CM | POA: Diagnosis not present

## 2020-04-01 DIAGNOSIS — M60051 Infective myositis, right thigh: Secondary | ICD-10-CM | POA: Diagnosis not present

## 2020-04-02 DIAGNOSIS — I25709 Atherosclerosis of coronary artery bypass graft(s), unspecified, with unspecified angina pectoris: Secondary | ICD-10-CM | POA: Diagnosis not present

## 2020-04-02 DIAGNOSIS — T8149XD Infection following a procedure, other surgical site, subsequent encounter: Secondary | ICD-10-CM | POA: Diagnosis not present

## 2020-04-02 DIAGNOSIS — I69318 Other symptoms and signs involving cognitive functions following cerebral infarction: Secondary | ICD-10-CM | POA: Diagnosis not present

## 2020-04-02 DIAGNOSIS — M60051 Infective myositis, right thigh: Secondary | ICD-10-CM | POA: Diagnosis not present

## 2020-04-02 DIAGNOSIS — E43 Unspecified severe protein-calorie malnutrition: Secondary | ICD-10-CM | POA: Diagnosis not present

## 2020-04-02 DIAGNOSIS — B9689 Other specified bacterial agents as the cause of diseases classified elsewhere: Secondary | ICD-10-CM | POA: Diagnosis not present

## 2020-04-03 ENCOUNTER — Encounter: Payer: Medicare Other | Admitting: Thoracic Surgery (Cardiothoracic Vascular Surgery)

## 2020-04-03 DIAGNOSIS — I69318 Other symptoms and signs involving cognitive functions following cerebral infarction: Secondary | ICD-10-CM | POA: Diagnosis not present

## 2020-04-03 DIAGNOSIS — M60051 Infective myositis, right thigh: Secondary | ICD-10-CM | POA: Diagnosis not present

## 2020-04-03 DIAGNOSIS — E43 Unspecified severe protein-calorie malnutrition: Secondary | ICD-10-CM | POA: Diagnosis not present

## 2020-04-03 DIAGNOSIS — B9689 Other specified bacterial agents as the cause of diseases classified elsewhere: Secondary | ICD-10-CM | POA: Diagnosis not present

## 2020-04-03 DIAGNOSIS — I25709 Atherosclerosis of coronary artery bypass graft(s), unspecified, with unspecified angina pectoris: Secondary | ICD-10-CM | POA: Diagnosis not present

## 2020-04-03 DIAGNOSIS — T8149XD Infection following a procedure, other surgical site, subsequent encounter: Secondary | ICD-10-CM | POA: Diagnosis not present

## 2020-04-04 DIAGNOSIS — B9689 Other specified bacterial agents as the cause of diseases classified elsewhere: Secondary | ICD-10-CM | POA: Diagnosis not present

## 2020-04-04 DIAGNOSIS — I25709 Atherosclerosis of coronary artery bypass graft(s), unspecified, with unspecified angina pectoris: Secondary | ICD-10-CM | POA: Diagnosis not present

## 2020-04-04 DIAGNOSIS — I69318 Other symptoms and signs involving cognitive functions following cerebral infarction: Secondary | ICD-10-CM | POA: Diagnosis not present

## 2020-04-04 DIAGNOSIS — M60051 Infective myositis, right thigh: Secondary | ICD-10-CM | POA: Diagnosis not present

## 2020-04-04 DIAGNOSIS — I10 Essential (primary) hypertension: Secondary | ICD-10-CM | POA: Diagnosis not present

## 2020-04-04 DIAGNOSIS — E43 Unspecified severe protein-calorie malnutrition: Secondary | ICD-10-CM | POA: Diagnosis not present

## 2020-04-04 DIAGNOSIS — R627 Adult failure to thrive: Secondary | ICD-10-CM | POA: Diagnosis not present

## 2020-04-04 DIAGNOSIS — I2510 Atherosclerotic heart disease of native coronary artery without angina pectoris: Secondary | ICD-10-CM | POA: Diagnosis not present

## 2020-04-04 DIAGNOSIS — L8991 Pressure ulcer of unspecified site, stage 1: Secondary | ICD-10-CM | POA: Diagnosis not present

## 2020-04-04 DIAGNOSIS — T8141XD Infection following a procedure, superficial incisional surgical site, subsequent encounter: Secondary | ICD-10-CM | POA: Diagnosis not present

## 2020-04-04 DIAGNOSIS — T8149XD Infection following a procedure, other surgical site, subsequent encounter: Secondary | ICD-10-CM | POA: Diagnosis not present

## 2020-04-04 DIAGNOSIS — I5030 Unspecified diastolic (congestive) heart failure: Secondary | ICD-10-CM | POA: Diagnosis not present

## 2020-04-07 DIAGNOSIS — M60051 Infective myositis, right thigh: Secondary | ICD-10-CM | POA: Diagnosis not present

## 2020-04-07 DIAGNOSIS — I25709 Atherosclerosis of coronary artery bypass graft(s), unspecified, with unspecified angina pectoris: Secondary | ICD-10-CM | POA: Diagnosis not present

## 2020-04-07 DIAGNOSIS — T8149XD Infection following a procedure, other surgical site, subsequent encounter: Secondary | ICD-10-CM | POA: Diagnosis not present

## 2020-04-07 DIAGNOSIS — B9689 Other specified bacterial agents as the cause of diseases classified elsewhere: Secondary | ICD-10-CM | POA: Diagnosis not present

## 2020-04-07 DIAGNOSIS — I69318 Other symptoms and signs involving cognitive functions following cerebral infarction: Secondary | ICD-10-CM | POA: Diagnosis not present

## 2020-04-07 DIAGNOSIS — E43 Unspecified severe protein-calorie malnutrition: Secondary | ICD-10-CM | POA: Diagnosis not present

## 2020-04-08 DIAGNOSIS — M60051 Infective myositis, right thigh: Secondary | ICD-10-CM | POA: Diagnosis not present

## 2020-04-08 DIAGNOSIS — B9689 Other specified bacterial agents as the cause of diseases classified elsewhere: Secondary | ICD-10-CM | POA: Diagnosis not present

## 2020-04-08 DIAGNOSIS — T8149XD Infection following a procedure, other surgical site, subsequent encounter: Secondary | ICD-10-CM | POA: Diagnosis not present

## 2020-04-08 DIAGNOSIS — I69318 Other symptoms and signs involving cognitive functions following cerebral infarction: Secondary | ICD-10-CM | POA: Diagnosis not present

## 2020-04-08 DIAGNOSIS — E43 Unspecified severe protein-calorie malnutrition: Secondary | ICD-10-CM | POA: Diagnosis not present

## 2020-04-08 DIAGNOSIS — I25709 Atherosclerosis of coronary artery bypass graft(s), unspecified, with unspecified angina pectoris: Secondary | ICD-10-CM | POA: Diagnosis not present

## 2020-04-09 DIAGNOSIS — B9689 Other specified bacterial agents as the cause of diseases classified elsewhere: Secondary | ICD-10-CM | POA: Diagnosis not present

## 2020-04-09 DIAGNOSIS — E43 Unspecified severe protein-calorie malnutrition: Secondary | ICD-10-CM | POA: Diagnosis not present

## 2020-04-09 DIAGNOSIS — M60051 Infective myositis, right thigh: Secondary | ICD-10-CM | POA: Diagnosis not present

## 2020-04-09 DIAGNOSIS — T8149XD Infection following a procedure, other surgical site, subsequent encounter: Secondary | ICD-10-CM | POA: Diagnosis not present

## 2020-04-09 DIAGNOSIS — I25709 Atherosclerosis of coronary artery bypass graft(s), unspecified, with unspecified angina pectoris: Secondary | ICD-10-CM | POA: Diagnosis not present

## 2020-04-09 DIAGNOSIS — I69318 Other symptoms and signs involving cognitive functions following cerebral infarction: Secondary | ICD-10-CM | POA: Diagnosis not present

## 2020-04-10 DIAGNOSIS — E119 Type 2 diabetes mellitus without complications: Secondary | ICD-10-CM | POA: Diagnosis not present

## 2020-04-10 DIAGNOSIS — B9689 Other specified bacterial agents as the cause of diseases classified elsewhere: Secondary | ICD-10-CM | POA: Diagnosis not present

## 2020-04-10 DIAGNOSIS — L89152 Pressure ulcer of sacral region, stage 2: Secondary | ICD-10-CM | POA: Diagnosis not present

## 2020-04-10 DIAGNOSIS — E785 Hyperlipidemia, unspecified: Secondary | ICD-10-CM | POA: Diagnosis not present

## 2020-04-10 DIAGNOSIS — E878 Other disorders of electrolyte and fluid balance, not elsewhere classified: Secondary | ICD-10-CM | POA: Diagnosis not present

## 2020-04-10 DIAGNOSIS — D500 Iron deficiency anemia secondary to blood loss (chronic): Secondary | ICD-10-CM | POA: Diagnosis not present

## 2020-04-10 DIAGNOSIS — T8149XD Infection following a procedure, other surgical site, subsequent encounter: Secondary | ICD-10-CM | POA: Diagnosis not present

## 2020-04-10 DIAGNOSIS — R159 Full incontinence of feces: Secondary | ICD-10-CM | POA: Diagnosis not present

## 2020-04-10 DIAGNOSIS — R627 Adult failure to thrive: Secondary | ICD-10-CM | POA: Diagnosis not present

## 2020-04-10 DIAGNOSIS — Z6827 Body mass index (BMI) 27.0-27.9, adult: Secondary | ICD-10-CM | POA: Diagnosis not present

## 2020-04-10 DIAGNOSIS — E43 Unspecified severe protein-calorie malnutrition: Secondary | ICD-10-CM | POA: Diagnosis not present

## 2020-04-10 DIAGNOSIS — I25709 Atherosclerosis of coronary artery bypass graft(s), unspecified, with unspecified angina pectoris: Secondary | ICD-10-CM | POA: Diagnosis not present

## 2020-04-10 DIAGNOSIS — J302 Other seasonal allergic rhinitis: Secondary | ICD-10-CM | POA: Diagnosis not present

## 2020-04-10 DIAGNOSIS — E041 Nontoxic single thyroid nodule: Secondary | ICD-10-CM | POA: Diagnosis not present

## 2020-04-10 DIAGNOSIS — I10 Essential (primary) hypertension: Secondary | ICD-10-CM | POA: Diagnosis not present

## 2020-04-10 DIAGNOSIS — M60051 Infective myositis, right thigh: Secondary | ICD-10-CM | POA: Diagnosis not present

## 2020-04-10 DIAGNOSIS — R32 Unspecified urinary incontinence: Secondary | ICD-10-CM | POA: Diagnosis not present

## 2020-04-10 DIAGNOSIS — I69318 Other symptoms and signs involving cognitive functions following cerebral infarction: Secondary | ICD-10-CM | POA: Diagnosis not present

## 2020-04-11 DIAGNOSIS — T8149XD Infection following a procedure, other surgical site, subsequent encounter: Secondary | ICD-10-CM | POA: Diagnosis not present

## 2020-04-11 DIAGNOSIS — E43 Unspecified severe protein-calorie malnutrition: Secondary | ICD-10-CM | POA: Diagnosis not present

## 2020-04-11 DIAGNOSIS — I25709 Atherosclerosis of coronary artery bypass graft(s), unspecified, with unspecified angina pectoris: Secondary | ICD-10-CM | POA: Diagnosis not present

## 2020-04-11 DIAGNOSIS — B9689 Other specified bacterial agents as the cause of diseases classified elsewhere: Secondary | ICD-10-CM | POA: Diagnosis not present

## 2020-04-11 DIAGNOSIS — M60051 Infective myositis, right thigh: Secondary | ICD-10-CM | POA: Diagnosis not present

## 2020-04-11 DIAGNOSIS — I69318 Other symptoms and signs involving cognitive functions following cerebral infarction: Secondary | ICD-10-CM | POA: Diagnosis not present

## 2020-04-14 DIAGNOSIS — B9689 Other specified bacterial agents as the cause of diseases classified elsewhere: Secondary | ICD-10-CM | POA: Diagnosis not present

## 2020-04-14 DIAGNOSIS — E43 Unspecified severe protein-calorie malnutrition: Secondary | ICD-10-CM | POA: Diagnosis not present

## 2020-04-14 DIAGNOSIS — I69318 Other symptoms and signs involving cognitive functions following cerebral infarction: Secondary | ICD-10-CM | POA: Diagnosis not present

## 2020-04-14 DIAGNOSIS — I25709 Atherosclerosis of coronary artery bypass graft(s), unspecified, with unspecified angina pectoris: Secondary | ICD-10-CM | POA: Diagnosis not present

## 2020-04-14 DIAGNOSIS — T8149XD Infection following a procedure, other surgical site, subsequent encounter: Secondary | ICD-10-CM | POA: Diagnosis not present

## 2020-04-14 DIAGNOSIS — M60051 Infective myositis, right thigh: Secondary | ICD-10-CM | POA: Diagnosis not present

## 2020-04-15 DIAGNOSIS — T8149XD Infection following a procedure, other surgical site, subsequent encounter: Secondary | ICD-10-CM | POA: Diagnosis not present

## 2020-04-15 DIAGNOSIS — I69318 Other symptoms and signs involving cognitive functions following cerebral infarction: Secondary | ICD-10-CM | POA: Diagnosis not present

## 2020-04-15 DIAGNOSIS — E43 Unspecified severe protein-calorie malnutrition: Secondary | ICD-10-CM | POA: Diagnosis not present

## 2020-04-15 DIAGNOSIS — B9689 Other specified bacterial agents as the cause of diseases classified elsewhere: Secondary | ICD-10-CM | POA: Diagnosis not present

## 2020-04-15 DIAGNOSIS — M60051 Infective myositis, right thigh: Secondary | ICD-10-CM | POA: Diagnosis not present

## 2020-04-15 DIAGNOSIS — I25709 Atherosclerosis of coronary artery bypass graft(s), unspecified, with unspecified angina pectoris: Secondary | ICD-10-CM | POA: Diagnosis not present

## 2020-04-16 DIAGNOSIS — T8149XD Infection following a procedure, other surgical site, subsequent encounter: Secondary | ICD-10-CM | POA: Diagnosis not present

## 2020-04-16 DIAGNOSIS — I25709 Atherosclerosis of coronary artery bypass graft(s), unspecified, with unspecified angina pectoris: Secondary | ICD-10-CM | POA: Diagnosis not present

## 2020-04-16 DIAGNOSIS — M60051 Infective myositis, right thigh: Secondary | ICD-10-CM | POA: Diagnosis not present

## 2020-04-16 DIAGNOSIS — I69318 Other symptoms and signs involving cognitive functions following cerebral infarction: Secondary | ICD-10-CM | POA: Diagnosis not present

## 2020-04-16 DIAGNOSIS — B9689 Other specified bacterial agents as the cause of diseases classified elsewhere: Secondary | ICD-10-CM | POA: Diagnosis not present

## 2020-04-16 DIAGNOSIS — E43 Unspecified severe protein-calorie malnutrition: Secondary | ICD-10-CM | POA: Diagnosis not present

## 2020-04-17 DIAGNOSIS — I25709 Atherosclerosis of coronary artery bypass graft(s), unspecified, with unspecified angina pectoris: Secondary | ICD-10-CM | POA: Diagnosis not present

## 2020-04-17 DIAGNOSIS — E43 Unspecified severe protein-calorie malnutrition: Secondary | ICD-10-CM | POA: Diagnosis not present

## 2020-04-17 DIAGNOSIS — M60051 Infective myositis, right thigh: Secondary | ICD-10-CM | POA: Diagnosis not present

## 2020-04-17 DIAGNOSIS — B9689 Other specified bacterial agents as the cause of diseases classified elsewhere: Secondary | ICD-10-CM | POA: Diagnosis not present

## 2020-04-17 DIAGNOSIS — T8149XD Infection following a procedure, other surgical site, subsequent encounter: Secondary | ICD-10-CM | POA: Diagnosis not present

## 2020-04-17 DIAGNOSIS — I69318 Other symptoms and signs involving cognitive functions following cerebral infarction: Secondary | ICD-10-CM | POA: Diagnosis not present

## 2020-04-18 DIAGNOSIS — E43 Unspecified severe protein-calorie malnutrition: Secondary | ICD-10-CM | POA: Diagnosis not present

## 2020-04-18 DIAGNOSIS — I69318 Other symptoms and signs involving cognitive functions following cerebral infarction: Secondary | ICD-10-CM | POA: Diagnosis not present

## 2020-04-18 DIAGNOSIS — B9689 Other specified bacterial agents as the cause of diseases classified elsewhere: Secondary | ICD-10-CM | POA: Diagnosis not present

## 2020-04-18 DIAGNOSIS — I25709 Atherosclerosis of coronary artery bypass graft(s), unspecified, with unspecified angina pectoris: Secondary | ICD-10-CM | POA: Diagnosis not present

## 2020-04-18 DIAGNOSIS — M60051 Infective myositis, right thigh: Secondary | ICD-10-CM | POA: Diagnosis not present

## 2020-04-18 DIAGNOSIS — T8149XD Infection following a procedure, other surgical site, subsequent encounter: Secondary | ICD-10-CM | POA: Diagnosis not present

## 2020-04-21 DIAGNOSIS — I69318 Other symptoms and signs involving cognitive functions following cerebral infarction: Secondary | ICD-10-CM | POA: Diagnosis not present

## 2020-04-21 DIAGNOSIS — E43 Unspecified severe protein-calorie malnutrition: Secondary | ICD-10-CM | POA: Diagnosis not present

## 2020-04-21 DIAGNOSIS — I25709 Atherosclerosis of coronary artery bypass graft(s), unspecified, with unspecified angina pectoris: Secondary | ICD-10-CM | POA: Diagnosis not present

## 2020-04-21 DIAGNOSIS — B9689 Other specified bacterial agents as the cause of diseases classified elsewhere: Secondary | ICD-10-CM | POA: Diagnosis not present

## 2020-04-21 DIAGNOSIS — T8149XD Infection following a procedure, other surgical site, subsequent encounter: Secondary | ICD-10-CM | POA: Diagnosis not present

## 2020-04-21 DIAGNOSIS — M60051 Infective myositis, right thigh: Secondary | ICD-10-CM | POA: Diagnosis not present

## 2020-04-22 DIAGNOSIS — I69318 Other symptoms and signs involving cognitive functions following cerebral infarction: Secondary | ICD-10-CM | POA: Diagnosis not present

## 2020-04-22 DIAGNOSIS — B9689 Other specified bacterial agents as the cause of diseases classified elsewhere: Secondary | ICD-10-CM | POA: Diagnosis not present

## 2020-04-22 DIAGNOSIS — I25709 Atherosclerosis of coronary artery bypass graft(s), unspecified, with unspecified angina pectoris: Secondary | ICD-10-CM | POA: Diagnosis not present

## 2020-04-22 DIAGNOSIS — M60051 Infective myositis, right thigh: Secondary | ICD-10-CM | POA: Diagnosis not present

## 2020-04-22 DIAGNOSIS — E43 Unspecified severe protein-calorie malnutrition: Secondary | ICD-10-CM | POA: Diagnosis not present

## 2020-04-22 DIAGNOSIS — T8149XD Infection following a procedure, other surgical site, subsequent encounter: Secondary | ICD-10-CM | POA: Diagnosis not present

## 2020-04-23 DIAGNOSIS — M60051 Infective myositis, right thigh: Secondary | ICD-10-CM | POA: Diagnosis not present

## 2020-04-23 DIAGNOSIS — B9689 Other specified bacterial agents as the cause of diseases classified elsewhere: Secondary | ICD-10-CM | POA: Diagnosis not present

## 2020-04-23 DIAGNOSIS — I69318 Other symptoms and signs involving cognitive functions following cerebral infarction: Secondary | ICD-10-CM | POA: Diagnosis not present

## 2020-04-23 DIAGNOSIS — I25709 Atherosclerosis of coronary artery bypass graft(s), unspecified, with unspecified angina pectoris: Secondary | ICD-10-CM | POA: Diagnosis not present

## 2020-04-23 DIAGNOSIS — E43 Unspecified severe protein-calorie malnutrition: Secondary | ICD-10-CM | POA: Diagnosis not present

## 2020-04-23 DIAGNOSIS — T8149XD Infection following a procedure, other surgical site, subsequent encounter: Secondary | ICD-10-CM | POA: Diagnosis not present

## 2020-04-24 DIAGNOSIS — M60051 Infective myositis, right thigh: Secondary | ICD-10-CM | POA: Diagnosis not present

## 2020-04-24 DIAGNOSIS — B9689 Other specified bacterial agents as the cause of diseases classified elsewhere: Secondary | ICD-10-CM | POA: Diagnosis not present

## 2020-04-24 DIAGNOSIS — I69318 Other symptoms and signs involving cognitive functions following cerebral infarction: Secondary | ICD-10-CM | POA: Diagnosis not present

## 2020-04-24 DIAGNOSIS — I25709 Atherosclerosis of coronary artery bypass graft(s), unspecified, with unspecified angina pectoris: Secondary | ICD-10-CM | POA: Diagnosis not present

## 2020-04-24 DIAGNOSIS — E43 Unspecified severe protein-calorie malnutrition: Secondary | ICD-10-CM | POA: Diagnosis not present

## 2020-04-24 DIAGNOSIS — T8149XD Infection following a procedure, other surgical site, subsequent encounter: Secondary | ICD-10-CM | POA: Diagnosis not present

## 2020-04-25 DIAGNOSIS — T8149XD Infection following a procedure, other surgical site, subsequent encounter: Secondary | ICD-10-CM | POA: Diagnosis not present

## 2020-04-25 DIAGNOSIS — E43 Unspecified severe protein-calorie malnutrition: Secondary | ICD-10-CM | POA: Diagnosis not present

## 2020-04-25 DIAGNOSIS — M60051 Infective myositis, right thigh: Secondary | ICD-10-CM | POA: Diagnosis not present

## 2020-04-25 DIAGNOSIS — I69318 Other symptoms and signs involving cognitive functions following cerebral infarction: Secondary | ICD-10-CM | POA: Diagnosis not present

## 2020-04-25 DIAGNOSIS — I25709 Atherosclerosis of coronary artery bypass graft(s), unspecified, with unspecified angina pectoris: Secondary | ICD-10-CM | POA: Diagnosis not present

## 2020-04-25 DIAGNOSIS — B9689 Other specified bacterial agents as the cause of diseases classified elsewhere: Secondary | ICD-10-CM | POA: Diagnosis not present

## 2020-04-28 DIAGNOSIS — B9689 Other specified bacterial agents as the cause of diseases classified elsewhere: Secondary | ICD-10-CM | POA: Diagnosis not present

## 2020-04-28 DIAGNOSIS — I69318 Other symptoms and signs involving cognitive functions following cerebral infarction: Secondary | ICD-10-CM | POA: Diagnosis not present

## 2020-04-28 DIAGNOSIS — E43 Unspecified severe protein-calorie malnutrition: Secondary | ICD-10-CM | POA: Diagnosis not present

## 2020-04-28 DIAGNOSIS — T8149XD Infection following a procedure, other surgical site, subsequent encounter: Secondary | ICD-10-CM | POA: Diagnosis not present

## 2020-04-28 DIAGNOSIS — M60051 Infective myositis, right thigh: Secondary | ICD-10-CM | POA: Diagnosis not present

## 2020-04-28 DIAGNOSIS — I25709 Atherosclerosis of coronary artery bypass graft(s), unspecified, with unspecified angina pectoris: Secondary | ICD-10-CM | POA: Diagnosis not present

## 2020-04-29 DIAGNOSIS — M60051 Infective myositis, right thigh: Secondary | ICD-10-CM | POA: Diagnosis not present

## 2020-04-29 DIAGNOSIS — I69318 Other symptoms and signs involving cognitive functions following cerebral infarction: Secondary | ICD-10-CM | POA: Diagnosis not present

## 2020-04-29 DIAGNOSIS — T8149XD Infection following a procedure, other surgical site, subsequent encounter: Secondary | ICD-10-CM | POA: Diagnosis not present

## 2020-04-29 DIAGNOSIS — I25709 Atherosclerosis of coronary artery bypass graft(s), unspecified, with unspecified angina pectoris: Secondary | ICD-10-CM | POA: Diagnosis not present

## 2020-04-29 DIAGNOSIS — B9689 Other specified bacterial agents as the cause of diseases classified elsewhere: Secondary | ICD-10-CM | POA: Diagnosis not present

## 2020-04-29 DIAGNOSIS — E43 Unspecified severe protein-calorie malnutrition: Secondary | ICD-10-CM | POA: Diagnosis not present

## 2020-04-30 DIAGNOSIS — I25709 Atherosclerosis of coronary artery bypass graft(s), unspecified, with unspecified angina pectoris: Secondary | ICD-10-CM | POA: Diagnosis not present

## 2020-04-30 DIAGNOSIS — M60051 Infective myositis, right thigh: Secondary | ICD-10-CM | POA: Diagnosis not present

## 2020-04-30 DIAGNOSIS — I69318 Other symptoms and signs involving cognitive functions following cerebral infarction: Secondary | ICD-10-CM | POA: Diagnosis not present

## 2020-04-30 DIAGNOSIS — T8149XD Infection following a procedure, other surgical site, subsequent encounter: Secondary | ICD-10-CM | POA: Diagnosis not present

## 2020-04-30 DIAGNOSIS — E43 Unspecified severe protein-calorie malnutrition: Secondary | ICD-10-CM | POA: Diagnosis not present

## 2020-04-30 DIAGNOSIS — B9689 Other specified bacterial agents as the cause of diseases classified elsewhere: Secondary | ICD-10-CM | POA: Diagnosis not present

## 2020-05-01 DIAGNOSIS — B9689 Other specified bacterial agents as the cause of diseases classified elsewhere: Secondary | ICD-10-CM | POA: Diagnosis not present

## 2020-05-01 DIAGNOSIS — I25709 Atherosclerosis of coronary artery bypass graft(s), unspecified, with unspecified angina pectoris: Secondary | ICD-10-CM | POA: Diagnosis not present

## 2020-05-01 DIAGNOSIS — T8149XD Infection following a procedure, other surgical site, subsequent encounter: Secondary | ICD-10-CM | POA: Diagnosis not present

## 2020-05-01 DIAGNOSIS — I69318 Other symptoms and signs involving cognitive functions following cerebral infarction: Secondary | ICD-10-CM | POA: Diagnosis not present

## 2020-05-01 DIAGNOSIS — M60051 Infective myositis, right thigh: Secondary | ICD-10-CM | POA: Diagnosis not present

## 2020-05-01 DIAGNOSIS — E43 Unspecified severe protein-calorie malnutrition: Secondary | ICD-10-CM | POA: Diagnosis not present

## 2020-05-02 DIAGNOSIS — I25709 Atherosclerosis of coronary artery bypass graft(s), unspecified, with unspecified angina pectoris: Secondary | ICD-10-CM | POA: Diagnosis not present

## 2020-05-02 DIAGNOSIS — E43 Unspecified severe protein-calorie malnutrition: Secondary | ICD-10-CM | POA: Diagnosis not present

## 2020-05-02 DIAGNOSIS — I69318 Other symptoms and signs involving cognitive functions following cerebral infarction: Secondary | ICD-10-CM | POA: Diagnosis not present

## 2020-05-02 DIAGNOSIS — B9689 Other specified bacterial agents as the cause of diseases classified elsewhere: Secondary | ICD-10-CM | POA: Diagnosis not present

## 2020-05-02 DIAGNOSIS — M60051 Infective myositis, right thigh: Secondary | ICD-10-CM | POA: Diagnosis not present

## 2020-05-02 DIAGNOSIS — T8149XD Infection following a procedure, other surgical site, subsequent encounter: Secondary | ICD-10-CM | POA: Diagnosis not present

## 2020-05-04 DIAGNOSIS — I69318 Other symptoms and signs involving cognitive functions following cerebral infarction: Secondary | ICD-10-CM | POA: Diagnosis not present

## 2020-05-04 DIAGNOSIS — I25709 Atherosclerosis of coronary artery bypass graft(s), unspecified, with unspecified angina pectoris: Secondary | ICD-10-CM | POA: Diagnosis not present

## 2020-05-04 DIAGNOSIS — B9689 Other specified bacterial agents as the cause of diseases classified elsewhere: Secondary | ICD-10-CM | POA: Diagnosis not present

## 2020-05-04 DIAGNOSIS — T8149XD Infection following a procedure, other surgical site, subsequent encounter: Secondary | ICD-10-CM | POA: Diagnosis not present

## 2020-05-04 DIAGNOSIS — M60051 Infective myositis, right thigh: Secondary | ICD-10-CM | POA: Diagnosis not present

## 2020-05-04 DIAGNOSIS — E43 Unspecified severe protein-calorie malnutrition: Secondary | ICD-10-CM | POA: Diagnosis not present

## 2020-05-05 DIAGNOSIS — T8149XD Infection following a procedure, other surgical site, subsequent encounter: Secondary | ICD-10-CM | POA: Diagnosis not present

## 2020-05-05 DIAGNOSIS — I25709 Atherosclerosis of coronary artery bypass graft(s), unspecified, with unspecified angina pectoris: Secondary | ICD-10-CM | POA: Diagnosis not present

## 2020-05-05 DIAGNOSIS — M60051 Infective myositis, right thigh: Secondary | ICD-10-CM | POA: Diagnosis not present

## 2020-05-05 DIAGNOSIS — E43 Unspecified severe protein-calorie malnutrition: Secondary | ICD-10-CM | POA: Diagnosis not present

## 2020-05-05 DIAGNOSIS — B9689 Other specified bacterial agents as the cause of diseases classified elsewhere: Secondary | ICD-10-CM | POA: Diagnosis not present

## 2020-05-05 DIAGNOSIS — I69318 Other symptoms and signs involving cognitive functions following cerebral infarction: Secondary | ICD-10-CM | POA: Diagnosis not present

## 2020-05-06 DIAGNOSIS — T8149XD Infection following a procedure, other surgical site, subsequent encounter: Secondary | ICD-10-CM | POA: Diagnosis not present

## 2020-05-06 DIAGNOSIS — B9689 Other specified bacterial agents as the cause of diseases classified elsewhere: Secondary | ICD-10-CM | POA: Diagnosis not present

## 2020-05-06 DIAGNOSIS — I69318 Other symptoms and signs involving cognitive functions following cerebral infarction: Secondary | ICD-10-CM | POA: Diagnosis not present

## 2020-05-06 DIAGNOSIS — I25709 Atherosclerosis of coronary artery bypass graft(s), unspecified, with unspecified angina pectoris: Secondary | ICD-10-CM | POA: Diagnosis not present

## 2020-05-06 DIAGNOSIS — E43 Unspecified severe protein-calorie malnutrition: Secondary | ICD-10-CM | POA: Diagnosis not present

## 2020-05-06 DIAGNOSIS — M60051 Infective myositis, right thigh: Secondary | ICD-10-CM | POA: Diagnosis not present

## 2020-05-07 ENCOUNTER — Ambulatory Visit: Payer: Medicare Other | Admitting: Podiatry

## 2020-05-07 DIAGNOSIS — I69318 Other symptoms and signs involving cognitive functions following cerebral infarction: Secondary | ICD-10-CM | POA: Diagnosis not present

## 2020-05-07 DIAGNOSIS — E43 Unspecified severe protein-calorie malnutrition: Secondary | ICD-10-CM | POA: Diagnosis not present

## 2020-05-07 DIAGNOSIS — I25709 Atherosclerosis of coronary artery bypass graft(s), unspecified, with unspecified angina pectoris: Secondary | ICD-10-CM | POA: Diagnosis not present

## 2020-05-07 DIAGNOSIS — T8149XD Infection following a procedure, other surgical site, subsequent encounter: Secondary | ICD-10-CM | POA: Diagnosis not present

## 2020-05-07 DIAGNOSIS — B9689 Other specified bacterial agents as the cause of diseases classified elsewhere: Secondary | ICD-10-CM | POA: Diagnosis not present

## 2020-05-07 DIAGNOSIS — M60051 Infective myositis, right thigh: Secondary | ICD-10-CM | POA: Diagnosis not present

## 2020-05-08 DIAGNOSIS — I25709 Atherosclerosis of coronary artery bypass graft(s), unspecified, with unspecified angina pectoris: Secondary | ICD-10-CM | POA: Diagnosis not present

## 2020-05-08 DIAGNOSIS — T8149XD Infection following a procedure, other surgical site, subsequent encounter: Secondary | ICD-10-CM | POA: Diagnosis not present

## 2020-05-08 DIAGNOSIS — B9689 Other specified bacterial agents as the cause of diseases classified elsewhere: Secondary | ICD-10-CM | POA: Diagnosis not present

## 2020-05-08 DIAGNOSIS — E43 Unspecified severe protein-calorie malnutrition: Secondary | ICD-10-CM | POA: Diagnosis not present

## 2020-05-08 DIAGNOSIS — I69318 Other symptoms and signs involving cognitive functions following cerebral infarction: Secondary | ICD-10-CM | POA: Diagnosis not present

## 2020-05-08 DIAGNOSIS — M60051 Infective myositis, right thigh: Secondary | ICD-10-CM | POA: Diagnosis not present

## 2020-05-09 DIAGNOSIS — T8149XD Infection following a procedure, other surgical site, subsequent encounter: Secondary | ICD-10-CM | POA: Diagnosis not present

## 2020-05-09 DIAGNOSIS — B9689 Other specified bacterial agents as the cause of diseases classified elsewhere: Secondary | ICD-10-CM | POA: Diagnosis not present

## 2020-05-09 DIAGNOSIS — E43 Unspecified severe protein-calorie malnutrition: Secondary | ICD-10-CM | POA: Diagnosis not present

## 2020-05-09 DIAGNOSIS — I25709 Atherosclerosis of coronary artery bypass graft(s), unspecified, with unspecified angina pectoris: Secondary | ICD-10-CM | POA: Diagnosis not present

## 2020-05-09 DIAGNOSIS — M60051 Infective myositis, right thigh: Secondary | ICD-10-CM | POA: Diagnosis not present

## 2020-05-09 DIAGNOSIS — I69318 Other symptoms and signs involving cognitive functions following cerebral infarction: Secondary | ICD-10-CM | POA: Diagnosis not present

## 2020-05-11 DIAGNOSIS — T8149XD Infection following a procedure, other surgical site, subsequent encounter: Secondary | ICD-10-CM | POA: Diagnosis not present

## 2020-05-11 DIAGNOSIS — I69318 Other symptoms and signs involving cognitive functions following cerebral infarction: Secondary | ICD-10-CM | POA: Diagnosis not present

## 2020-05-11 DIAGNOSIS — D500 Iron deficiency anemia secondary to blood loss (chronic): Secondary | ICD-10-CM | POA: Diagnosis not present

## 2020-05-11 DIAGNOSIS — L89152 Pressure ulcer of sacral region, stage 2: Secondary | ICD-10-CM | POA: Diagnosis not present

## 2020-05-11 DIAGNOSIS — E878 Other disorders of electrolyte and fluid balance, not elsewhere classified: Secondary | ICD-10-CM | POA: Diagnosis not present

## 2020-05-11 DIAGNOSIS — J302 Other seasonal allergic rhinitis: Secondary | ICD-10-CM | POA: Diagnosis not present

## 2020-05-11 DIAGNOSIS — E43 Unspecified severe protein-calorie malnutrition: Secondary | ICD-10-CM | POA: Diagnosis not present

## 2020-05-11 DIAGNOSIS — E785 Hyperlipidemia, unspecified: Secondary | ICD-10-CM | POA: Diagnosis not present

## 2020-05-11 DIAGNOSIS — M60051 Infective myositis, right thigh: Secondary | ICD-10-CM | POA: Diagnosis not present

## 2020-05-11 DIAGNOSIS — I10 Essential (primary) hypertension: Secondary | ICD-10-CM | POA: Diagnosis not present

## 2020-05-11 DIAGNOSIS — Z6827 Body mass index (BMI) 27.0-27.9, adult: Secondary | ICD-10-CM | POA: Diagnosis not present

## 2020-05-11 DIAGNOSIS — E119 Type 2 diabetes mellitus without complications: Secondary | ICD-10-CM | POA: Diagnosis not present

## 2020-05-11 DIAGNOSIS — B9689 Other specified bacterial agents as the cause of diseases classified elsewhere: Secondary | ICD-10-CM | POA: Diagnosis not present

## 2020-05-11 DIAGNOSIS — I25709 Atherosclerosis of coronary artery bypass graft(s), unspecified, with unspecified angina pectoris: Secondary | ICD-10-CM | POA: Diagnosis not present

## 2020-05-11 DIAGNOSIS — R159 Full incontinence of feces: Secondary | ICD-10-CM | POA: Diagnosis not present

## 2020-05-11 DIAGNOSIS — E041 Nontoxic single thyroid nodule: Secondary | ICD-10-CM | POA: Diagnosis not present

## 2020-05-11 DIAGNOSIS — R32 Unspecified urinary incontinence: Secondary | ICD-10-CM | POA: Diagnosis not present

## 2020-05-11 DIAGNOSIS — R627 Adult failure to thrive: Secondary | ICD-10-CM | POA: Diagnosis not present

## 2020-05-12 DIAGNOSIS — B9689 Other specified bacterial agents as the cause of diseases classified elsewhere: Secondary | ICD-10-CM | POA: Diagnosis not present

## 2020-05-12 DIAGNOSIS — M60051 Infective myositis, right thigh: Secondary | ICD-10-CM | POA: Diagnosis not present

## 2020-05-12 DIAGNOSIS — E43 Unspecified severe protein-calorie malnutrition: Secondary | ICD-10-CM | POA: Diagnosis not present

## 2020-05-12 DIAGNOSIS — T8149XD Infection following a procedure, other surgical site, subsequent encounter: Secondary | ICD-10-CM | POA: Diagnosis not present

## 2020-05-12 DIAGNOSIS — I25709 Atherosclerosis of coronary artery bypass graft(s), unspecified, with unspecified angina pectoris: Secondary | ICD-10-CM | POA: Diagnosis not present

## 2020-05-12 DIAGNOSIS — I69318 Other symptoms and signs involving cognitive functions following cerebral infarction: Secondary | ICD-10-CM | POA: Diagnosis not present

## 2020-05-13 DIAGNOSIS — B9689 Other specified bacterial agents as the cause of diseases classified elsewhere: Secondary | ICD-10-CM | POA: Diagnosis not present

## 2020-05-13 DIAGNOSIS — T8149XD Infection following a procedure, other surgical site, subsequent encounter: Secondary | ICD-10-CM | POA: Diagnosis not present

## 2020-05-13 DIAGNOSIS — I25709 Atherosclerosis of coronary artery bypass graft(s), unspecified, with unspecified angina pectoris: Secondary | ICD-10-CM | POA: Diagnosis not present

## 2020-05-13 DIAGNOSIS — I69318 Other symptoms and signs involving cognitive functions following cerebral infarction: Secondary | ICD-10-CM | POA: Diagnosis not present

## 2020-05-13 DIAGNOSIS — E43 Unspecified severe protein-calorie malnutrition: Secondary | ICD-10-CM | POA: Diagnosis not present

## 2020-05-13 DIAGNOSIS — M60051 Infective myositis, right thigh: Secondary | ICD-10-CM | POA: Diagnosis not present

## 2020-05-14 ENCOUNTER — Telehealth: Payer: Self-pay

## 2020-05-14 DIAGNOSIS — I69318 Other symptoms and signs involving cognitive functions following cerebral infarction: Secondary | ICD-10-CM | POA: Diagnosis not present

## 2020-05-14 DIAGNOSIS — B9689 Other specified bacterial agents as the cause of diseases classified elsewhere: Secondary | ICD-10-CM | POA: Diagnosis not present

## 2020-05-14 DIAGNOSIS — T8149XD Infection following a procedure, other surgical site, subsequent encounter: Secondary | ICD-10-CM | POA: Diagnosis not present

## 2020-05-14 DIAGNOSIS — I25709 Atherosclerosis of coronary artery bypass graft(s), unspecified, with unspecified angina pectoris: Secondary | ICD-10-CM | POA: Diagnosis not present

## 2020-05-14 DIAGNOSIS — M60051 Infective myositis, right thigh: Secondary | ICD-10-CM | POA: Diagnosis not present

## 2020-05-14 DIAGNOSIS — E43 Unspecified severe protein-calorie malnutrition: Secondary | ICD-10-CM | POA: Diagnosis not present

## 2020-05-14 NOTE — Telephone Encounter (Signed)
A representative with [employer] contacted the office requesting a scheduled pickup of patient's wound vac.  Vac was discontinued in the hospital.  Left voicemail message to representative and advised that the schedule pick-up would take place through the hospital as the vac in not at the office.  Left number of office and hospital if any further questions.  Will also route message to case manager.

## 2020-05-15 DIAGNOSIS — I69318 Other symptoms and signs involving cognitive functions following cerebral infarction: Secondary | ICD-10-CM | POA: Diagnosis not present

## 2020-05-15 DIAGNOSIS — T8149XD Infection following a procedure, other surgical site, subsequent encounter: Secondary | ICD-10-CM | POA: Diagnosis not present

## 2020-05-15 DIAGNOSIS — M60051 Infective myositis, right thigh: Secondary | ICD-10-CM | POA: Diagnosis not present

## 2020-05-15 DIAGNOSIS — B9689 Other specified bacterial agents as the cause of diseases classified elsewhere: Secondary | ICD-10-CM | POA: Diagnosis not present

## 2020-05-15 DIAGNOSIS — E43 Unspecified severe protein-calorie malnutrition: Secondary | ICD-10-CM | POA: Diagnosis not present

## 2020-05-15 DIAGNOSIS — I25709 Atherosclerosis of coronary artery bypass graft(s), unspecified, with unspecified angina pectoris: Secondary | ICD-10-CM | POA: Diagnosis not present

## 2020-05-16 DIAGNOSIS — I25709 Atherosclerosis of coronary artery bypass graft(s), unspecified, with unspecified angina pectoris: Secondary | ICD-10-CM | POA: Diagnosis not present

## 2020-05-16 DIAGNOSIS — B9689 Other specified bacterial agents as the cause of diseases classified elsewhere: Secondary | ICD-10-CM | POA: Diagnosis not present

## 2020-05-16 DIAGNOSIS — E43 Unspecified severe protein-calorie malnutrition: Secondary | ICD-10-CM | POA: Diagnosis not present

## 2020-05-16 DIAGNOSIS — M60051 Infective myositis, right thigh: Secondary | ICD-10-CM | POA: Diagnosis not present

## 2020-05-16 DIAGNOSIS — I69318 Other symptoms and signs involving cognitive functions following cerebral infarction: Secondary | ICD-10-CM | POA: Diagnosis not present

## 2020-05-16 DIAGNOSIS — T8149XD Infection following a procedure, other surgical site, subsequent encounter: Secondary | ICD-10-CM | POA: Diagnosis not present

## 2020-05-19 DIAGNOSIS — T8149XD Infection following a procedure, other surgical site, subsequent encounter: Secondary | ICD-10-CM | POA: Diagnosis not present

## 2020-05-19 DIAGNOSIS — I69318 Other symptoms and signs involving cognitive functions following cerebral infarction: Secondary | ICD-10-CM | POA: Diagnosis not present

## 2020-05-19 DIAGNOSIS — B9689 Other specified bacterial agents as the cause of diseases classified elsewhere: Secondary | ICD-10-CM | POA: Diagnosis not present

## 2020-05-19 DIAGNOSIS — M60051 Infective myositis, right thigh: Secondary | ICD-10-CM | POA: Diagnosis not present

## 2020-05-19 DIAGNOSIS — E43 Unspecified severe protein-calorie malnutrition: Secondary | ICD-10-CM | POA: Diagnosis not present

## 2020-05-19 DIAGNOSIS — I25709 Atherosclerosis of coronary artery bypass graft(s), unspecified, with unspecified angina pectoris: Secondary | ICD-10-CM | POA: Diagnosis not present

## 2020-05-20 DIAGNOSIS — T8149XD Infection following a procedure, other surgical site, subsequent encounter: Secondary | ICD-10-CM | POA: Diagnosis not present

## 2020-05-20 DIAGNOSIS — I69318 Other symptoms and signs involving cognitive functions following cerebral infarction: Secondary | ICD-10-CM | POA: Diagnosis not present

## 2020-05-20 DIAGNOSIS — E43 Unspecified severe protein-calorie malnutrition: Secondary | ICD-10-CM | POA: Diagnosis not present

## 2020-05-20 DIAGNOSIS — I25709 Atherosclerosis of coronary artery bypass graft(s), unspecified, with unspecified angina pectoris: Secondary | ICD-10-CM | POA: Diagnosis not present

## 2020-05-20 DIAGNOSIS — M60051 Infective myositis, right thigh: Secondary | ICD-10-CM | POA: Diagnosis not present

## 2020-05-20 DIAGNOSIS — B9689 Other specified bacterial agents as the cause of diseases classified elsewhere: Secondary | ICD-10-CM | POA: Diagnosis not present

## 2020-05-21 DIAGNOSIS — I25709 Atherosclerosis of coronary artery bypass graft(s), unspecified, with unspecified angina pectoris: Secondary | ICD-10-CM | POA: Diagnosis not present

## 2020-05-21 DIAGNOSIS — I69318 Other symptoms and signs involving cognitive functions following cerebral infarction: Secondary | ICD-10-CM | POA: Diagnosis not present

## 2020-05-21 DIAGNOSIS — E43 Unspecified severe protein-calorie malnutrition: Secondary | ICD-10-CM | POA: Diagnosis not present

## 2020-05-21 DIAGNOSIS — B9689 Other specified bacterial agents as the cause of diseases classified elsewhere: Secondary | ICD-10-CM | POA: Diagnosis not present

## 2020-05-21 DIAGNOSIS — M60051 Infective myositis, right thigh: Secondary | ICD-10-CM | POA: Diagnosis not present

## 2020-05-21 DIAGNOSIS — T8149XD Infection following a procedure, other surgical site, subsequent encounter: Secondary | ICD-10-CM | POA: Diagnosis not present

## 2020-05-22 DIAGNOSIS — B9689 Other specified bacterial agents as the cause of diseases classified elsewhere: Secondary | ICD-10-CM | POA: Diagnosis not present

## 2020-05-22 DIAGNOSIS — I69318 Other symptoms and signs involving cognitive functions following cerebral infarction: Secondary | ICD-10-CM | POA: Diagnosis not present

## 2020-05-22 DIAGNOSIS — T8149XD Infection following a procedure, other surgical site, subsequent encounter: Secondary | ICD-10-CM | POA: Diagnosis not present

## 2020-05-22 DIAGNOSIS — E43 Unspecified severe protein-calorie malnutrition: Secondary | ICD-10-CM | POA: Diagnosis not present

## 2020-05-22 DIAGNOSIS — M60051 Infective myositis, right thigh: Secondary | ICD-10-CM | POA: Diagnosis not present

## 2020-05-22 DIAGNOSIS — I25709 Atherosclerosis of coronary artery bypass graft(s), unspecified, with unspecified angina pectoris: Secondary | ICD-10-CM | POA: Diagnosis not present

## 2020-05-23 DIAGNOSIS — I25709 Atherosclerosis of coronary artery bypass graft(s), unspecified, with unspecified angina pectoris: Secondary | ICD-10-CM | POA: Diagnosis not present

## 2020-05-23 DIAGNOSIS — B9689 Other specified bacterial agents as the cause of diseases classified elsewhere: Secondary | ICD-10-CM | POA: Diagnosis not present

## 2020-05-23 DIAGNOSIS — M60051 Infective myositis, right thigh: Secondary | ICD-10-CM | POA: Diagnosis not present

## 2020-05-23 DIAGNOSIS — I69318 Other symptoms and signs involving cognitive functions following cerebral infarction: Secondary | ICD-10-CM | POA: Diagnosis not present

## 2020-05-23 DIAGNOSIS — T8149XD Infection following a procedure, other surgical site, subsequent encounter: Secondary | ICD-10-CM | POA: Diagnosis not present

## 2020-05-23 DIAGNOSIS — E43 Unspecified severe protein-calorie malnutrition: Secondary | ICD-10-CM | POA: Diagnosis not present

## 2020-05-26 DIAGNOSIS — I69318 Other symptoms and signs involving cognitive functions following cerebral infarction: Secondary | ICD-10-CM | POA: Diagnosis not present

## 2020-05-26 DIAGNOSIS — E43 Unspecified severe protein-calorie malnutrition: Secondary | ICD-10-CM | POA: Diagnosis not present

## 2020-05-26 DIAGNOSIS — B9689 Other specified bacterial agents as the cause of diseases classified elsewhere: Secondary | ICD-10-CM | POA: Diagnosis not present

## 2020-05-26 DIAGNOSIS — T8149XD Infection following a procedure, other surgical site, subsequent encounter: Secondary | ICD-10-CM | POA: Diagnosis not present

## 2020-05-26 DIAGNOSIS — M60051 Infective myositis, right thigh: Secondary | ICD-10-CM | POA: Diagnosis not present

## 2020-05-26 DIAGNOSIS — I25709 Atherosclerosis of coronary artery bypass graft(s), unspecified, with unspecified angina pectoris: Secondary | ICD-10-CM | POA: Diagnosis not present

## 2020-05-27 DIAGNOSIS — I69318 Other symptoms and signs involving cognitive functions following cerebral infarction: Secondary | ICD-10-CM | POA: Diagnosis not present

## 2020-05-27 DIAGNOSIS — B9689 Other specified bacterial agents as the cause of diseases classified elsewhere: Secondary | ICD-10-CM | POA: Diagnosis not present

## 2020-05-27 DIAGNOSIS — E43 Unspecified severe protein-calorie malnutrition: Secondary | ICD-10-CM | POA: Diagnosis not present

## 2020-05-27 DIAGNOSIS — T8149XD Infection following a procedure, other surgical site, subsequent encounter: Secondary | ICD-10-CM | POA: Diagnosis not present

## 2020-05-27 DIAGNOSIS — I25709 Atherosclerosis of coronary artery bypass graft(s), unspecified, with unspecified angina pectoris: Secondary | ICD-10-CM | POA: Diagnosis not present

## 2020-05-27 DIAGNOSIS — M60051 Infective myositis, right thigh: Secondary | ICD-10-CM | POA: Diagnosis not present

## 2020-05-28 DIAGNOSIS — I25709 Atherosclerosis of coronary artery bypass graft(s), unspecified, with unspecified angina pectoris: Secondary | ICD-10-CM | POA: Diagnosis not present

## 2020-05-28 DIAGNOSIS — I69318 Other symptoms and signs involving cognitive functions following cerebral infarction: Secondary | ICD-10-CM | POA: Diagnosis not present

## 2020-05-28 DIAGNOSIS — T8149XD Infection following a procedure, other surgical site, subsequent encounter: Secondary | ICD-10-CM | POA: Diagnosis not present

## 2020-05-28 DIAGNOSIS — M60051 Infective myositis, right thigh: Secondary | ICD-10-CM | POA: Diagnosis not present

## 2020-05-28 DIAGNOSIS — B9689 Other specified bacterial agents as the cause of diseases classified elsewhere: Secondary | ICD-10-CM | POA: Diagnosis not present

## 2020-05-28 DIAGNOSIS — E43 Unspecified severe protein-calorie malnutrition: Secondary | ICD-10-CM | POA: Diagnosis not present

## 2020-05-29 DIAGNOSIS — I69318 Other symptoms and signs involving cognitive functions following cerebral infarction: Secondary | ICD-10-CM | POA: Diagnosis not present

## 2020-05-29 DIAGNOSIS — B9689 Other specified bacterial agents as the cause of diseases classified elsewhere: Secondary | ICD-10-CM | POA: Diagnosis not present

## 2020-05-29 DIAGNOSIS — M60051 Infective myositis, right thigh: Secondary | ICD-10-CM | POA: Diagnosis not present

## 2020-05-29 DIAGNOSIS — E43 Unspecified severe protein-calorie malnutrition: Secondary | ICD-10-CM | POA: Diagnosis not present

## 2020-05-29 DIAGNOSIS — T8149XD Infection following a procedure, other surgical site, subsequent encounter: Secondary | ICD-10-CM | POA: Diagnosis not present

## 2020-05-29 DIAGNOSIS — I25709 Atherosclerosis of coronary artery bypass graft(s), unspecified, with unspecified angina pectoris: Secondary | ICD-10-CM | POA: Diagnosis not present

## 2020-05-30 DIAGNOSIS — E43 Unspecified severe protein-calorie malnutrition: Secondary | ICD-10-CM | POA: Diagnosis not present

## 2020-05-30 DIAGNOSIS — I69318 Other symptoms and signs involving cognitive functions following cerebral infarction: Secondary | ICD-10-CM | POA: Diagnosis not present

## 2020-05-30 DIAGNOSIS — I25709 Atherosclerosis of coronary artery bypass graft(s), unspecified, with unspecified angina pectoris: Secondary | ICD-10-CM | POA: Diagnosis not present

## 2020-05-30 DIAGNOSIS — T8149XD Infection following a procedure, other surgical site, subsequent encounter: Secondary | ICD-10-CM | POA: Diagnosis not present

## 2020-05-30 DIAGNOSIS — B9689 Other specified bacterial agents as the cause of diseases classified elsewhere: Secondary | ICD-10-CM | POA: Diagnosis not present

## 2020-05-30 DIAGNOSIS — M60051 Infective myositis, right thigh: Secondary | ICD-10-CM | POA: Diagnosis not present

## 2020-06-02 DIAGNOSIS — M60051 Infective myositis, right thigh: Secondary | ICD-10-CM | POA: Diagnosis not present

## 2020-06-02 DIAGNOSIS — E43 Unspecified severe protein-calorie malnutrition: Secondary | ICD-10-CM | POA: Diagnosis not present

## 2020-06-02 DIAGNOSIS — B9689 Other specified bacterial agents as the cause of diseases classified elsewhere: Secondary | ICD-10-CM | POA: Diagnosis not present

## 2020-06-02 DIAGNOSIS — I69318 Other symptoms and signs involving cognitive functions following cerebral infarction: Secondary | ICD-10-CM | POA: Diagnosis not present

## 2020-06-02 DIAGNOSIS — T8149XD Infection following a procedure, other surgical site, subsequent encounter: Secondary | ICD-10-CM | POA: Diagnosis not present

## 2020-06-02 DIAGNOSIS — I25709 Atherosclerosis of coronary artery bypass graft(s), unspecified, with unspecified angina pectoris: Secondary | ICD-10-CM | POA: Diagnosis not present

## 2020-06-03 DIAGNOSIS — B9689 Other specified bacterial agents as the cause of diseases classified elsewhere: Secondary | ICD-10-CM | POA: Diagnosis not present

## 2020-06-03 DIAGNOSIS — I69318 Other symptoms and signs involving cognitive functions following cerebral infarction: Secondary | ICD-10-CM | POA: Diagnosis not present

## 2020-06-03 DIAGNOSIS — M60051 Infective myositis, right thigh: Secondary | ICD-10-CM | POA: Diagnosis not present

## 2020-06-03 DIAGNOSIS — T8149XD Infection following a procedure, other surgical site, subsequent encounter: Secondary | ICD-10-CM | POA: Diagnosis not present

## 2020-06-03 DIAGNOSIS — I25709 Atherosclerosis of coronary artery bypass graft(s), unspecified, with unspecified angina pectoris: Secondary | ICD-10-CM | POA: Diagnosis not present

## 2020-06-03 DIAGNOSIS — E43 Unspecified severe protein-calorie malnutrition: Secondary | ICD-10-CM | POA: Diagnosis not present

## 2020-06-04 DIAGNOSIS — B9689 Other specified bacterial agents as the cause of diseases classified elsewhere: Secondary | ICD-10-CM | POA: Diagnosis not present

## 2020-06-04 DIAGNOSIS — I25709 Atherosclerosis of coronary artery bypass graft(s), unspecified, with unspecified angina pectoris: Secondary | ICD-10-CM | POA: Diagnosis not present

## 2020-06-04 DIAGNOSIS — T8149XD Infection following a procedure, other surgical site, subsequent encounter: Secondary | ICD-10-CM | POA: Diagnosis not present

## 2020-06-04 DIAGNOSIS — I69318 Other symptoms and signs involving cognitive functions following cerebral infarction: Secondary | ICD-10-CM | POA: Diagnosis not present

## 2020-06-04 DIAGNOSIS — M60051 Infective myositis, right thigh: Secondary | ICD-10-CM | POA: Diagnosis not present

## 2020-06-04 DIAGNOSIS — E43 Unspecified severe protein-calorie malnutrition: Secondary | ICD-10-CM | POA: Diagnosis not present

## 2020-06-05 DIAGNOSIS — I69318 Other symptoms and signs involving cognitive functions following cerebral infarction: Secondary | ICD-10-CM | POA: Diagnosis not present

## 2020-06-05 DIAGNOSIS — I25709 Atherosclerosis of coronary artery bypass graft(s), unspecified, with unspecified angina pectoris: Secondary | ICD-10-CM | POA: Diagnosis not present

## 2020-06-05 DIAGNOSIS — M60051 Infective myositis, right thigh: Secondary | ICD-10-CM | POA: Diagnosis not present

## 2020-06-05 DIAGNOSIS — E43 Unspecified severe protein-calorie malnutrition: Secondary | ICD-10-CM | POA: Diagnosis not present

## 2020-06-05 DIAGNOSIS — B9689 Other specified bacterial agents as the cause of diseases classified elsewhere: Secondary | ICD-10-CM | POA: Diagnosis not present

## 2020-06-05 DIAGNOSIS — T8149XD Infection following a procedure, other surgical site, subsequent encounter: Secondary | ICD-10-CM | POA: Diagnosis not present

## 2020-06-06 DIAGNOSIS — B9689 Other specified bacterial agents as the cause of diseases classified elsewhere: Secondary | ICD-10-CM | POA: Diagnosis not present

## 2020-06-06 DIAGNOSIS — I69318 Other symptoms and signs involving cognitive functions following cerebral infarction: Secondary | ICD-10-CM | POA: Diagnosis not present

## 2020-06-06 DIAGNOSIS — I25709 Atherosclerosis of coronary artery bypass graft(s), unspecified, with unspecified angina pectoris: Secondary | ICD-10-CM | POA: Diagnosis not present

## 2020-06-06 DIAGNOSIS — M60051 Infective myositis, right thigh: Secondary | ICD-10-CM | POA: Diagnosis not present

## 2020-06-06 DIAGNOSIS — E43 Unspecified severe protein-calorie malnutrition: Secondary | ICD-10-CM | POA: Diagnosis not present

## 2020-06-06 DIAGNOSIS — T8149XD Infection following a procedure, other surgical site, subsequent encounter: Secondary | ICD-10-CM | POA: Diagnosis not present

## 2020-06-09 DIAGNOSIS — I25709 Atherosclerosis of coronary artery bypass graft(s), unspecified, with unspecified angina pectoris: Secondary | ICD-10-CM | POA: Diagnosis not present

## 2020-06-09 DIAGNOSIS — T8149XD Infection following a procedure, other surgical site, subsequent encounter: Secondary | ICD-10-CM | POA: Diagnosis not present

## 2020-06-09 DIAGNOSIS — B9689 Other specified bacterial agents as the cause of diseases classified elsewhere: Secondary | ICD-10-CM | POA: Diagnosis not present

## 2020-06-09 DIAGNOSIS — E43 Unspecified severe protein-calorie malnutrition: Secondary | ICD-10-CM | POA: Diagnosis not present

## 2020-06-09 DIAGNOSIS — M60051 Infective myositis, right thigh: Secondary | ICD-10-CM | POA: Diagnosis not present

## 2020-06-09 DIAGNOSIS — I69318 Other symptoms and signs involving cognitive functions following cerebral infarction: Secondary | ICD-10-CM | POA: Diagnosis not present

## 2020-06-10 DIAGNOSIS — E43 Unspecified severe protein-calorie malnutrition: Secondary | ICD-10-CM | POA: Diagnosis not present

## 2020-06-10 DIAGNOSIS — T8149XD Infection following a procedure, other surgical site, subsequent encounter: Secondary | ICD-10-CM | POA: Diagnosis not present

## 2020-06-10 DIAGNOSIS — I69318 Other symptoms and signs involving cognitive functions following cerebral infarction: Secondary | ICD-10-CM | POA: Diagnosis not present

## 2020-06-10 DIAGNOSIS — M60051 Infective myositis, right thigh: Secondary | ICD-10-CM | POA: Diagnosis not present

## 2020-06-10 DIAGNOSIS — B9689 Other specified bacterial agents as the cause of diseases classified elsewhere: Secondary | ICD-10-CM | POA: Diagnosis not present

## 2020-06-10 DIAGNOSIS — I25709 Atherosclerosis of coronary artery bypass graft(s), unspecified, with unspecified angina pectoris: Secondary | ICD-10-CM | POA: Diagnosis not present

## 2020-06-11 DIAGNOSIS — L89152 Pressure ulcer of sacral region, stage 2: Secondary | ICD-10-CM | POA: Diagnosis not present

## 2020-06-11 DIAGNOSIS — R159 Full incontinence of feces: Secondary | ICD-10-CM | POA: Diagnosis not present

## 2020-06-11 DIAGNOSIS — E878 Other disorders of electrolyte and fluid balance, not elsewhere classified: Secondary | ICD-10-CM | POA: Diagnosis not present

## 2020-06-11 DIAGNOSIS — B9689 Other specified bacterial agents as the cause of diseases classified elsewhere: Secondary | ICD-10-CM | POA: Diagnosis not present

## 2020-06-11 DIAGNOSIS — D500 Iron deficiency anemia secondary to blood loss (chronic): Secondary | ICD-10-CM | POA: Diagnosis not present

## 2020-06-11 DIAGNOSIS — R627 Adult failure to thrive: Secondary | ICD-10-CM | POA: Diagnosis not present

## 2020-06-11 DIAGNOSIS — I69318 Other symptoms and signs involving cognitive functions following cerebral infarction: Secondary | ICD-10-CM | POA: Diagnosis not present

## 2020-06-11 DIAGNOSIS — I10 Essential (primary) hypertension: Secondary | ICD-10-CM | POA: Diagnosis not present

## 2020-06-11 DIAGNOSIS — E43 Unspecified severe protein-calorie malnutrition: Secondary | ICD-10-CM | POA: Diagnosis not present

## 2020-06-11 DIAGNOSIS — M60051 Infective myositis, right thigh: Secondary | ICD-10-CM | POA: Diagnosis not present

## 2020-06-11 DIAGNOSIS — Z6827 Body mass index (BMI) 27.0-27.9, adult: Secondary | ICD-10-CM | POA: Diagnosis not present

## 2020-06-11 DIAGNOSIS — I25709 Atherosclerosis of coronary artery bypass graft(s), unspecified, with unspecified angina pectoris: Secondary | ICD-10-CM | POA: Diagnosis not present

## 2020-06-11 DIAGNOSIS — E041 Nontoxic single thyroid nodule: Secondary | ICD-10-CM | POA: Diagnosis not present

## 2020-06-11 DIAGNOSIS — E119 Type 2 diabetes mellitus without complications: Secondary | ICD-10-CM | POA: Diagnosis not present

## 2020-06-11 DIAGNOSIS — E785 Hyperlipidemia, unspecified: Secondary | ICD-10-CM | POA: Diagnosis not present

## 2020-06-11 DIAGNOSIS — T8149XD Infection following a procedure, other surgical site, subsequent encounter: Secondary | ICD-10-CM | POA: Diagnosis not present

## 2020-06-11 DIAGNOSIS — R32 Unspecified urinary incontinence: Secondary | ICD-10-CM | POA: Diagnosis not present

## 2020-06-11 DIAGNOSIS — J302 Other seasonal allergic rhinitis: Secondary | ICD-10-CM | POA: Diagnosis not present

## 2020-06-12 DIAGNOSIS — E43 Unspecified severe protein-calorie malnutrition: Secondary | ICD-10-CM | POA: Diagnosis not present

## 2020-06-12 DIAGNOSIS — B9689 Other specified bacterial agents as the cause of diseases classified elsewhere: Secondary | ICD-10-CM | POA: Diagnosis not present

## 2020-06-12 DIAGNOSIS — I25709 Atherosclerosis of coronary artery bypass graft(s), unspecified, with unspecified angina pectoris: Secondary | ICD-10-CM | POA: Diagnosis not present

## 2020-06-12 DIAGNOSIS — T8149XD Infection following a procedure, other surgical site, subsequent encounter: Secondary | ICD-10-CM | POA: Diagnosis not present

## 2020-06-12 DIAGNOSIS — M60051 Infective myositis, right thigh: Secondary | ICD-10-CM | POA: Diagnosis not present

## 2020-06-12 DIAGNOSIS — I69318 Other symptoms and signs involving cognitive functions following cerebral infarction: Secondary | ICD-10-CM | POA: Diagnosis not present

## 2020-06-13 DIAGNOSIS — M60051 Infective myositis, right thigh: Secondary | ICD-10-CM | POA: Diagnosis not present

## 2020-06-13 DIAGNOSIS — I69318 Other symptoms and signs involving cognitive functions following cerebral infarction: Secondary | ICD-10-CM | POA: Diagnosis not present

## 2020-06-13 DIAGNOSIS — T8149XD Infection following a procedure, other surgical site, subsequent encounter: Secondary | ICD-10-CM | POA: Diagnosis not present

## 2020-06-13 DIAGNOSIS — B9689 Other specified bacterial agents as the cause of diseases classified elsewhere: Secondary | ICD-10-CM | POA: Diagnosis not present

## 2020-06-13 DIAGNOSIS — E43 Unspecified severe protein-calorie malnutrition: Secondary | ICD-10-CM | POA: Diagnosis not present

## 2020-06-13 DIAGNOSIS — I25709 Atherosclerosis of coronary artery bypass graft(s), unspecified, with unspecified angina pectoris: Secondary | ICD-10-CM | POA: Diagnosis not present

## 2020-06-16 DIAGNOSIS — T8149XD Infection following a procedure, other surgical site, subsequent encounter: Secondary | ICD-10-CM | POA: Diagnosis not present

## 2020-06-16 DIAGNOSIS — I69318 Other symptoms and signs involving cognitive functions following cerebral infarction: Secondary | ICD-10-CM | POA: Diagnosis not present

## 2020-06-16 DIAGNOSIS — M60051 Infective myositis, right thigh: Secondary | ICD-10-CM | POA: Diagnosis not present

## 2020-06-16 DIAGNOSIS — I25709 Atherosclerosis of coronary artery bypass graft(s), unspecified, with unspecified angina pectoris: Secondary | ICD-10-CM | POA: Diagnosis not present

## 2020-06-16 DIAGNOSIS — B9689 Other specified bacterial agents as the cause of diseases classified elsewhere: Secondary | ICD-10-CM | POA: Diagnosis not present

## 2020-06-16 DIAGNOSIS — E43 Unspecified severe protein-calorie malnutrition: Secondary | ICD-10-CM | POA: Diagnosis not present

## 2020-06-17 DIAGNOSIS — B9689 Other specified bacterial agents as the cause of diseases classified elsewhere: Secondary | ICD-10-CM | POA: Diagnosis not present

## 2020-06-17 DIAGNOSIS — E43 Unspecified severe protein-calorie malnutrition: Secondary | ICD-10-CM | POA: Diagnosis not present

## 2020-06-17 DIAGNOSIS — T8149XD Infection following a procedure, other surgical site, subsequent encounter: Secondary | ICD-10-CM | POA: Diagnosis not present

## 2020-06-17 DIAGNOSIS — M60051 Infective myositis, right thigh: Secondary | ICD-10-CM | POA: Diagnosis not present

## 2020-06-17 DIAGNOSIS — I25709 Atherosclerosis of coronary artery bypass graft(s), unspecified, with unspecified angina pectoris: Secondary | ICD-10-CM | POA: Diagnosis not present

## 2020-06-17 DIAGNOSIS — I69318 Other symptoms and signs involving cognitive functions following cerebral infarction: Secondary | ICD-10-CM | POA: Diagnosis not present

## 2020-06-18 DIAGNOSIS — E43 Unspecified severe protein-calorie malnutrition: Secondary | ICD-10-CM | POA: Diagnosis not present

## 2020-06-18 DIAGNOSIS — M60051 Infective myositis, right thigh: Secondary | ICD-10-CM | POA: Diagnosis not present

## 2020-06-18 DIAGNOSIS — I25709 Atherosclerosis of coronary artery bypass graft(s), unspecified, with unspecified angina pectoris: Secondary | ICD-10-CM | POA: Diagnosis not present

## 2020-06-18 DIAGNOSIS — T8149XD Infection following a procedure, other surgical site, subsequent encounter: Secondary | ICD-10-CM | POA: Diagnosis not present

## 2020-06-18 DIAGNOSIS — I69318 Other symptoms and signs involving cognitive functions following cerebral infarction: Secondary | ICD-10-CM | POA: Diagnosis not present

## 2020-06-18 DIAGNOSIS — B9689 Other specified bacterial agents as the cause of diseases classified elsewhere: Secondary | ICD-10-CM | POA: Diagnosis not present

## 2020-06-19 DIAGNOSIS — I69318 Other symptoms and signs involving cognitive functions following cerebral infarction: Secondary | ICD-10-CM | POA: Diagnosis not present

## 2020-06-19 DIAGNOSIS — B9689 Other specified bacterial agents as the cause of diseases classified elsewhere: Secondary | ICD-10-CM | POA: Diagnosis not present

## 2020-06-19 DIAGNOSIS — I25709 Atherosclerosis of coronary artery bypass graft(s), unspecified, with unspecified angina pectoris: Secondary | ICD-10-CM | POA: Diagnosis not present

## 2020-06-19 DIAGNOSIS — E43 Unspecified severe protein-calorie malnutrition: Secondary | ICD-10-CM | POA: Diagnosis not present

## 2020-06-19 DIAGNOSIS — T8149XD Infection following a procedure, other surgical site, subsequent encounter: Secondary | ICD-10-CM | POA: Diagnosis not present

## 2020-06-19 DIAGNOSIS — M60051 Infective myositis, right thigh: Secondary | ICD-10-CM | POA: Diagnosis not present

## 2020-06-20 DIAGNOSIS — B9689 Other specified bacterial agents as the cause of diseases classified elsewhere: Secondary | ICD-10-CM | POA: Diagnosis not present

## 2020-06-20 DIAGNOSIS — I25709 Atherosclerosis of coronary artery bypass graft(s), unspecified, with unspecified angina pectoris: Secondary | ICD-10-CM | POA: Diagnosis not present

## 2020-06-20 DIAGNOSIS — M60051 Infective myositis, right thigh: Secondary | ICD-10-CM | POA: Diagnosis not present

## 2020-06-20 DIAGNOSIS — I69318 Other symptoms and signs involving cognitive functions following cerebral infarction: Secondary | ICD-10-CM | POA: Diagnosis not present

## 2020-06-20 DIAGNOSIS — T8149XD Infection following a procedure, other surgical site, subsequent encounter: Secondary | ICD-10-CM | POA: Diagnosis not present

## 2020-06-20 DIAGNOSIS — E43 Unspecified severe protein-calorie malnutrition: Secondary | ICD-10-CM | POA: Diagnosis not present

## 2020-06-23 DIAGNOSIS — T8149XD Infection following a procedure, other surgical site, subsequent encounter: Secondary | ICD-10-CM | POA: Diagnosis not present

## 2020-06-23 DIAGNOSIS — E43 Unspecified severe protein-calorie malnutrition: Secondary | ICD-10-CM | POA: Diagnosis not present

## 2020-06-23 DIAGNOSIS — M60051 Infective myositis, right thigh: Secondary | ICD-10-CM | POA: Diagnosis not present

## 2020-06-23 DIAGNOSIS — I25709 Atherosclerosis of coronary artery bypass graft(s), unspecified, with unspecified angina pectoris: Secondary | ICD-10-CM | POA: Diagnosis not present

## 2020-06-23 DIAGNOSIS — I69318 Other symptoms and signs involving cognitive functions following cerebral infarction: Secondary | ICD-10-CM | POA: Diagnosis not present

## 2020-06-23 DIAGNOSIS — B9689 Other specified bacterial agents as the cause of diseases classified elsewhere: Secondary | ICD-10-CM | POA: Diagnosis not present

## 2020-06-24 DIAGNOSIS — E43 Unspecified severe protein-calorie malnutrition: Secondary | ICD-10-CM | POA: Diagnosis not present

## 2020-06-24 DIAGNOSIS — M60051 Infective myositis, right thigh: Secondary | ICD-10-CM | POA: Diagnosis not present

## 2020-06-24 DIAGNOSIS — I25709 Atherosclerosis of coronary artery bypass graft(s), unspecified, with unspecified angina pectoris: Secondary | ICD-10-CM | POA: Diagnosis not present

## 2020-06-24 DIAGNOSIS — B9689 Other specified bacterial agents as the cause of diseases classified elsewhere: Secondary | ICD-10-CM | POA: Diagnosis not present

## 2020-06-24 DIAGNOSIS — I69318 Other symptoms and signs involving cognitive functions following cerebral infarction: Secondary | ICD-10-CM | POA: Diagnosis not present

## 2020-06-24 DIAGNOSIS — T8149XD Infection following a procedure, other surgical site, subsequent encounter: Secondary | ICD-10-CM | POA: Diagnosis not present

## 2020-06-25 DIAGNOSIS — I25709 Atherosclerosis of coronary artery bypass graft(s), unspecified, with unspecified angina pectoris: Secondary | ICD-10-CM | POA: Diagnosis not present

## 2020-06-25 DIAGNOSIS — E43 Unspecified severe protein-calorie malnutrition: Secondary | ICD-10-CM | POA: Diagnosis not present

## 2020-06-25 DIAGNOSIS — B9689 Other specified bacterial agents as the cause of diseases classified elsewhere: Secondary | ICD-10-CM | POA: Diagnosis not present

## 2020-06-25 DIAGNOSIS — I69318 Other symptoms and signs involving cognitive functions following cerebral infarction: Secondary | ICD-10-CM | POA: Diagnosis not present

## 2020-06-25 DIAGNOSIS — T8149XD Infection following a procedure, other surgical site, subsequent encounter: Secondary | ICD-10-CM | POA: Diagnosis not present

## 2020-06-25 DIAGNOSIS — M60051 Infective myositis, right thigh: Secondary | ICD-10-CM | POA: Diagnosis not present

## 2020-06-26 DIAGNOSIS — I25709 Atherosclerosis of coronary artery bypass graft(s), unspecified, with unspecified angina pectoris: Secondary | ICD-10-CM | POA: Diagnosis not present

## 2020-06-26 DIAGNOSIS — M60051 Infective myositis, right thigh: Secondary | ICD-10-CM | POA: Diagnosis not present

## 2020-06-26 DIAGNOSIS — E43 Unspecified severe protein-calorie malnutrition: Secondary | ICD-10-CM | POA: Diagnosis not present

## 2020-06-26 DIAGNOSIS — B9689 Other specified bacterial agents as the cause of diseases classified elsewhere: Secondary | ICD-10-CM | POA: Diagnosis not present

## 2020-06-26 DIAGNOSIS — T8149XD Infection following a procedure, other surgical site, subsequent encounter: Secondary | ICD-10-CM | POA: Diagnosis not present

## 2020-06-26 DIAGNOSIS — I69318 Other symptoms and signs involving cognitive functions following cerebral infarction: Secondary | ICD-10-CM | POA: Diagnosis not present

## 2020-06-27 DIAGNOSIS — E43 Unspecified severe protein-calorie malnutrition: Secondary | ICD-10-CM | POA: Diagnosis not present

## 2020-06-27 DIAGNOSIS — I69318 Other symptoms and signs involving cognitive functions following cerebral infarction: Secondary | ICD-10-CM | POA: Diagnosis not present

## 2020-06-27 DIAGNOSIS — M60051 Infective myositis, right thigh: Secondary | ICD-10-CM | POA: Diagnosis not present

## 2020-06-27 DIAGNOSIS — I25709 Atherosclerosis of coronary artery bypass graft(s), unspecified, with unspecified angina pectoris: Secondary | ICD-10-CM | POA: Diagnosis not present

## 2020-06-27 DIAGNOSIS — T8149XD Infection following a procedure, other surgical site, subsequent encounter: Secondary | ICD-10-CM | POA: Diagnosis not present

## 2020-06-27 DIAGNOSIS — B9689 Other specified bacterial agents as the cause of diseases classified elsewhere: Secondary | ICD-10-CM | POA: Diagnosis not present

## 2020-06-28 DIAGNOSIS — R627 Adult failure to thrive: Secondary | ICD-10-CM | POA: Diagnosis not present

## 2020-06-28 DIAGNOSIS — I5030 Unspecified diastolic (congestive) heart failure: Secondary | ICD-10-CM | POA: Diagnosis not present

## 2020-06-28 DIAGNOSIS — I10 Essential (primary) hypertension: Secondary | ICD-10-CM | POA: Diagnosis not present

## 2020-06-28 DIAGNOSIS — L8990 Pressure ulcer of unspecified site, unspecified stage: Secondary | ICD-10-CM | POA: Diagnosis not present

## 2020-06-30 DIAGNOSIS — E43 Unspecified severe protein-calorie malnutrition: Secondary | ICD-10-CM | POA: Diagnosis not present

## 2020-06-30 DIAGNOSIS — B9689 Other specified bacterial agents as the cause of diseases classified elsewhere: Secondary | ICD-10-CM | POA: Diagnosis not present

## 2020-06-30 DIAGNOSIS — I69318 Other symptoms and signs involving cognitive functions following cerebral infarction: Secondary | ICD-10-CM | POA: Diagnosis not present

## 2020-06-30 DIAGNOSIS — I25709 Atherosclerosis of coronary artery bypass graft(s), unspecified, with unspecified angina pectoris: Secondary | ICD-10-CM | POA: Diagnosis not present

## 2020-06-30 DIAGNOSIS — M60051 Infective myositis, right thigh: Secondary | ICD-10-CM | POA: Diagnosis not present

## 2020-06-30 DIAGNOSIS — T8149XD Infection following a procedure, other surgical site, subsequent encounter: Secondary | ICD-10-CM | POA: Diagnosis not present

## 2020-07-01 DIAGNOSIS — B9689 Other specified bacterial agents as the cause of diseases classified elsewhere: Secondary | ICD-10-CM | POA: Diagnosis not present

## 2020-07-01 DIAGNOSIS — I69318 Other symptoms and signs involving cognitive functions following cerebral infarction: Secondary | ICD-10-CM | POA: Diagnosis not present

## 2020-07-01 DIAGNOSIS — T8149XD Infection following a procedure, other surgical site, subsequent encounter: Secondary | ICD-10-CM | POA: Diagnosis not present

## 2020-07-01 DIAGNOSIS — I25709 Atherosclerosis of coronary artery bypass graft(s), unspecified, with unspecified angina pectoris: Secondary | ICD-10-CM | POA: Diagnosis not present

## 2020-07-01 DIAGNOSIS — M60051 Infective myositis, right thigh: Secondary | ICD-10-CM | POA: Diagnosis not present

## 2020-07-01 DIAGNOSIS — E43 Unspecified severe protein-calorie malnutrition: Secondary | ICD-10-CM | POA: Diagnosis not present

## 2020-07-02 DIAGNOSIS — I25709 Atherosclerosis of coronary artery bypass graft(s), unspecified, with unspecified angina pectoris: Secondary | ICD-10-CM | POA: Diagnosis not present

## 2020-07-02 DIAGNOSIS — T8149XD Infection following a procedure, other surgical site, subsequent encounter: Secondary | ICD-10-CM | POA: Diagnosis not present

## 2020-07-02 DIAGNOSIS — I69318 Other symptoms and signs involving cognitive functions following cerebral infarction: Secondary | ICD-10-CM | POA: Diagnosis not present

## 2020-07-02 DIAGNOSIS — B9689 Other specified bacterial agents as the cause of diseases classified elsewhere: Secondary | ICD-10-CM | POA: Diagnosis not present

## 2020-07-02 DIAGNOSIS — M60051 Infective myositis, right thigh: Secondary | ICD-10-CM | POA: Diagnosis not present

## 2020-07-02 DIAGNOSIS — E43 Unspecified severe protein-calorie malnutrition: Secondary | ICD-10-CM | POA: Diagnosis not present

## 2020-07-03 DIAGNOSIS — B9689 Other specified bacterial agents as the cause of diseases classified elsewhere: Secondary | ICD-10-CM | POA: Diagnosis not present

## 2020-07-03 DIAGNOSIS — T8149XD Infection following a procedure, other surgical site, subsequent encounter: Secondary | ICD-10-CM | POA: Diagnosis not present

## 2020-07-03 DIAGNOSIS — E43 Unspecified severe protein-calorie malnutrition: Secondary | ICD-10-CM | POA: Diagnosis not present

## 2020-07-03 DIAGNOSIS — M60051 Infective myositis, right thigh: Secondary | ICD-10-CM | POA: Diagnosis not present

## 2020-07-03 DIAGNOSIS — I25709 Atherosclerosis of coronary artery bypass graft(s), unspecified, with unspecified angina pectoris: Secondary | ICD-10-CM | POA: Diagnosis not present

## 2020-07-03 DIAGNOSIS — I69318 Other symptoms and signs involving cognitive functions following cerebral infarction: Secondary | ICD-10-CM | POA: Diagnosis not present

## 2020-07-04 DIAGNOSIS — E43 Unspecified severe protein-calorie malnutrition: Secondary | ICD-10-CM | POA: Diagnosis not present

## 2020-07-04 DIAGNOSIS — I25709 Atherosclerosis of coronary artery bypass graft(s), unspecified, with unspecified angina pectoris: Secondary | ICD-10-CM | POA: Diagnosis not present

## 2020-07-04 DIAGNOSIS — I69318 Other symptoms and signs involving cognitive functions following cerebral infarction: Secondary | ICD-10-CM | POA: Diagnosis not present

## 2020-07-04 DIAGNOSIS — M60051 Infective myositis, right thigh: Secondary | ICD-10-CM | POA: Diagnosis not present

## 2020-07-04 DIAGNOSIS — B9689 Other specified bacterial agents as the cause of diseases classified elsewhere: Secondary | ICD-10-CM | POA: Diagnosis not present

## 2020-07-04 DIAGNOSIS — T8149XD Infection following a procedure, other surgical site, subsequent encounter: Secondary | ICD-10-CM | POA: Diagnosis not present

## 2020-07-07 DIAGNOSIS — T8149XD Infection following a procedure, other surgical site, subsequent encounter: Secondary | ICD-10-CM | POA: Diagnosis not present

## 2020-07-07 DIAGNOSIS — E43 Unspecified severe protein-calorie malnutrition: Secondary | ICD-10-CM | POA: Diagnosis not present

## 2020-07-07 DIAGNOSIS — I69318 Other symptoms and signs involving cognitive functions following cerebral infarction: Secondary | ICD-10-CM | POA: Diagnosis not present

## 2020-07-07 DIAGNOSIS — M60051 Infective myositis, right thigh: Secondary | ICD-10-CM | POA: Diagnosis not present

## 2020-07-07 DIAGNOSIS — B9689 Other specified bacterial agents as the cause of diseases classified elsewhere: Secondary | ICD-10-CM | POA: Diagnosis not present

## 2020-07-07 DIAGNOSIS — I25709 Atherosclerosis of coronary artery bypass graft(s), unspecified, with unspecified angina pectoris: Secondary | ICD-10-CM | POA: Diagnosis not present

## 2020-07-08 DIAGNOSIS — T8149XD Infection following a procedure, other surgical site, subsequent encounter: Secondary | ICD-10-CM | POA: Diagnosis not present

## 2020-07-08 DIAGNOSIS — M60051 Infective myositis, right thigh: Secondary | ICD-10-CM | POA: Diagnosis not present

## 2020-07-08 DIAGNOSIS — I69318 Other symptoms and signs involving cognitive functions following cerebral infarction: Secondary | ICD-10-CM | POA: Diagnosis not present

## 2020-07-08 DIAGNOSIS — E43 Unspecified severe protein-calorie malnutrition: Secondary | ICD-10-CM | POA: Diagnosis not present

## 2020-07-08 DIAGNOSIS — I25709 Atherosclerosis of coronary artery bypass graft(s), unspecified, with unspecified angina pectoris: Secondary | ICD-10-CM | POA: Diagnosis not present

## 2020-07-08 DIAGNOSIS — B9689 Other specified bacterial agents as the cause of diseases classified elsewhere: Secondary | ICD-10-CM | POA: Diagnosis not present

## 2020-07-09 DIAGNOSIS — T8149XD Infection following a procedure, other surgical site, subsequent encounter: Secondary | ICD-10-CM | POA: Diagnosis not present

## 2020-07-09 DIAGNOSIS — I25709 Atherosclerosis of coronary artery bypass graft(s), unspecified, with unspecified angina pectoris: Secondary | ICD-10-CM | POA: Diagnosis not present

## 2020-07-09 DIAGNOSIS — E43 Unspecified severe protein-calorie malnutrition: Secondary | ICD-10-CM | POA: Diagnosis not present

## 2020-07-09 DIAGNOSIS — M60051 Infective myositis, right thigh: Secondary | ICD-10-CM | POA: Diagnosis not present

## 2020-07-09 DIAGNOSIS — B9689 Other specified bacterial agents as the cause of diseases classified elsewhere: Secondary | ICD-10-CM | POA: Diagnosis not present

## 2020-07-09 DIAGNOSIS — I69318 Other symptoms and signs involving cognitive functions following cerebral infarction: Secondary | ICD-10-CM | POA: Diagnosis not present

## 2020-07-10 DIAGNOSIS — T8149XD Infection following a procedure, other surgical site, subsequent encounter: Secondary | ICD-10-CM | POA: Diagnosis not present

## 2020-07-10 DIAGNOSIS — I69318 Other symptoms and signs involving cognitive functions following cerebral infarction: Secondary | ICD-10-CM | POA: Diagnosis not present

## 2020-07-10 DIAGNOSIS — I25709 Atherosclerosis of coronary artery bypass graft(s), unspecified, with unspecified angina pectoris: Secondary | ICD-10-CM | POA: Diagnosis not present

## 2020-07-10 DIAGNOSIS — E43 Unspecified severe protein-calorie malnutrition: Secondary | ICD-10-CM | POA: Diagnosis not present

## 2020-07-10 DIAGNOSIS — B9689 Other specified bacterial agents as the cause of diseases classified elsewhere: Secondary | ICD-10-CM | POA: Diagnosis not present

## 2020-07-10 DIAGNOSIS — M60051 Infective myositis, right thigh: Secondary | ICD-10-CM | POA: Diagnosis not present

## 2020-07-11 DIAGNOSIS — T8149XD Infection following a procedure, other surgical site, subsequent encounter: Secondary | ICD-10-CM | POA: Diagnosis not present

## 2020-07-11 DIAGNOSIS — R32 Unspecified urinary incontinence: Secondary | ICD-10-CM | POA: Diagnosis not present

## 2020-07-11 DIAGNOSIS — E119 Type 2 diabetes mellitus without complications: Secondary | ICD-10-CM | POA: Diagnosis not present

## 2020-07-11 DIAGNOSIS — I69318 Other symptoms and signs involving cognitive functions following cerebral infarction: Secondary | ICD-10-CM | POA: Diagnosis not present

## 2020-07-11 DIAGNOSIS — I25709 Atherosclerosis of coronary artery bypass graft(s), unspecified, with unspecified angina pectoris: Secondary | ICD-10-CM | POA: Diagnosis not present

## 2020-07-11 DIAGNOSIS — E041 Nontoxic single thyroid nodule: Secondary | ICD-10-CM | POA: Diagnosis not present

## 2020-07-11 DIAGNOSIS — B9689 Other specified bacterial agents as the cause of diseases classified elsewhere: Secondary | ICD-10-CM | POA: Diagnosis not present

## 2020-07-11 DIAGNOSIS — M60051 Infective myositis, right thigh: Secondary | ICD-10-CM | POA: Diagnosis not present

## 2020-07-11 DIAGNOSIS — D500 Iron deficiency anemia secondary to blood loss (chronic): Secondary | ICD-10-CM | POA: Diagnosis not present

## 2020-07-11 DIAGNOSIS — Z6827 Body mass index (BMI) 27.0-27.9, adult: Secondary | ICD-10-CM | POA: Diagnosis not present

## 2020-07-11 DIAGNOSIS — R159 Full incontinence of feces: Secondary | ICD-10-CM | POA: Diagnosis not present

## 2020-07-11 DIAGNOSIS — E43 Unspecified severe protein-calorie malnutrition: Secondary | ICD-10-CM | POA: Diagnosis not present

## 2020-07-11 DIAGNOSIS — J302 Other seasonal allergic rhinitis: Secondary | ICD-10-CM | POA: Diagnosis not present

## 2020-07-11 DIAGNOSIS — E878 Other disorders of electrolyte and fluid balance, not elsewhere classified: Secondary | ICD-10-CM | POA: Diagnosis not present

## 2020-07-11 DIAGNOSIS — R627 Adult failure to thrive: Secondary | ICD-10-CM | POA: Diagnosis not present

## 2020-07-11 DIAGNOSIS — I10 Essential (primary) hypertension: Secondary | ICD-10-CM | POA: Diagnosis not present

## 2020-07-11 DIAGNOSIS — E785 Hyperlipidemia, unspecified: Secondary | ICD-10-CM | POA: Diagnosis not present

## 2020-07-11 DIAGNOSIS — L89152 Pressure ulcer of sacral region, stage 2: Secondary | ICD-10-CM | POA: Diagnosis not present

## 2020-07-13 DIAGNOSIS — I25709 Atherosclerosis of coronary artery bypass graft(s), unspecified, with unspecified angina pectoris: Secondary | ICD-10-CM | POA: Diagnosis not present

## 2020-07-13 DIAGNOSIS — E43 Unspecified severe protein-calorie malnutrition: Secondary | ICD-10-CM | POA: Diagnosis not present

## 2020-07-13 DIAGNOSIS — B9689 Other specified bacterial agents as the cause of diseases classified elsewhere: Secondary | ICD-10-CM | POA: Diagnosis not present

## 2020-07-13 DIAGNOSIS — I69318 Other symptoms and signs involving cognitive functions following cerebral infarction: Secondary | ICD-10-CM | POA: Diagnosis not present

## 2020-07-13 DIAGNOSIS — T8149XD Infection following a procedure, other surgical site, subsequent encounter: Secondary | ICD-10-CM | POA: Diagnosis not present

## 2020-07-13 DIAGNOSIS — M60051 Infective myositis, right thigh: Secondary | ICD-10-CM | POA: Diagnosis not present

## 2020-08-08 ENCOUNTER — Ambulatory Visit: Payer: Medicare Other | Admitting: Podiatry

## 2020-08-11 DEATH — deceased

## 2022-01-16 IMAGING — DX DG CHEST 1V PORT
1 series · 1 of 1 positions shown · non-contrast
Comparison: June 18, 2017

CLINICAL DATA: Dyspnea

EXAM:
PORTABLE CHEST 1 VIEW

[chest]
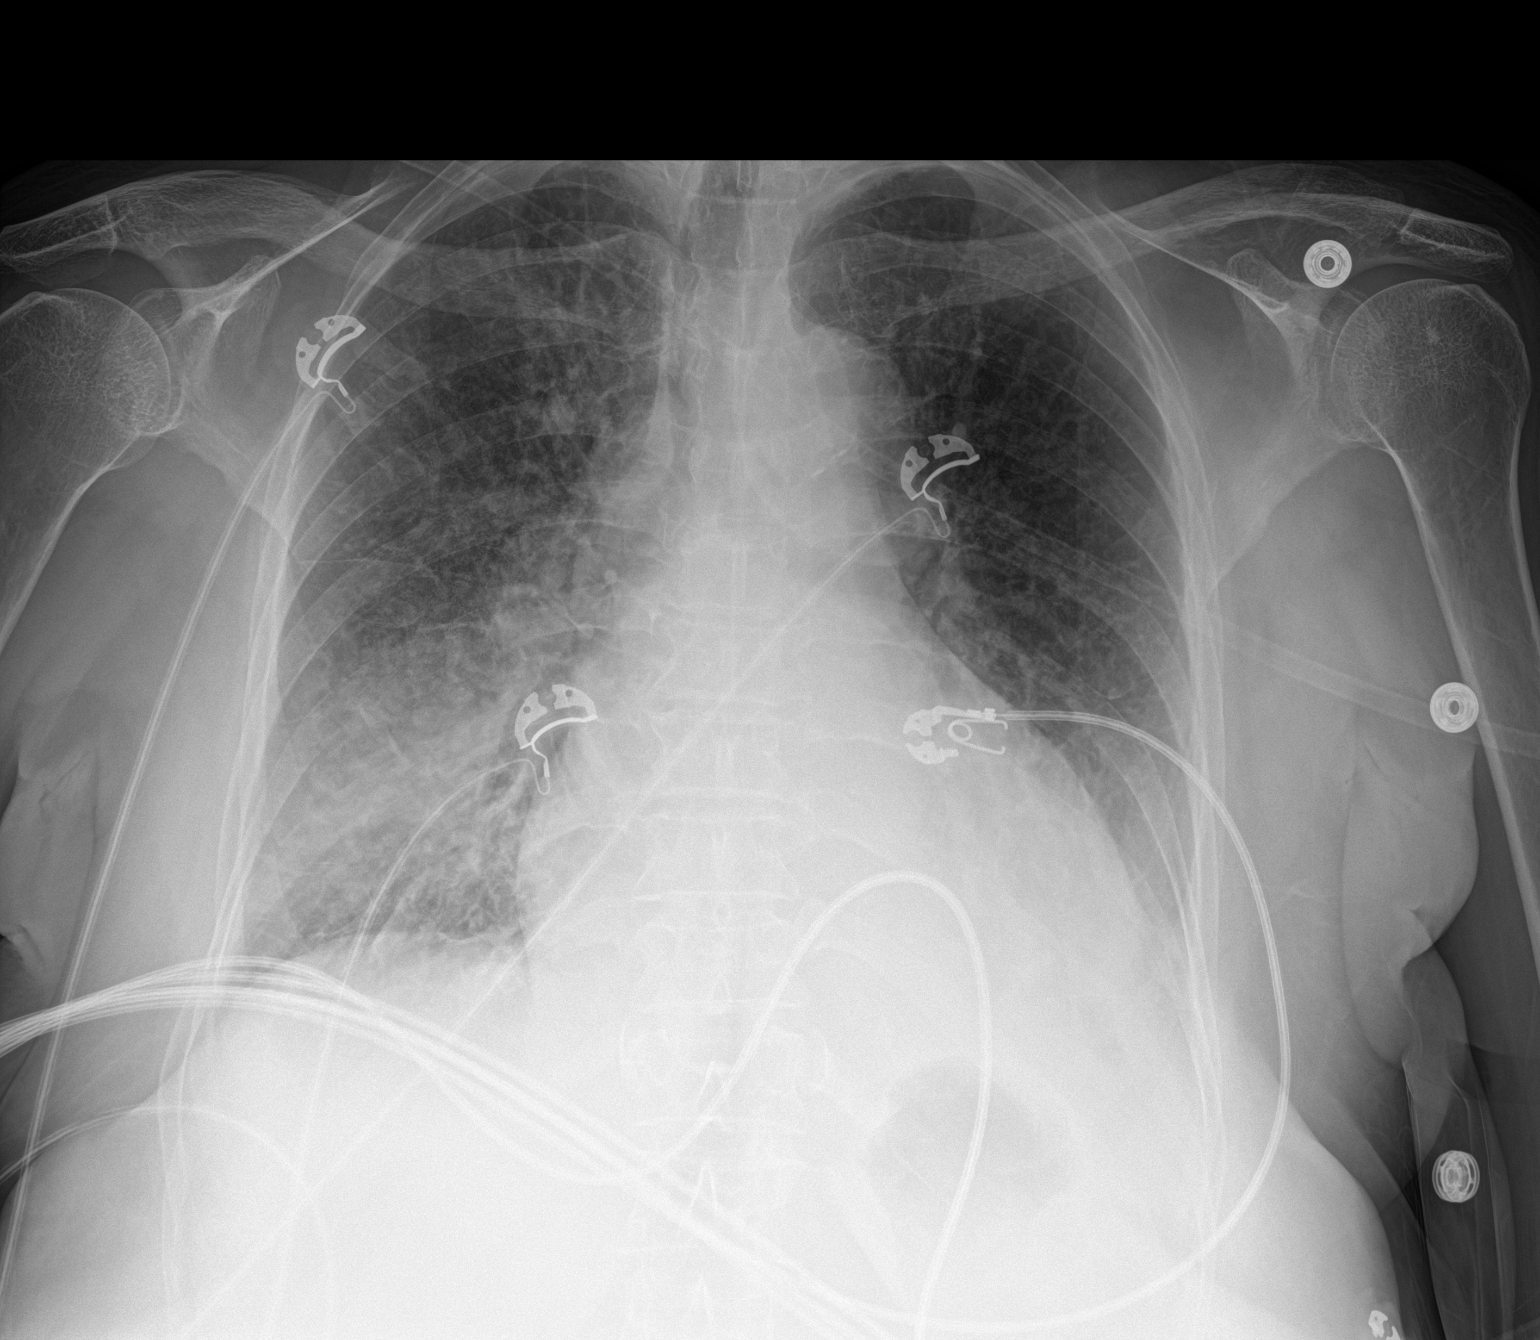

[1 of 1 positions shown; findings below may reference images not displayed]

FINDINGS: The mediastinal contour is normal. Heart size is slightly enlarged.
Patchy consolidation of both lung bases, right greater than left are
noted. Increased pulmonary interstitium is identified bilaterally.
No acute abnormality is identified in the osseous structures.
IMPRESSION: 1. Mild interstitial edema.
2. Patchy consolidation of both lung bases, right greater than left,
pneumonia is not excluded.
